# Patient Record
Sex: Male | Born: 1945 | Race: White | Hispanic: No | Marital: Married | Smoking: Former smoker
Health system: Southern US, Community
[De-identification: ages and names within clinical notes are randomized; demographics above are authoritative.]

## PROBLEM LIST (undated history)

## (undated) DIAGNOSIS — R51 Headache: Secondary | ICD-10-CM

## (undated) DIAGNOSIS — R519 Headache, unspecified: Secondary | ICD-10-CM

## (undated) DIAGNOSIS — K5730 Diverticulosis of large intestine without perforation or abscess without bleeding: Secondary | ICD-10-CM

## (undated) DIAGNOSIS — E785 Hyperlipidemia, unspecified: Secondary | ICD-10-CM

## (undated) DIAGNOSIS — M25552 Pain in left hip: Secondary | ICD-10-CM

## (undated) DIAGNOSIS — I2510 Atherosclerotic heart disease of native coronary artery without angina pectoris: Secondary | ICD-10-CM

## (undated) DIAGNOSIS — I219 Acute myocardial infarction, unspecified: Secondary | ICD-10-CM

## (undated) DIAGNOSIS — K219 Gastro-esophageal reflux disease without esophagitis: Secondary | ICD-10-CM

## (undated) DIAGNOSIS — M797 Fibromyalgia: Secondary | ICD-10-CM

## (undated) DIAGNOSIS — R42 Dizziness and giddiness: Secondary | ICD-10-CM

## (undated) DIAGNOSIS — M7918 Myalgia, other site: Secondary | ICD-10-CM

## (undated) DIAGNOSIS — I10 Essential (primary) hypertension: Secondary | ICD-10-CM

## (undated) DIAGNOSIS — K635 Polyp of colon: Secondary | ICD-10-CM

## (undated) DIAGNOSIS — C8590 Non-Hodgkin lymphoma, unspecified, unspecified site: Secondary | ICD-10-CM

## (undated) DIAGNOSIS — C9000 Multiple myeloma not having achieved remission: Secondary | ICD-10-CM

## (undated) DIAGNOSIS — J309 Allergic rhinitis, unspecified: Secondary | ICD-10-CM

## (undated) DIAGNOSIS — G8929 Other chronic pain: Secondary | ICD-10-CM

## (undated) HISTORY — DX: Headache, unspecified: R51.9

## (undated) HISTORY — PX: CARDIAC CATHETERIZATION: SHX172

## (undated) HISTORY — DX: Hyperlipidemia, unspecified: E78.5

## (undated) HISTORY — DX: Myalgia, other site: M79.18

## (undated) HISTORY — DX: Atherosclerotic heart disease of native coronary artery without angina pectoris: I25.10

## (undated) HISTORY — DX: Non-Hodgkin lymphoma, unspecified, unspecified site: C85.90

## (undated) HISTORY — PX: CATARACT EXTRACTION W/ INTRAOCULAR LENS IMPLANT: SHX1309

## (undated) HISTORY — DX: Dizziness and giddiness: R42

## (undated) HISTORY — PX: POPLITEAL SYNOVIAL CYST EXCISION: SUR555

## (undated) HISTORY — DX: Polyp of colon: K63.5

## (undated) HISTORY — DX: Headache: R51

## (undated) HISTORY — DX: Allergic rhinitis, unspecified: J30.9

---
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
## 1994-02-09 HISTORY — PX: LAPAROSCOPIC CHOLECYSTECTOMY: SUR755

## 1998-09-04 ENCOUNTER — Encounter: Payer: Self-pay | Admitting: Internal Medicine

## 1998-09-04 ENCOUNTER — Ambulatory Visit (HOSPITAL_COMMUNITY): Admission: RE | Admit: 1998-09-04 | Discharge: 1998-09-04 | Payer: Self-pay | Admitting: Internal Medicine

## 2001-01-20 ENCOUNTER — Emergency Department (HOSPITAL_COMMUNITY): Admission: EM | Admit: 2001-01-20 | Discharge: 2001-01-20 | Payer: Self-pay | Admitting: Emergency Medicine

## 2001-01-20 ENCOUNTER — Encounter: Payer: Self-pay | Admitting: Emergency Medicine

## 2001-05-25 ENCOUNTER — Encounter: Admission: RE | Admit: 2001-05-25 | Discharge: 2001-05-25 | Payer: Self-pay | Admitting: Internal Medicine

## 2001-05-25 ENCOUNTER — Encounter: Payer: Self-pay | Admitting: Internal Medicine

## 2002-06-26 ENCOUNTER — Encounter: Payer: Self-pay | Admitting: Internal Medicine

## 2002-06-26 ENCOUNTER — Encounter: Admission: RE | Admit: 2002-06-26 | Discharge: 2002-06-26 | Payer: Self-pay | Admitting: Internal Medicine

## 2003-01-24 ENCOUNTER — Ambulatory Visit (HOSPITAL_COMMUNITY): Admission: RE | Admit: 2003-01-24 | Discharge: 2003-01-24 | Payer: Self-pay | Admitting: Gastroenterology

## 2003-01-24 ENCOUNTER — Encounter (INDEPENDENT_AMBULATORY_CARE_PROVIDER_SITE_OTHER): Payer: Self-pay | Admitting: Specialist

## 2003-03-10 ENCOUNTER — Emergency Department (HOSPITAL_COMMUNITY): Admission: EM | Admit: 2003-03-10 | Discharge: 2003-03-10 | Payer: Self-pay | Admitting: Emergency Medicine

## 2005-06-09 DIAGNOSIS — I219 Acute myocardial infarction, unspecified: Secondary | ICD-10-CM

## 2005-06-09 HISTORY — PX: CORONARY ARTERY BYPASS GRAFT: SHX141

## 2005-06-09 HISTORY — DX: Acute myocardial infarction, unspecified: I21.9

## 2005-06-28 ENCOUNTER — Inpatient Hospital Stay (HOSPITAL_COMMUNITY): Admission: EM | Admit: 2005-06-28 | Discharge: 2005-07-04 | Payer: Self-pay | Admitting: Emergency Medicine

## 2005-06-29 ENCOUNTER — Encounter (INDEPENDENT_AMBULATORY_CARE_PROVIDER_SITE_OTHER): Payer: Self-pay | Admitting: Specialist

## 2005-07-30 ENCOUNTER — Encounter (HOSPITAL_COMMUNITY): Admission: RE | Admit: 2005-07-30 | Discharge: 2005-10-28 | Payer: Self-pay | Admitting: Cardiology

## 2005-08-03 ENCOUNTER — Ambulatory Visit: Payer: Self-pay | Admitting: Oncology

## 2005-08-06 LAB — CBC WITH DIFFERENTIAL/PLATELET
BASO%: 1.1 % (ref 0.0–2.0)
Basophils Absolute: 0.1 10*3/uL (ref 0.0–0.1)
EOS%: 6.3 % (ref 0.0–7.0)
Eosinophils Absolute: 0.6 10*3/uL — ABNORMAL HIGH (ref 0.0–0.5)
HCT: 37.1 % — ABNORMAL LOW (ref 38.7–49.9)
HGB: 12.4 g/dL — ABNORMAL LOW (ref 13.0–17.1)
LYMPH%: 28.7 % (ref 14.0–48.0)
MCH: 29.9 pg (ref 28.0–33.4)
MCHC: 33.5 g/dL (ref 32.0–35.9)
MCV: 89.2 fL (ref 81.6–98.0)
MONO#: 0.9 10*3/uL (ref 0.1–0.9)
MONO%: 8.6 % (ref 0.0–13.0)
NEUT#: 5.5 10*3/uL (ref 1.5–6.5)
NEUT%: 55.3 % (ref 40.0–75.0)
Platelets: 303 10*3/uL (ref 145–400)
RBC: 4.16 10*6/uL — ABNORMAL LOW (ref 4.20–5.71)
RDW: 13.6 % (ref 11.2–14.6)
WBC: 9.9 10*3/uL (ref 4.0–10.0)
lymph#: 2.9 10*3/uL (ref 0.9–3.3)

## 2005-08-10 LAB — SPEP & IFE WITH QIG
Albumin ELP: 57.7 % (ref 55.8–66.1)
Alpha-1-Globulin: 4.8 % (ref 2.9–4.9)
Alpha-2-Globulin: 10.1 % (ref 7.1–11.8)
Beta 2: 4.9 % (ref 3.2–6.5)
Beta Globulin: 6.7 % (ref 4.7–7.2)
Gamma Globulin: 15.8 % (ref 11.1–18.8)
IgA: 203 mg/dL (ref 68–378)
IgG (Immunoglobin G), Serum: 1150 mg/dL (ref 694–1618)
IgM, Serum: 93 mg/dL (ref 60–263)
Total Protein, Serum Electrophoresis: 6.7 g/dL (ref 6.0–8.3)

## 2005-08-10 LAB — COMPREHENSIVE METABOLIC PANEL
ALT: 17 U/L (ref 0–40)
AST: 19 U/L (ref 0–37)
Albumin: 4.1 g/dL (ref 3.5–5.2)
Alkaline Phosphatase: 66 U/L (ref 39–117)
BUN: 18 mg/dL (ref 6–23)
CO2: 27 mEq/L (ref 19–32)
Calcium: 8.7 mg/dL (ref 8.4–10.5)
Chloride: 105 mEq/L (ref 96–112)
Creatinine, Ser: 1.11 mg/dL (ref 0.40–1.50)
Glucose, Bld: 92 mg/dL (ref 70–99)
Potassium: 4.4 mEq/L (ref 3.5–5.3)
Sodium: 139 mEq/L (ref 135–145)
Total Bilirubin: 0.4 mg/dL (ref 0.3–1.2)
Total Protein: 6.7 g/dL (ref 6.0–8.3)

## 2005-08-10 LAB — LACTATE DEHYDROGENASE: LDH: 138 U/L (ref 94–250)

## 2005-08-10 LAB — BETA 2 MICROGLOBULIN, SERUM: Beta-2 Microglobulin: 1.86 mg/L — ABNORMAL HIGH (ref 1.01–1.73)

## 2005-08-14 ENCOUNTER — Encounter: Admission: RE | Admit: 2005-08-14 | Discharge: 2005-08-14 | Payer: Self-pay | Admitting: Oncology

## 2005-10-29 ENCOUNTER — Encounter (HOSPITAL_COMMUNITY): Admission: RE | Admit: 2005-10-29 | Discharge: 2006-01-27 | Payer: Self-pay | Admitting: Cardiology

## 2005-11-17 ENCOUNTER — Ambulatory Visit: Payer: Self-pay | Admitting: Oncology

## 2005-11-19 LAB — CBC WITH DIFFERENTIAL/PLATELET
BASO%: 1.1 % (ref 0.0–2.0)
Basophils Absolute: 0.1 10*3/uL (ref 0.0–0.1)
EOS%: 3.9 % (ref 0.0–7.0)
Eosinophils Absolute: 0.3 10*3/uL (ref 0.0–0.5)
HCT: 41.5 % (ref 38.7–49.9)
HGB: 14.2 g/dL (ref 13.0–17.1)
LYMPH%: 28 % (ref 14.0–48.0)
MCH: 28.9 pg (ref 28.0–33.4)
MCHC: 34.2 g/dL (ref 32.0–35.9)
MCV: 84.5 fL (ref 81.6–98.0)
MONO#: 0.8 10*3/uL (ref 0.1–0.9)
MONO%: 9.6 % (ref 0.0–13.0)
NEUT#: 5 10*3/uL (ref 1.5–6.5)
NEUT%: 57.4 % (ref 40.0–75.0)
Platelets: 267 10*3/uL (ref 145–400)
RBC: 4.91 10*6/uL (ref 4.20–5.71)
RDW: 13.8 % (ref 11.2–14.6)
WBC: 8.7 10*3/uL (ref 4.0–10.0)
lymph#: 2.4 10*3/uL (ref 0.9–3.3)

## 2005-11-20 LAB — LACTATE DEHYDROGENASE: LDH: 152 U/L (ref 94–250)

## 2005-11-20 LAB — BETA 2 MICROGLOBULIN, SERUM: Beta-2 Microglobulin: 1.86 mg/L — ABNORMAL HIGH (ref 1.01–1.73)

## 2006-01-09 ENCOUNTER — Emergency Department (HOSPITAL_COMMUNITY): Admission: EM | Admit: 2006-01-09 | Discharge: 2006-01-09 | Payer: Self-pay | Admitting: Emergency Medicine

## 2006-03-16 ENCOUNTER — Ambulatory Visit: Payer: Self-pay | Admitting: Oncology

## 2006-03-18 LAB — BETA 2 MICROGLOBULIN, SERUM: Beta-2 Microglobulin: 2.12 mg/L — ABNORMAL HIGH (ref 1.01–1.73)

## 2006-03-18 LAB — CBC WITH DIFFERENTIAL/PLATELET
BASO%: 0.8 % (ref 0.0–2.0)
Basophils Absolute: 0.1 10*3/uL (ref 0.0–0.1)
EOS%: 2.9 % (ref 0.0–7.0)
Eosinophils Absolute: 0.3 10*3/uL (ref 0.0–0.5)
HCT: 40.9 % (ref 38.7–49.9)
HGB: 14.2 g/dL (ref 13.0–17.1)
LYMPH%: 23.2 % (ref 14.0–48.0)
MCH: 30 pg (ref 28.0–33.4)
MCHC: 34.8 g/dL (ref 32.0–35.9)
MCV: 86 fL (ref 81.6–98.0)
MONO#: 0.8 10*3/uL (ref 0.1–0.9)
MONO%: 8.6 % (ref 0.0–13.0)
NEUT#: 6.3 10*3/uL (ref 1.5–6.5)
NEUT%: 64.5 % (ref 40.0–75.0)
Platelets: 301 10*3/uL (ref 145–400)
RBC: 4.75 10*6/uL (ref 4.20–5.71)
RDW: 12.9 % (ref 11.2–14.6)
WBC: 9.7 10*3/uL (ref 4.0–10.0)
lymph#: 2.3 10*3/uL (ref 0.9–3.3)

## 2006-03-18 LAB — LACTATE DEHYDROGENASE: LDH: 148 U/L (ref 94–250)

## 2006-07-02 ENCOUNTER — Encounter: Admission: RE | Admit: 2006-07-02 | Discharge: 2006-07-02 | Payer: Self-pay | Admitting: Internal Medicine

## 2006-09-14 ENCOUNTER — Ambulatory Visit: Payer: Self-pay | Admitting: Oncology

## 2006-09-16 LAB — CBC WITH DIFFERENTIAL/PLATELET
BASO%: 1.4 % (ref 0.0–2.0)
Basophils Absolute: 0.1 10*3/uL (ref 0.0–0.1)
EOS%: 3.3 % (ref 0.0–7.0)
Eosinophils Absolute: 0.2 10*3/uL (ref 0.0–0.5)
HCT: 39.8 % (ref 38.7–49.9)
HGB: 14 g/dL (ref 13.0–17.1)
LYMPH%: 24.3 % (ref 14.0–48.0)
MCH: 30.8 pg (ref 28.0–33.4)
MCHC: 35.2 g/dL (ref 32.0–35.9)
MCV: 87.5 fL (ref 81.6–98.0)
MONO#: 0.7 10*3/uL (ref 0.1–0.9)
MONO%: 8.8 % (ref 0.0–13.0)
NEUT#: 4.6 10*3/uL (ref 1.5–6.5)
NEUT%: 62.2 % (ref 40.0–75.0)
Platelets: 262 10*3/uL (ref 145–400)
RBC: 4.55 10*6/uL (ref 4.20–5.71)
RDW: 13.1 % (ref 11.2–14.6)
WBC: 7.4 10*3/uL (ref 4.0–10.0)
lymph#: 1.8 10*3/uL (ref 0.9–3.3)

## 2006-09-17 LAB — BETA 2 MICROGLOBULIN, SERUM: Beta-2 Microglobulin: 1.9 mg/L — ABNORMAL HIGH (ref 1.01–1.73)

## 2006-09-17 LAB — COMPREHENSIVE METABOLIC PANEL
ALT: 20 U/L (ref 0–53)
AST: 21 U/L (ref 0–37)
Albumin: 3.6 g/dL (ref 3.5–5.2)
Alkaline Phosphatase: 68 U/L (ref 39–117)
BUN: 23 mg/dL (ref 6–23)
CO2: 25 mEq/L (ref 19–32)
Calcium: 9.1 mg/dL (ref 8.4–10.5)
Chloride: 107 mEq/L (ref 96–112)
Creatinine, Ser: 1.07 mg/dL (ref 0.40–1.50)
Glucose, Bld: 98 mg/dL (ref 70–99)
Potassium: 4.9 mEq/L (ref 3.5–5.3)
Sodium: 141 mEq/L (ref 135–145)
Total Bilirubin: 0.5 mg/dL (ref 0.3–1.2)
Total Protein: 6.6 g/dL (ref 6.0–8.3)

## 2006-09-17 LAB — LACTATE DEHYDROGENASE: LDH: 147 U/L (ref 94–250)

## 2007-03-15 ENCOUNTER — Ambulatory Visit: Payer: Self-pay | Admitting: Oncology

## 2007-03-17 LAB — CBC WITH DIFFERENTIAL/PLATELET
BASO%: 0.5 % (ref 0.0–2.0)
Basophils Absolute: 0 10*3/uL (ref 0.0–0.1)
EOS%: 2.8 % (ref 0.0–7.0)
Eosinophils Absolute: 0.2 10*3/uL (ref 0.0–0.5)
HCT: 44 % (ref 38.7–49.9)
HGB: 14.5 g/dL (ref 13.0–17.1)
LYMPH%: 25.8 % (ref 14.0–48.0)
MCH: 29 pg (ref 28.0–33.4)
MCHC: 33 g/dL (ref 32.0–35.9)
MCV: 87.8 fL (ref 81.6–98.0)
MONO#: 0.7 10*3/uL (ref 0.1–0.9)
MONO%: 8.7 % (ref 0.0–13.0)
NEUT#: 5.1 10*3/uL (ref 1.5–6.5)
NEUT%: 62.2 % (ref 40.0–75.0)
Platelets: 294 10*3/uL (ref 145–400)
RBC: 5.01 10*6/uL (ref 4.20–5.71)
RDW: 13.3 % (ref 11.2–14.6)
WBC: 8.2 10*3/uL (ref 4.0–10.0)
lymph#: 2.1 10*3/uL (ref 0.9–3.3)

## 2007-03-18 LAB — COMPREHENSIVE METABOLIC PANEL
ALT: 22 U/L (ref 0–53)
AST: 26 U/L (ref 0–37)
Albumin: 4.2 g/dL (ref 3.5–5.2)
Alkaline Phosphatase: 57 U/L (ref 39–117)
BUN: 20 mg/dL (ref 6–23)
CO2: 26 mEq/L (ref 19–32)
Calcium: 9 mg/dL (ref 8.4–10.5)
Chloride: 106 mEq/L (ref 96–112)
Creatinine, Ser: 1.03 mg/dL (ref 0.40–1.50)
Glucose, Bld: 80 mg/dL (ref 70–99)
Potassium: 4.3 mEq/L (ref 3.5–5.3)
Sodium: 140 mEq/L (ref 135–145)
Total Bilirubin: 0.6 mg/dL (ref 0.3–1.2)
Total Protein: 6.8 g/dL (ref 6.0–8.3)

## 2007-03-18 LAB — LACTATE DEHYDROGENASE: LDH: 154 U/L (ref 94–250)

## 2007-03-18 LAB — BETA 2 MICROGLOBULIN, SERUM: Beta-2 Microglobulin: 1.75 mg/L — ABNORMAL HIGH (ref 1.01–1.73)

## 2007-07-28 ENCOUNTER — Encounter: Admission: RE | Admit: 2007-07-28 | Discharge: 2007-07-28 | Payer: Self-pay | Admitting: Cardiology

## 2007-11-10 ENCOUNTER — Ambulatory Visit: Payer: Self-pay | Admitting: Oncology

## 2007-11-14 LAB — CBC WITH DIFFERENTIAL/PLATELET
BASO%: 0.3 % (ref 0.0–2.0)
Basophils Absolute: 0 10*3/uL (ref 0.0–0.1)
EOS%: 3 % (ref 0.0–7.0)
Eosinophils Absolute: 0.2 10*3/uL (ref 0.0–0.5)
HCT: 40.4 % (ref 38.7–49.9)
HGB: 13.9 g/dL (ref 13.0–17.1)
LYMPH%: 22.8 % (ref 14.0–48.0)
MCH: 30.5 pg (ref 28.0–33.4)
MCHC: 34.5 g/dL (ref 32.0–35.9)
MCV: 88.4 fL (ref 81.6–98.0)
MONO#: 0.8 10*3/uL (ref 0.1–0.9)
MONO%: 10.4 % (ref 0.0–13.0)
NEUT#: 4.8 10*3/uL (ref 1.5–6.5)
NEUT%: 63.5 % (ref 40.0–75.0)
Platelets: 238 10*3/uL (ref 145–400)
RBC: 4.57 10*6/uL (ref 4.20–5.71)
RDW: 13.4 % (ref 11.2–14.6)
WBC: 7.6 10*3/uL (ref 4.0–10.0)
lymph#: 1.7 10*3/uL (ref 0.9–3.3)

## 2007-11-14 LAB — LACTATE DEHYDROGENASE: LDH: 145 U/L (ref 94–250)

## 2007-11-14 LAB — IGG, IGA, IGM
IgA: 171 mg/dL (ref 68–378)
IgG (Immunoglobin G), Serum: 989 mg/dL (ref 694–1618)
IgM, Serum: 71 mg/dL (ref 60–263)

## 2008-02-10 DIAGNOSIS — K5730 Diverticulosis of large intestine without perforation or abscess without bleeding: Secondary | ICD-10-CM

## 2008-02-10 HISTORY — PX: OTHER SURGICAL HISTORY: SHX169

## 2008-02-10 HISTORY — DX: Diverticulosis of large intestine without perforation or abscess without bleeding: K57.30

## 2008-03-20 ENCOUNTER — Inpatient Hospital Stay (HOSPITAL_BASED_OUTPATIENT_CLINIC_OR_DEPARTMENT_OTHER): Admission: RE | Admit: 2008-03-20 | Discharge: 2008-03-20 | Payer: Self-pay | Admitting: Cardiology

## 2008-08-21 ENCOUNTER — Ambulatory Visit: Payer: Self-pay | Admitting: Oncology

## 2008-08-23 LAB — CBC WITH DIFFERENTIAL/PLATELET
BASO%: 1.1 % (ref 0.0–2.0)
Basophils Absolute: 0.1 10*3/uL (ref 0.0–0.1)
EOS%: 2.8 % (ref 0.0–7.0)
Eosinophils Absolute: 0.2 10*3/uL (ref 0.0–0.5)
HCT: 38.4 % (ref 38.4–49.9)
HGB: 13.1 g/dL (ref 13.0–17.1)
LYMPH%: 21.2 % (ref 14.0–49.0)
MCH: 31 pg (ref 27.2–33.4)
MCHC: 34 g/dL (ref 32.0–36.0)
MCV: 91 fL (ref 79.3–98.0)
MONO#: 0.6 10*3/uL (ref 0.1–0.9)
MONO%: 10.3 % (ref 0.0–14.0)
NEUT#: 3.9 10*3/uL (ref 1.5–6.5)
NEUT%: 64.6 % (ref 39.0–75.0)
Platelets: 212 10*3/uL (ref 140–400)
RBC: 4.22 10*6/uL (ref 4.20–5.82)
RDW: 13.1 % (ref 11.0–14.6)
WBC: 6.1 10*3/uL (ref 4.0–10.3)
lymph#: 1.3 10*3/uL (ref 0.9–3.3)

## 2009-05-21 ENCOUNTER — Ambulatory Visit: Payer: Self-pay | Admitting: Oncology

## 2009-05-23 LAB — CBC WITH DIFFERENTIAL/PLATELET
BASO%: 0.5 % (ref 0.0–2.0)
Basophils Absolute: 0 10*3/uL (ref 0.0–0.1)
EOS%: 2.7 % (ref 0.0–7.0)
Eosinophils Absolute: 0.2 10*3/uL (ref 0.0–0.5)
HCT: 41.5 % (ref 38.4–49.9)
HGB: 14.2 g/dL (ref 13.0–17.1)
LYMPH%: 21.3 % (ref 14.0–49.0)
MCH: 31.5 pg (ref 27.2–33.4)
MCHC: 34.2 g/dL (ref 32.0–36.0)
MCV: 92 fL (ref 79.3–98.0)
MONO#: 0.8 10*3/uL (ref 0.1–0.9)
MONO%: 10.9 % (ref 0.0–14.0)
NEUT#: 4.5 10*3/uL (ref 1.5–6.5)
NEUT%: 64.6 % (ref 39.0–75.0)
Platelets: 247 10*3/uL (ref 140–400)
RBC: 4.51 10*6/uL (ref 4.20–5.82)
RDW: 12.7 % (ref 11.0–14.6)
WBC: 7 10*3/uL (ref 4.0–10.3)
lymph#: 1.5 10*3/uL (ref 0.9–3.3)

## 2009-12-09 ENCOUNTER — Ambulatory Visit: Payer: Self-pay | Admitting: Cardiology

## 2010-05-22 ENCOUNTER — Other Ambulatory Visit: Payer: Self-pay | Admitting: Oncology

## 2010-05-22 ENCOUNTER — Encounter (HOSPITAL_BASED_OUTPATIENT_CLINIC_OR_DEPARTMENT_OTHER): Payer: Medicare Other | Admitting: Oncology

## 2010-05-22 DIAGNOSIS — I2510 Atherosclerotic heart disease of native coronary artery without angina pectoris: Secondary | ICD-10-CM

## 2010-05-22 DIAGNOSIS — D649 Anemia, unspecified: Secondary | ICD-10-CM

## 2010-05-22 DIAGNOSIS — Z23 Encounter for immunization: Secondary | ICD-10-CM

## 2010-05-22 DIAGNOSIS — C8299 Follicular lymphoma, unspecified, extranodal and solid organ sites: Secondary | ICD-10-CM

## 2010-05-22 LAB — CBC WITH DIFFERENTIAL/PLATELET
BASO%: 0.5 % (ref 0.0–2.0)
Basophils Absolute: 0 10*3/uL (ref 0.0–0.1)
EOS%: 2.1 % (ref 0.0–7.0)
Eosinophils Absolute: 0.2 10*3/uL (ref 0.0–0.5)
HCT: 42 % (ref 38.4–49.9)
HGB: 14.3 g/dL (ref 13.0–17.1)
LYMPH%: 18.7 % (ref 14.0–49.0)
MCH: 30.5 pg (ref 27.2–33.4)
MCHC: 34 g/dL (ref 32.0–36.0)
MCV: 89.7 fL (ref 79.3–98.0)
MONO#: 0.8 10*3/uL (ref 0.1–0.9)
MONO%: 9.6 % (ref 0.0–14.0)
NEUT#: 5.7 10*3/uL (ref 1.5–6.5)
NEUT%: 69.1 % (ref 39.0–75.0)
Platelets: 236 10*3/uL (ref 140–400)
RBC: 4.68 10*6/uL (ref 4.20–5.82)
RDW: 13.1 % (ref 11.0–14.6)
WBC: 8.2 10*3/uL (ref 4.0–10.3)
lymph#: 1.5 10*3/uL (ref 0.9–3.3)

## 2010-06-04 ENCOUNTER — Other Ambulatory Visit: Payer: Self-pay | Admitting: Nurse Practitioner

## 2010-06-04 DIAGNOSIS — E785 Hyperlipidemia, unspecified: Secondary | ICD-10-CM

## 2010-06-04 NOTE — Telephone Encounter (Signed)
Fax received from pharmacy. Refill completed. Jodette Diontre Harps RN  

## 2010-06-24 NOTE — H&P (Signed)
Joseph Soto, STANKE              ACCOUNT NO.:  [id number]   MEDICAL RECORD NO.:  [id number]            PATIENT TYPE:   LOCATION:                                 FACILITY:   PHYSICIAN:  Colleen Can. Deborah Chalk, M.D.    DATE OF BIRTH:   DATE OF ADMISSION:  03/20/2008  DATE OF DISCHARGE:                              HISTORY & PHYSICAL   CHIEF COMPLAINT:  None.   HISTORY OF PRESENT ILLNESS:  Mr. Casanova is a 65 year old white male who  is referred for diagnostic cardiac catheterization.  He was seen in the  office for his followup visit towards the middle part of January, 2010.  At that time, he was really doing well without complaints.  We proceeded  on with a stress Cardiolite study which was performed on March 12, 2008.  He exercised on the standard Bruce protocol for a total of 12  minutes.  He had excellent exercise tolerance with an adequate blood  pressure response.  Clinically, he had no complaints.  His EKG, however,  showed diffuse ST-segment depression.  He had very frequent PACs, PVCs,  as well as runs of bigeminy.  He was also noted to have inferior  ischemia on his Cardiolite images.  He is now referred for cardiac  catheterization.  He has had no complaints of chest pain.   PAST MEDICAL HISTORY:  1. Subendocardial myocardial infarction.  He had previous PCI to the      LAD and subsequently had emergent coronary artery bypass grafting      x5 in May 2007 per Dr. Charlett Lango.  At that time, he had      left internal mammary to the LAD, saphenous vein graft to the first      diagonal and obtuse marginal, sequential saphenous vein graft to      the posterior descending and posterolateral branches.  2. Hyperlipidemia.  3. Tobacco abuse, resolved in 2007.  4. History of non-Hodgkin's lymphoma, followed by Dr. Truett Perna.  5. Remote history of anemia.  6. Hyperlipidemia.  7. History of mild LV dysfunction.  His last 2-D echocardiogram was in      February 2008 which  showed ejection fraction of 45% to 50%.  8. Remote history of tonsillectomy.  9. Remote cholecystectomy.   ALLERGIES:  None.   CURRENT MEDICINES:  1. Astelin daily.  2. Zocor 40 mg a day.  3. Aspirin 325 a day.  4. Flonase daily.  5. Zyrtec daily.  6. Allegra daily.  7. Metoprolol 25 daily.  8. Multivitamin daily.  9. Niaspan 500 mg at bedtime.   FAMILY HISTORY:  His father died at 11 with heart failure and had had  previous heart attack.  Mother died of natural causes in her early 75s.   SOCIAL HISTORY:  He is married.  He has 2 children.  He was employed in  recruiting.  He used to smoke 1-2 packs a day but stopped approximately  3 years ago.  He has social alcohol use.   REVIEW OF SYSTEMS:  He denies chest pain, shortness of breath or  lightheadedness.  He is bothered by seasonal allergies.  He has had no  recent fever, flu or cough, and all other review of systems is negative.   PHYSICAL EXAMINATION:  GENERAL:  He is pleasant.  He is somewhat  anxious.  He is in no acute distress.  VITAL SIGNS:  His weight is 195 pounds, blood pressure 130/80, heart  rate 80 and regular, respirations 18.  He is afebrile.  SKIN:  Warm and dry.  Color is unremarkable.  HEENT/NECK:  Normocephalic, atraumatic.  Pupils are equal and reactive.  Neck is supple.  No masses.  No JVD.  Conjunctivae are clear.  LUNGS:  Clear.  Not dyspneic.  CARDIAC:  Shows a regular rhythm.  No murmur, rub or gallop.  ABDOMEN:  Soft, positive bowel sounds, nontender.  EXTREMITIES:  Without edema.  Range of motion is normal.  MUSCULOSKELETAL:  Strength is normal, and gait is intact.  NEUROLOGIC:  Shows no gross focal deficits.   Pertinent labs are pending.   OVERALL IMPRESSION:  1. Abnormal Cardiolite study.  2. Known history of ischemic heart disease with remote subendocardial      myocardial infarction and subsequent emergent coronary artery      bypass grafting in May 2007.  3. Hyperlipidemia.  4.  History of non-Hodgkin's lymphoma.   PLAN:  Will proceed on with repeat cardiac catheterization.  The  procedure has been reviewed in full detail, and he is willing to proceed  on Tuesday, March 20, 2008.      Sharlee Blew, N.P.      Colleen Can. Deborah Chalk, M.D.  Electronically Signed    LC/MEDQ  D:  03/19/2008  T:  03/19/2008  Job:  [id number]

## 2010-06-24 NOTE — Cardiovascular Report (Signed)
Joseph Soto, Joseph Soto              ACCOUNT NO.:  [id number]   MEDICAL RECORD NO.:  [id number]          PATIENT TYPE:  OIB   LOCATION:  1967                         FACILITY:  [facility]   PHYSICIAN:  Colleen Can. Deborah Chalk, M.D.DATE OF BIRTH:  January 26, 1946   DATE OF PROCEDURE:  03/20/2008  DATE OF DISCHARGE:  03/20/2008                            CARDIAC CATHETERIZATION   INDICATION FOR PROCEDURE:  Abnormal stress Cardiolite study, post-bypass  surgery.   PROCEDURES:  Left heart catheterization with selective coronary  angiography, left ventricular angiography, saphenous vein graft  angiography x2, and angiography of the left internal mammary artery.   TYPE AND SITE OF ENTRY:  Percutaneous right femoral artery.   CATHETERS:  4-French pigtail left ventricular angiographic catheter, 5-  curved 4-French left coronary catheter, 3-DRC right coronary catheter, a  right coronary artery bypass graft catheter and a left coronary artery  bypass graft are both 4-French.   CONTRAST MATERIAL:  Omnipaque.   MEDICATIONS GIVEN PRIOR TO PROCEDURE:  Valium 10 mg.   MEDICATIONS GIVEN DURING PROCEDURE:  Versed 2 mg IV.   COMMENT:  The patient tolerated the procedure well.   HEMODYNAMIC DATA:  The aortic pressure was 101/76 and LV was 105/17.  There is no aortic valve gradient.  There was on pullback.   ANGIOGRAPHIC DATA:  Left ventricular angiogram was performed in the RAO  position.  Overall cardiac size and silhouette were normal.  Global  ejection fraction was 55-60%.   CORONARY ARTERIES:  1. Left main coronary artery has mild irregularities, but no      significant focal stenosis.   1. The left anterior descending has a 70% stenosis proximally.  There      arises a moderate diagonal vessel that has a 90% stenosis.  Further      down the left anterior descending, there is a 95% focal stenosis.      There is a diagonal vessel that fills by bidirectional flow as well      as bidirectional flow into  the distal left anterior descending with      internal mammary graft.   1. Left circumflex.  Left circumflex bifurcates almost at its ostium.      The obtuse marginal was a previously bypass graft and it has 30-40%      narrowing.  It has good distal runoff.  The circumflex branch      proceeds in the AV groove has an ostial 70% stenosis.   1. The right coronary artery is a large dominant vessel.  There is a      30-40% scattered narrowing before the crux and 50% narrowing just      before the crux.  There is a 70% ostial narrowing in the second      posterior descending and a 95% stenosis in the midportion of the      small vessels.  There is 30-40% stenosis in the first posterior      descending.  There is 80-90% stenosis in ostial portion of the      small RV branch.   1. Saphenous vein graft to posterior  descending and posterolateral      branch is occluded proximally.   1. Saphenous vein graft to the intermediate diagonal vessel was      occluded proximally.   1. The left internal mammary artery to the LAD is patent with      excellent flow.   OVERALL IMPRESSION:  1. Normal left ventricular function.  2. Three-vessel coronary atherosclerosis as described above.  3. Patent left internal mammary artery to the left anterior descending      with occluded saphenous vein graft to the intermediate diagonal      sequentially and an occluded saphenous vein graft to posterior      descending and posterolateral branch sequentially.   DISCUSSION:  In general, Mr. Vertz has multiple small vessels.  Most  of the branches that are in the 2 mm range at most with proximal vessels  being not much more than 2.5 mm in diameter.  The diagonal vessel is of  concern because there is stenosis in the left anterior descending both  before and after the diagonal, and there is a severe stenosis in the  diagonal vessel.  However, I do not think that vessel would be suitable  for stent placement.  He  does have stenosis in the ostial branch of the  circumflex that runs in the AV groove, but this would also be a very  difficult vessel to perform suitable angioplasty.  The findings in the  right coronary artery involve diffuse distal disease and would almost  certainly be best managed medically.   At this point in time, I think the best course of action will be medical  management.      Colleen Can. Deborah Chalk, M.D.  Electronically Signed     SNT/MEDQ  D:  03/20/2008  T:  03/21/2008  Job:  [id number]

## 2010-06-27 NOTE — Discharge Summary (Signed)
Joseph Soto              ACCOUNT NO.:  [id number]   MEDICAL RECORD NO.:  [id number]          PATIENT TYPE:  INP   LOCATION:  2013                         FACILITY:  [facility]   PHYSICIAN:  Salvatore Decent. Dorris Fetch, M.D.DATE OF BIRTH:  [birth year]   DATE OF ADMISSION:  06/28/2005  DATE OF DISCHARGE:                                 DISCHARGE SUMMARY   PRIMARY ADMITTING DIAGNOSIS:  Chest pain.   ADDITIONAL DISCHARGE DIAGNOSES:  1.  Severe three-vessel coronary artery disease.  2.  Acute subendocardial myocardial infarction.  3.  Hyperlipidemia.  4.  History of tobacco abuse.   PROCEDURES PERFORMED:  1.  Cardiac catheterization.  2.  Placement of intra-aortic balloon pump.  3.  PTCA of occluded proximal LAD.  4.  Emergency coronary artery bypass grafting x5 (left internal mammary      artery to the LAD, sequential saphenous vein graft to the first obtuse      marginal, sequential saphenous vein graft to the posterior descending      and posterolateral).  5.  Endoscopic vein harvest right leg.   HISTORY:  The patient is a 65 year old white male who developed chest pain  on the date of this admission.  The discomfort started early in the morning  at rest and occurred intermittently throughout the morning.  Ultimately he  presented to the emergency department and was found to have elevation of his  cardiac enzymes as well as EKG changes consistent with an acute myocardial  infarction.  Because of these findings, he was admitted for complete cardiac  workup.   HOSPITAL COURSE:  The patient was admitted and was seen by Dr. Deborah Chalk.  He  ultimately ruled in for a non-Q-wave myocardial infarction.  He was started  on IV nitroglycerin, heparin and Integrilin and was scheduled for cardiac  catheterization the next day, however, he continued to have ongoing chest  pain which was unrelieved by IV morphine and IV nitroglycerin.  He was taken  to the cath lab emergently and was found to  have severe three-vessel  coronary artery disease including total occlusion of the LAD which was  supplied via collaterals.  An intra-aortic balloon pump was placed with  complete resolution of his chest pain.  He was returned to the CCU for  further observation however that evening he developed recurrent chest pain  and was returned to the cardiac cath lab where he underwent a PTCA of the  LAD to reestablish flow to the vessel.  Despite re-establishment of flow and  the presence of the intra-aortic balloon pump he continued to have chest  pain.  A cardiothoracic surgery consultation was obtained and the patient  was seen by Dr. Charlett Lango.  Because of his ongoing chest pain in  spite of all measures, he felt that the patient should be taken emergently  to the operating room for coronary artery bypass grafting.  He underwent  emergency CABG x5 as described in detail above.  He tolerated the procedure  well and was transferred to the SICU in stable condition.  He was able to be  extubated shortly  after surgery.  He was hemodynamically stable and doing  well on postop day #1.  Over the course of his first postoperative day, his  intra-aortic balloon pump was weaned and discontinued; also, his IV drips  were weaned and discontinued.  He required a transfusion of packed red blood  cells for acute blood loss anemia.  He remained in the unit until postop day  #2 and at that point he was stable and ready for transfer to the floor.  At  that time all hemodynamic monitoring devices and chest tubes had been  removed.  Since that time he has made slow but steady progress.  He has been  ambulating in the hallways with cardiac rehab phase I and is making good  progress.  He has been quite volume overloaded and is undergoing diuresis  presently.  He is back down to within 3 pounds of his preoperative weight  and is progressing well.  His blood pressure is starting to trend upward and  he has been  started on a beta blocker and an ACE inhibitor.  He developed a  low-grade fever and preoperative urine culture was positive for  enterococcus.  A repeat urine culture is pending at this time but he has  been started empirically on antibiotics.  He otherwise has remained afebrile  and vital signs are stable.  His surgical incision sites are all healing  well.  He is tolerating a regular diet and is having normal bowel and  bladder function.  His most recent labs show hemoglobin of 8.6, hematocrit  25.5, white count 14.3, platelets 173, sodium 134, potassium 3.8 which has  been replaced, BUN 18, creatinine 0.9.  He will have repeat labs on the  morning of Jul 04, 2005.  It is anticipated if he has remained stable over  that period of time and no acute changes have occurred he will hopefully be  ready for discharge home on Jul 04, 2005.   DISCHARGE MEDICATIONS AS FOLLOWS:  1.  Aspirin 325 mg daily.  2.  Lopressor 25 mg b.i.d.  3.  Altace 2.5 mg daily.  4.  Zocor 20 mg q.h.s.  5.  Nu-Iron 150 mg daily.  6.  Lasix 40 mg daily x3 days.  7.  K-Dur 20 mEq daily x3 days.  8.  Avelox 400 mg daily x1 more dose.  9.  Allegra b.i.d.  10. Tylox one to two q.4-6 h p.r.n. for pain.   DISCHARGE INSTRUCTIONS:  He is asked to refrain from driving, heavy lifting  or strenuous activity.  He may continue ambulating daily and using his  incentive spirometer.  He may shower daily and clean his incisions with soap  and water.  He will continue a low-fat, low-sodium diet.   DISCHARGE FOLLOWUP:  He will need to make an appointment to see Dr. Deborah Chalk  in 2 weeks and will have a chest x-ray at that visit.  He will see Dr.  Dorris Fetch on June 21 at 11 a.m.  He is asked to bring his chest x-ray to  this appointment for Dr. Dorris Fetch to review.  In the interim if he  experiences any problems or has questions he is asked to contact our office.      Coral Ceo, P.A.     ______________________________ Salvatore Decent Dorris Fetch, M.D.    GC/MEDQ  D:  07/03/2005  T:  07/03/2005  Job:  [id number]   cc:   Antony Madura, M.D.  Fax: [phone]  Colleen Can. Deborah Chalk, M.D.  Fax: [phone]

## 2010-06-27 NOTE — Op Note (Signed)
NAMEJHONNIE, Joseph Soto              ACCOUNT NO.:  [id number]   MEDICAL RECORD NO.:  [id number]          PATIENT TYPE:  INP   LOCATION:  2316                         FACILITY:  [facility]   PHYSICIAN:  Quita Skye. Krista Blue, M.D.  DATE OF BIRTH:  Oct 18, 1945   DATE OF PROCEDURE:  06/29/2005  DATE OF DISCHARGE:                                 OPERATIVE REPORT   PROCEDURE PERFORMED:  Transesophageal echocardiogram.   Mr. [name] is a 65 year old white male who presents to the operating  room for emergent coronary artery bypass grafting.  The patient had a known  left main disease, was on a balloon pump in the intensive care unit and  remained symptomatic with chest pain.  Dr. Dorris Fetch brought the patient  to the operating room for bypass surgery.  The patient underwent a routine  cardiac induction and following intubation, the transesophageal echo probe  was covered with a latex-free sheath and inserted into the patient's  esophagus for cardiac imaging.  The images obtained with the sonar site were  very poor quality so the transesophageal probe was removed and replaced  without the sheath and the images were much better.  Overall images of the  heart showed no evidence of pericardial effusion or masses.  The right  atrium was normal in size without interatrial septal defect.  The tricuspid  valve had trace regurgitation and the right ventricle had good contractility  without evidence of thrombus or masses.  The left atrium was normal in size  without evidence of thrombus in the atrium or appendage.  Pulmonary vein  flows were forward without evidence of reversal of flow.  The mitral valve  had trace regurgitation but overall structure of the mitral valve was  normal. The left ventricle had some slightly decreased contractility in the  lateral wall but overall had normal left ventricular function at this time.  The aortic valve opened widely.  There was no evidence of aortic stenosis.  The  patient underwent coronary artery bypass grafting and separated  successfully from the bypass machine.  Follow-up evaluation demonstrated no  change in the valvular function and good contractility, status post surgery.  The transesophageal probe was then carefully removed and the patient was  taken to the SICU in good condition.           ______________________________  Quita Skye Krista Blue, M.D.     JDS/MEDQ  D:  06/29/2005  T:  06/30/2005  Job:  [id number]

## 2010-08-01 ENCOUNTER — Telehealth: Payer: Self-pay | Admitting: Cardiovascular Disease

## 2010-08-01 NOTE — Telephone Encounter (Signed)
lmtcb

## 2010-08-01 NOTE — Telephone Encounter (Signed)
Patient is requesting fasting labs when he comes to see Dr. Clifton James. Labs ordered.

## 2010-08-01 NOTE — Telephone Encounter (Signed)
Pt would like lab work down prior to visit

## 2010-08-09 ENCOUNTER — Other Ambulatory Visit: Payer: Self-pay | Admitting: Cardiology

## 2010-08-11 NOTE — Telephone Encounter (Signed)
Med refill

## 2010-08-16 ENCOUNTER — Other Ambulatory Visit: Payer: Self-pay | Admitting: Cardiology

## 2010-08-18 ENCOUNTER — Telehealth: Payer: Self-pay | Admitting: Cardiology

## 2010-08-18 NOTE — Telephone Encounter (Signed)
Patient needs script for ranexia.  Would like before noon if possible.  He is to follow up with mcalhany, but has not been seen yet.  Please call in script.

## 2010-08-18 NOTE — Telephone Encounter (Signed)
Med refill

## 2010-08-20 ENCOUNTER — Encounter: Payer: Self-pay | Admitting: Cardiovascular Disease

## 2010-08-21 ENCOUNTER — Ambulatory Visit: Payer: Medicare Other | Admitting: Cardiovascular Disease

## 2010-08-21 ENCOUNTER — Telehealth: Payer: Self-pay | Admitting: Cardiology

## 2010-08-21 ENCOUNTER — Other Ambulatory Visit (INDEPENDENT_AMBULATORY_CARE_PROVIDER_SITE_OTHER): Payer: Medicare Other | Admitting: *Deleted

## 2010-08-21 ENCOUNTER — Ambulatory Visit (INDEPENDENT_AMBULATORY_CARE_PROVIDER_SITE_OTHER): Payer: Medicare Other | Admitting: Cardiovascular Disease

## 2010-08-21 ENCOUNTER — Encounter: Payer: Self-pay | Admitting: Cardiovascular Disease

## 2010-08-21 VITALS — BP 118/68 | HR 70 | Resp 16 | Ht 71.0 in | Wt 183.0 lb

## 2010-08-21 DIAGNOSIS — E785 Hyperlipidemia, unspecified: Secondary | ICD-10-CM

## 2010-08-21 DIAGNOSIS — I255 Ischemic cardiomyopathy: Secondary | ICD-10-CM

## 2010-08-21 DIAGNOSIS — I2589 Other forms of chronic ischemic heart disease: Secondary | ICD-10-CM

## 2010-08-21 DIAGNOSIS — I2510 Atherosclerotic heart disease of native coronary artery without angina pectoris: Secondary | ICD-10-CM

## 2010-08-21 LAB — HEPATIC FUNCTION PANEL
ALT: 24 U/L (ref 0–53)
AST: 30 U/L (ref 0–37)
Albumin: 4.2 g/dL (ref 3.5–5.2)
Alkaline Phosphatase: 44 U/L (ref 39–117)
Bilirubin, Direct: 0.1 mg/dL (ref 0.0–0.3)
Total Bilirubin: 0.8 mg/dL (ref 0.3–1.2)
Total Protein: 6.6 g/dL (ref 6.0–8.3)

## 2010-08-21 LAB — LIPID PANEL
Cholesterol: 251 mg/dL — ABNORMAL HIGH (ref 0–200)
HDL: 52.5 mg/dL (ref >39.00)
Total CHOL/HDL Ratio: 5
Triglycerides: 64 mg/dL (ref 0.0–149.0)
VLDL: 12.8 mg/dL (ref 0.0–40.0)

## 2010-08-21 LAB — LDL CHOLESTEROL, DIRECT: Direct LDL: 198.4 mg/dL

## 2010-08-21 NOTE — Assessment & Plan Note (Signed)
No recent assessment of LVEF. Will arrange Echo.

## 2010-08-21 NOTE — Assessment & Plan Note (Signed)
Will continue statin and check fasting lipids and LFTS today.

## 2010-08-21 NOTE — Progress Notes (Signed)
History of Present Illness:65 yo WM with history of CAD, NHL, HLD here today for cardiac follow up. He has been followed in the past by Dr. Deborah Chalk. Last cath February 2010 with occluded SVG to Diagonal and SVG to PDA were occluded. LIMA to LAD patent. There was diffuse disease in distal vessels felt best to be managed medically. HIs CABG was in May 2007 by Dr. Dorris Fetch.  Last stress test was years ago. He has had no chest pain. His breathing has been ok. His lipids are followed in our office. Overall doing well. Plays golf 3-4 times per week.   Past Medical History  Diagnosis Date  . Ischemic heart disease   . HLD (hyperlipidemia)   . Non Hodgkin's lymphoma   . Dizziness     1 episode     Past Surgical History  Procedure Date  . Coronary artery bypass graft 5/07    x5. LV dysfunction.   . Cholecystectomy   . Tonsillectomy   . Baker cyst removal     Current Outpatient Prescriptions  Medication Sig Dispense Refill  . aspirin 81 MG tablet Take 81 mg by mouth daily.        . clopidogrel (PLAVIX) 75 MG tablet Take 75 mg by mouth daily.        Marland Kitchen co-enzyme Q-10 30 MG capsule Take 30 mg by mouth 3 (three) times daily.        . fish oil-omega-3 fatty acids 1000 MG capsule Take 2 g by mouth daily.        . L-ARGININE PO Take by mouth daily.        . metoprolol tartrate (LOPRESSOR) 25 MG tablet Take 25 mg by mouth daily.        . Multiple Vitamin (MULTIVITAMIN) capsule Take 1 capsule by mouth daily.        Marland Kitchen RANEXA 500 MG 12 hr tablet EVERY DAY  90 tablet  0  . simvastatin (ZOCOR) 20 MG tablet Take 20 mg by mouth at bedtime.        . Triprolidine-Pseudoephedrine (ANTIHISTAMINE PO) Take by mouth daily.          Allergies  Allergen Reactions  . Niaspan (Niacin (Antihyperlipidemic))     History   Social History  . Marital Status: Married    Spouse Name: N/A    Number of Children: N/A  . Years of Education: N/A   Occupational History  . Conservation officer, [employer]    Social History  Main Topics  . Smoking status: Former Games developer  . Smokeless tobacco: Not on file   Comment: quit in 2007   . Alcohol Use: Not on file  . Drug Use: Not on file  . Sexually Active: Not on file   Other Topics Concern  . Not on file   Social History Narrative   Married, 2 children.Runs an executive recruiting company.     Family History  Problem Relation Age of Onset  . Heart disease Father   . Heart attack Father     Review of Systems:  As stated in the HPI and otherwise negative.   BP 118/68  Pulse 70  Resp 16  Ht 5\' 11"  (1.803 m)  Wt 183 lb (83.008 kg)  BMI 25.52 kg/m2  Physical Examination: General: Well developed, well nourished, NAD HEENT: OP clear, mucus membranes moist SKIN: warm, dry. No rashes. Neuro: No focal deficits Musculoskeletal: Muscle strength 5/5 all ext Psychiatric: Mood and affect normal Neck: No JVD, no carotid bruits,  no thyromegaly, no lymphadenopathy. Lungs:Clear bilaterally, no wheezes, rhonci, crackles Cardiovascular: Regular rate and rhythm. No murmurs, gallops or rubs. Abdomen:Soft. Bowel sounds present. Non-tender.  Extremities: No lower extremity edema. Pulses are 2 + in the bilateral DP/PT.  EKG:NSR, rate 69 bpm.

## 2010-08-21 NOTE — Patient Instructions (Signed)
Your physician wants you to follow-up in: 6 months with Dr. Clifton James.  You will receive a reminder letter in the mail two months in advance. If you don't receive a letter, please call our office to schedule the follow-up appointment. . Your physician has requested that you have an echocardiogram. Echocardiography is a painless test that uses sound waves to create images of your heart. It provides your doctor with information about the size and shape of your heart and how well your heart's chambers and valves are working. This procedure takes approximately one hour. There are no restrictions for this procedure.   You had lab work done today.

## 2010-08-21 NOTE — Assessment & Plan Note (Signed)
Stable. Continue current meds. He is not interested in having SL NTG.

## 2010-08-26 ENCOUNTER — Telehealth: Payer: Self-pay | Admitting: Cardiovascular Disease

## 2010-08-26 NOTE — Telephone Encounter (Signed)
lmtcb

## 2010-08-26 NOTE — Telephone Encounter (Signed)
Pt wants to know the particle size of blood drawn that was done.

## 2010-08-26 NOTE — Telephone Encounter (Signed)
Advised patient that we only did fasting blood work & would need to order a lipo science in order to look at particle size. He would like to consider this in the future.

## 2010-09-01 ENCOUNTER — Ambulatory Visit (HOSPITAL_COMMUNITY): Payer: Medicare Other | Attending: Cardiovascular Disease | Admitting: Radiology

## 2010-09-01 DIAGNOSIS — I255 Ischemic cardiomyopathy: Secondary | ICD-10-CM

## 2010-09-01 DIAGNOSIS — I2589 Other forms of chronic ischemic heart disease: Secondary | ICD-10-CM

## 2010-09-01 DIAGNOSIS — E785 Hyperlipidemia, unspecified: Secondary | ICD-10-CM | POA: Insufficient documentation

## 2010-09-01 DIAGNOSIS — I2510 Atherosclerotic heart disease of native coronary artery without angina pectoris: Secondary | ICD-10-CM

## 2010-09-01 DIAGNOSIS — I379 Nonrheumatic pulmonary valve disorder, unspecified: Secondary | ICD-10-CM | POA: Insufficient documentation

## 2010-09-01 DIAGNOSIS — I079 Rheumatic tricuspid valve disease, unspecified: Secondary | ICD-10-CM | POA: Insufficient documentation

## 2010-09-05 ENCOUNTER — Telehealth: Payer: Self-pay | Admitting: Cardiovascular Disease

## 2010-09-05 NOTE — Telephone Encounter (Signed)
Pt calling re discussing crestor

## 2010-09-05 NOTE — Telephone Encounter (Signed)
Will leave samples at the front desk.  

## 2010-09-08 NOTE — Telephone Encounter (Signed)
No note required for this encounter.

## 2010-09-25 ENCOUNTER — Telehealth: Payer: Self-pay | Admitting: Cardiovascular Disease

## 2010-09-25 MED ORDER — ROSUVASTATIN CALCIUM 40 MG PO TABS
ORAL_TABLET | ORAL | Status: DC
Start: 2010-09-25 — End: 2011-03-25

## 2010-09-25 NOTE — Telephone Encounter (Signed)
Prescription sent for Crestor 20 mg daily. Patient wanted the 40 mg tablets.

## 2010-09-25 NOTE — Telephone Encounter (Signed)
Pt needs to get some meds called in and he doesn't know what he needs

## 2010-10-14 ENCOUNTER — Other Ambulatory Visit: Payer: Medicare Other | Admitting: *Deleted

## 2010-11-15 ENCOUNTER — Other Ambulatory Visit: Payer: Self-pay | Admitting: Cardiology

## 2010-11-17 ENCOUNTER — Telehealth: Payer: Self-pay | Admitting: Cardiovascular Disease

## 2010-11-17 NOTE — Telephone Encounter (Signed)
Pt calling re renexa er 500 mg, uses [facility], pt out needs asap, requesting samples if not called in today

## 2010-11-17 NOTE — Telephone Encounter (Signed)
Called pharmacy med has already been sent in today

## 2010-12-01 ENCOUNTER — Telehealth: Payer: Self-pay | Admitting: Cardiovascular Disease

## 2010-12-01 DIAGNOSIS — I2510 Atherosclerotic heart disease of native coronary artery without angina pectoris: Secondary | ICD-10-CM

## 2010-12-01 MED ORDER — ASPIRIN 325 MG PO TBEC: 325.0000 mg | DELAYED_RELEASE_TABLET | Freq: Every day | ORAL | Status: DC

## 2010-12-01 NOTE — Telephone Encounter (Signed)
The rash could be related to his Plavix. He can stop his Plavix. Would take ASA 325 mg po Qdaily. Thanks, chris

## 2010-12-01 NOTE — Telephone Encounter (Signed)
Pt calling c/o bad bad rash for a couple of months now. Pt said people at [facility] think it might be the Plavix causing it. Pt said he accidentally missed a day a plavix and noticed he didn't have any new outbreaks. Pt wants to know if there are any alternatives to plavix. Please call back to discuss further.

## 2010-12-01 NOTE — Telephone Encounter (Signed)
Spoke with pt. He reports pimple like rash that he has had since last winter which is present over most of body--legs, arms, back and chest. Has seen dermatologist for this and was treated with antibiotic and cortisone cream with little improvement.  Pt takes Plavix 4 times per week and recently missed a day and noticed he did not have any new eruptions.  He is questioning if this rash could be related to Plavix.  Will review with Dr. Clifton James.

## 2010-12-01 NOTE — Telephone Encounter (Signed)
Spoke with pt and gave him recommendations from Dr. Clifton James.

## 2010-12-01 NOTE — Telephone Encounter (Signed)
Addended by: Dossie Arbour on: 12/01/2010 05:49 PM   Modules accepted: Orders

## 2011-03-13 ENCOUNTER — Encounter: Payer: Self-pay | Admitting: Cardiovascular Disease

## 2011-03-13 ENCOUNTER — Ambulatory Visit (INDEPENDENT_AMBULATORY_CARE_PROVIDER_SITE_OTHER): Payer: Medicare Other | Admitting: Cardiovascular Disease

## 2011-03-13 VITALS — BP 122/68 | HR 62 | Ht 71.0 in | Wt 189.8 lb

## 2011-03-13 DIAGNOSIS — I255 Ischemic cardiomyopathy: Secondary | ICD-10-CM

## 2011-03-13 DIAGNOSIS — I2589 Other forms of chronic ischemic heart disease: Secondary | ICD-10-CM

## 2011-03-13 DIAGNOSIS — I2510 Atherosclerotic heart disease of native coronary artery without angina pectoris: Secondary | ICD-10-CM

## 2011-03-13 MED ORDER — ASPIRIN 81 MG PO TBEC: 81.0000 mg | DELAYED_RELEASE_TABLET | Freq: Every day | ORAL | Status: AC

## 2011-03-13 NOTE — Assessment & Plan Note (Signed)
Continue statin and recheck lipids next week.

## 2011-03-13 NOTE — Progress Notes (Signed)
History of Present Illness: 66 yo WM with history of CAD, NHL, HLD here today for cardiac follow up. He has been followed in the past by Dr. Deborah Chalk. Last cath February 2010 with occluded SVG to Diagonal and SVG to PDA were occluded. LIMA to LAD patent. There was diffuse disease in distal vessels felt best to be managed medically. HIs CABG was in May 2007 by Dr. Dorris Fetch. Last stress test was years ago.   He tells me that he is doing well. He has had no chest pain or SOB. Denies any palpitations, near syncope or syncope.  Plays golf 3-4 times per week.  Last lipid profile July 2012: Total chol: 251    HDL: 52   LDL 198 (this was on Zocor. He has since been switched to Crestor).  Primary care is Jarome Matin.   Past Medical History  Diagnosis Date  . Ischemic heart disease   . HLD (hyperlipidemia)   . Non Hodgkin's lymphoma   . Dizziness     1 episode     Past Surgical History  Procedure Date  . Coronary artery bypass graft 5/07    x5. LV dysfunction.   . Cholecystectomy   . Tonsillectomy   . Baker cyst removal     Current Outpatient Prescriptions  Medication Sig Dispense Refill  . aspirin 325 MG EC tablet Take 1 tablet (325 mg total) by mouth daily.  30 tablet  0  . co-enzyme Q-10 30 MG capsule Take 30 mg by mouth 3 (three) times daily.        . fish oil-omega-3 fatty acids 1000 MG capsule Take 2 g by mouth daily.        . L-ARGININE PO Take by mouth daily.        . metoprolol tartrate (LOPRESSOR) 25 MG tablet Take 25 mg by mouth 2 (two) times daily.       . Multiple Vitamin (MULTIVITAMIN) capsule Take 1 capsule by mouth daily.        Marland Kitchen RANEXA 500 MG 12 hr tablet TAKE 1 TABLET BY MOUTH EVERY DAY  90 tablet  3  . rosuvastatin (CRESTOR) 40 MG tablet Take 1/2 tablet by mouth daily at bedtime.  30 tablet  12  . Triprolidine-Pseudoephedrine (ANTIHISTAMINE PO) Take by mouth daily.          Allergies  Allergen Reactions  . Niaspan (Niacin (Antihyperlipidemic))      History   Social History  . Marital Status: Married    Spouse Name: N/A    Number of Children: N/A  . Years of Education: N/A   Occupational History  . Conservation officer, nature    Social History Main Topics  . Smoking status: Former Games developer  . Smokeless tobacco: Not on file   Comment: quit in 2007   . Alcohol Use: Not on file  . Drug Use: Not on file  . Sexually Active: Not on file   Other Topics Concern  . Not on file   Social History Narrative   Married, 2 children.Runs an executive recruiting company.     Family History  Problem Relation Age of Onset  . Heart disease Father   . Heart attack Father     Review of Systems:  As stated in the HPI and otherwise negative.   BP 122/68  Pulse 62  Ht 5\' 11"  (1.803 m)  Wt 189 lb 12.8 oz (86.093 kg)  BMI 26.47 kg/m2  Physical Examination: General: Well developed, well nourished, NAD HEENT:  OP clear, mucus membranes moist SKIN: warm, dry. No rashes. Neuro: No focal deficits Musculoskeletal: Muscle strength 5/5 all ext Psychiatric: Mood and affect normal Neck: No JVD, no carotid bruits, no thyromegaly, no lymphadenopathy. Lungs:Clear bilaterally, no wheezes, rhonci, crackles Cardiovascular: Regular rate and rhythm. No murmurs, gallops or rubs. Abdomen:Soft. Bowel sounds present. Non-tender.  Extremities: No lower extremity edema. Pulses are 2 + in the bilateral DP/PT.  Echo 09/01/10:  Left ventricle: The cavity size was normal. Wall thickness was normal. The estimated ejection fraction was 50%. Wall motion was normal; there were no regional wall motion abnormalities. Doppler parameters are consistent with abnormal left ventricular relaxation (grade 1 diastolic dysfunction).

## 2011-03-13 NOTE — Assessment & Plan Note (Signed)
Most recent LVEF 50% by echo July 2012.

## 2011-03-13 NOTE — Assessment & Plan Note (Signed)
Stable. Continue ASA but will decrease to 81 mg po Qdaily. Will continue metoprolol at current dose. Continue statin. Check fasting lipids and LFTs next week.

## 2011-03-13 NOTE — Patient Instructions (Addendum)
Your physician wants you to follow-up in: 6 months. You will receive a reminder letter in the mail two months in advance. If you don't receive a letter, please call our office to schedule the follow-up appointment.    Your physician recommends that you return for fasting lab work next week--Lipid and Liver profile.   Your physician has recommended you make the following change in your medication: Decrease aspirin to 81 mg by mouth daily   

## 2011-03-16 ENCOUNTER — Other Ambulatory Visit: Payer: Medicare Other | Admitting: *Deleted

## 2011-03-17 ENCOUNTER — Other Ambulatory Visit: Payer: Medicare Other | Admitting: *Deleted

## 2011-03-20 ENCOUNTER — Other Ambulatory Visit (INDEPENDENT_AMBULATORY_CARE_PROVIDER_SITE_OTHER): Payer: Medicare Other | Admitting: *Deleted

## 2011-03-20 DIAGNOSIS — I2510 Atherosclerotic heart disease of native coronary artery without angina pectoris: Secondary | ICD-10-CM

## 2011-03-20 LAB — HEPATIC FUNCTION PANEL
ALT: 24 U/L (ref 0–53)
AST: 27 U/L (ref 0–37)
Albumin: 3.7 g/dL (ref 3.5–5.2)
Alkaline Phosphatase: 44 U/L (ref 39–117)
Bilirubin, Direct: 0 mg/dL (ref 0.0–0.3)
Total Bilirubin: 0.8 mg/dL (ref 0.3–1.2)
Total Protein: 6.2 g/dL (ref 6.0–8.3)

## 2011-03-20 LAB — LIPID PANEL
Cholesterol: 193 mg/dL (ref 0–200)
HDL: 55.4 mg/dL (ref >39.00)
LDL Cholesterol: 129 mg/dL — ABNORMAL HIGH (ref 0–99)
Total CHOL/HDL Ratio: 3
Triglycerides: 42 mg/dL (ref 0.0–149.0)
VLDL: 8.4 mg/dL (ref 0.0–40.0)

## 2011-03-25 ENCOUNTER — Telehealth: Payer: Self-pay | Admitting: Cardiovascular Disease

## 2011-03-25 DIAGNOSIS — E785 Hyperlipidemia, unspecified: Secondary | ICD-10-CM

## 2011-03-25 MED ORDER — ROSUVASTATIN CALCIUM 40 MG PO TABS: 40.0000 mg | ORAL_TABLET | Freq: Every day | ORAL | Status: DC

## 2011-03-25 NOTE — Telephone Encounter (Signed)
Spoke with pt and reviewed lipid and liver results and recommendation from Dr. Clifton James to increase Crestor to 40 mg by mouth daily.  Pt will make change and come in for fasting lab work the week of June 01, 2011.

## 2011-03-25 NOTE — Telephone Encounter (Signed)
FU Call: Pt returning call from our office regarding pt lab results. Please return pt call to discuss further.  

## 2011-04-27 ENCOUNTER — Telehealth: Payer: Self-pay | Admitting: Cardiovascular Disease

## 2011-04-27 MED ORDER — METOPROLOL TARTRATE 25 MG PO TABS: 25.0000 mg | ORAL_TABLET | Freq: Two times a day (BID) | ORAL | Status: DC

## 2011-04-27 NOTE — Telephone Encounter (Signed)
Metoprolol tartrate refill to fax [facility] [phone] ID #[id number], pt out needs faxed asap

## 2011-04-27 NOTE — Telephone Encounter (Signed)
Refilled medication

## 2011-04-30 ENCOUNTER — Other Ambulatory Visit: Payer: Self-pay | Admitting: *Deleted

## 2011-04-30 DIAGNOSIS — E785 Hyperlipidemia, unspecified: Secondary | ICD-10-CM

## 2011-04-30 MED ORDER — ROSUVASTATIN CALCIUM 40 MG PO TABS: 40.0000 mg | ORAL_TABLET | Freq: Every day | ORAL | Status: DC

## 2011-04-30 NOTE — Telephone Encounter (Signed)
Needs refill of Crestor 40 sent to [facility] on [address].

## 2011-05-12 ENCOUNTER — Telehealth: Payer: Self-pay | Admitting: Oncology

## 2011-05-12 NOTE — Telephone Encounter (Signed)
lm with 4/26 appt info    aom

## 2011-06-02 ENCOUNTER — Other Ambulatory Visit (INDEPENDENT_AMBULATORY_CARE_PROVIDER_SITE_OTHER): Payer: Medicare Other

## 2011-06-02 DIAGNOSIS — E785 Hyperlipidemia, unspecified: Secondary | ICD-10-CM

## 2011-06-02 LAB — HEPATIC FUNCTION PANEL
ALT: 22 U/L (ref 0–53)
AST: 28 U/L (ref 0–37)
Albumin: 4.1 g/dL (ref 3.5–5.2)
Alkaline Phosphatase: 44 U/L (ref 39–117)
Bilirubin, Direct: 0.1 mg/dL (ref 0.0–0.3)
Total Bilirubin: 0.6 mg/dL (ref 0.3–1.2)
Total Protein: 6.8 g/dL (ref 6.0–8.3)

## 2011-06-02 LAB — LIPID PANEL
Cholesterol: 189 mg/dL (ref 0–200)
HDL: 56.6 mg/dL (ref >39.00)
LDL Cholesterol: 124 mg/dL — ABNORMAL HIGH (ref 0–99)
Total CHOL/HDL Ratio: 3
Triglycerides: 42 mg/dL (ref 0.0–149.0)
VLDL: 8.4 mg/dL (ref 0.0–40.0)

## 2011-06-05 ENCOUNTER — Ambulatory Visit: Payer: Medicare Other | Admitting: Oncology

## 2011-06-05 ENCOUNTER — Telehealth: Payer: Self-pay | Admitting: Oncology

## 2011-06-05 ENCOUNTER — Other Ambulatory Visit: Payer: Medicare Other

## 2011-06-05 NOTE — Telephone Encounter (Signed)
pt called lmovm that he will not be able to make appt for 4-26 and to rtn call to r/s . return call lmovm to rtn call to r/s

## 2011-06-05 NOTE — Telephone Encounter (Signed)
pt called and r/s missed appt for 04/26 to 05/17

## 2011-06-17 ENCOUNTER — Telehealth: Payer: Self-pay | Admitting: Cardiovascular Disease

## 2011-06-17 NOTE — Telephone Encounter (Signed)
error 

## 2011-06-19 ENCOUNTER — Other Ambulatory Visit: Payer: Self-pay | Admitting: *Deleted

## 2011-06-19 DIAGNOSIS — C8299 Follicular lymphoma, unspecified, extranodal and solid organ sites: Secondary | ICD-10-CM | POA: Insufficient documentation

## 2011-06-22 ENCOUNTER — Ambulatory Visit: Payer: Medicare Other | Admitting: Pharmacist

## 2011-06-24 ENCOUNTER — Ambulatory Visit (INDEPENDENT_AMBULATORY_CARE_PROVIDER_SITE_OTHER): Payer: Medicare Other | Admitting: Pharmacist

## 2011-06-24 VITALS — Wt 191.0 lb

## 2011-06-24 DIAGNOSIS — E785 Hyperlipidemia, unspecified: Secondary | ICD-10-CM

## 2011-06-24 MED ORDER — EZETIMIBE 10 MG PO TABS: 10.0000 mg | ORAL_TABLET | Freq: Every day | ORAL | Status: DC

## 2011-06-24 NOTE — Assessment & Plan Note (Addendum)
TC 189 (goal<200), TG 42 (goal<150), HDL 56.6 (goal>40) and LDL 124 (goal<70). LFTs are WNL.  Patient appears to be tolerating medication well.  All labs are at goal with the exception of LDL which has increased since starting new diet.  Current labs reflect 40mg  dose of Crestor.  There was very little change in LDL when pt increased from 20mg  to 40mg .  Given this and his intolerance, will try to augment therapy rather than titrate Crestor.  Instructed patient to try taking Zetia 10mg  every day with Crestor 20mg  to reduce LDL.  Encouraged to maintain his diet and the same level of exercise.  Will follow-up in 1 month.

## 2011-06-24 NOTE — Patient Instructions (Signed)
Start Zetia 10mg  daily  Continue Crestor 20mg  once daily  Continue your diet and exercise routine  We will recheck your labs in 1 month

## 2011-06-24 NOTE — Progress Notes (Signed)
HPI: Patient presents for initial appointment with the lipid clinic.  Currently taking Crestor 20mg  daily, fish oil 2g daily and coenzyme Q10.  Patient was on Crestor 40mg , but decreased to 20mg  at the end of April because of muscle pains and tiredness. Current labs reflect Crestor 40mg . Has no complaints with medications at this time.  Patient has a history of smoking but denies smoking and alcohol at this time.    Diet: Patient sees a doctor in [place] that is following him on a high fat and low carbohydrate diet (Dr. Shanda Howells) Patient reports that he has been very hungry lately and eating a lot more.  Reports feeling well on diet until Crestor was increased to 40mg .   For desert he will eat a no-bake cheesecake that is approved for the diet he is on.  Patient tries to avoid fruits and carrots due to diet.  Breakfast: Has bran with cheese and eggs.  Has previously been eating a lot of bacon, but has backed down on this.  Lunch: Microwaves cheese to make crisps and will use this as a bread substitute with tuna or salami.  Dinner: Has started eating a lot more beef, but still eating things like chicken and fish.  Eats a lot of the green, leafy vegetables and broccoli and asparagus.    Exercise: Reports exercising for 1hr/day for 5 days during the week.  He will power walk for about 40-45 minutes and then do resistance training for 12-18min (push-ups, sit-ups etc.).  On days when he is not exercising, he will mow the grass and golf.    Current Outpatient Prescriptions  Medication Sig Dispense Refill  . aspirin 81 MG EC tablet Take 1 tablet (81 mg total) by mouth daily.  30 tablet  0  . cetirizine (ZYRTEC) 10 MG tablet Take 10 mg by mouth daily. Patient alternates Zyrtec and Claritin every 90 days.      . cholecalciferol (VITAMIN D) 400 UNITS TABS Take 400 Units by mouth daily.      Marland Kitchen co-enzyme Q-10 30 MG capsule Take 100 mg by mouth daily.       . fish oil-omega-3 fatty acids 1000 MG capsule Take 2 g  by mouth daily.        . L-ARGININE PO Take 4,000 mg by mouth daily.       . metoprolol tartrate (LOPRESSOR) 25 MG tablet Take 1 tablet (25 mg total) by mouth 2 (two) times daily.  180 tablet  3  . Multiple Vitamin (MULTIVITAMIN) capsule Take 1 capsule by mouth daily.        Marland Kitchen RANEXA 500 MG 12 hr tablet TAKE 1 TABLET BY MOUTH EVERY DAY  90 tablet  3  . rosuvastatin (CRESTOR) 40 MG tablet Take 20 mg by mouth daily.      Marland Kitchen DISCONTD: rosuvastatin (CRESTOR) 40 MG tablet Take 1 tablet (40 mg total) by mouth daily.  30 tablet  11  . ezetimibe (ZETIA) 10 MG tablet Take 1 tablet (10 mg total) by mouth daily.  28 tablet  0    Allergies  Allergen Reactions  . Niaspan (Niacin Er (Antihyperlipidemic))     Stomach intolerance

## 2011-06-25 ENCOUNTER — Telehealth: Payer: Self-pay | Admitting: Oncology

## 2011-06-25 NOTE — Telephone Encounter (Signed)
pt called and lm to r/s 5/17,called back and r/s to 5/24  aom

## 2011-06-26 ENCOUNTER — Other Ambulatory Visit: Payer: Medicare Other | Admitting: Lab

## 2011-06-26 ENCOUNTER — Ambulatory Visit: Payer: Medicare Other | Admitting: Oncology

## 2011-07-03 ENCOUNTER — Telehealth: Payer: Self-pay | Admitting: Oncology

## 2011-07-03 ENCOUNTER — Other Ambulatory Visit (HOSPITAL_BASED_OUTPATIENT_CLINIC_OR_DEPARTMENT_OTHER): Payer: Medicare Other | Admitting: Lab

## 2011-07-03 ENCOUNTER — Ambulatory Visit (HOSPITAL_BASED_OUTPATIENT_CLINIC_OR_DEPARTMENT_OTHER): Payer: Medicare Other | Admitting: Oncology

## 2011-07-03 VITALS — BP 99/64 | HR 66 | Temp 96.9°F | Ht 71.0 in | Wt 188.6 lb

## 2011-07-03 DIAGNOSIS — C8299 Follicular lymphoma, unspecified, extranodal and solid organ sites: Secondary | ICD-10-CM

## 2011-07-03 LAB — CBC WITH DIFFERENTIAL/PLATELET
BASO%: 1.2 % (ref 0.0–2.0)
Basophils Absolute: 0.1 10*3/uL (ref 0.0–0.1)
EOS%: 2.5 % (ref 0.0–7.0)
Eosinophils Absolute: 0.3 10*3/uL (ref 0.0–0.5)
HCT: 41.2 % (ref 38.4–49.9)
HGB: 13.6 g/dL (ref 13.0–17.1)
LYMPH%: 21.8 % (ref 14.0–49.0)
MCH: 29.9 pg (ref 27.2–33.4)
MCHC: 32.9 g/dL (ref 32.0–36.0)
MCV: 90.8 fL (ref 79.3–98.0)
MONO#: 1.1 10*3/uL — ABNORMAL HIGH (ref 0.1–0.9)
MONO%: 11.2 % (ref 0.0–14.0)
NEUT#: 6.4 10*3/uL (ref 1.5–6.5)
NEUT%: 63.3 % (ref 39.0–75.0)
Platelets: 247 10*3/uL (ref 140–400)
RBC: 4.54 10*6/uL (ref 4.20–5.82)
RDW: 13.6 % (ref 11.0–14.6)
WBC: 10.1 10*3/uL (ref 4.0–10.3)
lymph#: 2.2 10*3/uL (ref 0.9–3.3)
nRBC: 0 % (ref 0–0)

## 2011-07-03 NOTE — Telephone Encounter (Signed)
appts made and printed for pt aom °

## 2011-07-03 NOTE — Progress Notes (Signed)
   [facility]    OFFICE PROGRESS NOTE   INTERVAL HISTORY:   He returns as scheduled. He reports a "sinus "infection for the past several weeks. He has been treated with antibiotics by his primary physician and a "allergies ". The symptoms have improved. No fever, night sweats, or anorexia. No palpable lymph nodes.  Objective:  Vital signs in last 24 hours:  Blood pressure 99/64, pulse 66, temperature 96.9 F (36.1 C), temperature source Oral, height 5\' 11"  (1.803 m), weight 188 lb 9.6 oz (85.548 kg).    HEENT: Oropharynx without mass. Lymphatics: 1.5 cm mobile left high anterior cervical/jugular node. No other palpable neck nodes. Prominent bilateral axillary fat pads,? 1/2 cm left axillary node and less than 1 cm left inguinal nodes. No femoral nodes. Resp: Lungs clear bilaterally Cardio: Regular rate and rhythm GI: No hepatosplenomegaly Vascular: No leg edema   Lab Results:  Lab Results  Component Value Date   WBC 10.1 07/03/2011   HGB 13.6 07/03/2011   HCT 41.2 07/03/2011   MCV 90.8 07/03/2011   PLT 247 07/03/2011   ANC 6.4    Medications: I have reviewed the patient's current medications.  Assessment/Plan: 1. Non-Hodgkin lymphoma - follicular B-cell lymphoma.  He remains asymptomatic.  He was diagnosed with non-Hodgkin lymphoma in May 2007. 2. Coronary artery disease. 3. Mild anemia in July 2007 - resolved. 4. Recent "sinus "infection 5. Left anterior cervical/jugular node-? Related to the recent upper respiratory infection or non-Hodgkin,.   Disposition:  The left neck node may be related to a recent infection versus lymphoma. There is otherwise no evidence for progression of the lymphoma. He will return for an office visit and repeat examination of the left neck in 4 months. He will contact us in the interim if this lymph node enlarges.   Thornton Papas, MD  07/03/2011  10:21 AM

## 2011-07-16 ENCOUNTER — Telehealth: Payer: Self-pay | Admitting: Pharmacist

## 2011-07-16 NOTE — Telephone Encounter (Signed)
Pt called.  Reports that he has multiple sinus infections since starting Zetia.  There is a small risk of this occuring (~2%).  Pt is going to stop Zetia for a few days and see if his symptoms resolve.  He does have some allergies but states these have gotten better lately.  Will call to let us know if he is better in a few days.

## 2011-07-23 ENCOUNTER — Other Ambulatory Visit: Payer: Medicare Other

## 2011-07-27 ENCOUNTER — Ambulatory Visit: Payer: Medicare Other | Admitting: Pharmacist

## 2011-08-04 ENCOUNTER — Encounter: Payer: Self-pay | Admitting: Cardiovascular Disease

## 2011-08-04 NOTE — Telephone Encounter (Signed)
New Problem:    Patient called in because he came down with a bad sinus infection that he believed was due to his ezetimibe (ZETIA) 10 MG tablet.  Has since found out that it wasn't the cause and would like new samples to go back on the ezetimibe (ZETIA) 10 MG tablet.  Please call back.

## 2011-08-04 NOTE — Telephone Encounter (Signed)
Pt left message today stating he thinks his sinus infections were due to another reason and would like to restart Zetia.  Called pt's home number and spoke with wife.  Pt is out of town for the rest of the week.  LM on his cell phone.

## 2011-08-04 NOTE — Telephone Encounter (Signed)
This encounter was created in error - please disregard.

## 2011-08-14 NOTE — Telephone Encounter (Signed)
Spoke with pt. They now think his infection is related to a fungus.  He wants to start back on the Zetia.  Will recheck labs in 4 weeks.

## 2011-09-14 ENCOUNTER — Ambulatory Visit (INDEPENDENT_AMBULATORY_CARE_PROVIDER_SITE_OTHER): Payer: Medicare Other | Admitting: *Deleted

## 2011-09-14 DIAGNOSIS — I2589 Other forms of chronic ischemic heart disease: Secondary | ICD-10-CM

## 2011-09-14 DIAGNOSIS — E785 Hyperlipidemia, unspecified: Secondary | ICD-10-CM

## 2011-09-14 DIAGNOSIS — I2510 Atherosclerotic heart disease of native coronary artery without angina pectoris: Secondary | ICD-10-CM

## 2011-09-14 DIAGNOSIS — I255 Ischemic cardiomyopathy: Secondary | ICD-10-CM

## 2011-09-14 LAB — LIPID PANEL
Cholesterol: 117 mg/dL (ref 0–200)
HDL: 52.7 mg/dL (ref >39.00)
LDL Cholesterol: 53 mg/dL (ref 0–99)
Total CHOL/HDL Ratio: 2
Triglycerides: 58 mg/dL (ref 0.0–149.0)
VLDL: 11.6 mg/dL (ref 0.0–40.0)

## 2011-09-14 LAB — HEPATIC FUNCTION PANEL
ALT: 25 U/L (ref 0–53)
AST: 27 U/L (ref 0–37)
Albumin: 3.6 g/dL (ref 3.5–5.2)
Alkaline Phosphatase: 49 U/L (ref 39–117)
Bilirubin, Direct: 0.1 mg/dL (ref 0.0–0.3)
Total Bilirubin: 0.7 mg/dL (ref 0.3–1.2)
Total Protein: 7.3 g/dL (ref 6.0–8.3)

## 2011-09-15 ENCOUNTER — Ambulatory Visit (INDEPENDENT_AMBULATORY_CARE_PROVIDER_SITE_OTHER): Payer: Medicare Other | Admitting: Pharmacist

## 2011-09-15 VITALS — Wt 187.0 lb

## 2011-09-15 DIAGNOSIS — E785 Hyperlipidemia, unspecified: Secondary | ICD-10-CM

## 2011-09-15 NOTE — Patient Instructions (Addendum)
When you pain resolves, try to increase Crestor to 20mg  every day except 40mg  on Monday, Wednesday and Friday.  If you have increased muscle pains, just decrease Crestor dose.   Continue your diet and exercise routine.   Follow up in October.

## 2011-09-29 NOTE — Assessment & Plan Note (Signed)
Pt's labs greatly improved with Zetia but unfortunately pt unable to tolerate.  TC- 117 (goal<200), TG- 58 (goal<150), HDL- 52 (goal>40), LDL- 53 (goal<70).  LFTs are WNL.  Discussed other options with pt.  He was able to tolerate Crestor 20 but not Crestor 40mg  daily.  Will try to titrate Crestor 20mg  to 20mg  daily except 40mg  3 days of the week.  If pt has problems with muscle aches, will have him titrate back down to 20mg  daily.  Will follow up in October.

## 2011-09-29 NOTE — Progress Notes (Signed)
HPI: Patient presents for follow up appointment with the lipid clinic.  We started Zetia at last visit.   A few weeks after starting it, pt called in reporting issues with sinusitis he thought may be related to Zetia.  There is a small chance Zetia can cause sinusitis so we stopped it at that time.  Pt called back and stated they thought his infection was related to a fungus so he restarted the Zetia.  About a week before this visit, he started having similar symptoms- pain all over and SOB so he stopped the Zetia again on Saturday.  Note- labs for this visit are a result of pt taking Zetia.   Diet: Pt continues to follow his high fat diet as prescribed by Dr. Danny Lawless.  He is considering decreasing his polyunsaturated fats more.   Exercise: Reports exercising for 1hr/day for 5 days during the week.  He will power walk for about 40-45 minutes and then do resistance training for 12-11min (push-ups, sit-ups etc.).  On days when he is not exercising, he will mow the grass and golf.    Current Outpatient Prescriptions  Medication Sig Dispense Refill  . aspirin 81 MG EC tablet Take 1 tablet (81 mg total) by mouth daily.  30 tablet  0  . cetirizine (ZYRTEC) 10 MG tablet Take 10 mg by mouth daily. Patient alternates Zyrtec and Claritin every 90 days.      . cholecalciferol (VITAMIN D) 400 UNITS TABS Take 400 Units by mouth daily.      Marland Kitchen co-enzyme Q-10 30 MG capsule Take 100 mg by mouth daily.       . fish oil-omega-3 fatty acids 1000 MG capsule Take 2 g by mouth daily.        . L-ARGININE PO Take 4,000 mg by mouth daily.       . metoprolol tartrate (LOPRESSOR) 25 MG tablet Take 1 tablet (25 mg total) by mouth 2 (two) times daily.  180 tablet  3  . Multiple Vitamin (MULTIVITAMIN) capsule Take 1 capsule by mouth daily.        Marland Kitchen RANEXA 500 MG 12 hr tablet TAKE 1 TABLET BY MOUTH EVERY DAY  90 tablet  3  . rosuvastatin (CRESTOR) 40 MG tablet Take 20 mg by mouth daily.        Allergies  Allergen Reactions  .  Niaspan (Niacin Er)     Stomach intolerance

## 2011-11-02 ENCOUNTER — Telehealth: Payer: Self-pay | Admitting: Oncology

## 2011-11-02 ENCOUNTER — Ambulatory Visit (HOSPITAL_BASED_OUTPATIENT_CLINIC_OR_DEPARTMENT_OTHER): Payer: Medicare Other | Admitting: Oncology

## 2011-11-02 ENCOUNTER — Ambulatory Visit (HOSPITAL_BASED_OUTPATIENT_CLINIC_OR_DEPARTMENT_OTHER): Payer: Medicare Other | Admitting: Lab

## 2011-11-02 VITALS — BP 117/73 | HR 73 | Temp 97.0°F | Resp 18 | Ht 71.0 in | Wt 190.2 lb

## 2011-11-02 DIAGNOSIS — C8299 Follicular lymphoma, unspecified, extranodal and solid organ sites: Secondary | ICD-10-CM

## 2011-11-02 DIAGNOSIS — R599 Enlarged lymph nodes, unspecified: Secondary | ICD-10-CM

## 2011-11-02 DIAGNOSIS — R0609 Other forms of dyspnea: Secondary | ICD-10-CM

## 2011-11-02 DIAGNOSIS — R0989 Other specified symptoms and signs involving the circulatory and respiratory systems: Secondary | ICD-10-CM

## 2011-11-02 LAB — CBC WITH DIFFERENTIAL/PLATELET
BASO%: 1.5 % (ref 0.0–2.0)
Basophils Absolute: 0.1 10*3/uL (ref 0.0–0.1)
EOS%: 2.8 % (ref 0.0–7.0)
Eosinophils Absolute: 0.2 10*3/uL (ref 0.0–0.5)
HCT: 40 % (ref 38.4–49.9)
HGB: 13.4 g/dL (ref 13.0–17.1)
LYMPH%: 24.3 % (ref 14.0–49.0)
MCH: 30.6 pg (ref 27.2–33.4)
MCHC: 33.5 g/dL (ref 32.0–36.0)
MCV: 91.2 fL (ref 79.3–98.0)
MONO#: 0.9 10*3/uL (ref 0.1–0.9)
MONO%: 12.3 % (ref 0.0–14.0)
NEUT#: 4.1 10*3/uL (ref 1.5–6.5)
NEUT%: 59.1 % (ref 39.0–75.0)
Platelets: 271 10*3/uL (ref 140–400)
RBC: 4.39 10*6/uL (ref 4.20–5.82)
RDW: 13.6 % (ref 11.0–14.6)
WBC: 7 10*3/uL (ref 4.0–10.3)
lymph#: 1.7 10*3/uL (ref 0.9–3.3)

## 2011-11-02 LAB — BASIC METABOLIC PANEL (CC13)
BUN: 25 mg/dL (ref 7.0–26.0)
CO2: 23 mEq/L (ref 22–29)
Calcium: 9.6 mg/dL (ref 8.4–10.4)
Chloride: 106 mEq/L (ref 98–107)
Creatinine: 1.1 mg/dL (ref 0.7–1.3)
Glucose: 98 mg/dl (ref 70–99)
Potassium: 4.6 mEq/L (ref 3.5–5.1)
Sodium: 139 mEq/L (ref 136–145)

## 2011-11-02 LAB — LACTATE DEHYDROGENASE (CC13): LDH: 170 U/L (ref 125–220)

## 2011-11-02 NOTE — Progress Notes (Signed)
   [facility]    OFFICE PROGRESS NOTE   INTERVAL HISTORY:   He returns as scheduled. No fever, night sweats, or anorexia. He has not noted any change in the left neck lymph node. No other palpable lymph nodes. He reports dyspnea when climbing stairs recently. No dyspnea when exercising in other ways. He complains of a "raspy "voice for the past several days. Objective:  Vital signs in last 24 hours:  Blood pressure 117/73, pulse 73, temperature 97 F (36.1 C), temperature source Oral, resp. rate 18, height 5\' 11"  (1.803 m), weight 190 lb 3.2 oz (86.274 kg).    HEENT: Oropharynx without visible mass, neck without mass Lymphatics: 2-3 cm mobile lymph node at the high left anterior cervical region. No other palpable cervical, supraclavicular, axillary, or inguinal nodes Resp: Lungs clear bilaterally Cardio: Regular rate and rhythm GI: No hepatosplenomegaly Vascular: No leg edema   Medications: I have reviewed the patient's current medications.  Assessment/Plan: 1. Non-Hodgkin lymphoma - follicular B-cell lymphoma. He remains asymptomatic. He was diagnosed with non-Hodgkin lymphoma in May 2007. 2. Coronary artery disease. 3. Mild anemia in July 2007 - resolved 4. Left anterior cervical/jugular node-enlarged compared to when he was here on may 24th 2013 5. Exertional dyspnea-? Etiology. He is scheduled to see cardiology later this week.  Disposition:  The left neck lymph node is larger. It is possible the lymphoma is responsible for the dyspnea and voice change. We decided to proceed with staging CT scans of the neck, chest, abdomen, and pelvis within the next few weeks. He is scheduled to take a trip out of [place] on a later this week. We also obtained an LDH, chemistry panel, and CBC today.  Mr. Villamar will return for an office visit on 11/18/2011.   Thornton Papas, MD  11/02/2011  9:24 AM

## 2011-11-02 NOTE — Telephone Encounter (Signed)
gv pt appt schedule for October including ct.

## 2011-11-13 ENCOUNTER — Ambulatory Visit (HOSPITAL_COMMUNITY)
Admission: RE | Admit: 2011-11-13 | Discharge: 2011-11-13 | Disposition: A | Discharge status: Home / Self Care | Payer: Medicare Other | Source: Ambulatory Visit | Attending: Oncology | Admitting: Oncology

## 2011-11-13 DIAGNOSIS — K5730 Diverticulosis of large intestine without perforation or abscess without bleeding: Secondary | ICD-10-CM | POA: Insufficient documentation

## 2011-11-13 DIAGNOSIS — J984 Other disorders of lung: Secondary | ICD-10-CM | POA: Insufficient documentation

## 2011-11-13 DIAGNOSIS — I2510 Atherosclerotic heart disease of native coronary artery without angina pectoris: Secondary | ICD-10-CM | POA: Insufficient documentation

## 2011-11-13 DIAGNOSIS — R911 Solitary pulmonary nodule: Secondary | ICD-10-CM | POA: Insufficient documentation

## 2011-11-13 DIAGNOSIS — C8299 Follicular lymphoma, unspecified, extranodal and solid organ sites: Secondary | ICD-10-CM

## 2011-11-13 MED ORDER — IOHEXOL 300 MG/ML  SOLN
125.0000 mL | Freq: Once | INTRAMUSCULAR | Status: AC | PRN
  Administered 2011-11-13: 125 mL via INTRAVENOUS

## 2011-11-18 ENCOUNTER — Ambulatory Visit (HOSPITAL_BASED_OUTPATIENT_CLINIC_OR_DEPARTMENT_OTHER): Payer: Medicare Other | Admitting: Oncology

## 2011-11-18 ENCOUNTER — Telehealth: Payer: Self-pay | Admitting: Oncology

## 2011-11-18 VITALS — BP 103/72 | HR 72 | Temp 97.3°F | Resp 20 | Ht 71.0 in | Wt 190.3 lb

## 2011-11-18 DIAGNOSIS — R490 Dysphonia: Secondary | ICD-10-CM

## 2011-11-18 DIAGNOSIS — R599 Enlarged lymph nodes, unspecified: Secondary | ICD-10-CM

## 2011-11-18 DIAGNOSIS — C8299 Follicular lymphoma, unspecified, extranodal and solid organ sites: Secondary | ICD-10-CM

## 2011-11-18 DIAGNOSIS — R911 Solitary pulmonary nodule: Secondary | ICD-10-CM

## 2011-11-18 NOTE — Telephone Encounter (Signed)
lvm for pt regarding April 2014 ....mailed april 2014 schedule to pt.

## 2011-11-18 NOTE — Progress Notes (Signed)
   [facility]    OFFICE PROGRESS NOTE   INTERVAL HISTORY:   He returns as scheduled. He continues to have hoarseness. No other complaint. No fever, night sweats, or anorexia. He is not bothered by the left neck fullness.  Objective:  Vital signs in last 24 hours:  Blood pressure 103/72, pulse 72, temperature 97.3 F (36.3 C), temperature source Oral, resp. rate 20, height 5\' 11"  (1.803 m), weight 190 lb 4.8 oz (86.32 kg).    HEENT: Oropharynx without visible mass, approximate 2-3 cm mobile mass inferior to the left parotid and overlying the carotid Resp: Clear bilaterally, distant breath sounds Cardio: Regular rate and rhythm   Lab Results:  Lab Results  Component Value Date   WBC 7.0 11/02/2011   HGB 13.4 11/02/2011   HCT 40.0 11/02/2011   MCV 91.2 11/02/2011   PLT 271 11/02/2011   LDH 170  X-rays: CT scans of the neck, chest, abdomen, and pelvis on 11/13/2011-no lymphadenopathy in the neck, emphysema, 0.8 x 0.5 cm nodule in the right lobe, mediastinal and right hilar nonpathologic lymph nodes, no adenopathy in the abdomen or pelvis, the liver and spleen appear unremarkable  Medications: I have reviewed the patient's current medications.  Assessment/Plan: 1. Non-Hodgkin lymphoma - follicular B-cell lymphoma. He remains asymptomatic. He was diagnosed with non-Hodgkin lymphoma in May 2007. 2. Coronary artery disease. 3. Mild anemia in July 2007 - resolved 4. Left anterior cervical/jugular node-enlarged compared to when he was here on may 24th 2013-I reviewed the 11/13/2011 CT with a radiologist. There is asymmetric fullness at the inferior tip of the left parotid,? Parotid gland enlargement versus a lymph node 5. Hoarseness-he will contact us if this does not resolve over the next few weeks and we will make an ENT referral 6. Right lung nodule on the CT 11/13/2011-he will be scheduled for a followup CT at a six-month interval   Disposition:  The staging CT  of valuation does not reveal evidence for progressive lymphoma. The left neck mass may represent an enlarged parotid gland versus a lymph node. He appears asymptomatic at present. He will contact us if this lesion and large is or he develops new symptoms.  Mr.Mausolf will contact us if the hoarseness does not resolve over the next few weeks and we will make an ENT referral. He will return for an office visit in 6 months.   Thornton Papas, MD  11/18/2011  9:45 AM

## 2011-11-24 ENCOUNTER — Telehealth: Payer: Self-pay | Admitting: Pharmacist

## 2011-11-24 NOTE — Telephone Encounter (Signed)
Pt called to report issues with taking metamucil.  He states he is getting more SOB on exertion- especially when he walks up the stairs- and has correlated this with taking the metamucil.  He has been taking 2 capsules three times a day and has decreased this to 2 capsules twice daily and not seen any change.  He is also concerned it is causing some head congestion as well.  He has an appt to discuss lipid lowering therapy next week.  Pt is willing to continue metamucil until that time to see if we have seen any change in cholesterol.  Explained to pt that SOB and congestion likely not caused by metamucil.  Will f/u after labs available.

## 2011-11-30 ENCOUNTER — Other Ambulatory Visit (INDEPENDENT_AMBULATORY_CARE_PROVIDER_SITE_OTHER): Payer: Medicare Other

## 2011-11-30 DIAGNOSIS — E785 Hyperlipidemia, unspecified: Secondary | ICD-10-CM

## 2011-11-30 LAB — LIPID PANEL
Cholesterol: 171 mg/dL (ref 0–200)
HDL: 52.8 mg/dL (ref >39.00)
LDL Cholesterol: 109 mg/dL — ABNORMAL HIGH (ref 0–99)
Total CHOL/HDL Ratio: 3
Triglycerides: 46 mg/dL (ref 0.0–149.0)
VLDL: 9.2 mg/dL (ref 0.0–40.0)

## 2011-11-30 LAB — HEPATIC FUNCTION PANEL
ALT: 22 U/L (ref 0–53)
AST: 27 U/L (ref 0–37)
Albumin: 3.6 g/dL (ref 3.5–5.2)
Alkaline Phosphatase: 53 U/L (ref 39–117)
Bilirubin, Direct: 0.1 mg/dL (ref 0.0–0.3)
Total Bilirubin: 0.5 mg/dL (ref 0.3–1.2)
Total Protein: 6.5 g/dL (ref 6.0–8.3)

## 2011-12-01 ENCOUNTER — Encounter: Payer: Self-pay | Admitting: Pharmacist

## 2011-12-01 ENCOUNTER — Ambulatory Visit (INDEPENDENT_AMBULATORY_CARE_PROVIDER_SITE_OTHER): Payer: Medicare Other | Admitting: Pharmacist

## 2011-12-01 ENCOUNTER — Ambulatory Visit: Payer: Medicare Other | Admitting: Pharmacist

## 2011-12-01 VITALS — Wt 190.0 lb

## 2011-12-01 DIAGNOSIS — E785 Hyperlipidemia, unspecified: Secondary | ICD-10-CM

## 2011-12-01 NOTE — Patient Instructions (Addendum)
Continue Crestor 20 mg daily and dietary fiber daily as tolerated.   Continue exercise regimen.    Recheck in 3 months.  We will contact you to schedule this appointment.

## 2011-12-01 NOTE — Assessment & Plan Note (Addendum)
LDL has increased since last visit to 109 (goal <70).  Other values at goal: TC 171 (goal<200), HDL 52 (goal>40), TG 46 (<150).  Fiber is not likely to be causing SOB and blurred vision due to lack of absorption from GI tract.  Explained this to patient. Other options were discussed, including adding a bile acid sequestrant to get LDL to goal.  At this time, the patient prefers to continue on current regimen and try to restart dietary fiber, maybe a different formulation in case of intolerance to a filler in the Metamucil capsules.  Recheck in 3 months. If continue to be ok, patient can follow up with Dr. Clifton James.   Pt seen with Weston Brass, PharmD, CPP

## 2011-12-01 NOTE — Progress Notes (Signed)
Patient presents to clinic for follow up appointment with lipid clinic. He has been taking Crestor 20 mg daily, fish oil 2 g daily, and Metamucil capsules.  He complains of SOB and blurred vision while taking the Metamucil. He switched to his wife's dietary fiber called PGX and is complaining of the same symptoms.  He reports drinking lots of water.    Diet: He continues his high fat diet prescribed by Dr. Danny Lawless.   Exercise: He reports 1 hr of exercise 6 days/week consisting of 35-40 min of walking and 12-20 min of strength training.  On the day he is not exercising, he mows the grass and plays golf.  Current Outpatient Prescriptions on File Prior to Visit  Medication Sig Dispense Refill  . aspirin 81 MG EC tablet Take 1 tablet (81 mg total) by mouth daily.  30 tablet  0  . cetirizine (ZYRTEC) 10 MG tablet Take 10 mg by mouth daily. Patient alternates Zyrtec and Claritin every 90 days.      . cholecalciferol (VITAMIN D) 400 UNITS TABS Take 400 Units by mouth daily.      Marland Kitchen co-enzyme Q-10 30 MG capsule Take 100 mg by mouth daily.       Marland Kitchen desonide (DESOWEN) 0.05 % cream Apply topically as needed.      . fish oil-omega-3 fatty acids 1000 MG capsule Take 2 g by mouth daily.        . L-ARGININE PO Take 4,000 mg by mouth daily.       . meclizine (ANTIVERT) 12.5 MG tablet Take 12.5 mg by mouth as needed.      . metoprolol tartrate (LOPRESSOR) 25 MG tablet Take 1 tablet (25 mg total) by mouth 2 (two) times daily.  180 tablet  3  . Multiple Vitamin (MULTIVITAMIN) capsule Take 1 capsule by mouth daily.        . Psyllium (METAMUCIL PO) Take 2 tablets by mouth 3 (three) times daily.      Marland Kitchen RANEXA 500 MG 12 hr tablet TAKE 1 TABLET BY MOUTH EVERY DAY  90 tablet  3  . rosuvastatin (CRESTOR) 40 MG tablet Take 20 mg by mouth daily.         Allergies  Allergen Reactions  . Niaspan (Niacin Er)     Stomach intolerance    Lipid Panel     Component Value Date/Time   CHOL 171 11/30/2011 0839   TRIG 46.0  11/30/2011 0839   HDL 52.80 11/30/2011 0839   CHOLHDL 3 11/30/2011 0839   VLDL 9.2 11/30/2011 0839   LDLCALC 109* 11/30/2011 0839    Hepatic Function Panel     Component Value Date/Time   PROT 6.5 11/30/2011 0839   ALBUMIN 3.6 11/30/2011 0839   AST 27 11/30/2011 0839   ALT 22 11/30/2011 0839   ALKPHOS 53 11/30/2011 0839   BILITOT 0.5 11/30/2011 0839   BILIDIR 0.1 11/30/2011 1610

## 2011-12-16 ENCOUNTER — Encounter: Payer: Self-pay | Admitting: Cardiology

## 2011-12-24 ENCOUNTER — Other Ambulatory Visit: Payer: Self-pay | Admitting: Cardiovascular Disease

## 2011-12-24 NOTE — Telephone Encounter (Signed)
New Problem:    Patient called in needing a refill of RANEXA 500 MG 12 hr tablet. Patient is out of his medication.

## 2011-12-24 NOTE — Telephone Encounter (Signed)
Pt needs appointment then refill can be made Fax Received. Refill Completed. Joseph Soto (R.M.A)   

## 2012-02-10 HISTORY — PX: BALLOON SINUPLASTY: SHX5740

## 2012-03-29 ENCOUNTER — Encounter: Payer: Self-pay | Admitting: Cardiology

## 2012-04-26 ENCOUNTER — Telehealth: Payer: Self-pay | Admitting: Pharmacist

## 2012-04-26 ENCOUNTER — Other Ambulatory Visit: Payer: Self-pay | Admitting: Internal Medicine

## 2012-04-26 DIAGNOSIS — E7800 Pure hypercholesterolemia, unspecified: Secondary | ICD-10-CM

## 2012-04-26 NOTE — Telephone Encounter (Signed)
Pt called Lipid Clinic with questions regarding statin therapy.  Apparently he and his non-medical brother have been talking about how bad statins are for you and brother recommending patient have CPX test.  He may be talking about total creatinine kinase or crp c-reactive protein.   Pt is calling in regards of hearing statins are bad and red yeast rice is good and should he switch.  I have discussed risk vs benefit of statin tx and that RYR is low potency statin structure that is unregulated by FDA.  He dos complain of vague muscle fatigue that he can not say is statin related.   I have recommended that he do a 1 week trial of coming off Crestor and see if muscle fatigue subsides and restart crestor to see if it comes back.  He wants a TCK drawn prior to stopping statin.  He will come in on Monday for fasting lipids, lfts and TCK, then stop his statin x1 week and follow up with Kennon Rounds in Lipid Clinic the following week.

## 2012-05-02 ENCOUNTER — Other Ambulatory Visit (INDEPENDENT_AMBULATORY_CARE_PROVIDER_SITE_OTHER): Payer: Medicare Other

## 2012-05-02 DIAGNOSIS — E7800 Pure hypercholesterolemia, unspecified: Secondary | ICD-10-CM

## 2012-05-02 LAB — HEPATIC FUNCTION PANEL
ALT: 23 U/L (ref 0–53)
AST: 27 U/L (ref 0–37)
Albumin: 3.7 g/dL (ref 3.5–5.2)
Alkaline Phosphatase: 51 U/L (ref 39–117)
Bilirubin, Direct: 0.1 mg/dL (ref 0.0–0.3)
Total Bilirubin: 0.7 mg/dL (ref 0.3–1.2)
Total Protein: 6.5 g/dL (ref 6.0–8.3)

## 2012-05-02 LAB — LIPID PANEL
Cholesterol: 181 mg/dL (ref 0–200)
HDL: 45.7 mg/dL (ref >39.00)
LDL Cholesterol: 124 mg/dL — ABNORMAL HIGH (ref 0–99)
Total CHOL/HDL Ratio: 4
Triglycerides: 59 mg/dL (ref 0.0–149.0)
VLDL: 11.8 mg/dL (ref 0.0–40.0)

## 2012-05-09 ENCOUNTER — Ambulatory Visit (INDEPENDENT_AMBULATORY_CARE_PROVIDER_SITE_OTHER): Payer: Medicare Other | Admitting: Pharmacist

## 2012-05-09 DIAGNOSIS — E785 Hyperlipidemia, unspecified: Secondary | ICD-10-CM

## 2012-05-09 NOTE — Patient Instructions (Addendum)
If your CK is okay, will restart Crestor 20mg  once daily.  Call Dr. Eloise Harman about your lack of energy.  Possible causes: thyroid, low testosterone, low blood pressure or heart rate  Follow up with Dr. Clifton James

## 2012-05-11 ENCOUNTER — Other Ambulatory Visit: Payer: Self-pay | Admitting: *Deleted

## 2012-05-11 DIAGNOSIS — C8299 Follicular lymphoma, unspecified, extranodal and solid organ sites: Secondary | ICD-10-CM

## 2012-05-12 ENCOUNTER — Telehealth: Payer: Self-pay | Admitting: Oncology

## 2012-05-12 ENCOUNTER — Other Ambulatory Visit (HOSPITAL_BASED_OUTPATIENT_CLINIC_OR_DEPARTMENT_OTHER): Payer: Medicare Other | Admitting: Lab

## 2012-05-12 ENCOUNTER — Encounter (HOSPITAL_COMMUNITY): Payer: Self-pay

## 2012-05-12 ENCOUNTER — Ambulatory Visit: Payer: Medicare Other | Admitting: Oncology

## 2012-05-12 ENCOUNTER — Ambulatory Visit (HOSPITAL_COMMUNITY)
Admission: RE | Admit: 2012-05-12 | Discharge: 2012-05-12 | Disposition: A | Discharge status: Home / Self Care | Payer: Medicare Other | Source: Ambulatory Visit | Attending: Oncology | Admitting: Oncology

## 2012-05-12 DIAGNOSIS — C8299 Follicular lymphoma, unspecified, extranodal and solid organ sites: Secondary | ICD-10-CM

## 2012-05-12 DIAGNOSIS — J438 Other emphysema: Secondary | ICD-10-CM | POA: Insufficient documentation

## 2012-05-12 DIAGNOSIS — J984 Other disorders of lung: Secondary | ICD-10-CM | POA: Insufficient documentation

## 2012-05-12 DIAGNOSIS — R911 Solitary pulmonary nodule: Secondary | ICD-10-CM | POA: Insufficient documentation

## 2012-05-12 LAB — COMPREHENSIVE METABOLIC PANEL (CC13)
ALT: 20 U/L (ref 0–55)
AST: 29 U/L (ref 5–34)
Albumin: 3.4 g/dL — ABNORMAL LOW (ref 3.5–5.0)
Alkaline Phosphatase: 75 U/L (ref 40–150)
BUN: 28.4 mg/dL — ABNORMAL HIGH (ref 7.0–26.0)
CO2: 23 mEq/L (ref 22–29)
Calcium: 8.7 mg/dL (ref 8.4–10.4)
Chloride: 109 mEq/L — ABNORMAL HIGH (ref 98–107)
Creatinine: 1.2 mg/dL (ref 0.7–1.3)
Glucose: 98 mg/dl (ref 70–99)
Potassium: 4.7 mEq/L (ref 3.5–5.1)
Sodium: 140 mEq/L (ref 136–145)
Total Bilirubin: 0.47 mg/dL (ref 0.20–1.20)
Total Protein: 6.7 g/dL (ref 6.4–8.3)

## 2012-05-12 NOTE — Telephone Encounter (Signed)
Called pt and left message regarding MD visit , r/s from 4/3 to 5/1 per MD, pt will be called by nurse for result of CT today

## 2012-05-13 NOTE — Progress Notes (Signed)
HPI  Patient presents to clinic for follow up appointment with lipid clinic. He has been taking Crestor 20 mg daily and fish oil 2 g daily.  He recently complained of muscle pain and overall lack of energy that he thought may be coming from Crestor.  He stopped taking it on 3/22.  He states he has not noticed any improvement in this since stopping Crestor.  He has added citraline to his medication regimen and it seems to help with exercise tolerance.   Diet: He continues his high fat diet prescribed by Dr. Danny Lawless.   Exercise: He reports 1 hr of exercise 6 days/week consisting of 35-40 min of walking and 12-20 min of strength training.  On the day he is not exercising, he mows the grass and plays golf.  He has not exercised in about 3 weeks due to the lack of energy.   Current Outpatient Prescriptions on File Prior to Visit  Medication Sig Dispense Refill  . cetirizine (ZYRTEC) 10 MG tablet Take 10 mg by mouth daily. Patient alternates Zyrtec and Claritin every 90 days.      . cholecalciferol (VITAMIN D) 400 UNITS TABS Take 400 Units by mouth daily.      Marland Kitchen co-enzyme Q-10 30 MG capsule Take 100 mg by mouth daily.       Marland Kitchen desonide (DESOWEN) 0.05 % cream Apply topically as needed.      . fish oil-omega-3 fatty acids 1000 MG capsule Take 2 g by mouth daily.        . L-ARGININE PO Take 4,000 mg by mouth daily.       . meclizine (ANTIVERT) 12.5 MG tablet Take 12.5 mg by mouth as needed.      . metoprolol tartrate (LOPRESSOR) 25 MG tablet Take 1 tablet (25 mg total) by mouth 2 (two) times daily.  180 tablet  3  . Multiple Vitamin (MULTIVITAMIN) capsule Take 1 capsule by mouth daily.        Marland Kitchen RANEXA 500 MG 12 hr tablet TAKE 1 TABLET BY MOUTH EVERY DAY  90 tablet  1  . rosuvastatin (CRESTOR) 40 MG tablet Take 20 mg by mouth daily.        No current facility-administered medications on file prior to visit.    Allergies  Allergen Reactions  . Niaspan (Niacin Er)     Stomach intolerance

## 2012-05-13 NOTE — Assessment & Plan Note (Signed)
Pt's cholesterol stable on Crestor 20mg  daily.  TC- 181, TG-59, HDL-46, LDL- 124.  LFTs are WNL.  Pt's lack of energy likely not statin related since it did not improve while pt was off the Crestor for over a week.  Given his history of CAD, pt needs to remain on statin.  He is agreeable.  Discussed possible causes of this.  He has not seen Dr. Clifton James in over a year so will have him make an appt and discuss with PCP as well.  Will follow up if pt has any problems in the future.

## 2012-05-17 ENCOUNTER — Ambulatory Visit: Payer: Medicare Other | Admitting: Oncology

## 2012-05-23 ENCOUNTER — Telehealth: Payer: Self-pay | Admitting: *Deleted

## 2012-05-23 NOTE — Telephone Encounter (Signed)
Message copied by Wandalee Ferdinand on Mon May 23, 2012  8:47 AM ------      Message from: Thornton Papas B      Created: Wed May 18, 2012  8:47 PM       Please call patient, stable nodule(likely scarring), f/u as scheduled ------

## 2012-05-23 NOTE — Telephone Encounter (Signed)
Notified patient that pulmonary nodule is unchanged from 11/2011 nodule. Likely scarring per Dr. Truett Perna.

## 2012-05-29 ENCOUNTER — Other Ambulatory Visit: Payer: Self-pay | Admitting: Cardiovascular Disease

## 2012-06-01 ENCOUNTER — Other Ambulatory Visit: Payer: Self-pay | Admitting: *Deleted

## 2012-06-01 MED ORDER — ROSUVASTATIN CALCIUM 40 MG PO TABS: ORAL_TABLET | ORAL | Status: DC

## 2012-06-03 ENCOUNTER — Other Ambulatory Visit: Payer: Self-pay | Admitting: Cardiovascular Disease

## 2012-06-07 ENCOUNTER — Other Ambulatory Visit: Payer: Self-pay

## 2012-06-07 MED ORDER — METOPROLOL TARTRATE 25 MG PO TABS: 25.0000 mg | ORAL_TABLET | Freq: Two times a day (BID) | ORAL | Status: DC

## 2012-06-09 ENCOUNTER — Telehealth: Payer: Self-pay | Admitting: *Deleted

## 2012-06-09 ENCOUNTER — Ambulatory Visit: Payer: Medicare Other | Admitting: Oncology

## 2012-06-09 ENCOUNTER — Ambulatory Visit (INDEPENDENT_AMBULATORY_CARE_PROVIDER_SITE_OTHER): Payer: Medicare Other | Admitting: Cardiovascular Disease

## 2012-06-09 ENCOUNTER — Encounter: Payer: Self-pay | Admitting: Cardiovascular Disease

## 2012-06-09 VITALS — BP 112/74 | HR 71 | Ht 71.0 in | Wt 190.1 lb

## 2012-06-09 DIAGNOSIS — I25810 Atherosclerosis of coronary artery bypass graft(s) without angina pectoris: Secondary | ICD-10-CM

## 2012-06-09 MED ORDER — METOPROLOL TARTRATE 25 MG PO TABS: 12.5000 mg | ORAL_TABLET | Freq: Two times a day (BID) | ORAL | Status: DC

## 2012-06-09 MED ORDER — NITROGLYCERIN 0.4 MG SL SUBL: 0.4000 mg | SUBLINGUAL_TABLET | SUBLINGUAL | Status: DC | PRN

## 2012-06-09 NOTE — Patient Instructions (Addendum)
Your physician wants you to follow-up in:  6 months. You will receive a reminder letter in the mail two months in advance. If you don't receive a letter, please call our office to schedule the follow-up appointment.  Your physician has recommended you make the following change in your medication:  Decrease metoprolol to 12. 5 mg by mouth twice daily

## 2012-06-09 NOTE — Progress Notes (Signed)
History of Present Illness: 67 yo WM with history of CAD, Non-hodgkins Lymphoma, HLD here today for cardiac follow up. He has been followed in the past by Dr. Deborah Chalk. Last cath February 2010 with occluded SVG to Diagonal and occluded SVG to PDA with  LIMA to LAD patent. There was diffuse disease in distal vessels felt best to be managed medically. HIs CABG was in May 2007 by Dr. Dorris Fetch. Last stress test 2010 with inferior ischemia.  He is followed in our lipid clinic.   He tells me that he is doing well. He has had no chest pain or SOB. Denies any palpitations, near syncope or syncope. Plays golf 3-4 times per week. He does note fatigue at times but not during exertion.   Primary Care Physician: Jarome Matin  Last Lipid Profile:Lipid Panel     Component Value Date/Time   CHOL 181 05/02/2012 0752   TRIG 59.0 05/02/2012 0752   HDL 45.70 05/02/2012 0752   CHOLHDL 4 05/02/2012 0752   VLDL 11.8 05/02/2012 0752   LDLCALC 124* 05/02/2012 0752     Past Medical History  Diagnosis Date  . HLD (hyperlipidemia)   . Non Hodgkin's lymphoma   . Dizziness     1 episode   . CAD (coronary artery disease)     Last cath 2010 per Dr. Deborah Chalk with diffuse multivessel disease, small caliber vessels not felt to be suitable for PCI    Past Surgical History  Procedure Laterality Date  . Coronary artery bypass graft  5/07    x5. LV dysfunction.   . Cholecystectomy    . Tonsillectomy    . Baker cyst removal      Current Outpatient Prescriptions  Medication Sig Dispense Refill  . cetirizine (ZYRTEC) 10 MG tablet Take 10 mg by mouth daily. Patient alternates Zyrtec and Claritin every 90 days.      . cholecalciferol (VITAMIN D) 400 UNITS TABS Take 400 Units by mouth daily.      Marland Kitchen co-enzyme Q-10 30 MG capsule Take 100 mg by mouth daily.       Marland Kitchen desonide (DESOWEN) 0.05 % cream Apply topically as needed.      . fish oil-omega-3 fatty acids 1000 MG capsule Take 2 g by mouth daily.        . L-ARGININE  PO Take 4,000 mg by mouth daily.       . meclizine (ANTIVERT) 12.5 MG tablet Take 12.5 mg by mouth as needed.      . metoprolol tartrate (LOPRESSOR) 25 MG tablet Take 1 tablet (25 mg total) by mouth 2 (two) times daily.  180 tablet  3  . Multiple Vitamin (MULTIVITAMIN) capsule Take 1 capsule by mouth daily.        Marland Kitchen RANEXA 500 MG 12 hr tablet TAKE 1 TABLET BY MOUTH EVERY DAY  90 tablet  1  . rosuvastatin (CRESTOR) 40 MG tablet TAKE 1 TABLET (40 MG TOTAL) BY MOUTH DAILY.  30 tablet  6   No current facility-administered medications for this visit.    Allergies  Allergen Reactions  . Niaspan (Niacin Er)     Stomach intolerance    History   Social History  . Marital Status: Married    Spouse Name: N/A    Number of Children: N/A  . Years of Education: N/A   Occupational History  . Conservation officer, [employer]    Social History Main Topics  . Smoking status: Former Games developer  . Smokeless tobacco: Not on file  Comment: quit in 2007   . Alcohol Use: Not on file  . Drug Use: Not on file  . Sexually Active: Not on file   Other Topics Concern  . Not on file   Social History Narrative   Married, 2 children.   Runs an executive recruiting company.     Family History  Problem Relation Age of Onset  . Heart disease Father   . Heart attack Father     Review of Systems:  As stated in the HPI and otherwise negative.   BP 112/74  Pulse 71  Ht 5\' 11"  (1.803 m)  Wt 190 lb 1.9 oz (86.238 kg)  BMI 26.53 kg/m2  SpO2 94%  Physical Examination: General: Well developed, well nourished, NAD HEENT: OP clear, mucus membranes moist SKIN: warm, dry. No rashes. Neuro: No focal deficits Musculoskeletal: Muscle strength 5/5 all ext Psychiatric: Mood and affect normal Neck: No JVD, no carotid bruits, no thyromegaly, no lymphadenopathy. Lungs:Clear bilaterally, no wheezes, rhonci, crackles Cardiovascular: Regular rate and rhythm. No murmurs, gallops or rubs. Abdomen:Soft. Bowel sounds present.  Non-tender.  Extremities: No lower extremity edema. Pulses are 2 + in the bilateral DP/PT.  EKG: NSR, rate 71 bpm. Possible septal infarct.   Cardiac cath 03/20/08: 1. Left main coronary artery has mild irregularities, but no  significant focal stenosis.  1. The left anterior descending has a 70% stenosis proximally. There  arises a moderate diagonal vessel that has a 90% stenosis. Further  down the left anterior descending, there is a 95% focal stenosis.  There is a diagonal vessel that fills by bidirectional flow as well  as bidirectional flow into the distal left anterior descending with  internal mammary graft.  1. Left circumflex. Left circumflex bifurcates almost at its ostium.  The obtuse marginal was a previously bypass graft and it has 30-40%  narrowing. It has good distal runoff. The circumflex branch  proceeds in the AV groove has an ostial 70% stenosis.  1. The right coronary artery is a large dominant vessel. There is a  30-40% scattered narrowing before the crux and 50% narrowing just  before the crux. There is a 70% ostial narrowing in the second  posterior descending and a 95% stenosis in the midportion of the  small vessels. There is 30-40% stenosis in the first posterior  descending. There is 80-90% stenosis in ostial portion of the  small RV branch.  1. Saphenous vein graft to posterior descending and posterolateral  branch is occluded proximally.  1. Saphenous vein graft to the intermediate diagonal vessel was  occluded proximally.  1. The left internal mammary artery to the LAD is patent with  excellent flow.  OVERALL IMPRESSION:  1. Normal left ventricular function.  2. Three-vessel coronary atherosclerosis as described above.  3. Patent left internal mammary artery to the left anterior descending  with occluded saphenous vein graft to the intermediate diagonal  sequentially and an occluded saphenous vein graft to posterior  descending and posterolateral  branch sequentially.  DISCUSSION: In general, Joseph Soto has multiple small vessels. Most  of the branches that are in the 2 mm range at most with proximal vessels  being not much more than 2.5 mm in diameter. The diagonal vessel is of  concern because there is stenosis in the left anterior descending both  before and after the diagonal, and there is a severe stenosis in the  diagonal vessel. However, I do not think that vessel would be suitable  for stent placement.  He does have stenosis in the ostial branch of the  circumflex that runs in the AV groove, but this would also be a very  difficult vessel to perform suitable angioplasty. The findings in the  right coronary artery involve diffuse distal disease and would almost  certainly be best managed medically.  Echo 09/01/10: Left ventricle: The cavity size was normal. Wall thickness was normal. The estimated ejection fraction was 50%. Wall motion was normal; there were no regional wall motion abnormalities. Doppler parameters are consistent with abnormal left ventricular relaxation (grade 1 diastolic dysfunction).  Assessment and Plan:   1. CAD: Stable. Continue ASA 81 mg po Qdaily. Will continue metoprolol at current dose. Continue statin. Continue Ranexa. I have spent 30 minutes reviewing his cath films showing him the severity of his disease. 4 grafts occluded with patent IMA to LAD. He has diffuse distal RCA disease, ostial Circumflex in small caliber vessel. Continue medical therapy for now. SL NTG to use prn.   2. Hyperlipidemia:  Continue statin. He is followed in the lipid clinic.

## 2012-06-09 NOTE — Telephone Encounter (Signed)
Left VM he was already aware CT scan results are negative. Asking if he really needs to come in today? Requests return call. Message retrieved at 1140 today.

## 2012-06-10 ENCOUNTER — Telehealth: Payer: Self-pay | Admitting: Oncology

## 2012-06-10 NOTE — Telephone Encounter (Signed)
Pt lmonvm 5/1 to cx his appt due to he had already been called w/his results. Unsure if pt needs additional f/u.

## 2012-07-09 ENCOUNTER — Other Ambulatory Visit: Payer: Self-pay | Admitting: Physician Assistant

## 2012-07-09 ENCOUNTER — Telehealth: Payer: Self-pay | Admitting: Physician Assistant

## 2012-07-09 ENCOUNTER — Other Ambulatory Visit: Payer: Self-pay | Admitting: Cardiovascular Disease

## 2012-07-09 MED ORDER — RANOLAZINE ER 500 MG PO TB12: 500.0000 mg | ORAL_TABLET | Freq: Every day | ORAL | Status: DC

## 2012-07-09 NOTE — Telephone Encounter (Signed)
Pt called answering service on Saturday, did not realize he ran out of Ranexa. I informed patient we do not typically supply refills on weekend but I understand things can come up. Chart reviewed. Recently saw McAlhany who recommended to continue Ranexa. QTc OK. Med refilled as he was previously taken, electronically prescribed to [facility] at [facility]. Pt verbalized understanding and gratitude. Dayna Dunn PA-C

## 2012-07-09 NOTE — Telephone Encounter (Signed)
Pt called answering service on Saturday. Did not realize he ran out of Ranexa, is requesting refill. I informed him we do not typically refill med on weekend but I can help. Chart reviewed - Saw McAlhany recently who recommended to continue Ranexa. Qtc was OK that that visit. [facility] by [facility]. He verbalized understanding and gratitude.   Of note, this is a duplicate of prior phone note. My previous note disappeared so disregard if it shows up a second time. Jannice Beitzel PA-C

## 2012-09-14 ENCOUNTER — Other Ambulatory Visit: Payer: Self-pay

## 2012-11-08 ENCOUNTER — Telehealth: Payer: Self-pay | Admitting: Cardiovascular Disease

## 2012-11-08 ENCOUNTER — Encounter: Payer: Self-pay | Admitting: Cardiovascular Disease

## 2012-11-08 NOTE — Telephone Encounter (Signed)
New Problem  Pt states will have sinus surgery next week... Needs cardiac clearance for anesthesia. Request a call back confirming the fax was sent.   Dr. Charolotte Eke office Fax# [phone]

## 2012-11-08 NOTE — Telephone Encounter (Signed)
Joseph Soto, Letter written. Can we make sure the patient is feeling well and let him know we have faxed the letter? Thanks, chris

## 2012-11-08 NOTE — Telephone Encounter (Signed)
Left message for pt to call back  °

## 2012-11-09 NOTE — Telephone Encounter (Signed)
Spoke with pt. Other than sinus issues he is feeling well. I told him I would fax clearance letter this morning.

## 2012-11-17 ENCOUNTER — Ambulatory Visit: Payer: Self-pay | Admitting: Otolaryngology

## 2012-11-28 ENCOUNTER — Telehealth: Payer: Self-pay | Admitting: Cardiovascular Disease

## 2012-11-28 NOTE — Telephone Encounter (Signed)
Patient needs to know what he can take for decongestive? He did get a call back telling him to take Coraseeden and that is not a decongestive.

## 2012-11-28 NOTE — Telephone Encounter (Signed)
MD suggest to ask Trinda Pascal PH-D for recommendations. Per sally, Pt is to take Mucinex-D and to watch for increased  BP. Pt  Is the call the office if any issues. Pt aware and verbalized understanding.

## 2012-11-28 NOTE — Telephone Encounter (Signed)
Pt is calling again to get a decongestant for nasal congestion . Pt had sinus surgery and pt states that even if he needs to take an extra Metoprolol to for BP he needs a decongestant ASAP. I wasggested Coricidin  HBP, Pt states that is not a decongestIn  AND  WON'T HELP. Pt states the surgeon recommended a Mucinex D. Pt is aware that this message will be send to MD for reviewing.

## 2012-11-28 NOTE — Telephone Encounter (Signed)
New problem:  Pt states he wants to know if he can use a decongestant... Pt states he recently had surgery on his sinuses. Please advise

## 2012-11-28 NOTE — Telephone Encounter (Signed)
Pt. Called because he had a sinus surgery but his sinus congestion has not clear up yet and he wants to know if he can take a decongest like musinex. Pt is aware These  medication probable will race his heart rate,and he can take Chrolosiden HBP. Pt verbalized understanding.

## 2012-11-28 NOTE — Telephone Encounter (Signed)
I would prefer that he have his sinus congestion treated by his surgeon post sinus surgery. Would you mind checking with Trinda Pascal to see what suggestions we have? Thanks, chris

## 2012-11-30 ENCOUNTER — Ambulatory Visit: Payer: Self-pay | Admitting: Otolaryngology

## 2012-12-15 ENCOUNTER — Other Ambulatory Visit: Payer: Self-pay

## 2013-01-12 ENCOUNTER — Other Ambulatory Visit: Payer: Self-pay | Admitting: Physician Assistant

## 2013-01-16 ENCOUNTER — Telehealth: Payer: Self-pay | Admitting: *Deleted

## 2013-01-16 NOTE — Telephone Encounter (Signed)
Received call from pt who reports he is having a terrible time with his sinuses. Recently had sinus surgery. He reports his allergist is not making his allergy serum at full strength. This is because if he has a reaction to shot he would need epi-pen and the effectiveness of epi-pen is decreased due to metoprolol.  The lower strength serum decreases the risk of reaction to injection. Pt reports his allergist has asked if he can be switched from beta blocker to another category of medication. Pt reports another alternative would be for him to hold metoprolol the day of injections.  Pt would like to know what Dr. Clifton James recommends.

## 2013-01-16 NOTE — Telephone Encounter (Signed)
Left message to call back  

## 2013-01-16 NOTE — Telephone Encounter (Signed)
He can try holding his Lopressor but if he has more angina, would need to resume. Joseph Soto

## 2013-01-17 NOTE — Telephone Encounter (Signed)
Left message to call back  

## 2013-01-19 NOTE — Telephone Encounter (Signed)
Spoke with pt and gave him information from Dr. Clifton James. He will hold lopressor day of allergy shots and day before if required by allergist. He will let us know if he has angina.

## 2013-01-19 NOTE — Telephone Encounter (Signed)
Follow up    Returning nurses call from a few days ago

## 2013-01-23 ENCOUNTER — Emergency Department (HOSPITAL_COMMUNITY): Payer: Medicare Other

## 2013-01-23 ENCOUNTER — Encounter (HOSPITAL_COMMUNITY): Payer: Self-pay | Admitting: Emergency Medicine

## 2013-01-23 ENCOUNTER — Observation Stay (HOSPITAL_COMMUNITY)
Admission: EM | Admit: 2013-01-23 | Discharge: 2013-01-24 | Disposition: A | Discharge status: Home / Self Care | Payer: Medicare Other | Attending: Internal Medicine | Admitting: Internal Medicine

## 2013-01-23 DIAGNOSIS — J309 Allergic rhinitis, unspecified: Secondary | ICD-10-CM | POA: Insufficient documentation

## 2013-01-23 DIAGNOSIS — M545 Low back pain, unspecified: Secondary | ICD-10-CM

## 2013-01-23 DIAGNOSIS — R112 Nausea with vomiting, unspecified: Secondary | ICD-10-CM | POA: Insufficient documentation

## 2013-01-23 DIAGNOSIS — E785 Hyperlipidemia, unspecified: Secondary | ICD-10-CM | POA: Diagnosis present

## 2013-01-23 DIAGNOSIS — I255 Ischemic cardiomyopathy: Secondary | ICD-10-CM

## 2013-01-23 DIAGNOSIS — IMO0002 Reserved for concepts with insufficient information to code with codable children: Secondary | ICD-10-CM | POA: Diagnosis present

## 2013-01-23 DIAGNOSIS — I2510 Atherosclerotic heart disease of native coronary artery without angina pectoris: Secondary | ICD-10-CM | POA: Diagnosis present

## 2013-01-23 DIAGNOSIS — H8110 Benign paroxysmal vertigo, unspecified ear: Principal | ICD-10-CM | POA: Insufficient documentation

## 2013-01-23 DIAGNOSIS — R42 Dizziness and giddiness: Secondary | ICD-10-CM

## 2013-01-23 DIAGNOSIS — C8589 Other specified types of non-Hodgkin lymphoma, extranodal and solid organ sites: Secondary | ICD-10-CM | POA: Insufficient documentation

## 2013-01-23 LAB — CBC WITH DIFFERENTIAL/PLATELET
Basophils Absolute: 0 10*3/uL (ref 0.0–0.1)
Basophils Relative: 0 % (ref 0–1)
Eosinophils Absolute: 0 10*3/uL (ref 0.0–0.7)
Eosinophils Relative: 0 % (ref 0–5)
HCT: 39.6 % (ref 39.0–52.0)
Hemoglobin: 13.4 g/dL (ref 13.0–17.0)
Lymphocytes Relative: 14 % (ref 12–46)
Lymphs Abs: 1.2 10*3/uL (ref 0.7–4.0)
MCH: 30.7 pg (ref 26.0–34.0)
MCHC: 33.8 g/dL (ref 30.0–36.0)
MCV: 90.6 fL (ref 78.0–100.0)
Monocytes Absolute: 0.7 10*3/uL (ref 0.1–1.0)
Monocytes Relative: 7 % (ref 3–12)
Neutro Abs: 7.2 10*3/uL (ref 1.7–7.7)
Neutrophils Relative %: 79 % — ABNORMAL HIGH (ref 43–77)
Platelets: 266 10*3/uL (ref 150–400)
RBC: 4.37 MIL/uL (ref 4.22–5.81)
RDW: 14.4 % (ref 11.5–15.5)
WBC: 9.2 10*3/uL (ref 4.0–10.5)

## 2013-01-23 LAB — BASIC METABOLIC PANEL
BUN: 20 mg/dL (ref 6–23)
CO2: 23 mEq/L (ref 19–32)
Calcium: 8.9 mg/dL (ref 8.4–10.5)
Chloride: 105 mEq/L (ref 96–112)
Creatinine, Ser: 0.91 mg/dL (ref 0.50–1.35)
GFR calc Af Amer: >90 mL/min (ref >90)
GFR calc non Af Amer: 85 mL/min — ABNORMAL LOW (ref >90)
Glucose, Bld: 113 mg/dL — ABNORMAL HIGH (ref 70–99)
Potassium: 4.5 mEq/L (ref 3.5–5.1)
Sodium: 139 mEq/L (ref 135–145)

## 2013-01-23 LAB — POCT I-STAT TROPONIN I: Troponin i, poc: 0 ng/mL (ref 0.00–0.08)

## 2013-01-23 MED ORDER — IPRATROPIUM BROMIDE 0.06 % NA SOLN
2.0000 | Freq: Three times a day (TID) | NASAL | Status: DC
  Administered 2013-01-24: 2 via NASAL
  Filled 2013-01-23: qty 15

## 2013-01-23 MED ORDER — ONDANSETRON HCL 4 MG/2ML IJ SOLN
4.0000 mg | Freq: Once | INTRAMUSCULAR | Status: AC
  Administered 2013-01-23: 4 mg via INTRAVENOUS
  Filled 2013-01-23: qty 2

## 2013-01-23 MED ORDER — SODIUM CHLORIDE 0.9 % IV BOLUS (SEPSIS)
500.0000 mL | Freq: Once | INTRAVENOUS | Status: AC
  Administered 2013-01-23: 500 mL via INTRAVENOUS

## 2013-01-23 MED ORDER — METOCLOPRAMIDE HCL 5 MG/ML IJ SOLN
10.0000 mg | Freq: Once | INTRAMUSCULAR | Status: AC
  Administered 2013-01-23: 10 mg via INTRAVENOUS
  Filled 2013-01-23: qty 2

## 2013-01-23 MED ORDER — DIPHENHYDRAMINE HCL 50 MG/ML IJ SOLN
25.0000 mg | Freq: Once | INTRAMUSCULAR | Status: AC
  Administered 2013-01-23: 25 mg via INTRAVENOUS
  Filled 2013-01-23: qty 1

## 2013-01-23 MED ORDER — ONDANSETRON HCL 4 MG PO TABS: 4.0000 mg | ORAL_TABLET | Freq: Three times a day (TID) | ORAL | Status: DC | PRN

## 2013-01-23 MED ORDER — MECLIZINE HCL 12.5 MG PO TABS
12.5000 mg | ORAL_TABLET | Freq: Three times a day (TID) | ORAL | Status: DC | PRN
  Filled 2013-01-23: qty 1

## 2013-01-23 MED ORDER — ATORVASTATIN CALCIUM 80 MG PO TABS
80.0000 mg | ORAL_TABLET | Freq: Every day | ORAL | Status: DC
  Filled 2013-01-23: qty 1

## 2013-01-23 MED ORDER — METOPROLOL TARTRATE 12.5 MG HALF TABLET
12.5000 mg | ORAL_TABLET | Freq: Two times a day (BID) | ORAL | Status: DC
  Administered 2013-01-24 (×2): 12.5 mg via ORAL
  Filled 2013-01-23 (×3): qty 1

## 2013-01-23 MED ORDER — HYDROCODONE-ACETAMINOPHEN 5-325 MG PO TABS: 2.0000 | ORAL_TABLET | ORAL | Status: DC | PRN

## 2013-01-23 MED ORDER — NITROGLYCERIN 0.4 MG SL SUBL: 0.4000 mg | SUBLINGUAL_TABLET | SUBLINGUAL | Status: DC | PRN

## 2013-01-23 MED ORDER — MONTELUKAST SODIUM 10 MG PO TABS
10.0000 mg | ORAL_TABLET | Freq: Every day | ORAL | Status: DC
  Administered 2013-01-24: 10 mg via ORAL
  Filled 2013-01-23 (×2): qty 1

## 2013-01-23 MED ORDER — HYDROMORPHONE HCL PF 1 MG/ML IJ SOLN
1.0000 mg | Freq: Once | INTRAMUSCULAR | Status: AC
  Administered 2013-01-23: 1 mg via INTRAVENOUS
  Filled 2013-01-23: qty 1

## 2013-01-23 MED ORDER — RANOLAZINE ER 500 MG PO TB12
500.0000 mg | ORAL_TABLET | Freq: Every day | ORAL | Status: DC
  Administered 2013-01-24: 500 mg via ORAL
  Filled 2013-01-23: qty 1

## 2013-01-23 MED ORDER — MORPHINE SULFATE 2 MG/ML IJ SOLN
2.0000 mg | Freq: Once | INTRAMUSCULAR | Status: AC
  Administered 2013-01-23: 2 mg via INTRAVENOUS
  Filled 2013-01-23: qty 1

## 2013-01-23 MED ORDER — ENOXAPARIN SODIUM 40 MG/0.4ML ~~LOC~~ SOLN
40.0000 mg | SUBCUTANEOUS | Status: DC
Start: 2013-01-24 — End: 2013-01-24
  Administered 2013-01-24: 40 mg via SUBCUTANEOUS
  Filled 2013-01-23: qty 0.4

## 2013-01-23 MED ORDER — IOHEXOL 300 MG/ML  SOLN
25.0000 mL | INTRAMUSCULAR | Status: AC
  Administered 2013-01-23: 25 mL via ORAL

## 2013-01-23 MED ORDER — MECLIZINE HCL 25 MG PO TABS: 25.0000 mg | ORAL_TABLET | Freq: Three times a day (TID) | ORAL | Status: DC | PRN

## 2013-01-23 MED ORDER — ONDANSETRON 4 MG PO TBDP: ORAL_TABLET | ORAL | Status: DC

## 2013-01-23 MED ORDER — DIAZEPAM 5 MG/ML IJ SOLN
5.0000 mg | Freq: Once | INTRAMUSCULAR | Status: AC
  Administered 2013-01-23: 5 mg via INTRAVENOUS
  Filled 2013-01-23: qty 2

## 2013-01-23 MED ORDER — MORPHINE SULFATE 4 MG/ML IJ SOLN
4.0000 mg | Freq: Once | INTRAMUSCULAR | Status: AC
  Administered 2013-01-23: 4 mg via INTRAVENOUS
  Filled 2013-01-23: qty 1

## 2013-01-23 MED ORDER — MECLIZINE HCL 25 MG PO TABS
25.0000 mg | ORAL_TABLET | Freq: Once | ORAL | Status: AC
  Administered 2013-01-23: 25 mg via ORAL
  Filled 2013-01-23: qty 1

## 2013-01-23 NOTE — ED Notes (Signed)
Pt began vomiting when pt was ambulated to wheelchair to be discharged. Wife states she does not feel like he can go home.

## 2013-01-23 NOTE — ED Provider Notes (Signed)
CSN: [id number]     Arrival date & time 01/23/13  1255 History   First MD Initiated Contact with Patient 01/23/13 1309     Chief Complaint  Patient presents with  . Dizziness  . Nausea   HPI 67 year old man with PMH non-Hodgkin's lymphoma, CAD s/p CABG in 2007, HL who presents with dizziness and nausea since this morning.   Patient states that he was helping his wife move a Diplomatic Services operational officer this morning around 9:30a, bent down to do so and when he stood back up, he felt extremely dizzy, like room was spinning.  He then became extremely nauseous and has been vomiting since, no blood.  No fever, chest pain, shortness of breath, abdominal pain. Patient has history of vertigo and has had one similar episode/year for past 2-3 years but less severe than the present one.  He tried taking PO Zofran with no relief (but unclear if he threw up soon after).   He is no longer feeling dizzy, just extremely nauseous and "motion sick" if he moves his head.   Past Medical History  Diagnosis Date  . HLD (hyperlipidemia)   . Non Hodgkin's lymphoma   . Dizziness     1 episode   . CAD (coronary artery disease)     Last cath 2010 per Dr. Deborah Chalk with diffuse multivessel disease, small caliber vessels not felt to be suitable for PCI   Past Surgical History  Procedure Laterality Date  . Coronary artery bypass graft  5/07    x5. LV dysfunction.   . Cholecystectomy    . Tonsillectomy    . Baker cyst removal     Family History  Problem Relation Age of Onset  . Heart disease Father   . Heart attack Father    History  Substance Use Topics  . Smoking status: Former Games developer  . Smokeless tobacco: Not on file     Comment: quit in 2007   . Alcohol Use: No    Review of Systems  Constitutional: Negative for fever, chills and diaphoresis.  HENT: Negative for congestion.   Respiratory: Negative for cough and shortness of breath.   Cardiovascular: Negative for chest pain and leg swelling.  Gastrointestinal:  Positive for nausea and vomiting. Negative for abdominal pain, diarrhea and constipation.  Genitourinary: Negative for dysuria, frequency and hematuria.  Musculoskeletal: Negative for back pain and neck pain.  Neurological: Positive for dizziness. Negative for syncope, weakness and headaches.    Allergies  Niaspan  Home Medications   Current Outpatient Rx  Name  Route  Sig  Dispense  Refill  . cholecalciferol (VITAMIN D) 400 UNITS TABS   Oral   Take 400 Units by mouth daily.         Marland Kitchen co-enzyme Q-10 30 MG capsule   Oral   Take 100 mg by mouth daily.          . fish oil-omega-3 fatty acids 1000 MG capsule   Oral   Take 2 g by mouth daily.           Marland Kitchen ipratropium (ATROVENT) 0.06 % nasal spray   Nasal   Place 2 sprays into the nose 3 (three) times daily.         . L-CITRULLINE PO   Oral   Take 1 tablet by mouth daily.         . metoprolol tartrate (LOPRESSOR) 25 MG tablet   Oral   Take 0.5 tablets (12.5 mg total) by mouth 2 (  two) times daily.   90 tablet   3   . montelukast (SINGULAIR) 10 MG tablet   Oral   Take 10 mg by mouth at bedtime.         . Multiple Vitamin (MULTIVITAMIN) capsule   Oral   Take 1 capsule by mouth daily.           . ondansetron (ZOFRAN) 4 MG tablet   Oral   Take 4 mg by mouth every 8 (eight) hours as needed for nausea or vomiting.         . ranolazine (RANEXA) 500 MG 12 hr tablet   Oral   Take 500 mg by mouth daily.         . rosuvastatin (CRESTOR) 40 MG tablet   Oral   Take 40 mg by mouth daily.         . nitroGLYCERIN (NITROSTAT) 0.4 MG SL tablet   Sublingual   Place 1 tablet (0.4 mg total) under the tongue every 5 (five) minutes as needed for chest pain.   25 tablet   6    BP 133/63  Pulse 76  Temp(Src) 97.4 F (36.3 C) (Oral)  Resp 13  SpO2 98% Physical Exam  Constitutional: He is oriented to person, place, and time. He appears well-developed and well-nourished. He appears distressed.  Patient  actively vomiting during interview.   HENT:  Head: Normocephalic and atraumatic.  Eyes: Conjunctivae and EOM are normal. Pupils are equal, round, and reactive to light.  Neck: Normal range of motion. Neck supple.  Cardiovascular: Normal rate, regular rhythm and normal heart sounds.   Pulmonary/Chest: Effort normal and breath sounds normal.  Abdominal: Soft. Bowel sounds are normal. He exhibits no distension. There is no tenderness. There is no rebound and no guarding.  Musculoskeletal: Normal range of motion.  Neurological: He is alert and oriented to person, place, and time. No cranial nerve deficit.    ED Course  Procedures (including critical care time) Labs Review Labs Reviewed  BASIC METABOLIC PANEL - Abnormal; Notable for the following:    Glucose, Bld 113 (*)    GFR calc non Af Amer 85 (*)    All other components within normal limits  URINALYSIS, ROUTINE W REFLEX MICROSCOPIC  POCT I-STAT TROPONIN I   Imaging Review No results found.  EKG Interpretation   None       MDM  Dizziness, nausea/vomiting- most likely due to BPPV given history.  Gave Zofran 4 mg IV, Benadryl 25 mg IV, NS 500 cc bolus. EKG non-ischemic.  i-stat troponin negative. BMP within normal limits.  Need to walk patient once acute nausea/vomiting has resolved.   Flank pain- On re-evaluation, patient with acute right-sided flank pain, requesting pain meds.  No urinary symptoms, no history of kidney stones but agreeable to provide urine sample.  UA pending; morphine 2 mg IV, dialaudid 1 mg IV given for pain.  Non-contrast CT abdomen and pelvis pending.   Patient discussed with Dr. Denton Lank, turned over to Dr. Jodi Mourning at 3:30pm.    Rocco Serene, MD 01/23/13 [id number]

## 2013-01-23 NOTE — ED Notes (Signed)
Discharge and follow up instructions reviewed with pt. Pt verbalized understanding.  

## 2013-01-23 NOTE — ED Notes (Signed)
Patient to CT at this time

## 2013-01-23 NOTE — H&P (Signed)
@LOGO @ [facility] History and Physical  Patient: Joseph Soto  ZOX:[id number]  DOB: 1945-09-13  DOS: the patient was seen and examined on 01/23/2013 PCP: Jarome Matin  Chief Complaint: Vomiting and positional dizziness  HPI: DRAVIN LANCE is a 67 y.o. 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 The patient is coming from home. The patient is presenting with complaints of dizziness that has been ongoing since last 2 days. The symptoms are primarily when he moves his head from one position to the other position. He mentions his symptoms as not room spinning. He denies any palpitation, chest pain, shortness of breath, abdominal pain, diarrhea, constipation, burning urination with the symptoms. He does mention that he was feeling nauseous in the hospital and had an episode of vomiting. He also had some right-sided flank pain which is resolved at present. He mentions that he had undergone sinuplasty  few months ago, without any significant difference in his symptoms. few years ago he was having similar symptoms and at that time was diagnosed with excess endolymph and was asked to reduce his salt intake which has improved his symptoms   Review of Systems: as mentioned in the history of present illness.  A Comprehensive review of the other systems is negative.  Past Medical History  Diagnosis Date  . HLD (hyperlipidemia)   . Non Hodgkin's lymphoma   . Dizziness     1 episode   . CAD (coronary artery disease)     Last cath 2010 per Dr. Deborah Chalk with diffuse multivessel disease, small caliber vessels not felt to be suitable for PCI   Past Surgical History  Procedure Laterality Date  . Coronary artery bypass graft  5/07    x5. LV dysfunction.   . Cholecystectomy    . Tonsillectomy    . Baker cyst removal     Social History:  reports that he has quit smoking. He does not have any  smokeless tobacco history on file. He reports that he does not drink alcohol or use illicit drugs. Independent for most of his  ADL.  Allergies  Allergen Reactions  . Niaspan [Niacin Er]     Stomach intolerance    Family History  Problem Relation Age of Onset  . Heart disease Father   . Heart attack Father     Prior to Admission medications   Medication Sig Start Date End Date Taking? Authorizing Provider  cholecalciferol (VITAMIN D) 400 UNITS TABS Take 400 Units by mouth daily.   Yes Historical Provider, MD  co-enzyme Q-10 30 MG capsule Take 100 mg by mouth daily.    Yes Historical Provider, MD  fish oil-omega-3 fatty acids 1000 MG capsule Take 2 g by mouth daily.     Yes Historical Provider, MD  ipratropium (ATROVENT) 0.06 % nasal spray Place 2 sprays into the nose 3 (three) times daily.   Yes Historical Provider, MD  L-CITRULLINE PO Take 1 tablet by mouth daily.   Yes Historical Provider, MD  metoprolol tartrate (LOPRESSOR) 25 MG tablet Take 0.5 tablets (12.5 mg total) by mouth 2 (two) times daily. 06/09/12  Yes Kathleene Hazel, MD  montelukast (SINGULAIR) 10 MG tablet Take 10 mg by mouth at bedtime.   Yes Historical Provider, MD  Multiple Vitamin (MULTIVITAMIN) capsule Take 1 capsule by mouth daily.     Yes Historical Provider, MD  ondansetron (ZOFRAN) 4 MG tablet Take 4 mg by mouth every 8 (eight)  hours as needed for nausea or vomiting.   Yes Historical Provider, MD  ranolazine (RANEXA) 500 MG 12 hr tablet Take 500 mg by mouth daily.   Yes Historical Provider, MD  rosuvastatin (CRESTOR) 40 MG tablet Take 40 mg by mouth daily.   Yes Historical Provider, MD  HYDROcodone-acetaminophen (NORCO) 5-325 MG per tablet Take 2 tablets by mouth every 4 (four) hours as needed. 01/23/13   Enid Skeens, MD  meclizine (ANTIVERT) 25 MG tablet Take 1 tablet (25 mg total) by mouth 3 (three) times daily as needed for dizziness. 01/23/13   Enid Skeens, MD  nitroGLYCERIN (NITROSTAT) 0.4 MG  SL tablet Place 1 tablet (0.4 mg total) under the tongue every 5 (five) minutes as needed for chest pain. 06/09/12   Kathleene Hazel, MD  ondansetron (ZOFRAN ODT) 4 MG disintegrating tablet 4mg  ODT q4 hours prn nausea/vomit 01/23/13   Enid Skeens, MD    Physical Exam: Filed Vitals:   01/23/13 2100 01/23/13 2130 01/23/13 2230 01/23/13 2245  BP: 103/61 107/63 122/66 110/65  Pulse: 88 89 89 89  Temp:      TempSrc:      Resp:      SpO2: 93% 95% 93% 94%    General: Alert, Awake and Oriented to Time, Place and Person. Appear in mild distress Eyes: PERRL ENT: Oral Mucosa clear moist Neck: no JVD Cardiovascular: S1 and S2 Present, no Murmur, Peripheral Pulses Present Respiratory: Bilateral Air entry equal and Decreased, noClear to Auscultation,  no Crackles,no wheezes Abdomen: Bowel Sound Present, Soft and Non tender Skin: no Rash Extremities: no Pedal edema, no calf tenderness Neurologic: Grossly Unremarkable.  Labs on Admission:  CBC:  Recent Labs Lab 01/23/13 1944  WBC 9.2  NEUTROABS 7.2  HGB 13.4  HCT 39.6  MCV 90.6  PLT 266    CMP     Component Value Date/Time   NA 139 01/23/2013 1325   NA 140 05/12/2012 1003   K 4.5 01/23/2013 1325   K 4.7 05/12/2012 1003   CL 105 01/23/2013 1325   CL 109* 05/12/2012 1003   CO2 23 01/23/2013 1325   CO2 23 05/12/2012 1003   GLUCOSE 113* 01/23/2013 1325   GLUCOSE 98 05/12/2012 1003   BUN 20 01/23/2013 1325   BUN 28.4* 05/12/2012 1003   CREATININE 0.91 01/23/2013 1325   CREATININE 1.2 05/12/2012 1003   CALCIUM 8.9 01/23/2013 1325   CALCIUM 8.7 05/12/2012 1003   PROT 6.7 05/12/2012 1003   PROT 6.5 05/02/2012 0752   ALBUMIN 3.4* 05/12/2012 1003   ALBUMIN 3.7 05/02/2012 0752   AST 29 05/12/2012 1003   AST 27 05/02/2012 0752   ALT 20 05/12/2012 1003   ALT 23 05/02/2012 0752   ALKPHOS 75 05/12/2012 1003   ALKPHOS 51 05/02/2012 0752   BILITOT 0.47 05/12/2012 1003   BILITOT 0.7 05/02/2012 0752   GFRNONAA 85* 01/23/2013 1325   GFRAA >90 01/23/2013  1325    No results found for this basename: LIPASE, AMYLASE,  in the last 168 hours No results found for this basename: AMMONIA,  in the last 168 hours  No results found for this basename: CKTOTAL, CKMB, CKMBINDEX, TROPONINI,  in the last 168 hours BNP (last 3 results) No results found for this basename: PROBNP,  in the last 8760 hours  Radiological Exams on Admission: Ct Abdomen Pelvis Wo Contrast  01/23/2013   CLINICAL DATA:  Nausea, vomiting and dizziness with is not responding to medication. History of non-Hodgkin's  lymphoma and hyperlipidemia.  EXAM: CT ABDOMEN AND PELVIS WITHOUT CONTRAST  TECHNIQUE: Multidetector CT imaging of the abdomen and pelvis was performed following the standard protocol without intravenous contrast.  COMPARISON:  11/13/2011 and 08/14/2005 CT abdomen and pelvis. 05/12/2012 chest CT.  FINDINGS: Basilar parenchymal changes most suggestive of atelectasis combined with scarring.  Coronary artery calcifications.  Heart size within normal limits.  Atherosclerotic type changes with ectasia of the abdominal aorta measuring up to 2.4 cm. Common iliac artery fusiform dilation measuring up to 1.8 cm on the left and on the right. Moderate narrowing of the distal common iliac arteries greater on the left. Findings have progressed slightly since prior exam.  No evidence of adenopathy or splenomegaly. Borderline right external iliac lymph node with short axis dimension of 9 mm unchanged.  Taking into account limitation by non contrast imaging, no worrisome hepatic, splenic, pancreatic, adrenal or renal lesion. Mild haziness surrounding kidneys unchanged.  Post cholecystectomy. Prominence of the common bile duct may be related to post cholecystectomy state and only minimally more prominent than on the prior exam. No distal calcified common bile duct stone is noted.  Stomach is under distended without gross abnormality noted. Scattered colonic diverticula. No extra luminal bowel  inflammatory process, free fluid or free air. Portions of the colon are under distended and evaluation limited.  The appendix remains prominent in size containing low-density material but without extra luminal inflammation. 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  Non contrast filled views of the urinary bladder unremarkable. Prostate gland does not appear enlarged.  No bony destructive lesion.  IMPRESSION:.: IMPRESSION:. No evidence of adenopathy or splenomegaly. Borderline right external iliac lymph node with short axis dimension of 9 mm unchanged.  Post cholecystectomy. Prominence of the common bile duct may be related to post cholecystectomy state and only minimally more prominent than on the prior exam. No calcified calcified common bile duct stone is noted.  Stomach is under distended without gross abnormality noted. Scattered colonic diverticula. No extra luminal bowel inflammatory process, free fluid or free air. Portions of the colon are under distended and evaluation limited.  The appendix remains prominent in size containing low-density material but without extra luminal inflammation. 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  Atherosclerotic type changes coronary arteries, aorta are and common iliac arteries as detailed above.   Electronically Signed   By: Bridgett Larsson M.D.   On: 01/23/2013 17:54   Ct Head Wo Contrast  01/23/2013   CLINICAL DATA:  Dizziness and nausea. History of non-Hodgkin's lymphoma.  EXAM: CT HEAD WITHOUT CONTRAST  TECHNIQUE: Contiguous axial images were obtained from the base of the skull through the vertex without intravenous contrast.  COMPARISON:  CT of head Jul 02, 2006.  FINDINGS: The ventricles and sulci are normal for age. No intraparenchymal hemorrhage, mass effect nor midline shift. Patchy supratentorial white matter hypodensities are less than expected for patient's age and though  non-specific suggest sequelae of chronic small vessel ischemic disease. No acute large vascular territory infarcts. Patchy hypodensity within the pons favors beam hardening artifact from the adjacent calvarium.  No abnormal extra-axial fluid collections. Basal cisterns are patent. Moderate calcific atherosclerosis of the carotid siphons.  No skull fracture. Visualized paranasal sinuses and mastoid aircells are well-aerated. The included ocular globes and orbital contents are non-suspicious. Status post left ocular lens implant. 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  IMPRESSION: No acute  intracranial process.  Involutional changes. Mild white matter changes suggest chronic small vessel ischemic disease.   Electronically Signed   By: Awilda Metro   On: 01/23/2013 21:56    EKG: Independently reviewed. normal sinus rhythm.  Assessment/Plan Principal Problem:   Positional vertigo Active Problems:   CAD (coronary artery disease)   Hyperlipidemia   1. Positional vertigo The patient is presenting with complaints of positional vertigo which appears periheral. at present his symptoms are significantly improved. CT scan of the head does not show any acute abnormality. he continues to have some dizziness and lightheadedness . He will be admitted for observation. I will consult physical therapy and occupational therapy for vestibular rehabilitation  I would also continue him on meclizine for dizziness  The other possible etiologies excess endolymph , at present we'll continue to monitor   2.Coronary artery disease Continue adnexa, Lopressor: Lipitor  Consults:  PTOT   DVT Prophylaxis: subcutaneous Heparin Nutrition:  cardiac diet   Code Status:  full   Family Communication: wife  was present at bedside, opportunity was given to ask question and all questions were answered satisfactorily at the time of interview. Disposition: Admitted to observation in med surge  unit.  Author: Lynden Oxford, MD [facility] Pager: [phone] 01/23/2013, 11:23 PM    If 7PM-7AM, please contact night-coverage [email or web address] Password [email or web address]

## 2013-01-23 NOTE — ED Notes (Signed)
Pt reports that in August of 2014 his "head felt like a balloon" and was seen by an ENT to have sinus surgery and that did not help. States he "had an MRI done 2 months ago and that was negative but I'm still concerned because the head fullness is persistent."

## 2013-01-23 NOTE — ED Notes (Signed)
Pt transported and returned from CT

## 2013-01-23 NOTE — ED Notes (Signed)
Dr. Kem Kays at bedside

## 2013-01-23 NOTE — ED Notes (Signed)
Pt c/o nausea/vomiting and dizziness that has not gotten better with odt zofran.

## 2013-01-23 NOTE — ED Notes (Signed)
Pt returned from CT. States he still has some nausea and some dizziness from moving.

## 2013-01-23 NOTE — ED Notes (Signed)
Patient took odt Zofran at home this am with improvement, patient nausea has return, no active vomiting at this time

## 2013-01-23 NOTE — ED Provider Notes (Signed)
Signed out to follow up CT results and dispo. CT reviewed, no acute findings to explain right flank pain. Examined patient, pain right paraspinal lower thoracic and lumbar, no midline tenderness, abd soft/NT/ ND, nl strength/ sensation/ reflexes LE, CNs intact, no nystagmus, visual fields intact.  With unremarkable CT it appears pain may be MSK.  Repeat pain meds and recheck. Vertigo sxs similar to previous but more severe, only with head movement.  Normal neuro, pt improved, mild nausea. Pt improved on recheck.  Normal neuro exam.  Close fup discussed, offered medicine consult, pt refused and will fup with pcp and PT.  CT no acute findings. Ct Abdomen Pelvis Wo Contrast  01/23/2013   CLINICAL DATA:  Nausea, vomiting and dizziness with is not responding to medication. History of non-Hodgkin's lymphoma and hyperlipidemia.  EXAM: CT ABDOMEN AND PELVIS WITHOUT CONTRAST  TECHNIQUE: Multidetector CT imaging of the abdomen and pelvis was performed following the standard protocol without intravenous contrast.  COMPARISON:  11/13/2011 and 08/14/2005 CT abdomen and pelvis. 05/12/2012 chest CT.  FINDINGS: Basilar parenchymal changes most suggestive of atelectasis combined with scarring.  Coronary artery calcifications.  Heart size within normal limits.  Atherosclerotic type changes with ectasia of the abdominal aorta measuring up to 2.4 cm. Common iliac artery fusiform dilation measuring up to 1.8 cm on the left and on the right. Moderate narrowing of the distal common iliac arteries greater on the left. Findings have progressed slightly since prior exam.  No evidence of adenopathy or splenomegaly. Borderline right external iliac lymph node with short axis dimension of 9 mm unchanged.  Taking into account limitation by non contrast imaging, no worrisome hepatic, splenic, pancreatic, adrenal or renal lesion. Mild haziness surrounding kidneys unchanged.  Post cholecystectomy. Prominence of the common bile duct may be  related to post cholecystectomy state and only minimally more prominent than on the prior exam. No distal calcified common bile duct stone is noted.  Stomach is under distended without gross abnormality noted. Scattered colonic diverticula. No extra luminal bowel inflammatory process, free fluid or free air. Portions of the colon are under distended and evaluation limited.  The appendix remains prominent in size containing low-density material but without extra luminal inflammation. 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  Non contrast filled views of the urinary bladder unremarkable. Prostate gland does not appear enlarged.  No bony destructive lesion.  IMPRESSION:.: IMPRESSION:. No evidence of adenopathy or splenomegaly. Borderline right external iliac lymph node with short axis dimension of 9 mm unchanged.  Post cholecystectomy. Prominence of the common bile duct may be related to post cholecystectomy state and only minimally more prominent than on the prior exam. No calcified calcified common bile duct stone is noted.  Stomach is under distended without gross abnormality noted. Scattered colonic diverticula. No extra luminal bowel inflammatory process, free fluid or free air. Portions of the colon are under distended and evaluation limited.  The appendix remains prominent in size containing low-density material but without extra luminal inflammation. 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  Atherosclerotic type changes coronary arteries, aorta are and common iliac arteries as detailed above.   Electronically Signed   By: Bridgett Larsson M.D.   On: 01/23/2013 17:54    Vertigo, Right flank pain  Enid Skeens, MD 01/23/13 1925

## 2013-01-23 NOTE — ED Provider Notes (Signed)
Pt w hx vertigo, presented with dizziness, similar to prior vertigo, upon bending over the look under piece furniture, and standing up abruptly.  No change balance or coordination. No change in speech or vision. No headaches. No hearing loss or tinnitus.   Pt given meds in ed, symptoms improved on recheck.  Pt subsequently noted acute onset left flank pain. abd soft nt. Prior records review incl prior ct - no hx aaa, no hx renal stones.   abd soft nt.  No chest pain or sob.   ua pending. Ct pending.   1705, ct and ua remain pending.  Signed out to Dr Jodi Mourning to check labs and ct when back, recheck pt, recheck abd, ambulate, and dispo appropriately.     Suzi Roots, MD 01/23/13 [id number]

## 2013-01-23 NOTE — ED Notes (Signed)
Patient states dizziness with associated nausea thi am, patient with history of vertigo

## 2013-01-24 DIAGNOSIS — M545 Low back pain, unspecified: Secondary | ICD-10-CM

## 2013-01-24 DIAGNOSIS — H8110 Benign paroxysmal vertigo, unspecified ear: Secondary | ICD-10-CM

## 2013-01-24 DIAGNOSIS — I2510 Atherosclerotic heart disease of native coronary artery without angina pectoris: Secondary | ICD-10-CM

## 2013-01-24 DIAGNOSIS — E785 Hyperlipidemia, unspecified: Secondary | ICD-10-CM

## 2013-01-24 DIAGNOSIS — R42 Dizziness and giddiness: Secondary | ICD-10-CM

## 2013-01-24 DIAGNOSIS — I2589 Other forms of chronic ischemic heart disease: Secondary | ICD-10-CM

## 2013-01-24 LAB — CBC WITH DIFFERENTIAL/PLATELET
Basophils Absolute: 0.1 10*3/uL (ref 0.0–0.1)
Basophils Relative: 1 % (ref 0–1)
Eosinophils Absolute: 0.3 10*3/uL (ref 0.0–0.7)
Eosinophils Relative: 3 % (ref 0–5)
HCT: 36.6 % — ABNORMAL LOW (ref 39.0–52.0)
Hemoglobin: 12 g/dL — ABNORMAL LOW (ref 13.0–17.0)
Lymphocytes Relative: 29 % (ref 12–46)
Lymphs Abs: 2.7 10*3/uL (ref 0.7–4.0)
MCH: 30.1 pg (ref 26.0–34.0)
MCHC: 32.8 g/dL (ref 30.0–36.0)
MCV: 91.7 fL (ref 78.0–100.0)
Monocytes Absolute: 1 10*3/uL (ref 0.1–1.0)
Monocytes Relative: 11 % (ref 3–12)
Neutro Abs: 5.1 10*3/uL (ref 1.7–7.7)
Neutrophils Relative %: 56 % (ref 43–77)
Platelets: 256 10*3/uL (ref 150–400)
RBC: 3.99 MIL/uL — ABNORMAL LOW (ref 4.22–5.81)
RDW: 14.7 % (ref 11.5–15.5)
WBC: 9 10*3/uL (ref 4.0–10.5)

## 2013-01-24 LAB — COMPREHENSIVE METABOLIC PANEL
ALT: 19 U/L (ref 0–53)
AST: 23 U/L (ref 0–37)
Albumin: 3.1 g/dL — ABNORMAL LOW (ref 3.5–5.2)
Alkaline Phosphatase: 44 U/L (ref 39–117)
BUN: 14 mg/dL (ref 6–23)
CO2: 21 mEq/L (ref 19–32)
Calcium: 8.2 mg/dL — ABNORMAL LOW (ref 8.4–10.5)
Chloride: 107 mEq/L (ref 96–112)
Creatinine, Ser: 0.96 mg/dL (ref 0.50–1.35)
GFR calc Af Amer: >90 mL/min (ref >90)
GFR calc non Af Amer: 83 mL/min — ABNORMAL LOW (ref >90)
Glucose, Bld: 81 mg/dL (ref 70–99)
Potassium: 3.8 mEq/L (ref 3.5–5.1)
Sodium: 139 mEq/L (ref 135–145)
Total Bilirubin: 0.6 mg/dL (ref 0.3–1.2)
Total Protein: 5.8 g/dL — ABNORMAL LOW (ref 6.0–8.3)

## 2013-01-24 LAB — PROTIME-INR
INR: 1.09 (ref 0.00–1.49)
Prothrombin Time: 13.9 seconds (ref 11.6–15.2)

## 2013-01-24 LAB — GLUCOSE, CAPILLARY
Glucose-Capillary: 89 mg/dL (ref 70–99)
Glucose-Capillary: 86 mg/dL (ref 70–99)
Glucose-Capillary: 97 mg/dL (ref 70–99)

## 2013-01-24 MED ORDER — TRIAMCINOLONE ACETONIDE 55 MCG/ACT NA AERO: 1.0000 | INHALATION_SPRAY | Freq: Every day | NASAL | Status: DC

## 2013-01-24 MED ORDER — TRIAMCINOLONE ACETONIDE 55 MCG/ACT NA AERO
1.0000 | INHALATION_SPRAY | Freq: Every day | NASAL | Status: DC
  Filled 2013-01-24: qty 16.5

## 2013-01-24 MED ORDER — FLUTICASONE PROPIONATE 50 MCG/ACT NA SUSP
1.0000 | Freq: Every day | NASAL | Status: DC
  Filled 2013-01-24: qty 16

## 2013-01-24 MED ORDER — DIAZEPAM 5 MG PO TABS: 5.0000 mg | ORAL_TABLET | Freq: Two times a day (BID) | ORAL | Status: DC | PRN

## 2013-01-24 MED ORDER — ACETAMINOPHEN 325 MG PO TABS
650.0000 mg | ORAL_TABLET | Freq: Four times a day (QID) | ORAL | Status: DC | PRN
  Administered 2013-01-24: 650 mg via ORAL
  Filled 2013-01-24: qty 2

## 2013-01-24 MED ORDER — TRIAMCINOLONE ACETONIDE 55 MCG/ACT NA AERO
1.0000 | INHALATION_SPRAY | Freq: Every day | NASAL | Status: DC
  Administered 2013-01-24: 1 via NASAL
  Filled 2013-01-24: qty 16.5

## 2013-01-24 MED ORDER — OXYMETAZOLINE HCL 0.05 % NA SOLN
1.0000 | Freq: Two times a day (BID) | NASAL | Status: DC
  Administered 2013-01-24: 1 via NASAL
  Filled 2013-01-24: qty 15

## 2013-01-24 MED ORDER — DIAZEPAM 5 MG PO TABS
5.0000 mg | ORAL_TABLET | Freq: Two times a day (BID) | ORAL | Status: DC | PRN
  Administered 2013-01-24: 5 mg via ORAL
  Filled 2013-01-24: qty 1

## 2013-01-24 NOTE — Discharge Summary (Signed)
Physician Discharge Summary  Johnnie Goynes Kory ZOX:[id number] DOB: 1945/09/07 DOA: 01/23/2013  PCP: Jarome Matin  Admit date: 01/23/2013 Discharge date: 01/24/2013  Time spent: 40 minutes  Recommendations for Outpatient Follow-up:  1. Followup with primary care physician within one week  Discharge Diagnoses:  Principal Problem:   Positional vertigo Active Problems:   CAD (coronary artery disease)   Hyperlipidemia   Discharge Condition: Stable  Diet recommendation: Heart healthy low-sodium diet  Filed Weights   01/24/13 0108  Weight: 87.7 kg (193 lb 5.5 oz)    History of present illness:  Joseph Soto is a 67 y.o. 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  The patient is coming from home.  The patient is presenting with complaints of dizziness that has been ongoing since last 2 days. The symptoms are primarily when he moves his head from one position to the other position. He mentions his symptoms as not room spinning. He denies any palpitation, chest pain, shortness of breath, abdominal pain, diarrhea, constipation, burning urination with the symptoms. He does mention that he was feeling nauseous in the hospital and had an episode of vomiting. He also had some right-sided flank pain which is resolved at present.  He mentions that he had undergone sinuplasty few months ago, without any significant difference in his symptoms.  few years ago he was having similar symptoms and at that time was diagnosed with excess endolymph and was asked to reduce his salt intake which has improved his symptoms   Hospital Course:   1. Vertigo: Patient describes what appears like to be positional vertigo, patient has long history of allergies with congestion. He describes that whenever he feels very congested he get his head heavy and feel dizzy sometimes. At the time of admission CT scan of head was  done in the emergency department and showed good aeration of the paranasal and maxillary sinuses. PT/OT consulted because patient described that he in the past had history of vertigo and appears to be like Mnire's disease. PT recommended no PT followup, patient has negative Dix-Hallpike maneuver. Patient treated with Antivert and Valium. He requested a prescription for Valium as he think it did help his vertigo more than the Antivert. A prescription of 30 pills of 5 mg of Valium prescribed to take as needed twice a day. Patient to followup with primary care physician  2. Allergic rhinitis: As mentioned above patient has long history of allergy, he seen any neurologist, he is on Singulair and Nasacort at home. In the hospital Afrin was added because of congestion but was not prescribed and not encouraged to take as outpatient. Patient to followup with his immunologist.  3. CAD: No acute symptoms on medications continued.  Procedures:  None  Consultations:  PT/OT  Discharge Exam: Filed Vitals:   01/24/13 1457  BP: 116/70  Pulse: 73  Temp: 98.3 F (36.8 C)  Resp: 18   General: Alert and awake, oriented x3, not in any acute distress. HEENT: anicteric sclera, pupils reactive to light and accommodation, EOMI CVS: S1-S2 clear, no murmur rubs or gallops Chest: clear to auscultation bilaterally, no wheezing, rales or rhonchi Abdomen: soft nontender, nondistended, normal bowel sounds, no organomegaly Extremities: no cyanosis, clubbing or edema noted bilaterally Neuro: Cranial nerves II-XII intact, no focal neurological deficits  Discharge Instructions  Discharge Orders   Future Orders Complete By Expires   Diet - low sodium heart healthy  As directed  Increase activity slowly  As directed        Medication List         cholecalciferol 400 UNITS Tabs tablet  Commonly known as:  VITAMIN D  Take 400 Units by mouth daily.     co-enzyme Q-10 30 MG capsule  Take 100 mg by mouth  daily.     diazepam 5 MG tablet  Commonly known as:  VALIUM  Take 1 tablet (5 mg total) by mouth every 12 (twelve) hours as needed for anxiety.     fish oil-omega-3 fatty acids 1000 MG capsule  Take 2 g by mouth daily.     HYDROcodone-acetaminophen 5-325 MG per tablet  Commonly known as:  NORCO  Take 2 tablets by mouth every 4 (four) hours as needed.     ipratropium 0.06 % nasal spray  Commonly known as:  ATROVENT  Place 2 sprays into the nose 3 (three) times daily.     L-CITRULLINE PO  Take 1 tablet by mouth daily.     meclizine 25 MG tablet  Commonly known as:  ANTIVERT  Take 1 tablet (25 mg total) by mouth 3 (three) times daily as needed for dizziness.     metoprolol tartrate 25 MG tablet  Commonly known as:  LOPRESSOR  Take 0.5 tablets (12.5 mg total) by mouth 2 (two) times daily.     montelukast 10 MG tablet  Commonly known as:  SINGULAIR  Take 10 mg by mouth at bedtime.     multivitamin capsule  Take 1 capsule by mouth daily.     nitroGLYCERIN 0.4 MG SL tablet  Commonly known as:  NITROSTAT  Place 1 tablet (0.4 mg total) under the tongue every 5 (five) minutes as needed for chest pain.     ondansetron 4 MG disintegrating tablet  Commonly known as:  ZOFRAN ODT  4mg  ODT q4 hours prn nausea/vomit     ondansetron 4 MG tablet  Commonly known as:  ZOFRAN  Take 4 mg by mouth every 8 (eight) hours as needed for nausea or vomiting.     ranolazine 500 MG 12 hr tablet  Commonly known as:  RANEXA  Take 500 mg by mouth daily.     rosuvastatin 40 MG tablet  Commonly known as:  CRESTOR  Take 40 mg by mouth daily.     triamcinolone 55 MCG/ACT Aero nasal inhaler  Commonly known as:  NASACORT  Place 1 spray into the nose daily.       Allergies  Allergen Reactions  . Niaspan [Niacin Er]     Stomach intolerance       Follow-up Information   Follow up with PATERSON, DANIEL. Call in 2 days.   Specialty:  Surgery   Contact information:   [place]       Follow up  with [facility]. Call in 2 days.   Contact information:   [address] [place] [address] [phone]       The results of significant diagnostics from this hospitalization (including imaging, microbiology, ancillary and laboratory) are listed below for reference.    Significant Diagnostic Studies: Ct Abdomen Pelvis Wo Contrast  01/23/2013   CLINICAL DATA:  Nausea, vomiting and dizziness with is not responding to medication. History of non-Hodgkin's lymphoma and hyperlipidemia.  EXAM: CT ABDOMEN AND PELVIS WITHOUT CONTRAST  TECHNIQUE: Multidetector CT imaging of the abdomen and pelvis was performed following the standard protocol without intravenous contrast.  COMPARISON:  11/13/2011 and  08/14/2005 CT abdomen and pelvis. 05/12/2012 chest CT.  FINDINGS: Basilar parenchymal changes most suggestive of atelectasis combined with scarring.  Coronary artery calcifications.  Heart size within normal limits.  Atherosclerotic type changes with ectasia of the abdominal aorta measuring up to 2.4 cm. Common iliac artery fusiform dilation measuring up to 1.8 cm on the left and on the right. Moderate narrowing of the distal common iliac arteries greater on the left. Findings have progressed slightly since prior exam.  No evidence of adenopathy or splenomegaly. Borderline right external iliac lymph node with short axis dimension of 9 mm unchanged.  Taking into account limitation by non contrast imaging, no worrisome hepatic, splenic, pancreatic, adrenal or renal lesion. Mild haziness surrounding kidneys unchanged.  Post cholecystectomy. Prominence of the common bile duct may be related to post cholecystectomy state and only minimally more prominent than on the prior exam. No distal calcified common bile duct stone is noted.  Stomach is under distended without gross abnormality noted. Scattered colonic diverticula. No extra luminal bowel inflammatory process, free fluid or  free air. Portions of the colon are under distended and evaluation limited.  The appendix remains prominent in size containing low-density material but without extra luminal inflammation. 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  Non contrast filled views of the urinary bladder unremarkable. Prostate gland does not appear enlarged.  No bony destructive lesion.  IMPRESSION:.: IMPRESSION:. No evidence of adenopathy or splenomegaly. Borderline right external iliac lymph node with short axis dimension of 9 mm unchanged.  Post cholecystectomy. Prominence of the common bile duct may be related to post cholecystectomy state and only minimally more prominent than on the prior exam. No calcified calcified common bile duct stone is noted.  Stomach is under distended without gross abnormality noted. Scattered colonic diverticula. No extra luminal bowel inflammatory process, free fluid or free air. Portions of the colon are under distended and evaluation limited.  The appendix remains prominent in size containing low-density material but without extra luminal inflammation. 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  Atherosclerotic type changes coronary arteries, aorta are and common iliac arteries as detailed above.   Electronically Signed   By: Bridgett Larsson M.D.   On: 01/23/2013 17:54   Ct Head Wo Contrast  01/23/2013   CLINICAL DATA:  Dizziness and nausea. History of non-Hodgkin's lymphoma.  EXAM: CT HEAD WITHOUT CONTRAST  TECHNIQUE: Contiguous axial images were obtained from the base of the skull through the vertex without intravenous contrast.  COMPARISON:  CT of head Jul 02, 2006.  FINDINGS: The ventricles and sulci are normal for age. No intraparenchymal hemorrhage, mass effect nor midline shift. Patchy supratentorial white matter hypodensities are less than expected for patient's age and though non-specific suggest sequelae of chronic  small vessel ischemic disease. No acute large vascular territory infarcts. Patchy hypodensity within the pons favors beam hardening artifact from the adjacent calvarium.  No abnormal extra-axial fluid collections. Basal cisterns are patent. Moderate calcific atherosclerosis of the carotid siphons.  No skull fracture. Visualized paranasal sinuses and mastoid aircells are well-aerated. The included ocular globes and orbital contents are non-suspicious. Status post left ocular lens implant. 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  IMPRESSION: No acute intracranial process.  Involutional changes. Mild white matter changes suggest chronic small vessel ischemic disease.   Electronically Signed   By: Awilda Metro   On: 01/23/2013 21:56    Microbiology: No  results found for this or any previous visit (from the past 240 hour(s)).   Labs: Basic Metabolic Panel:  Recent Labs Lab 01/23/13 1325 01/24/13 0620  NA 139 139  K 4.5 3.8  CL 105 107  CO2 23 21  GLUCOSE 113* 81  BUN 20 14  CREATININE 0.91 0.96  CALCIUM 8.9 8.2*   Liver Function Tests:  Recent Labs Lab 01/24/13 0620  AST 23  ALT 19  ALKPHOS 44  BILITOT 0.6  PROT 5.8*  ALBUMIN 3.1*   No results found for this basename: LIPASE, AMYLASE,  in the last 168 hours No results found for this basename: AMMONIA,  in the last 168 hours CBC:  Recent Labs Lab 01/23/13 1944 01/24/13 0620  WBC 9.2 9.0  NEUTROABS 7.2 5.1  HGB 13.4 12.0*  HCT 39.6 36.6*  MCV 90.6 91.7  PLT 266 256   Cardiac Enzymes: No results found for this basename: CKTOTAL, CKMB, CKMBINDEX, TROPONINI,  in the last 168 hours BNP: BNP (last 3 results) No results found for this basename: PROBNP,  in the last 8760 hours CBG:  Recent Labs Lab 01/24/13 0109 01/24/13 0755 01/24/13 1137  GLUCAP 89 86 97       Signed:  Chisa Kushner A  [facility] 01/24/2013, 4:44 PM

## 2013-01-24 NOTE — Evaluation (Signed)
Physical Therapy Evaluation Patient Details Name: Joseph Soto MRN: [id number] DOB: 1945/07/03 Today's Date: 01/24/2013 Time: 1129-1230 PT Time Calculation (min): 61 min  PT Assessment / Plan / Recommendation History of Present Illness  67 y.o. 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  He presents this episode with sudden onset dizziness, nausea, and vomiting.    Clinical Impression  Pt continues to have symptoms consistant with Meniere's Disease (increased endolynth- maybe triggered by increased sinus drainage due to wife is cleaning house due to mold outbreak).  Gaze stability is poor with transitional movements and movements of his head.  X1 exercises given with instructions on frequency.  Dix hallpike and other BPPV testing was negative.  Pt generally does not need assist with gait or stairs and would be safe to return home when medically stable.  PT is NOT recommending f/u at this time, but pt and wife given resources on how to get OP PT appointment if symptoms persist after modifications PT recommended and doing x 1 exercises x 2 weeks.  PT Assessment  Patient needs continued PT services    Follow Up Recommendations  No PT follow up;Supervision - Intermittent    Does the patient have the potential to tolerate intense rehabilitation     NA  Barriers to Discharge   None      Equipment Recommendations  None recommended by PT    Recommendations for Other Services   None  Frequency Min 3X/week    Precautions / Restrictions Precautions Precautions: Fall Precaution Comments: due to mildly unsteady on his feet.     Pertinent Vitals/Pain See vitals flow sheet. Orthostatic BP testing was (-).        Mobility  Bed Mobility Bed Mobility: Supine to Sit;Sitting - Scoot to Edge of Bed;Sit to Supine Supine to Sit: 6: Modified independent (Device/Increase time);HOB  flat Sitting - Scoot to Edge of Bed: 6: Modified independent (Device/Increase time) Sit to Supine: 6: Modified independent (Device/Increase time);HOB flat Details for Bed Mobility Assistance: pt needed some time between transitions due to increased symptoms with transitional movement.  Transfers Transfers: Sit to Stand;Stand to Sit Sit to Stand: 5: Supervision;From bed Stand to Sit: 5: Supervision;To bed Details for Transfer Assistance: supervision for safety due to feeling of being unbalanced.   Ambulation/Gait Ambulation/Gait Assistance: 4: Min guard;5: Supervision Ambulation Distance (Feet): 300 Feet Assistive device: None Ambulation/Gait Assistance Details: min guard assist when assessing higher level balance activites (horizontal head turns).  Gait Pattern: Step-through pattern (staggering) Gait velocity: decreased due to cautious of getting dizzy.   Stairs: Yes Stairs Assistance: 5: Supervision Stairs Assistance Details (indicate cue type and reason): supervision for safety Stair Management Technique: One rail Right;Alternating pattern;Forwards Number of Stairs: 9    Exercises Other Exercises Other Exercises: x1 exercises in sitting with vertical and horizontal head turns initiated.  HEP handout given and reviewed as well as resources to go to OP Neuro rehab if needed (if the symptoms do not resolve) for vestibular PT.     PT Diagnosis: Difficulty walking;Abnormality of gait;Other (comment) (vertigo)  PT Problem List: Decreased activity tolerance;Decreased balance;Decreased mobility;Pain PT Treatment Interventions: Gait training;Stair training;Therapeutic activities;Functional mobility training;Therapeutic exercise;Balance training;Neuromuscular re-education;Patient/family education     PT Goals(Current goals can be found in the care plan section) Acute Rehab PT Goals Patient Stated Goal: to go home, travel for work (drive) by the end of the week PT Goal Formulation:  With  patient/family Time For Goal Achievement: 02/07/13 Potential to Achieve Goals: Good  Visit Information  Last PT Received On: 01/24/13 Assistance Needed: +1 History of Present Illness: 67 y.o. 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  He presents this episode with sudden onset dizziness, nausea, and vomiting.         Prior Functioning  Home Living Family/patient expects to be discharged to:: Private residence Living Arrangements: Spouse/significant other (wife works and often travels for her job) Available Help at Discharge: Family;Available PRN/intermittently Type of Home: House Home Access: Stairs to enter Entrance Stairs-Number of Steps: 4 Entrance Stairs-Rails: None Home Layout: Two level;Bed/bath upstairs Alternate Level Stairs-Number of Steps: flight Alternate Level Stairs-Rails: Right Home Equipment: None Additional Comments: pt with history of vertigo attacks, has been treating a mold issue in his home, has history of sinus issues and surgery.   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  Communication Communication: No difficulties    Cognition  Cognition Arousal/Alertness: Awake/alert Behavior During Therapy: WFL for tasks assessed/performed Overall Cognitive Status: Within Functional Limits for tasks assessed    Extremity/Trunk Assessment Upper Extremity Assessment Upper Extremity Assessment: Overall WFL for tasks assessed Lower Extremity Assessment Lower Extremity Assessment: Overall WFL for tasks assessed Cervical / Trunk Assessment Cervical / Trunk Assessment: Normal   Balance Balance Balance Assessed: Yes Static Sitting Balance Static Sitting - Balance Support: No upper extremity supported;Feet supported Static Sitting - Level of Assistance: 7: Independent Static Standing  Balance Static Standing - Balance Support: No upper extremity supported Static Standing - Level of Assistance: 5: Stand by assistance Dynamic Standing Balance Dynamic Standing - Balance Support: No upper extremity supported Dynamic Standing - Level of Assistance: 4: Min assist Dynamic Standing - Comments: min guard assist with head turns during dynamic gait.  High Level Balance High Level Balance Activites: Turns;Head turns High Level Balance Comments: min guard assit  End of Session PT - End of Session Equipment Utilized During Treatment: Gait belt Activity Tolerance: Patient tolerated treatment well Patient left: in bed;with call bell/phone within reach;with family/visitor present Nurse Communication: Mobility status;Other (comment) (no PT f/u needed after d/c)  GP Functional Assessment Tool Used: assist level Functional Limitation: Mobility: Walking and moving around Mobility: Walking and Moving Around Current Status [phone]): At least 1 percent but less than 20 percent impaired, limited or restricted Mobility: Walking and Moving Around Goal Status [phone]): At least 1 percent but less than 20 percent impaired, limited or restricted   Pauletta Pickney B. Akane Tessier, PT, DPT [phone]   01/24/2013, 1:45 PM

## 2013-01-24 NOTE — Progress Notes (Signed)
[facility] PROGRESS NOTE  Joseph Soto WUJ:[id number] DOB: 1945/08/11 DOA: 01/23/2013 PCP: Jarome Matin  HPI/Subjective: Complaining about fullness in his face and around his ears. CT scan showed good aeration in the paranasal and maxillary sinuses  Assessment/Plan: Principal Problem:   Positional vertigo Active Problems:   CAD (coronary artery disease)   Hyperlipidemia   Vertigo -Likely positional vertigo, PT/OT to evaluate and treat. -Patient has CT scan showed no acute findings. -Discontinued MRI because this is likely peripheral.  Allergic rhinitis -Long history of allergy, he is seeing an immunologist. -On Singulair and Nasacort at home. -Added Afrin as needed, needs followup with his immunologist. -If symptoms not improving can probably placed on steroids taper.  CAD -No acute symptoms. Continue home medications.  Code Status: Full code Family Communication: Plan discussed with the patient. Disposition Plan: Remains inpatient   Consultants:  None  Procedures:   none  Antibiotics:  None   Objective: Filed Vitals:   01/24/13 1056  BP: 112/69  Pulse: 69  Temp: 98.4 F (36.9 C)  Resp: 18    Intake/Output Summary (Last 24 hours) at 01/24/13 1119 Last data filed at 01/24/13 0758  Gross per 24 hour  Intake      0 ml  Output   1750 ml  Net  -1750 ml   Filed Weights   01/24/13 0108  Weight: 87.7 kg (193 lb 5.5 oz)    Exam: General: Alert and awake, oriented x3, not in any acute distress. HEENT: anicteric sclera, pupils reactive to light and accommodation, EOMI CVS: S1-S2 clear, no murmur rubs or gallops Chest: clear to auscultation bilaterally, no wheezing, rales or rhonchi Abdomen: soft nontender, nondistended, normal bowel sounds, no organomegaly Extremities: no cyanosis, clubbing or edema noted bilaterally Neuro: Cranial nerves II-XII intact, no focal neurological deficits  Data Reviewed: Basic Metabolic Panel:  Recent  Labs Lab 01/23/13 1325 01/24/13 0620  NA 139 139  K 4.5 3.8  CL 105 107  CO2 23 21  GLUCOSE 113* 81  BUN 20 14  CREATININE 0.91 0.96  CALCIUM 8.9 8.2*   Liver Function Tests:  Recent Labs Lab 01/24/13 0620  AST 23  ALT 19  ALKPHOS 44  BILITOT 0.6  PROT 5.8*  ALBUMIN 3.1*   No results found for this basename: LIPASE, AMYLASE,  in the last 168 hours No results found for this basename: AMMONIA,  in the last 168 hours CBC:  Recent Labs Lab 01/23/13 1944 01/24/13 0620  WBC 9.2 9.0  NEUTROABS 7.2 5.1  HGB 13.4 12.0*  HCT 39.6 36.6*  MCV 90.6 91.7  PLT 266 256   Cardiac Enzymes: No results found for this basename: CKTOTAL, CKMB, CKMBINDEX, TROPONINI,  in the last 168 hours BNP (last 3 results) No results found for this basename: PROBNP,  in the last 8760 hours CBG:  Recent Labs Lab 01/24/13 0109 01/24/13 0755  GLUCAP 89 86    Micro No results found for this or any previous visit (from the past 240 hour(s)).   Studies: Ct Abdomen Pelvis Wo Contrast  01/23/2013   CLINICAL DATA:  Nausea, vomiting and dizziness with is not responding to medication. History of non-Hodgkin's lymphoma and hyperlipidemia.  EXAM: CT ABDOMEN AND PELVIS WITHOUT CONTRAST  TECHNIQUE: Multidetector CT imaging of the abdomen and pelvis was performed following the standard protocol without intravenous contrast.  COMPARISON:  11/13/2011 and 08/14/2005 CT abdomen and pelvis. 05/12/2012 chest CT.  FINDINGS: Basilar parenchymal changes most suggestive of atelectasis combined with scarring.  Coronary artery calcifications.  Heart size within normal limits.  Atherosclerotic type changes with ectasia of the abdominal aorta measuring up to 2.4 cm. Common iliac artery fusiform dilation measuring up to 1.8 cm on the left and on the right. Moderate narrowing of the distal common iliac arteries greater on the left. Findings have progressed slightly since prior exam.  No evidence of adenopathy or splenomegaly.  Borderline right external iliac lymph node with short axis dimension of 9 mm unchanged.  Taking into account limitation by non contrast imaging, no worrisome hepatic, splenic, pancreatic, adrenal or renal lesion. Mild haziness surrounding kidneys unchanged.  Post cholecystectomy. Prominence of the common bile duct may be related to post cholecystectomy state and only minimally more prominent than on the prior exam. No distal calcified common bile duct stone is noted.  Stomach is under distended without gross abnormality noted. Scattered colonic diverticula. No extra luminal bowel inflammatory process, free fluid or free air. Portions of the colon are under distended and evaluation limited.  The appendix remains prominent in size containing low-density material but without extra luminal inflammation. 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  Non contrast filled views of the urinary bladder unremarkable. Prostate gland does not appear enlarged.  No bony destructive lesion.  IMPRESSION:.: IMPRESSION:. No evidence of adenopathy or splenomegaly. Borderline right external iliac lymph node with short axis dimension of 9 mm unchanged.  Post cholecystectomy. Prominence of the common bile duct may be related to post cholecystectomy state and only minimally more prominent than on the prior exam. No calcified calcified common bile duct stone is noted.  Stomach is under distended without gross abnormality noted. Scattered colonic diverticula. No extra luminal bowel inflammatory process, free fluid or free air. Portions of the colon are under distended and evaluation limited.  The appendix remains prominent in size containing low-density material but without extra luminal inflammation. 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  Atherosclerotic type changes coronary arteries, aorta are and common iliac arteries as detailed above.   Electronically  Signed   By: Bridgett Larsson M.D.   On: 01/23/2013 17:54   Ct Head Wo Contrast  01/23/2013   CLINICAL DATA:  Dizziness and nausea. History of non-Hodgkin's lymphoma.  EXAM: CT HEAD WITHOUT CONTRAST  TECHNIQUE: Contiguous axial images were obtained from the base of the skull through the vertex without intravenous contrast.  COMPARISON:  CT of head Jul 02, 2006.  FINDINGS: The ventricles and sulci are normal for age. No intraparenchymal hemorrhage, mass effect nor midline shift. Patchy supratentorial white matter hypodensities are less than expected for patient's age and though non-specific suggest sequelae of chronic small vessel ischemic disease. No acute large vascular territory infarcts. Patchy hypodensity within the pons favors beam hardening artifact from the adjacent calvarium.  No abnormal extra-axial fluid collections. Basal cisterns are patent. Moderate calcific atherosclerosis of the carotid siphons.  No skull fracture. Visualized paranasal sinuses and mastoid aircells are well-aerated. The included ocular globes and orbital contents are non-suspicious. Status post left ocular lens implant. 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  IMPRESSION: No acute intracranial process.  Involutional changes. Mild white matter changes suggest chronic small vessel ischemic disease.   Electronically Signed   By: Awilda Metro   On: 01/23/2013 21:56    Scheduled Meds: . atorvastatin  80 mg Oral q1800  . enoxaparin (LOVENOX) injection  40 mg Subcutaneous Q24H  . ipratropium  2 spray Nasal TID  . metoprolol tartrate  12.5 mg Oral BID  . montelukast  10 mg Oral QHS  . oxymetazoline  1 spray Each Nare BID  . ranolazine  500 mg Oral Daily   Continuous Infusions:      Time spent: 35 minutes    [facility] A  [facility] Pager [phone] If 7PM-7AM, please contact night-coverage at [email or web address], password [facility] 01/24/2013, 11:19 AM  LOS: 1 day

## 2013-01-24 NOTE — ED Provider Notes (Signed)
I saw and evaluated the patient, reviewed the resident's note and I agree with the findings and plan.  EKG Interpretation   None       Pt c/o dizziness c/w prior episode vertigo w acutely bending over/lowering head and abruptly standing up. Transient unidir nystagmus noted. No change in speech or hearing. No numbness/weakness. No problems w balance or coordination.   Also c/o subsequent/later, acute onset left flank pain. Non radiating. Waxing/waning. Pain rx. ua and ct added.    Suzi Roots, MD 01/24/13 [phone]

## 2013-01-24 NOTE — Evaluation (Signed)
Occupational Therapy Evaluation Patient Details Name: Joseph Soto MRN: [id number] DOB: 11-30-1945 Today's Date: 01/24/2013 Time: 1351-1405 OT Time Calculation (min): 14 min  OT Assessment / Plan / Recommendation History of present illness 67 y.o. male admitted to [facility] on 01/23/13 with medical history of coronary artery disease, non-Hodgkin's lymphoma, history of vertigo secondary to excess endolymph.  He presents this episode with sudden onset dizziness, nausea, and vomiting.     Clinical Impression   Pt at min guard - sup level with ADLs and ADL mobility due to safety risks from vertigo/dizziness. All education completed and no further acute OT services needed. Pt to continue with acute PT services for functional mobility balance/safety. OT will sing off    OT Assessment  Patient does not need any further OT services    Follow Up Recommendations  No OT follow up    Barriers to Discharge  none    Equipment Recommendations  None recommended by OT    Recommendations for Other Services    Frequency       Precautions / Restrictions Precautions Precautions: Fall Precaution Comments: due to mildly unsteady on his feet.   Restrictions Weight Bearing Restrictions: No   Pertinent Vitals/Pain No c/o pain    ADL  Grooming: Performed;Wash/dry hands;Wash/dry face;Supervision/safety;Min guard Upper Body Bathing: Simulated;Supervision/safety Lower Body Bathing: Simulated;Supervision/safety;Min guard Upper Body Dressing: Performed;Supervision/safety Lower Body Dressing: Performed;Supervision/safety;Min guard Toilet Transfer: Research scientist (life sciences) Method: Sit to Barista: Regular height toilet Toileting - Clothing Manipulation and Hygiene: Performed;Supervision/safety Where Assessed - Engineer, mining and Hygiene: Standing Tub/Shower Transfer: Location manager: Walk in shower     OT Diagnosis:    OT Problem List:   OT Treatment Interventions:     OT Goals(Current goals can be found in the care plan section) Acute Rehab OT Goals Patient Stated Goal: to go home, travel for work (drive) by the end of the week  Visit Information  Last OT Received On: 01/24/13 Assistance Needed: +1 History of Present Illness: 67 y.o. male admitted to [facility] on 01/23/13 with medical history of coronary artery disease, non-Hodgkin's lymphoma, history of vertigo secondary to excess endolymph.  He presents this episode with sudden onset dizziness, nausea, and vomiting.         Prior Functioning     Home Living Family/patient expects to be discharged to:: Private residence Living Arrangements: Spouse/significant other Available Help at Discharge: Family;Available PRN/intermittently Type of Home: House Home Access: Stairs to enter Entrance Stairs-Number of Steps: 4 Entrance Stairs-Rails: None Home Layout: Two level;Bed/bath upstairs Alternate Level Stairs-Number of Steps: flight Alternate Level Stairs-Rails: Right Home Equipment: None Additional Comments: pt with history of vertigo attacks, has been treating a mold issue in his home, has history of sinus issues and surgery.   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  Communication Communication: No difficulties Dominant Hand: Left         Vision/Perception Vision - History Baseline Vision: Wears glasses only for reading Patient Visual Report: No change from baseline Perception Perception: Within Functional Limits   Cognition  Cognition Arousal/Alertness: Awake/alert Behavior During Therapy: WFL for tasks assessed/performed Overall Cognitive Status: Within Functional Limits for tasks assessed    Extremity/Trunk Assessment Upper Extremity Assessment Upper Extremity Assessment: Overall WFL for tasks assessed Lower Extremity Assessment Lower  Extremity Assessment: Defer to PT evaluation Cervical / Trunk Assessment Cervical / Trunk Assessment: Normal     Mobility Bed Mobility Bed  Mobility: Supine to Sit;Sitting - Scoot to Edge of Bed;Sit to Supine Supine to Sit: 6: Modified independent (Device/Increase time);HOB flat Sitting - Scoot to Edge of Bed: 6: Modified independent (Device/Increase time) Sit to Supine: 6: Modified independent (Device/Increase time);HOB flat Details for Bed Mobility Assistance: pt needed some time between transitions due to increased symptoms with transitional movement.  Transfers Transfers: Stand to Sit;Sit to Stand Sit to Stand: 5: Supervision;From bed;From toilet Stand to Sit: 5: Supervision;To bed;To toilet Details for Transfer Assistance: supervision for safety due to feeling of being unbalanced.          Balance Balance Balance Assessed: Yes Static Sitting Balance Static Sitting - Balance Support: No upper extremity supported;Feet supported Static Sitting - Level of Assistance: 7: Independent Static Standing Balance Static Standing - Balance Support: No upper extremity supported Static Standing - Level of Assistance: 5: Stand by assistance Dynamic Standing Balance Dynamic Standing - Balance Support: No upper extremity supported Dynamic Standing - Level of Assistance: Other (comment) (min guard A)    End of Session OT - End of Session Activity Tolerance: Patient tolerated treatment well Patient left: in bed;with call bell/phone within reach;with family/visitor present  GO Functional Limitation: Self care Self Care Current Status (Z6109): 0 percent impaired, limited or restricted Self Care Goal Status (U0454): 0 percent impaired, limited or restricted Self Care Discharge Status (U9811): 0 percent impaired, limited or restricted   Galen Manila 01/24/2013, 2:43 PM

## 2013-01-24 NOTE — Progress Notes (Signed)
UR completed 

## 2013-01-24 NOTE — ED Notes (Signed)
Spoke with Dr. Allena Katz, states he does not need NIHS done for this pt.

## 2013-01-29 ENCOUNTER — Other Ambulatory Visit: Payer: Self-pay | Admitting: Physician Assistant

## 2013-05-07 ENCOUNTER — Other Ambulatory Visit: Payer: Self-pay | Admitting: Cardiovascular Disease

## 2013-05-25 ENCOUNTER — Telehealth: Payer: Self-pay | Admitting: *Deleted

## 2013-05-25 NOTE — Telephone Encounter (Signed)
PA to [facility] for patients Joseph Soto

## 2013-05-31 NOTE — Progress Notes (Signed)
Patient ID: Joseph Soto, male   DOB: 1945-12-12, 68 y.o.   MRN: [id number] Patient was approved for ranexa from 02/09/13-02/08/14. Id # [id number] PATIENT AWARE

## 2013-06-01 ENCOUNTER — Other Ambulatory Visit: Payer: Self-pay | Admitting: Cardiovascular Disease

## 2013-07-25 ENCOUNTER — Other Ambulatory Visit: Payer: Self-pay | Admitting: Cardiovascular Disease

## 2013-08-04 ENCOUNTER — Other Ambulatory Visit: Payer: Self-pay | Admitting: Cardiovascular Disease

## 2013-10-03 ENCOUNTER — Telehealth: Payer: Self-pay

## 2013-10-03 NOTE — Telephone Encounter (Signed)
Agree. Thanks

## 2013-10-03 NOTE — Telephone Encounter (Signed)
Patient has a h/o CAD and LDL ~ 120 mg/dL.  He is on Crestor 20 mg qd (1/2 of 40 mg) currently.  He tells me he has significant muscle weakness and lack of energy and he feels it is coming from statin.  He failed simvastatin in the past due to fatigue and muscle weakness as well.  Patient would like to hold statin for a short period of time to see if he feels better.  I feel this is reasonable, and he understands that in 2-3 weeks if there is no improvement in his symptoms, that it is unlikely due to Crestor and that he will need to go back on it.  If there is improvement off statin may need to consider a different, better tolerated statin (i.e. Livalo or pravastatin), or lower dose of Crestor (i.e. crestor 5 mg qd).  Patient agrees to call me back in 2-3 weeks with status update so we can determine what to do with his lipid lowering therapy.  To Dr. Angelena Form as FYI only.

## 2013-10-30 ENCOUNTER — Telehealth: Payer: Self-pay | Admitting: Cardiovascular Disease

## 2013-10-30 ENCOUNTER — Encounter: Payer: Self-pay | Admitting: Physician Assistant

## 2013-10-30 ENCOUNTER — Ambulatory Visit (INDEPENDENT_AMBULATORY_CARE_PROVIDER_SITE_OTHER): Payer: Medicare HMO | Admitting: Physician Assistant

## 2013-10-30 VITALS — BP 120/82 | HR 84 | Ht 71.0 in | Wt 195.4 lb

## 2013-10-30 DIAGNOSIS — E785 Hyperlipidemia, unspecified: Secondary | ICD-10-CM

## 2013-10-30 DIAGNOSIS — R0600 Dyspnea, unspecified: Secondary | ICD-10-CM

## 2013-10-30 DIAGNOSIS — R0602 Shortness of breath: Secondary | ICD-10-CM

## 2013-10-30 DIAGNOSIS — R0609 Other forms of dyspnea: Secondary | ICD-10-CM | POA: Insufficient documentation

## 2013-10-30 DIAGNOSIS — I25810 Atherosclerosis of coronary artery bypass graft(s) without angina pectoris: Secondary | ICD-10-CM

## 2013-10-30 DIAGNOSIS — R0989 Other specified symptoms and signs involving the circulatory and respiratory systems: Secondary | ICD-10-CM

## 2013-10-30 NOTE — Telephone Encounter (Signed)
Pt called c/o SOB and lethargy since Friday afternoon. Pt also c/o "swollen head." Pt st that he is going to try to see his allergist today for head issues. Pt st he cannot walk up and down stairs without getting SOB. Pt having no other symptoms.  Spoke to Dr. Angelena Form who said to put Joseph Soto on PA schedule today. Patient informed of appointment and confirmed he will be here at scheduled time.

## 2013-10-30 NOTE — Patient Instructions (Signed)
Your physician recommends that you continue on your current medications as directed. Please refer to the Current Medication list given to you today.   Your physician has requested that you have an echocardiogram. Echocardiography is a painless test that uses sound waves to create images of your heart. It provides your doctor with information about the size and shape of your heart and how well your heart's chambers and valves are working. This procedure takes approximately one hour. There are no restrictions for this procedure.   Your physician recommends that you schedule a follow-up appointment in: [place]

## 2013-10-30 NOTE — Assessment & Plan Note (Addendum)
Patient complains a 2 week history of dyspnea on exertion associated with weakness and fatigue. It is different from his angina. His stress test would be abnormal based on his known CAD. I will order a 2-D echo to reassess LV function. There is no evidence of heart failure on exam today. EKG is normal. He will followup with Dr.McAlhany in 1 month. Obtain blood work from Dr. Sharlett Iles.

## 2013-10-30 NOTE — Assessment & Plan Note (Signed)
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 He had an abnormal stress test in 2010 followed by cath. I do not  feel like a stress test would help at this time.

## 2013-10-30 NOTE — Telephone Encounter (Signed)
New message          C/o sob / pt states it is not an emergency but would like to be seen today

## 2013-10-30 NOTE — Progress Notes (Signed)
HPI: This is a 68 year old patient of Dr. Angelena Form who has a history of CAD status post CABG in 2007. Last cath in 2010 showed occluded SVG to the diagonal, occluded SVG to the PDA and patent LIMA to the LAD. There was diffuse disease in the distal vessel felt best to be managed medically. Last stress test in 2010 prior to cath showed inferior ischemia. He also has non-Hodgkin's lymphoma, hyperlipidemia followed in our lipid clinic. 2-D echo in 2012 showed normal LV function EF 50% with grade 1 diastolic dysfunction.  Patient comes in today with 2 week history of progressive dyspnea on exertion, weakness, dizziness and over all feeling poorly. He also saw his primary care on Friday for a lot of head congestion, seasonal allergies, and possible gluten allergy. He had blood work done. He denies any chest pain, palpitations, or presyncope. He just feels like something is wrong in his chest. He says it's not his angina. He was walking 2 miles daily and has not been able to do it for 2 weeks. He denies any edema.  Allergies  Allergen Reactions  . Niaspan [Niacin Er]     Stomach intolerance     Current Outpatient Prescriptions  Medication Sig Dispense Refill  . cholecalciferol (VITAMIN D) 400 UNITS TABS Take 400 Units by mouth daily.      Marland Kitchen co-enzyme Q-10 30 MG capsule Take 100 mg by mouth daily.       . CRESTOR 40 MG tablet TAKE 1 TABLET (40 MG TOTAL) BY MOUTH DAILY.  30 tablet  3  . diazepam (VALIUM) 5 MG tablet Take 1 tablet (5 mg total) by mouth every 12 (twelve) hours as needed for anxiety.  30 tablet  0  . fish oil-omega-3 fatty acids 1000 MG capsule Take 2 g by mouth daily.        Marland Kitchen HYDROcodone-acetaminophen (NORCO) 5-325 MG per tablet Take 2 tablets by mouth every 4 (four) hours as needed.  10 tablet  0  . ipratropium (ATROVENT) 0.06 % nasal spray Place 2 sprays into the nose 3 (three) times daily.      . L-CITRULLINE PO Take 1 tablet by mouth daily.      . meclizine (ANTIVERT)  25 MG tablet Take 1 tablet (25 mg total) by mouth 3 (three) times daily as needed for dizziness.  12 tablet  0  . metoprolol tartrate (LOPRESSOR) 25 MG tablet Take 0.5 tablets (12.5 mg total) by mouth 2 (two) times daily.  90 tablet  3  . montelukast (SINGULAIR) 10 MG tablet Take 10 mg by mouth at bedtime.      . Multiple Vitamin (MULTIVITAMIN) capsule Take 1 capsule by mouth daily.        . nitroGLYCERIN (NITROSTAT) 0.4 MG SL tablet Place 1 tablet (0.4 mg total) under the tongue every 5 (five) minutes as needed for chest pain.  25 tablet  6  . ondansetron (ZOFRAN ODT) 4 MG disintegrating tablet 4mg  ODT q4 hours prn nausea/vomit  4 tablet  0  . ondansetron (ZOFRAN) 4 MG tablet Take 4 mg by mouth every 8 (eight) hours as needed for nausea or vomiting.      Marland Kitchen RANEXA 500 MG 12 hr tablet TAKE 1 TABLET BY MOUTH EVERY DAY  30 tablet  3  . triamcinolone (NASACORT) 55 MCG/ACT AERO nasal inhaler Place 1 spray into the nose daily.  1 Inhaler  12   No current facility-administered medications for this visit.  Past Medical History  Diagnosis Date  . HLD (hyperlipidemia)   . Non Hodgkin's lymphoma   . Dizziness     1 episode   . CAD (coronary artery disease)     Last cath 2010 per Dr. Doreatha Lew with diffuse multivessel disease, small caliber vessels not felt to be suitable for PCI    Past Surgical History  Procedure Laterality Date  . Coronary artery bypass graft  5/07    x5. LV dysfunction.   . Cholecystectomy    . Tonsillectomy    . Baker cyst removal      Family History  Problem Relation Age of Onset  . Heart disease Father   . Heart attack Father     History   Social History  . Marital Status: Married    Spouse Name: N/A    Number of Children: N/A  . Years of Education: N/A   Occupational History  . Camera operator    Social History Main Topics  . Smoking status: Former Research scientist (life sciences)  . Smokeless tobacco: Not on file     Comment: quit in 2007   . Alcohol Use: No  . Drug Use:  No  . Sexual Activity: Not on file   Other Topics Concern  . Not on file   Social History Narrative   Married, 2 children.   Runs an executive recruiting company.     ROS: See history of present illness otherwise negative  BP 120/82  Pulse 84  Ht 5\' 11"  (1.803 m)  Wt 195 lb 6.4 oz (88.633 kg)  BMI 27.26 kg/m2  PHYSICAL EXAM: Well-nournished, in no acute distress. Neck: No JVD, HJR, Bruit, or thyroid enlargement  Lungs: No tachypnea, clear without wheezing, rales, or rhonchi  Cardiovascular: RRR, PMI not displaced, heart sounds normal, no murmurs, gallops, bruit, thrill, or heave.  Abdomen: BS normal. Soft without organomegaly, masses, lesions or tenderness.  Extremities: without cyanosis, clubbing or edema. Good distal pulses bilateral  SKin: Warm, no lesions or rashes   Musculoskeletal: No deformities  Neuro: no focal signs   Wt Readings from Last 3 Encounters:  01/24/13 193 lb 5.5 oz (87.7 kg)  06/09/12 190 lb 1.9 oz (86.238 kg)  12/01/11 190 lb (86.183 kg)     EKG: Normal sinus rhythm, normal EKG  2-D echo 09/01/10 Study Conclusions Left ventricle: The cavity size was normal. Wall thickness was normal. The estimated ejection fraction was 50%. Wall motion was normal; there were no regional wall motion abnormalities. Doppler parameters are consistent with abnormal left ventricular relaxation (grade 1 diastolic dysfunction).

## 2013-10-31 ENCOUNTER — Ambulatory Visit (HOSPITAL_COMMUNITY): Payer: Medicare HMO | Attending: Physician Assistant | Admitting: Cardiology

## 2013-10-31 ENCOUNTER — Other Ambulatory Visit: Payer: Self-pay

## 2013-10-31 DIAGNOSIS — Z87891 Personal history of nicotine dependence: Secondary | ICD-10-CM | POA: Diagnosis not present

## 2013-10-31 DIAGNOSIS — E785 Hyperlipidemia, unspecified: Secondary | ICD-10-CM | POA: Insufficient documentation

## 2013-10-31 DIAGNOSIS — I059 Rheumatic mitral valve disease, unspecified: Secondary | ICD-10-CM | POA: Insufficient documentation

## 2013-10-31 DIAGNOSIS — R0602 Shortness of breath: Secondary | ICD-10-CM

## 2013-10-31 DIAGNOSIS — I079 Rheumatic tricuspid valve disease, unspecified: Secondary | ICD-10-CM | POA: Insufficient documentation

## 2013-10-31 DIAGNOSIS — I2510 Atherosclerotic heart disease of native coronary artery without angina pectoris: Secondary | ICD-10-CM | POA: Insufficient documentation

## 2013-10-31 MED ORDER — ROSUVASTATIN CALCIUM 40 MG PO TABS: ORAL_TABLET | ORAL | Status: DC

## 2013-10-31 NOTE — Progress Notes (Signed)
Echo performed. 

## 2013-11-06 ENCOUNTER — Telehealth: Payer: Self-pay | Admitting: *Deleted

## 2013-11-06 MED ORDER — METOPROLOL TARTRATE 25 MG PO TABS: 12.5000 mg | ORAL_TABLET | Freq: Two times a day (BID) | ORAL | Status: DC

## 2013-11-06 NOTE — Telephone Encounter (Signed)
Received request for refill of metoprolol tartrate. I confirmed with pt that he is taking metoprolol tartrate 12. 5 mg by mouth twice daily. Will send 30 day supply with 11 refills to [facility] on [address] [facility]

## 2013-11-10 ENCOUNTER — Institutional Professional Consult (permissible substitution): Payer: Self-pay | Admitting: Neurology

## 2013-11-20 ENCOUNTER — Encounter: Payer: Self-pay | Admitting: Neurology

## 2013-11-20 ENCOUNTER — Ambulatory Visit (INDEPENDENT_AMBULATORY_CARE_PROVIDER_SITE_OTHER): Payer: Medicare HMO | Admitting: Neurology

## 2013-11-20 VITALS — BP 121/71 | HR 64 | Resp 16 | Ht 71.75 in | Wt 185.0 lb

## 2013-11-20 DIAGNOSIS — R5382 Chronic fatigue, unspecified: Secondary | ICD-10-CM

## 2013-11-20 DIAGNOSIS — C8589 Other specified types of non-Hodgkin lymphoma, extranodal and solid organ sites: Secondary | ICD-10-CM

## 2013-11-20 DIAGNOSIS — J45909 Unspecified asthma, uncomplicated: Secondary | ICD-10-CM

## 2013-11-20 DIAGNOSIS — C8299 Follicular lymphoma, unspecified, extranodal and solid organ sites: Secondary | ICD-10-CM

## 2013-11-20 NOTE — Patient Instructions (Signed)

## 2013-11-20 NOTE — Progress Notes (Signed)
SLEEP MEDICINE CLINIC   Provider:  Larey Seat, M D  Referring Provider: [name], MD Primary Care Physician:  [name]  Chief Complaint  Patient presents with  . Chronic Insomnia    NP, Paper referrral, Rm 11    HPI:  Joseph Soto is a 68 y.o. caucasain, married, male, who is seen here as a referral from Dr. Philip Aspen for a sleep consultation.  Dr. Bevelyn Buckles referred his patient , who is semi- retired, The patient has a history of insomnia and but a sleep study would be preferred way of working the patient up for this problem. 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  He has allergic rhinitis, only developed over the last 6 years, causing severe sinusitis.  The triggers are grass, ragwheat and dust he has hyperlipidemia, hypertension and had chronic left gluteal pain. Last year he had sinusplasty surgery, successful. In the meantime he found mold under his house, and believed this to be a trigger.   The patient has chronic buttock pain which has at times influenced asleep. He usually goes to bed between 10 and 11 PM he will read for little while but usually has not trouble initiating sleep. In the last 1 or 2 months he has not woken up frequently at night but previously he had trouble with that. He has one nocturia on average. His wife, a light sleeper , reported that he snores on and off but not nightly/ not daily. He used ton get up at 5.30 , now semi retired he sleeps until 7 AM , he feels he could go to sleep again.  He uses no alarm. His wife has not noted any sleep walking, sleep talking, kicking or other behaviors at night. The patient will now sleep an average of 7-8 hours, he used not to be able to sit for long he was full-time employed.  The patient reports that he does not feel restored or refreshed in the morning however. He has regularly a dry mouth in the morning, no morning headaches. He has not felt " bright eyed and bushy tailed " for the last 2 years.    The patient is a for a former smoker, who quit in 2007.  Review of Systems: Out of a complete 14 system review, the patient complains of only the following symptoms, and all other reviewed systems are negative. Vertigo, sinuitis, palpitation, hoarsenes,  Epworth score 3, Fatigue severity score- not endorsed  ,Geriatric  depression score 3.   History   Social History  . Marital Status: Married    Spouse Name: [name]    Number of Children: 2  . Years of Education: college   Occupational History  . Camera operator    Social History Main Topics  . Smoking status: Former Research scientist (life sciences)  . Smokeless tobacco: Not on file     Comment: quit in 2007   . Alcohol Use: No  . Drug Use: No  . Sexual Activity: Not on file   Other Topics Concern  . Not on file   Social History Narrative   Married, 2 children.   Runs an executive recruiting company.     Family History  Problem Relation Age of Onset  . Heart disease Father   . Heart attack Father  Past Medical History  Diagnosis Date  . HLD (hyperlipidemia)   . Non Hodgkin's lymphoma   . Dizziness     1 episode   . CAD (coronary artery disease)     Last cath 2010 per Dr. Doreatha Lew with diffuse multivessel disease, small caliber vessels not felt to be suitable for PCI    Past Surgical History  Procedure Laterality Date  . Coronary artery bypass graft  5/07    x5. LV dysfunction.   . Cholecystectomy    . Tonsillectomy    . Baker cyst removal      Current Outpatient Prescriptions  Medication Sig Dispense Refill  . cholecalciferol (VITAMIN D) 400 UNITS TABS Take 400 Units by mouth daily.      Marland Kitchen co-enzyme Q-10 30 MG capsule Take 100 mg by mouth daily.       . fish oil-omega-3 fatty acids 1000 MG  capsule Take 2 g by mouth daily.        Marland Kitchen ipratropium (ATROVENT) 0.06 % nasal spray Place 2 sprays into the nose 3 (three) times daily.      . meclizine (ANTIVERT) 25 MG tablet Take 1 tablet (25 mg total) by mouth 3 (three) times daily as needed for dizziness.  12 tablet  0  . metoprolol tartrate (LOPRESSOR) 25 MG tablet Take 0.5 tablets (12.5 mg total) by mouth 2 (two) times daily.  30 tablet  11  . montelukast (SINGULAIR) 10 MG tablet Take 10 mg by mouth at bedtime.      . Multiple Vitamin (MULTIVITAMIN) capsule Take 1 capsule by mouth daily.        . nitroGLYCERIN (NITROSTAT) 0.4 MG SL tablet Place 1 tablet (0.4 mg total) under the tongue every 5 (five) minutes as needed for chest pain.  25 tablet  6  . ondansetron (ZOFRAN ODT) 4 MG disintegrating tablet 4mg  ODT q4 hours prn nausea/vomit  4 tablet  0  . ondansetron (ZOFRAN) 4 MG tablet Take 4 mg by mouth every 8 (eight) hours as needed for nausea or vomiting.      Marland Kitchen RANEXA 500 MG 12 hr tablet TAKE 1 TABLET BY MOUTH EVERY DAY  30 tablet  3  . rosuvastatin (CRESTOR) 40 MG tablet TAKE 1 TABLET (40 MG TOTAL) BY MOUTH DAILY.  90 tablet  0  . triamcinolone (NASACORT) 55 MCG/ACT AERO nasal inhaler Place 1 spray into the nose daily.  1 Inhaler  12   No current facility-administered medications for this visit.    Allergies as of 11/20/2013 - Review Complete 11/20/2013  Allergen Reaction Noted  . Niaspan [niacin er]  08/21/2010    Vitals: BP 121/71  Pulse 64  Resp 16  Ht 5' 11.75" (1.822 m)  Wt 185 lb (83.915 kg)  BMI 25.28 kg/m2 Last Weight:  Wt Readings from Last 1 Encounters:  11/20/13 185 lb (83.915 kg)       Last Height:   Ht Readings from Last 1 Encounters:  11/20/13 5' 11.75" (1.822 m)    Physical exam:  General: The patient is awake, alert and appears not in acute distress. The patient is well groomed. Head: Normocephalic, atraumatic. Neck is supple. Mallampati 1,  neck circumference: 15. 25  . Nasal airflow semi restricted-   TMJ is not evident. Retrognathia is not seen.  Cardiovascular:  Regular rate and rhythm, without  murmurs or carotid bruit, and without distended neck veins. Respiratory: Lungs with ronci.  Skin:  Without evidence of edema, or rash Trunk:  Patient  has normal posture.  Neurologic exam : The patient is awake and alert, oriented to place and time.   Memory subjective  described as intact.  There is a normal attention span & concentration ability.  Speech is fluent without dysarthria, dysphonia or aphasia. Mood and affect are appropriate.  Cranial nerves: Pupils are equal and briskly reactive to light. Funduscopic exam without  evidence of pallor or edema.  Extraocular movements  in vertical and horizontal planes intact and without nystagmus. Visual fields by finger perimetry are intact. Hearing to finger rub intact. Facial sensation intact to fine touch. Facial motor strength is symmetric and tongue and uvula move midline.  Motor exam: Normal tone, muscle bulk and symmetric, strength in all extremities.  Sensory:  Fine touch, pinprick and vibration were tested in all extremities.  Proprioception is tested in the upper extremities only. This was normal.  Coordination: Rapid alternating movements in the fingers/hands is normal.  Finger-to-nose maneuver  normal without evidence of ataxia, dysmetria or tremor.  Gait and station: Patient walks without assistive device and is able unassisted to climb up to the exam table.  Strength within normal limits. Stance is stable and normal. Tandem gait is unfragmented. Romberg testing is negative.  Deep tendon reflexes: in the upper and lower extremities are symmetric and intact.  Babinski maneuver response is downgoing.   Assessment:  After physical and neurologic examination, review of laboratory studies, imaging, neurophysiology testing and pre-existing records, assessment is:   1)  Unlikely to have OSA, but his higher degree of fatigue and  history of smoking, and sinusitis make me concerned about a pulmonary condition with hypoventilation, retention of CO2.  The patient was advised of the nature of the diagnosed sleep disorder , the treatment options and risks for general a health and wellness arising from not treating the condition. Visit duration was 45 minutes.   Plan:  Treatment plan and additional workup :  SPLIT at [facility] criteria , supplement. 4% Spo2 and AHI at 15.  Co2 , please.      Asencion Partridge Donovan Persley MD  11/20/2013   Cc; Dr. Dagmar Hait , allergist.

## 2013-12-06 ENCOUNTER — Other Ambulatory Visit: Payer: Self-pay | Admitting: Cardiovascular Disease

## 2013-12-13 ENCOUNTER — Ambulatory Visit: Payer: Medicare HMO | Admitting: Cardiovascular Disease

## 2013-12-31 ENCOUNTER — Ambulatory Visit (INDEPENDENT_AMBULATORY_CARE_PROVIDER_SITE_OTHER): Payer: Medicare HMO | Admitting: Neurology

## 2013-12-31 VITALS — BP 136/73

## 2013-12-31 DIAGNOSIS — G4719 Other hypersomnia: Secondary | ICD-10-CM

## 2013-12-31 DIAGNOSIS — C8299 Follicular lymphoma, unspecified, extranodal and solid organ sites: Secondary | ICD-10-CM

## 2013-12-31 DIAGNOSIS — R5382 Chronic fatigue, unspecified: Secondary | ICD-10-CM

## 2013-12-31 DIAGNOSIS — G4710 Hypersomnia, unspecified: Secondary | ICD-10-CM

## 2013-12-31 DIAGNOSIS — J45909 Unspecified asthma, uncomplicated: Secondary | ICD-10-CM

## 2014-01-01 NOTE — Sleep Study (Signed)
Please see the scanned sleep study interpretation located in the Procedure tab within the Chart Review section. 

## 2014-01-16 ENCOUNTER — Encounter: Payer: Self-pay | Admitting: Neurology

## 2014-01-17 NOTE — Telephone Encounter (Signed)
The patient was provided the results of his sleep study report.  He is aware that no significant sleep apnea was found but his test results did reveal hypoxemia with a prolonged hypoxic burden.  The patient has been advised that it is Dr. Edwena Felty recommendation that he be referred to a pulmonologist through his primary care physician, Dr. [name].  Dr. Philip Aspen has been faxed a copy of his sleep study results and the Joseph Soto will receive his test result via mail.  He is aware to contact our office with any questions or concerns.

## 2014-01-26 ENCOUNTER — Other Ambulatory Visit (HOSPITAL_COMMUNITY): Payer: Self-pay | Admitting: Respiratory Therapy

## 2014-01-26 DIAGNOSIS — J9601 Acute respiratory failure with hypoxia: Secondary | ICD-10-CM

## 2014-02-12 ENCOUNTER — Ambulatory Visit (HOSPITAL_COMMUNITY)
Admission: RE | Admit: 2014-02-12 | Discharge: 2014-02-12 | Disposition: A | Discharge status: Home / Self Care | Payer: Medicare HMO | Source: Ambulatory Visit | Attending: Internal Medicine | Admitting: Internal Medicine

## 2014-02-12 DIAGNOSIS — J9601 Acute respiratory failure with hypoxia: Secondary | ICD-10-CM | POA: Diagnosis not present

## 2014-02-12 LAB — PULMONARY FUNCTION TEST
DL/VA % pred: 61 %
DL/VA: 2.83 ml/min/mmHg/L
DLCO cor % pred: 66 %
DLCO cor: 21.64 ml/min/mmHg
DLCO unc % pred: 66 %
DLCO unc: 21.64 ml/min/mmHg
FEF 25-75 Post: 0.68 L/sec
FEF 25-75 Pre: 0.86 L/sec
FEF2575-%Change-Post: -20 %
FEF2575-%Pred-Post: 27 %
FEF2575-%Pred-Pre: 34 %
FEV1-%Change-Post: -4 %
FEV1-%Pred-Post: 83 %
FEV1-%Pred-Pre: 87 %
FEV1-Post: 2.73 L
FEV1-Pre: 2.85 L
FEV1FVC-%Change-Post: 4 %
FEV1FVC-%Pred-Pre: 63 %
FEV6-%Change-Post: -7 %
FEV6-%Pred-Post: 114 %
FEV6-%Pred-Pre: 124 %
FEV6-Post: 4.81 L
FEV6-Pre: 5.21 L
FEV6FVC-%Change-Post: 0 %
FEV6FVC-%Pred-Post: 91 %
FEV6FVC-%Pred-Pre: 90 %
FVC-%Change-Post: -8 %
FVC-%Pred-Post: 125 %
FVC-%Pred-Pre: 136 %
FVC-Post: 5.54 L
FVC-Pre: 6.05 L
Post FEV1/FVC ratio: 49 %
Post FEV6/FVC ratio: 87 %
Pre FEV1/FVC ratio: 47 %
Pre FEV6/FVC Ratio: 86 %
RV % pred: 171 %
RV: 4.21 L
TLC % pred: 146 %
TLC: 10.32 L

## 2014-02-12 MED ORDER — ALBUTEROL SULFATE (2.5 MG/3ML) 0.083% IN NEBU
2.5000 mg | INHALATION_SOLUTION | Freq: Once | RESPIRATORY_TRACT | Status: AC
  Administered 2014-02-12: 2.5 mg via RESPIRATORY_TRACT

## 2014-02-15 ENCOUNTER — Encounter: Payer: Self-pay | Admitting: Cardiovascular Disease

## 2014-02-15 ENCOUNTER — Ambulatory Visit (INDEPENDENT_AMBULATORY_CARE_PROVIDER_SITE_OTHER): Payer: Medicare HMO | Admitting: Cardiovascular Disease

## 2014-02-15 VITALS — BP 122/84 | HR 86 | Ht 71.5 in | Wt 196.2 lb

## 2014-02-15 DIAGNOSIS — E785 Hyperlipidemia, unspecified: Secondary | ICD-10-CM

## 2014-02-15 DIAGNOSIS — I25810 Atherosclerosis of coronary artery bypass graft(s) without angina pectoris: Secondary | ICD-10-CM

## 2014-02-15 NOTE — Patient Instructions (Signed)
Your physician wants you to follow-up in:  6 months. You will receive a reminder letter in the mail two months in advance. If you don't receive a letter, please call our office to schedule the follow-up appointment.   

## 2014-02-15 NOTE — Progress Notes (Signed)
History of Present Illness: 69 yo WM with history of CAD s/p CABG 2007, Non-hodgkins Lymphoma, HLD here today for cardiac follow up. He has been followed in the past by Dr. Doreatha Lew. Last cath February 2010 with occluded SVG to Diagonal and occluded SVG to PDA with LIMA to LAD patent. There was diffuse disease in distal vessels felt best to be managed medically. HIs CABG was in May 2007 by Dr. Roxan Hockey. Last stress test 2010 with inferior ischemia. He is followed in our lipid clinic. Last echo 10/31/13 with normal LV function.   He tells me that he is doing well. He has had no chest pain or SOB. Denies any palpitations, near syncope or syncope.He has ongoing issues with sinus pressure and pain and has seen a specialist in [place]. Now off of allergy shots.    Primary Care Physician: [name]  Last Lipid Profile: Followed in primary care   Past Medical History  Diagnosis Date  . HLD (hyperlipidemia)   . Non Hodgkin's lymphoma   . Dizziness     1 episode   . CAD (coronary artery disease)     Last cath 2010 per Dr. Doreatha Lew with diffuse multivessel disease, small caliber vessels not felt to be suitable for PCI    Past Surgical History  Procedure Laterality Date  . Coronary artery bypass graft  5/07    x5. LV dysfunction.   . Cholecystectomy    . Tonsillectomy    . Baker cyst removal      Current Outpatient Prescriptions  Medication Sig Dispense Refill  . cholecalciferol (VITAMIN D) 400 UNITS TABS Take 400 Units by mouth daily.    Marland Kitchen co-enzyme Q-10 30 MG capsule Take 100 mg by mouth daily.     . fish oil-omega-3 fatty acids 1000 MG capsule Take 2 g by mouth daily.      . meclizine (ANTIVERT) 25 MG tablet Take 1 tablet (25 mg total) by mouth 3 (three) times daily as needed for dizziness. 12 tablet 0  . metoprolol tartrate (LOPRESSOR) 25 MG tablet Take 0.5 tablets (12.5 mg total) by mouth 2 (two) times daily. 30 tablet 11  . Multiple Vitamin (MULTIVITAMIN) capsule  Take 1 capsule by mouth daily.      . nitroGLYCERIN (NITROSTAT) 0.4 MG SL tablet Place 1 tablet (0.4 mg total) under the tongue every 5 (five) minutes as needed for chest pain. 25 tablet 6  . nortriptyline (PAMELOR) 25 MG capsule Take 25 mg by mouth at bedtime. One table by mouth at bedtime    . ondansetron (ZOFRAN ODT) 4 MG disintegrating tablet 4mg  ODT q4 hours prn nausea/vomit 4 tablet 0  . Oxymetazoline HCl (AFRIN NASAL SPRAY NA) Place into the nose. Spray in each nostril twice daily    . RANEXA 500 MG 12 hr tablet TAKE 1 TABLET BY MOUTH EVERY DAY 30 tablet 1  . rosuvastatin (CRESTOR) 40 MG tablet TAKE 1 TABLET (40 MG TOTAL) BY MOUTH DAILY. 90 tablet 0  . triamcinolone (NASACORT) 55 MCG/ACT AERO nasal inhaler Place 1 spray into the nose daily. 1 Inhaler 12   No current facility-administered medications for this visit.    Allergies  Allergen Reactions  . Niaspan [Niacin Er]     Stomach intolerance    History   Social History  . Marital Status: Married    Spouse Name: [name]    Number of Children: 2  . Years of Education: college   Occupational History  . Camera operator  Social History Main Topics  . Smoking status: Former Research scientist (life sciences)  . Smokeless tobacco: Not on file     Comment: quit in 2007   . Alcohol Use: No  . Drug Use: No  . Sexual Activity: Not on file   Other Topics Concern  . Not on file   Social History Narrative   Married, 2 children.   Runs an executive recruiting company.     Family History  Problem Relation Age of Onset  . Heart disease Father   . Heart attack Father     Review of Systems:  As stated in the HPI and otherwise negative.   BP 122/84 mmHg  Pulse 86  Ht 5' 11.5" (1.816 m)  Wt 196 lb 3.2 oz (88.996 kg)  BMI 26.99 kg/m2  SpO2 96%  Physical Examination: General: Well developed, well nourished, NAD HEENT: OP clear, mucus membranes moist SKIN: warm, dry. No rashes. Neuro: No focal deficits Musculoskeletal: Muscle strength 5/5  all ext Psychiatric: Mood and affect normal Neck: No JVD, no carotid bruits, no thyromegaly, no lymphadenopathy. Lungs:Clear bilaterally, no wheezes, rhonci, crackles Cardiovascular: Regular rate and rhythm. No murmurs, gallops or rubs. Abdomen:Soft. Bowel sounds present. Non-tender.  Extremities: No lower extremity edema. Pulses are 2 + in the bilateral DP/PT.  Echo 10/31/13: Left ventricle: The cavity size was normal. Wall thickness was normal. LV mid ventricular false tendon. Systolic function was normal. The estimated ejection fraction was in the range of 55% to 60%. Mild inferior hypokinesis. Doppler parameters are consistent with abnormal left ventricular relaxation (grade 1 diastolic dysfunction). The E/e&' ratio is <8, suggesting normal LV filling pressure. - Mitral valve: Mildly thickened leaflets . There was trivial regurgitation. - Left atrium: The atrium was at the upper limits of normal in size. - Right atrium: The atrium was at the upper limits of normal in size.  Impressions:  - Compared to the prior echo in 2012, the EF is slightly higher at 55-60%, there is mild inferior hypokinesis. Diastolic dysfunction is present with normal LV filling pressure.  Assessment and Plan:   1. CAD: Stable. Continue ASA 81 mg po Qdaily. Will continue metoprolol at current dose. Continue statin. Continue Ranexa. Continue medical therapy for now. SL NTG to use prn.   2. Hyperlipidemia: Continue statin. He is followed in the lipid clinic.

## 2014-02-17 ENCOUNTER — Other Ambulatory Visit: Payer: Self-pay | Admitting: Cardiovascular Disease

## 2014-03-19 ENCOUNTER — Other Ambulatory Visit: Payer: Self-pay | Admitting: Cardiovascular Disease

## 2014-05-11 ENCOUNTER — Other Ambulatory Visit: Payer: Self-pay | Admitting: Internal Medicine

## 2014-05-11 DIAGNOSIS — J3489 Other specified disorders of nose and nasal sinuses: Secondary | ICD-10-CM

## 2014-05-15 ENCOUNTER — Ambulatory Visit
Admission: RE | Admit: 2014-05-15 | Discharge: 2014-05-15 | Disposition: A | Discharge status: Home / Self Care | Payer: Medicare HMO | Source: Ambulatory Visit | Attending: Internal Medicine | Admitting: Internal Medicine

## 2014-05-15 DIAGNOSIS — J3489 Other specified disorders of nose and nasal sinuses: Secondary | ICD-10-CM

## 2014-08-06 ENCOUNTER — Other Ambulatory Visit: Payer: Self-pay

## 2014-09-16 ENCOUNTER — Other Ambulatory Visit: Payer: Self-pay | Admitting: Cardiovascular Disease

## 2014-10-24 ENCOUNTER — Ambulatory Visit: Payer: Medicare HMO | Admitting: Cardiovascular Disease

## 2014-11-14 DIAGNOSIS — R0902 Hypoxemia: Secondary | ICD-10-CM | POA: Diagnosis not present

## 2014-12-05 ENCOUNTER — Other Ambulatory Visit: Payer: Self-pay | Admitting: Cardiovascular Disease

## 2014-12-15 DIAGNOSIS — R0902 Hypoxemia: Secondary | ICD-10-CM | POA: Diagnosis not present

## 2014-12-15 DIAGNOSIS — Z23 Encounter for immunization: Secondary | ICD-10-CM | POA: Diagnosis not present

## 2014-12-21 ENCOUNTER — Other Ambulatory Visit: Payer: Self-pay | Admitting: Cardiovascular Disease

## 2014-12-21 NOTE — Telephone Encounter (Signed)
Most recent lipid panel in epic is from 2014, and per last office visit he is followed in the lipid clinic. Please advise. Thanks, MI

## 2014-12-26 NOTE — Telephone Encounter (Signed)
OK to refill

## 2014-12-31 ENCOUNTER — Other Ambulatory Visit: Payer: Self-pay | Admitting: Cardiovascular Disease

## 2015-01-14 DIAGNOSIS — R0902 Hypoxemia: Secondary | ICD-10-CM | POA: Diagnosis not present

## 2015-02-13 NOTE — Progress Notes (Signed)
Chief Complaint  Patient presents with  . Follow-up     History of Present Illness: 70 yo WM with history of CAD s/p CABG 2007, Non-hodgkins Lymphoma, HLD here today for cardiac follow up. He has been followed in the past by Dr. Doreatha Lew. Last cath February 2010 with occluded SVG to Diagonal and occluded SVG to PDA with LIMA to LAD patent. There was diffuse disease in distal vessels felt best to be managed medically. HIs CABG was in May 2007 by Dr. Roxan Hockey. Last stress test 2010 with inferior ischemia. He is followed in our lipid clinic. Last echo 10/31/13 with normal LV function.   He tells me that he is doing well. He has had no chest pain or SOB. Denies any palpitations, near syncope or syncope. He has ongoing issues with sinus pressure and pain and has seen some improvement with recent use of Flexeril.     Primary Care Physician: [name]  Last Lipid Profile: Followed in primary care   Past Medical History  Diagnosis Date  . HLD (hyperlipidemia)   . Non Hodgkin's lymphoma ([place])   . Dizziness     1 episode   . CAD (coronary artery disease)     Last cath 2010 per Dr. Doreatha Lew with diffuse multivessel disease, small caliber vessels not felt to be suitable for PCI    Past Surgical History  Procedure Laterality Date  . Coronary artery bypass graft  5/07    x5. LV dysfunction.   . Cholecystectomy    . Tonsillectomy    . Baker cyst removal      Current Outpatient Prescriptions  Medication Sig Dispense Refill  . aspirin EC 81 MG tablet Take 81 mg by mouth daily.    . cholecalciferol (VITAMIN D) 400 UNITS TABS Take 400 Units by mouth daily.    Marland Kitchen co-enzyme Q-10 30 MG capsule Take 100 mg by mouth daily.     . cyclobenzaprine (FLEXERIL) 10 MG tablet Take 10 mg by mouth 3 (three) times daily as needed. (MUSCLE SPASMS)  2  . fish oil-omega-3 fatty acids 1000 MG capsule Take 2 g by mouth daily.      Marland Kitchen gabapentin (NEURONTIN) 300 MG capsule Take 600 mg by mouth 2 (two)  times daily.  12  . meclizine (ANTIVERT) 25 MG tablet Take 1 tablet (25 mg total) by mouth 3 (three) times daily as needed for dizziness. 12 tablet 0  . metoprolol tartrate (LOPRESSOR) 25 MG tablet TAKE 0.5 TABLETS (12.5 MG TOTAL) BY MOUTH 2 (TWO) TIMES DAILY. 30 tablet 3  . Multiple Vitamin (MULTIVITAMIN) capsule Take 1 capsule by mouth daily.      . nitroGLYCERIN (NITROSTAT) 0.4 MG SL tablet Place 1 tablet (0.4 mg total) under the tongue every 5 (five) minutes as needed for chest pain. 25 tablet 6  . ondansetron (ZOFRAN ODT) 4 MG disintegrating tablet 4mg  ODT q4 hours prn nausea/vomit 4 tablet 0  . rosuvastatin (CRESTOR) 40 MG tablet TAKE 1 TABLET BY MOUTH EVERY DAY 30 tablet 1  . triamcinolone (NASACORT) 55 MCG/ACT AERO nasal inhaler Place 1 spray into the nose daily. 1 Inhaler 12   No current facility-administered medications for this visit.    Allergies  Allergen Reactions  . Niaspan [Niacin Er]     Stomach intolerance    Social History   Social History  . Marital Status: Married    Spouse Name: [name]  . Number of Children: 2  . Years of Education: college  Occupational History  . Camera operator    Social History Main Topics  . Smoking status: Former Research scientist (life sciences)  . Smokeless tobacco: Never Used     Comment: quit in 2007   . Alcohol Use: No  . Drug Use: No  . Sexual Activity: Not on file   Other Topics Concern  . Not on file   Social History Narrative   Married, 2 children.   Runs an executive recruiting company.     Family History  Problem Relation Age of Onset  . Heart disease Father   . Heart attack Father     Review of Systems:  As stated in the HPI and otherwise negative.   BP 112/74 mmHg  Pulse 84  Ht 5\' 11"  (1.803 m)  Wt 197 lb 6.4 oz (89.54 kg)  BMI 27.54 kg/m2  Physical Examination: General: Well developed, well nourished, NAD HEENT: OP clear, mucus membranes moist SKIN: warm, dry. No rashes. Neuro: No focal deficits Musculoskeletal:  Muscle strength 5/5 all ext Psychiatric: Mood and affect normal Neck: No JVD, no carotid bruits, no thyromegaly, no lymphadenopathy. Lungs:Clear bilaterally, no wheezes, rhonci, crackles Cardiovascular: Regular rate and rhythm. No murmurs, gallops or rubs. Abdomen:Soft. Bowel sounds present. Non-tender.  Extremities: No lower extremity edema. Pulses are 2 + in the bilateral DP/PT.  Echo 10/31/13: Left ventricle: The cavity size was normal. Wall thickness was normal. LV mid ventricular false tendon. Systolic function was normal. The estimated ejection fraction was in the range of 55% to 60%. Mild inferior hypokinesis. Doppler parameters are consistent with abnormal left ventricular relaxation (grade 1 diastolic dysfunction). The E/e&' ratio is <8, suggesting normal LV filling pressure. - Mitral valve: Mildly thickened leaflets . There was trivial regurgitation. - Left atrium: The atrium was at the upper limits of normal in size. - Right atrium: The atrium was at the upper limits of normal in size.  Impressions:  - Compared to the prior echo in 2012, the EF is slightly higher at 55-60%, there is mild inferior hypokinesis. Diastolic dysfunction is present with normal LV filling pressure.  EKG:  EKG is ordered today. The ekg ordered today demonstrates NSR, rate 84 bpm.   Recent Labs: No results found for requested labs within last 365 days.   Lipid Panel Followed in primary care   Wt Readings from Last 3 Encounters:  02/14/15 197 lb 6.4 oz (89.54 kg)  02/15/14 196 lb 3.2 oz (88.996 kg)  11/20/13 185 lb (83.915 kg)     Other studies Reviewed: Additional studies/ records that were reviewed today include: . Review of the above records demonstrates:    Assessment and Plan:   1. CAD: Stable. Continue ASA 81 mg po Qdaily. Will continue metoprolol at current dose. Continue statin. Continue medical therapy for now. SL NTG to use prn.   2. Hyperlipidemia:  Continue statin. Lipids checked in primary care.   Current medicines are reviewed at length with the patient today.  The patient does not have concerns regarding medicines.  The following changes have been made:  no change  Labs/ tests ordered today include:  No orders of the defined types were placed in this encounter.    Disposition:   FU with me in 12  months  Signed, Lauree Chandler, MD 02/14/2015 10:14 AM    [facility] [address], [place], [place]  [address] Phone: [phone]; Fax: [phone]

## 2015-02-14 ENCOUNTER — Ambulatory Visit (INDEPENDENT_AMBULATORY_CARE_PROVIDER_SITE_OTHER): Payer: Medicare HMO | Admitting: Cardiovascular Disease

## 2015-02-14 ENCOUNTER — Encounter: Payer: Self-pay | Admitting: Cardiovascular Disease

## 2015-02-14 VITALS — BP 112/74 | HR 84 | Ht 71.0 in | Wt 197.4 lb

## 2015-02-14 DIAGNOSIS — E785 Hyperlipidemia, unspecified: Secondary | ICD-10-CM

## 2015-02-14 DIAGNOSIS — I25810 Atherosclerosis of coronary artery bypass graft(s) without angina pectoris: Secondary | ICD-10-CM | POA: Diagnosis not present

## 2015-02-14 DIAGNOSIS — R0902 Hypoxemia: Secondary | ICD-10-CM | POA: Diagnosis not present

## 2015-02-14 NOTE — Patient Instructions (Signed)

## 2015-03-11 DIAGNOSIS — T162XXA Foreign body in left ear, initial encounter: Secondary | ICD-10-CM | POA: Diagnosis not present

## 2015-03-17 DIAGNOSIS — R0902 Hypoxemia: Secondary | ICD-10-CM | POA: Diagnosis not present

## 2015-04-14 DIAGNOSIS — R0902 Hypoxemia: Secondary | ICD-10-CM | POA: Diagnosis not present

## 2015-05-15 DIAGNOSIS — R0902 Hypoxemia: Secondary | ICD-10-CM | POA: Diagnosis not present

## 2015-05-30 DIAGNOSIS — I10 Essential (primary) hypertension: Secondary | ICD-10-CM | POA: Diagnosis not present

## 2015-05-30 DIAGNOSIS — E7800 Pure hypercholesterolemia, unspecified: Secondary | ICD-10-CM | POA: Diagnosis not present

## 2015-06-14 DIAGNOSIS — R0902 Hypoxemia: Secondary | ICD-10-CM | POA: Diagnosis not present

## 2015-06-19 ENCOUNTER — Other Ambulatory Visit: Payer: Self-pay | Admitting: *Deleted

## 2015-06-19 MED ORDER — METOPROLOL TARTRATE 25 MG PO TABS
ORAL_TABLET | ORAL | Status: DC
Start: 2015-06-19 — End: 2016-04-03

## 2015-07-07 ENCOUNTER — Other Ambulatory Visit: Payer: Self-pay | Admitting: Cardiovascular Disease

## 2015-07-15 DIAGNOSIS — R0902 Hypoxemia: Secondary | ICD-10-CM | POA: Diagnosis not present

## 2015-08-01 DIAGNOSIS — R0981 Nasal congestion: Secondary | ICD-10-CM | POA: Diagnosis not present

## 2015-08-01 DIAGNOSIS — J988 Other specified respiratory disorders: Secondary | ICD-10-CM | POA: Diagnosis not present

## 2015-08-01 DIAGNOSIS — R05 Cough: Secondary | ICD-10-CM | POA: Diagnosis not present

## 2015-08-01 DIAGNOSIS — J069 Acute upper respiratory infection, unspecified: Secondary | ICD-10-CM | POA: Diagnosis not present

## 2015-08-14 DIAGNOSIS — R0902 Hypoxemia: Secondary | ICD-10-CM | POA: Diagnosis not present

## 2015-08-19 DIAGNOSIS — I10 Essential (primary) hypertension: Secondary | ICD-10-CM | POA: Diagnosis not present

## 2015-08-19 DIAGNOSIS — E784 Other hyperlipidemia: Secondary | ICD-10-CM | POA: Diagnosis not present

## 2015-08-19 DIAGNOSIS — Z125 Encounter for screening for malignant neoplasm of prostate: Secondary | ICD-10-CM | POA: Diagnosis not present

## 2015-08-29 DIAGNOSIS — Z0000 Encounter for general adult medical examination without abnormal findings: Secondary | ICD-10-CM | POA: Diagnosis not present

## 2015-08-29 DIAGNOSIS — Z1389 Encounter for screening for other disorder: Secondary | ICD-10-CM | POA: Diagnosis not present

## 2015-08-29 DIAGNOSIS — J3089 Other allergic rhinitis: Secondary | ICD-10-CM | POA: Diagnosis not present

## 2015-08-29 DIAGNOSIS — E784 Other hyperlipidemia: Secondary | ICD-10-CM | POA: Diagnosis not present

## 2015-08-29 DIAGNOSIS — Z6827 Body mass index (BMI) 27.0-27.9, adult: Secondary | ICD-10-CM | POA: Diagnosis not present

## 2015-08-29 DIAGNOSIS — R0902 Hypoxemia: Secondary | ICD-10-CM | POA: Diagnosis not present

## 2015-08-29 DIAGNOSIS — I10 Essential (primary) hypertension: Secondary | ICD-10-CM | POA: Diagnosis not present

## 2015-08-29 DIAGNOSIS — I2510 Atherosclerotic heart disease of native coronary artery without angina pectoris: Secondary | ICD-10-CM | POA: Diagnosis not present

## 2015-08-29 DIAGNOSIS — M545 Low back pain: Secondary | ICD-10-CM | POA: Diagnosis not present

## 2015-08-29 DIAGNOSIS — C8590 Non-Hodgkin lymphoma, unspecified, unspecified site: Secondary | ICD-10-CM | POA: Diagnosis not present

## 2015-09-14 DIAGNOSIS — R0902 Hypoxemia: Secondary | ICD-10-CM | POA: Diagnosis not present

## 2015-10-15 DIAGNOSIS — R0902 Hypoxemia: Secondary | ICD-10-CM | POA: Diagnosis not present

## 2015-10-21 ENCOUNTER — Ambulatory Visit: Payer: Self-pay | Admitting: General Surgery

## 2015-10-21 DIAGNOSIS — L729 Follicular cyst of the skin and subcutaneous tissue, unspecified: Secondary | ICD-10-CM | POA: Diagnosis not present

## 2015-11-06 ENCOUNTER — Encounter (HOSPITAL_BASED_OUTPATIENT_CLINIC_OR_DEPARTMENT_OTHER): Payer: Self-pay | Admitting: *Deleted

## 2015-11-07 ENCOUNTER — Ambulatory Visit
Admission: RE | Admit: 2015-11-07 | Discharge: 2015-11-07 | Disposition: A | Discharge status: Home / Self Care | Payer: Medicare HMO | Source: Ambulatory Visit | Attending: General Surgery | Admitting: General Surgery

## 2015-11-07 ENCOUNTER — Encounter (HOSPITAL_BASED_OUTPATIENT_CLINIC_OR_DEPARTMENT_OTHER)
Admission: RE | Admit: 2015-11-07 | Discharge: 2015-11-07 | Disposition: A | Discharge status: Home / Self Care | Payer: Medicare HMO | Source: Ambulatory Visit | Attending: General Surgery | Admitting: General Surgery

## 2015-11-07 ENCOUNTER — Other Ambulatory Visit: Payer: Self-pay | Admitting: General Surgery

## 2015-11-07 DIAGNOSIS — I2510 Atherosclerotic heart disease of native coronary artery without angina pectoris: Secondary | ICD-10-CM | POA: Diagnosis not present

## 2015-11-07 DIAGNOSIS — I252 Old myocardial infarction: Secondary | ICD-10-CM | POA: Diagnosis not present

## 2015-11-07 DIAGNOSIS — C44599 Other specified malignant neoplasm of skin of other part of trunk: Secondary | ICD-10-CM | POA: Diagnosis not present

## 2015-11-07 DIAGNOSIS — Z01818 Encounter for other preprocedural examination: Secondary | ICD-10-CM

## 2015-11-07 DIAGNOSIS — Z8572 Personal history of non-Hodgkin lymphomas: Secondary | ICD-10-CM | POA: Diagnosis not present

## 2015-11-07 DIAGNOSIS — Z7982 Long term (current) use of aspirin: Secondary | ICD-10-CM | POA: Diagnosis not present

## 2015-11-07 DIAGNOSIS — M797 Fibromyalgia: Secondary | ICD-10-CM | POA: Diagnosis not present

## 2015-11-07 DIAGNOSIS — Z8249 Family history of ischemic heart disease and other diseases of the circulatory system: Secondary | ICD-10-CM | POA: Diagnosis not present

## 2015-11-07 DIAGNOSIS — Z951 Presence of aortocoronary bypass graft: Secondary | ICD-10-CM | POA: Diagnosis not present

## 2015-11-07 DIAGNOSIS — E785 Hyperlipidemia, unspecified: Secondary | ICD-10-CM | POA: Diagnosis not present

## 2015-11-07 DIAGNOSIS — R222 Localized swelling, mass and lump, trunk: Secondary | ICD-10-CM | POA: Diagnosis present

## 2015-11-07 DIAGNOSIS — Z87891 Personal history of nicotine dependence: Secondary | ICD-10-CM | POA: Diagnosis not present

## 2015-11-07 LAB — BASIC METABOLIC PANEL
Anion gap: 8 (ref 5–15)
BUN: 13 mg/dL (ref 6–20)
CO2: 22 mmol/L (ref 22–32)
Calcium: 8.7 mg/dL — ABNORMAL LOW (ref 8.9–10.3)
Chloride: 107 mmol/L (ref 101–111)
Creatinine, Ser: 0.99 mg/dL (ref 0.61–1.24)
GFR calc Af Amer: >60 mL/min (ref >60)
GFR calc non Af Amer: >60 mL/min (ref >60)
Glucose, Bld: 90 mg/dL (ref 65–99)
Potassium: 4.4 mmol/L (ref 3.5–5.1)
Sodium: 137 mmol/L (ref 135–145)

## 2015-11-07 LAB — CBC WITH DIFFERENTIAL/PLATELET
Basophils Absolute: 0.1 10*3/uL (ref 0.0–0.1)
Basophils Relative: 1 %
Eosinophils Absolute: 0.3 10*3/uL (ref 0.0–0.7)
Eosinophils Relative: 4 %
HCT: 39.2 % (ref 39.0–52.0)
Hemoglobin: 12.8 g/dL — ABNORMAL LOW (ref 13.0–17.0)
Lymphocytes Relative: 20 %
Lymphs Abs: 1.6 10*3/uL (ref 0.7–4.0)
MCH: 29.2 pg (ref 26.0–34.0)
MCHC: 32.7 g/dL (ref 30.0–36.0)
MCV: 89.3 fL (ref 78.0–100.0)
Monocytes Absolute: 0.7 10*3/uL (ref 0.1–1.0)
Monocytes Relative: 8 %
Neutro Abs: 5.4 10*3/uL (ref 1.7–7.7)
Neutrophils Relative %: 67 %
Platelets: 242 10*3/uL (ref 150–400)
RBC: 4.39 MIL/uL (ref 4.22–5.81)
RDW: 14.4 % (ref 11.5–15.5)
WBC: 8.1 10*3/uL (ref 4.0–10.5)

## 2015-11-10 DIAGNOSIS — C493 Malignant neoplasm of connective and soft tissue of thorax: Secondary | ICD-10-CM

## 2015-11-10 HISTORY — DX: Malignant neoplasm of connective and soft tissue of thorax: C49.3

## 2015-11-11 ENCOUNTER — Encounter (HOSPITAL_BASED_OUTPATIENT_CLINIC_OR_DEPARTMENT_OTHER): Payer: Self-pay | Admitting: Anesthesiology

## 2015-11-11 ENCOUNTER — Ambulatory Visit (HOSPITAL_BASED_OUTPATIENT_CLINIC_OR_DEPARTMENT_OTHER): Payer: Medicare HMO | Admitting: Anesthesiology

## 2015-11-11 ENCOUNTER — Ambulatory Visit (HOSPITAL_BASED_OUTPATIENT_CLINIC_OR_DEPARTMENT_OTHER)
Admission: RE | Admit: 2015-11-11 | Discharge: 2015-11-11 | Disposition: A | Discharge status: Home / Self Care | Payer: Medicare HMO | Source: Ambulatory Visit | Attending: General Surgery | Admitting: General Surgery

## 2015-11-11 ENCOUNTER — Encounter (HOSPITAL_BASED_OUTPATIENT_CLINIC_OR_DEPARTMENT_OTHER)
Admission: RE | Disposition: A | Discharge status: Home / Self Care | Payer: Self-pay | Source: Ambulatory Visit | Attending: General Surgery

## 2015-11-11 DIAGNOSIS — C493 Malignant neoplasm of connective and soft tissue of thorax: Secondary | ICD-10-CM | POA: Diagnosis not present

## 2015-11-11 DIAGNOSIS — Z7982 Long term (current) use of aspirin: Secondary | ICD-10-CM | POA: Diagnosis not present

## 2015-11-11 DIAGNOSIS — Z01818 Encounter for other preprocedural examination: Secondary | ICD-10-CM

## 2015-11-11 DIAGNOSIS — I2510 Atherosclerotic heart disease of native coronary artery without angina pectoris: Secondary | ICD-10-CM | POA: Diagnosis not present

## 2015-11-11 DIAGNOSIS — I252 Old myocardial infarction: Secondary | ICD-10-CM | POA: Insufficient documentation

## 2015-11-11 DIAGNOSIS — R222 Localized swelling, mass and lump, trunk: Secondary | ICD-10-CM | POA: Diagnosis not present

## 2015-11-11 DIAGNOSIS — Z87891 Personal history of nicotine dependence: Secondary | ICD-10-CM | POA: Diagnosis not present

## 2015-11-11 DIAGNOSIS — M797 Fibromyalgia: Secondary | ICD-10-CM | POA: Insufficient documentation

## 2015-11-11 DIAGNOSIS — Z951 Presence of aortocoronary bypass graft: Secondary | ICD-10-CM | POA: Diagnosis not present

## 2015-11-11 DIAGNOSIS — Z8572 Personal history of non-Hodgkin lymphomas: Secondary | ICD-10-CM | POA: Insufficient documentation

## 2015-11-11 DIAGNOSIS — C44599 Other specified malignant neoplasm of skin of other part of trunk: Secondary | ICD-10-CM | POA: Diagnosis not present

## 2015-11-11 DIAGNOSIS — Z8249 Family history of ischemic heart disease and other diseases of the circulatory system: Secondary | ICD-10-CM | POA: Diagnosis not present

## 2015-11-11 DIAGNOSIS — E785 Hyperlipidemia, unspecified: Secondary | ICD-10-CM | POA: Diagnosis not present

## 2015-11-11 HISTORY — DX: Acute myocardial infarction, unspecified: I21.9

## 2015-11-11 HISTORY — DX: Fibromyalgia: M79.7

## 2015-11-11 HISTORY — PX: MASS EXCISION: SHX2000

## 2015-11-11 SURGERY — EXCISION MASS
Anesthesia: Monitor Anesthesia Care | Site: Chest

## 2015-11-11 MED ORDER — BUPIVACAINE-EPINEPHRINE (PF) 0.5% -1:200000 IJ SOLN
INTRAMUSCULAR | Status: AC
  Filled 2015-11-11: qty 30

## 2015-11-11 MED ORDER — HEPARIN (PORCINE) IN NACL 2-0.9 UNIT/ML-% IJ SOLN
INTRAMUSCULAR | Status: AC
  Filled 2015-11-11: qty 500

## 2015-11-11 MED ORDER — MIDAZOLAM HCL 2 MG/2ML IJ SOLN: 1.0000 mg | INTRAMUSCULAR | Status: DC | PRN

## 2015-11-11 MED ORDER — CHLORHEXIDINE GLUCONATE CLOTH 2 % EX PADS: 6.0000 | MEDICATED_PAD | Freq: Once | CUTANEOUS | Status: DC

## 2015-11-11 MED ORDER — FENTANYL CITRATE (PF) 100 MCG/2ML IJ SOLN
INTRAMUSCULAR | Status: DC | PRN
  Administered 2015-11-11 (×2): 50 ug via INTRAVENOUS

## 2015-11-11 MED ORDER — HEPARIN SOD (PORK) LOCK FLUSH 100 UNIT/ML IV SOLN
INTRAVENOUS | Status: AC
  Filled 2015-11-11: qty 5

## 2015-11-11 MED ORDER — PROPOFOL 10 MG/ML IV BOLUS
INTRAVENOUS | Status: AC
  Filled 2015-11-11: qty 20

## 2015-11-11 MED ORDER — PROPOFOL 10 MG/ML IV BOLUS
INTRAVENOUS | Status: DC | PRN
  Administered 2015-11-11 (×3): 20 mg via INTRAVENOUS
  Administered 2015-11-11 (×2): 30 mg via INTRAVENOUS
  Administered 2015-11-11: 20 mg via INTRAVENOUS

## 2015-11-11 MED ORDER — ONDANSETRON HCL 4 MG/2ML IJ SOLN
INTRAMUSCULAR | Status: AC
  Filled 2015-11-11: qty 2

## 2015-11-11 MED ORDER — LIDOCAINE-EPINEPHRINE (PF) 1 %-1:200000 IJ SOLN
INTRAMUSCULAR | Status: AC
  Filled 2015-11-11: qty 30

## 2015-11-11 MED ORDER — ONDANSETRON HCL 4 MG/2ML IJ SOLN
INTRAMUSCULAR | Status: DC | PRN
  Administered 2015-11-11: 4 mg via INTRAVENOUS

## 2015-11-11 MED ORDER — FENTANYL CITRATE (PF) 100 MCG/2ML IJ SOLN
INTRAMUSCULAR | Status: AC
  Filled 2015-11-11: qty 2

## 2015-11-11 MED ORDER — LACTATED RINGERS IV SOLN
INTRAVENOUS | Status: DC | PRN
  Administered 2015-11-11: 09:00:00 via INTRAVENOUS

## 2015-11-11 MED ORDER — LACTATED RINGERS IV SOLN: INTRAVENOUS | Status: DC

## 2015-11-11 MED ORDER — BUPIVACAINE-EPINEPHRINE (PF) 0.25% -1:200000 IJ SOLN
INTRAMUSCULAR | Status: AC
  Filled 2015-11-11: qty 30

## 2015-11-11 MED ORDER — CEFAZOLIN SODIUM-DEXTROSE 2-4 GM/100ML-% IV SOLN
2.0000 g | INTRAVENOUS | Status: AC
  Administered 2015-11-11: 2 g via INTRAVENOUS

## 2015-11-11 MED ORDER — FENTANYL CITRATE (PF) 100 MCG/2ML IJ SOLN
25.0000 ug | INTRAMUSCULAR | Status: DC | PRN
  Administered 2015-11-11: 50 ug via INTRAVENOUS

## 2015-11-11 MED ORDER — OXYCODONE HCL 5 MG PO TABS
ORAL_TABLET | ORAL | Status: AC
Start: 2015-11-11 — End: 2015-11-11
  Filled 2015-11-11: qty 1

## 2015-11-11 MED ORDER — CEFAZOLIN SODIUM 1 G IJ SOLR
INTRAMUSCULAR | Status: AC
  Filled 2015-11-11: qty 20

## 2015-11-11 MED ORDER — SODIUM BICARBONATE 4 % IV SOLN
INTRAVENOUS | Status: AC
  Filled 2015-11-11: qty 5

## 2015-11-11 MED ORDER — SODIUM CHLORIDE 0.9 % IJ SOLN
INTRAMUSCULAR | Status: AC
  Filled 2015-11-11: qty 10

## 2015-11-11 MED ORDER — SCOPOLAMINE 1 MG/3DAYS TD PT72: 1.0000 | MEDICATED_PATCH | Freq: Once | TRANSDERMAL | Status: DC | PRN

## 2015-11-11 MED ORDER — LIDOCAINE HCL (PF) 1 % IJ SOLN
INTRAMUSCULAR | Status: DC | PRN
  Administered 2015-11-11: 30 mL

## 2015-11-11 MED ORDER — LIDOCAINE HCL (PF) 1 % IJ SOLN
INTRAMUSCULAR | Status: AC
  Filled 2015-11-11: qty 30

## 2015-11-11 MED ORDER — TRAMADOL HCL 50 MG PO TABS: 50.0000 mg | ORAL_TABLET | Freq: Four times a day (QID) | ORAL | 0 refills | Status: DC | PRN

## 2015-11-11 MED ORDER — PROMETHAZINE HCL 25 MG/ML IJ SOLN: 6.2500 mg | INTRAMUSCULAR | Status: DC | PRN

## 2015-11-11 MED ORDER — FENTANYL CITRATE (PF) 100 MCG/2ML IJ SOLN: 50.0000 ug | INTRAMUSCULAR | Status: DC | PRN

## 2015-11-11 MED ORDER — OXYCODONE HCL 5 MG PO TABS
5.0000 mg | ORAL_TABLET | Freq: Once | ORAL | Status: AC
  Administered 2015-11-11: 5 mg via ORAL

## 2015-11-11 MED ORDER — LIDOCAINE 2% (20 MG/ML) 5 ML SYRINGE
INTRAMUSCULAR | Status: AC
  Filled 2015-11-11: qty 5

## 2015-11-11 MED ORDER — GLYCOPYRROLATE 0.2 MG/ML IJ SOLN: 0.2000 mg | Freq: Once | INTRAMUSCULAR | Status: DC | PRN

## 2015-11-11 MED ORDER — LIDOCAINE HCL (CARDIAC) 20 MG/ML IV SOLN
INTRAVENOUS | Status: DC | PRN
  Administered 2015-11-11: 10 mg via INTRAVENOUS

## 2015-11-11 MED ORDER — METHYLENE BLUE 0.5 % INJ SOLN
INTRAVENOUS | Status: AC
  Filled 2015-11-11: qty 10

## 2015-11-11 SURGICAL SUPPLY — 56 items
ADH SKN CLS APL DERMABOND .7 (GAUZE/BANDAGES/DRESSINGS) ×1
BLADE CLIPPER SURG (BLADE) IMPLANT
BLADE SURG 15 STRL LF DISP TIS (BLADE) ×1 IMPLANT
BLADE SURG 15 STRL SS (BLADE) ×2
BNDG ADH 5X4 AIR PERM ELC (GAUZE/BANDAGES/DRESSINGS)
BNDG COHESIVE 4X5 WHT NS (GAUZE/BANDAGES/DRESSINGS) IMPLANT
BNDG GAUZE ELAST 4 BULKY (GAUZE/BANDAGES/DRESSINGS) IMPLANT
CANISTER SUCT 1200ML W/VALVE (MISCELLANEOUS) IMPLANT
CHLORAPREP W/TINT 26ML (MISCELLANEOUS) ×2 IMPLANT
CLEANER CAUTERY TIP 5X5 PAD (MISCELLANEOUS) ×1 IMPLANT
COVER BACK TABLE 60X90IN (DRAPES) ×2 IMPLANT
COVER MAYO STAND STRL (DRAPES) ×2 IMPLANT
COVER SURGICAL LIGHT HANDLE (MISCELLANEOUS) ×1 IMPLANT
DECANTER SPIKE VIAL GLASS SM (MISCELLANEOUS) IMPLANT
DERMABOND ADVANCED (GAUZE/BANDAGES/DRESSINGS) ×1
DERMABOND ADVANCED .7 DNX12 (GAUZE/BANDAGES/DRESSINGS) IMPLANT
DRAPE LAPAROTOMY T 102X78X121 (DRAPES) ×1 IMPLANT
DRAPE UTILITY XL STRL (DRAPES) ×4 IMPLANT
DRSG TEGADERM 4X4.75 (GAUZE/BANDAGES/DRESSINGS) ×2 IMPLANT
ELECT REM PT RETURN 9FT ADLT (ELECTROSURGICAL) ×2
ELECTRODE REM PT RTRN 9FT ADLT (ELECTROSURGICAL) ×1 IMPLANT
GLOVE BIOGEL PI IND STRL 7.0 (GLOVE) IMPLANT
GLOVE BIOGEL PI IND STRL 8 (GLOVE) ×1 IMPLANT
GLOVE BIOGEL PI INDICATOR 7.0 (GLOVE) ×2
GLOVE BIOGEL PI INDICATOR 8 (GLOVE) ×1
GLOVE ECLIPSE 6.5 STRL STRAW (GLOVE) ×1 IMPLANT
GLOVE ECLIPSE 7.5 STRL STRAW (GLOVE) ×2 IMPLANT
GOWN STRL REUS W/ TWL LRG LVL3 (GOWN DISPOSABLE) ×1 IMPLANT
GOWN STRL REUS W/TWL LRG LVL3 (GOWN DISPOSABLE) ×2
NEEDLE HYPO 25X1 1.5 SAFETY (NEEDLE) ×2 IMPLANT
NS IRRIG 1000ML POUR BTL (IV SOLUTION) IMPLANT
PACK BASIN DAY SURGERY FS (CUSTOM PROCEDURE TRAY) ×2 IMPLANT
PAD CLEANER CAUTERY TIP 5X5 (MISCELLANEOUS) ×1
PENCIL BUTTON HOLSTER BLD 10FT (ELECTRODE) ×2 IMPLANT
SLEEVE SCD COMPRESS KNEE MED (MISCELLANEOUS) ×2 IMPLANT
SPONGE GAUZE 4X4 12PLY STER LF (GAUZE/BANDAGES/DRESSINGS) IMPLANT
SPONGE LAP 4X18 X RAY DECT (DISPOSABLE) ×1 IMPLANT
STRIP CLOSURE SKIN 1/4X4 (GAUZE/BANDAGES/DRESSINGS) ×2 IMPLANT
SUCTION FRAZIER HANDLE 10FR (MISCELLANEOUS)
SUCTION TUBE FRAZIER 10FR DISP (MISCELLANEOUS) IMPLANT
SUT ETHILON 3 0 PS 1 (SUTURE) IMPLANT
SUT ETHILON 4 0 PS 2 18 (SUTURE) IMPLANT
SUT MNCRL AB 3-0 PS2 18 (SUTURE) ×2 IMPLANT
SUT MON AB 4-0 PC3 18 (SUTURE) IMPLANT
SUT PROLENE 4 0 PS 2 18 (SUTURE) IMPLANT
SUT VIC AB 2-0 SH 27 (SUTURE)
SUT VIC AB 2-0 SH 27XBRD (SUTURE) IMPLANT
SUT VIC AB 4-0 SH 27 (SUTURE) ×6
SUT VIC AB 4-0 SH 27XANBCTRL (SUTURE) ×3 IMPLANT
SYR BULB 3OZ (MISCELLANEOUS) IMPLANT
SYR CONTROL 10ML LL (SYRINGE) ×2 IMPLANT
TOWEL OR 17X24 6PK STRL BLUE (TOWEL DISPOSABLE) ×2 IMPLANT
TOWEL OR NON WOVEN STRL DISP B (DISPOSABLE) IMPLANT
TUBE CONNECTING 20X1/4 (TUBING) IMPLANT
UNDERPAD 30X30 (UNDERPADS AND DIAPERS) IMPLANT
YANKAUER SUCT BULB TIP NO VENT (SUCTIONS) IMPLANT

## 2015-11-11 NOTE — H&P (Signed)
Joseph Soto is an 70 y.o. male.   Chief Complaint: Cyst on chest wall HPI: Has been there for a while but implicated in some systemic symptoms that has him concerned.  Locally symptomatic also.  Past Medical History:  Diagnosis Date  . CAD (coronary artery disease)    Last cath 2010 per Dr. Doreatha Lew with diffuse multivessel disease, small caliber vessels not felt to be suitable for PCI  . Dizziness    1 episode   . Fibromyalgia   . HLD (hyperlipidemia)   . Myocardial infarction   . Non Hodgkin's lymphoma [facility])     Past Surgical History:  Procedure Laterality Date  . Baker cyst removal    . CHOLECYSTECTOMY    . CORONARY ARTERY BYPASS GRAFT  5/07   x5. LV dysfunction.   . TONSILLECTOMY      Family History  Problem Relation Age of Onset  . Heart disease Father   . Heart attack Father    Social History:  reports that he has quit smoking. He has never used smokeless tobacco. He reports that he does not drink alcohol or use drugs.  Allergies:  Allergies  Allergen Reactions  . Niaspan [Niacin Er]     Stomach intolerance    Medications Prior to Admission  Medication Sig Dispense Refill  . aspirin EC 81 MG tablet Take 81 mg by mouth daily.    . cholecalciferol (VITAMIN D) 400 UNITS TABS Take 400 Units by mouth daily.    Marland Kitchen co-enzyme Q-10 30 MG capsule Take 100 mg by mouth daily.     . cyclobenzaprine (FLEXERIL) 10 MG tablet Take 10 mg by mouth 2 (two) times daily. (MUSCLE SPASMS)  2  . fish oil-omega-3 fatty acids 1000 MG capsule Take 2 g by mouth daily.      Marland Kitchen gabapentin (NEURONTIN) 300 MG capsule Take 600 mg by mouth 2 (two) times daily.  12  . metoprolol tartrate (LOPRESSOR) 25 MG tablet TAKE 0.5 TABLETS (12.5 MG TOTAL) BY MOUTH 2 (TWO) TIMES DAILY. 90 tablet 2  . Multiple Vitamin (MULTIVITAMIN) capsule Take 1 capsule by mouth daily.      . rosuvastatin (CRESTOR) 40 MG tablet TAKE 1 TABLET BY MOUTH EVERY DAY 90 tablet 2  . nitroGLYCERIN (NITROSTAT) 0.4 MG SL tablet  Place 1 tablet (0.4 mg total) under the tongue every 5 (five) minutes as needed for chest pain. 25 tablet 6  . ondansetron (ZOFRAN ODT) 4 MG disintegrating tablet 4mg  ODT q4 hours prn nausea/vomit 4 tablet 0  . triamcinolone (NASACORT) 55 MCG/ACT AERO nasal inhaler Place 1 spray into the nose daily. 1 Inhaler 12    No results found for this or any previous visit (from the past 48 hour(s)). No results found.  Review of Systems  Constitutional: Negative.   Skin:       Mass to the right of the mid-sternum  Neurological: Positive for headaches.  All other systems reviewed and are negative.   Blood pressure 126/69, pulse 77, temperature 97.5 F (36.4 C), temperature source Oral, resp. rate 18, height 5\' 11"  (1.803 m), weight 86.4 kg (190 lb 6 oz), SpO2 97 %. Physical Exam  Constitutional: He is oriented to person, place, and time. He appears well-developed and well-nourished.  HENT:  Head: Normocephalic and atraumatic.  Mouth/Throat: Oropharynx is clear and moist.  Eyes: Conjunctivae and EOM are normal. Pupils are equal, round, and reactive to light.  Neck: Normal range of motion. Neck supple.  Cardiovascular: Normal rate and  regular rhythm.   Respiratory: Effort normal and breath sounds normal.    GI: Soft. Bowel sounds are normal.  Musculoskeletal: Normal range of motion.  Neurological: He is alert and oriented to person, place, and time. He has normal reflexes.  Skin: Skin is dry.  Psychiatric: He has a normal mood and affect. His behavior is normal. Judgment and thought content normal.     Assessment/Plan Chest wall cyst.  Could be associated with previous sternotomy closure wire.  Preop antibiotics, then excision this AM.  Judeth Horn, MD 11/11/2015, 8:45 AM

## 2015-11-11 NOTE — Discharge Instructions (Addendum)
Outpatient Surgery Guidelines, Adult Outpatient procedures are those for which the person having the procedure is allowed to go home the same day as the procedure. Various procedures are done on an outpatient basis. You should follow some general guidelines if you will be having an outpatient procedure. LET [facility] CARE PROVIDER KNOW ABOUT:  Any allergies you have.  All medicines you are taking, including vitamins, herbs, eye drops, creams, and over-the-counter medicines.  Previous problems you or members of your family have had with the use of anesthetics.  Any blood disorders you have.  Previous surgeries you have had.  Medical conditions you have. RISKS AND COMPLICATIONS Your health care provider will discuss possible risks and complications with you before surgery. Common risks and complications include:   Problems due to the use of anesthetics.  Blood loss and replacement (does not apply to minor surgical procedures).  Temporary increase in pain due to surgery.  Uncorrected pain or problems that the surgery was meant to correct.  Infection.  New damage. BEFORE THE PROCEDURE  Ask your health care provider about changing or stopping your regular medicines. You may need to stop taking certain medicines in the days or weeks before the procedure.  Stop smoking at least 2 weeks before surgery. This lowers your risk for complications during and after surgery. Ask your health care provider for help with this if needed.  Eat your usual meals and a light supper the day before surgery. Continue fluid intake. Do not drink alcohol.  Do not eat or drink after midnight the night before your surgery. Take your usual medicine the morning of surgery with a sip of water unless instructed otherwise. Check with your health care provider if you are unsure. This is particularly important if you take diabetes medicine.  Arrange for someone to take you home and to stay with you for 24 hours  after the procedure. Medicine given for your procedure may affect your ability to drive or to care for yourself.  Call your health care provider's office if you develop an illness or problem that may prevent you from safely having your procedure. AFTER THE PROCEDURE After surgery, you will be taken to a recovery area, where your progress will be monitored. If there are no complications, you will be allowed to go home when you are awake, stable, and taking fluids well. You may have numbness around the surgical site. Healing will take some time. You will have tenderness at the surgical site and may have some swelling and bruising. You may also have some nausea. HOME CARE INSTRUCTIONS  Do not drive for 24 hours, or as directed by your health care provider. Do not drive while taking prescription pain medicines.  Do not drink alcohol for 24 hours.  Do not make important decisions or sign legal documents for 24 hours.  You may resume a normal diet and activities as directed.  Do not lift anything heavier than 10 pounds (4.5 kg) or play contact sports until your health care provider says it is okay.  Change your bandages (dressings) as directed.  Only take over-the-counter or prescription medicines as directed by your health care provider.  Follow up with your health care provider as directed.  Leave dressing intact until next appointment SEEK MEDICAL CARE IF:  You have increased bleeding (more than a small spot) from the surgical site.  You have redness, swelling, or increasing pain in the wound.  You see pus coming from the wound.  You have a  fever.  You notice a bad smell coming from the wound or dressing.  You feel lightheaded or faint.  You develop a rash.  You have trouble breathing.  You develop allergies. MAKE SURE YOU:  Understand these instructions.  Will watch your condition.  Will get help right away if you are not doing well or get worse.   This information is  not intended to replace advice given to you by your health care provider. Make sure you discuss any questions you have with your health care provider.   Document Released: 10/21/2000 Document Revised: 06/12/2014 Document Reviewed: 06/30/2012 Elsevier Interactive Patient Education 2016 [place] Anesthesia Home Care Instructions  Activity: Get plenty of rest for the remainder of the day. A responsible adult should stay with you for 24 hours following the procedure.  For the next 24 hours, DO NOT: -Drive a car -Paediatric nurse -Drink alcoholic beverages -Take any medication unless instructed by your physician -Make any legal decisions or sign important papers.  Meals: Start with liquid foods such as gelatin or soup. Progress to regular foods as tolerated. Avoid greasy, spicy, heavy foods. If nausea and/or vomiting occur, drink only clear liquids until the nausea and/or vomiting subsides. Call your physician if vomiting continues.  Special Instructions/Symptoms: Your throat may feel dry or sore from the anesthesia or the breathing tube placed in your throat during surgery. If this causes discomfort, gargle with warm salt water. The discomfort should disappear within 24 hours.  If you had a scopolamine patch placed behind your ear for the management of post- operative nausea and/or vomiting:  1. The medication in the patch is effective for 72 hours, after which it should be removed.  Wrap patch in a tissue and discard in the trash. Wash hands thoroughly with soap and water. 2. You may remove the patch earlier than 72 hours if you experience unpleasant side effects which may include dry mouth, dizziness or visual disturbances. 3. Avoid touching the patch. Wash your hands with soap and water after contact with the patch.

## 2015-11-11 NOTE — Op Note (Signed)
OPERATIVE REPORT  DATE OF OPERATION: 11/11/2015  PATIENT:  Joseph Soto  70 y.o. male  PRE-OPERATIVE DIAGNOSIS:  Symptomatic mid-chest mass  POST-OPERATIVE DIAGNOSIS:  Symptomatic mid-chest mass  INDICATIONS FOR OPERATION:  Symptomatic 3.0 cm mass  FINDINGS:  Reddened, mobile mass in the center of the chest, just to the right of the previous sternotomy incision.  PROCEDURE:  Procedure(s): EXCISION OF MID CHEST  MASS, 3.5 cm, no pus, length of scar 8.0 cm  SURGEON:  Surgeon(s): Judeth Horn, MD  ASSISTANT: None  ANESTHESIA:   local and MAC  COMPLICATIONS:  None  EBL: <20 ml  BLOOD ADMINISTERED: none  DRAINS: none   SPECIMEN:  Source of Specimen:  Excised chest wall mass  COUNTS CORRECT:  YES  PROCEDURE DETAILS: The patient was taken to the operating room and placed on the table in the supine position. After an adequate amount of IV sedation was given, he was prepped and draped in usual sterile manner exposing his chest.  A proper timeout was performed identifying the patient and procedure to be performed. We marked the area of the longitudinal excision in an oval manner. The total length was approximately 8 cm. The width was approximately 3 and half to 4 cm. We made incisions using a #15 blade and using electrocautery to detach the skin and the mass from the underlying subcutaneous tissue but not down through the chest wall fascia. Prior to doing so we injected the area with 1% Xylocaine without epinephrine using a total of approximately 30 mL.  Subcutaneous flaps were made circumferentially then we closed the area after electrocautery was used to obtain adequate hemostasis. Deep stitches of 4-0 Vicryl used to reapproximate the skin than the subcutaneous stitch of 3-0 Monocryl was used to reapproximate the skin. Dermabond Steri-Strips and Tegaderms are used to complete the dressings. All needle counts, sponge counts, and his right counts were correct.  PATIENT DISPOSITION:   PACU - hemodynamically stable.   [name] 10/2/201710:15 AM

## 2015-11-11 NOTE — Transfer of Care (Signed)
Immediate Anesthesia Transfer of Care Note  Patient: Joseph Soto  Procedure(s) Performed: Procedure(s): EXCISION OF MID CHEST  MASS (N/A)  Patient Location: PACU  Anesthesia Type:MAC  Level of Consciousness: awake and patient cooperative  Airway & Oxygen Therapy: Patient Spontanous Breathing and Patient connected to face mask oxygen  Post-op Assessment: Report given to RN and Post -op Vital signs reviewed and stable  Post vital signs: Reviewed and stable  Last Vitals:  Vitals:   11/11/15 0842  BP: 126/69  Pulse: 77  Resp: 18  Temp: 36.4 C    Last Pain:  Vitals:   11/11/15 0842  TempSrc: Oral         Complications: No apparent anesthesia complications

## 2015-11-11 NOTE — Anesthesia Preprocedure Evaluation (Signed)
Anesthesia Evaluation  Patient identified by MRN, date of birth, ID band Patient awake    Reviewed: Allergy & Precautions, NPO status , Patient's Chart, lab work & pertinent test results  Airway        Dental   Pulmonary former smoker,           Cardiovascular + CAD, + Past MI and + CABG    The estimated ejection fraction was in the range of 55% to 60%. Mild inferior hypokinesis. Doppler parameters are consistent with abnormal left ventricular relaxation (grade 1 diastolic dysfunction)   Neuro/Psych negative neurological ROS  negative psych ROS   GI/Hepatic negative GI ROS, Neg liver ROS,   Endo/Other  negative endocrine ROS  Renal/GU negative Renal ROS     Musculoskeletal negative musculoskeletal ROS (+) Fibromyalgia -  Abdominal   Peds  Hematology  (+) Blood dyscrasia (non-hodgkin's lymphoma), ,   Anesthesia Other Findings   Reproductive/Obstetrics                             Anesthesia Physical Anesthesia Plan  ASA: III  Anesthesia Plan: MAC   Post-op Pain Management:    Induction: Intravenous  Airway Management Planned: Simple Face Mask  Additional Equipment:   Intra-op Plan:   Post-operative Plan: Extubation in OR  Informed Consent: I have reviewed the patients History and Physical, chart, labs and discussed the procedure including the risks, benefits and alternatives for the proposed anesthesia with the patient or authorized representative who has indicated his/her understanding and acceptance.   Dental advisory given  Plan Discussed with: CRNA and Surgeon  Anesthesia Plan Comments:         Anesthesia Quick Evaluation

## 2015-11-11 NOTE — Anesthesia Postprocedure Evaluation (Signed)
Anesthesia Post Note  Patient: Joseph Soto  Procedure(s) Performed: Procedure(s) (LRB): EXCISION OF MID CHEST  MASS (N/A)  Patient location during evaluation: PACU Anesthesia Type: MAC Level of consciousness: awake and alert Pain management: pain level controlled Vital Signs Assessment: post-procedure vital signs reviewed and stable Respiratory status: spontaneous breathing, nonlabored ventilation, respiratory function stable and patient connected to nasal cannula oxygen Cardiovascular status: stable and blood pressure returned to baseline Anesthetic complications: no    Last Vitals:  Vitals:   11/11/15 1030 11/11/15 1045  BP: 134/72   Pulse: 76 69  Resp: 17   Temp:      Last Pain:  Vitals:   11/11/15 1054  TempSrc:   PainSc: [name]

## 2015-11-11 NOTE — Anesthesia Preprocedure Evaluation (Signed)
Anesthesia Evaluation    Airway Mallampati: II  TM Distance: >3 FB Neck ROM: Full    Dental no notable dental hx.    Pulmonary former smoker,  o2 dependent at night   Pulmonary exam normal        Cardiovascular + CAD, + Past MI and + CABG  Normal cardiovascular exam     Neuro/Psych    GI/Hepatic   Endo/Other    Renal/GU      Musculoskeletal  (+) Fibromyalgia -  Abdominal   Peds  Hematology   Anesthesia Other Findings   Reproductive/Obstetrics                            Anesthesia Physical Anesthesia Plan  ASA: III  Anesthesia Plan: MAC   Post-op Pain Management:    Induction: Intravenous  Airway Management Planned: Simple Face Mask  Additional Equipment:   Intra-op Plan:   Post-operative Plan:   Informed Consent: I have reviewed the patients History and Physical, chart, labs and discussed the procedure including the risks, benefits and alternatives for the proposed anesthesia with the patient or authorized representative who has indicated his/her understanding and acceptance.   Dental advisory given  Plan Discussed with:   Anesthesia Plan Comments:        Anesthesia Quick Evaluation

## 2015-11-13 ENCOUNTER — Encounter (HOSPITAL_BASED_OUTPATIENT_CLINIC_OR_DEPARTMENT_OTHER): Payer: Self-pay | Admitting: General Surgery

## 2015-11-14 DIAGNOSIS — R0902 Hypoxemia: Secondary | ICD-10-CM | POA: Diagnosis not present

## 2015-11-29 ENCOUNTER — Ambulatory Visit: Payer: Self-pay | Admitting: General Surgery

## 2015-11-29 DIAGNOSIS — R69 Illness, unspecified: Secondary | ICD-10-CM | POA: Diagnosis not present

## 2015-12-02 ENCOUNTER — Encounter: Payer: Self-pay | Admitting: Neurology

## 2015-12-02 ENCOUNTER — Other Ambulatory Visit: Payer: Self-pay | Admitting: *Deleted

## 2015-12-02 ENCOUNTER — Ambulatory Visit (INDEPENDENT_AMBULATORY_CARE_PROVIDER_SITE_OTHER): Payer: Medicare HMO | Admitting: Neurology

## 2015-12-02 VITALS — BP 120/72 | HR 74 | Ht 71.0 in | Wt 191.5 lb

## 2015-12-02 DIAGNOSIS — I25810 Atherosclerosis of coronary artery bypass graft(s) without angina pectoris: Secondary | ICD-10-CM | POA: Diagnosis not present

## 2015-12-02 DIAGNOSIS — E785 Hyperlipidemia, unspecified: Secondary | ICD-10-CM | POA: Diagnosis not present

## 2015-12-02 DIAGNOSIS — I255 Ischemic cardiomyopathy: Secondary | ICD-10-CM | POA: Diagnosis not present

## 2015-12-02 DIAGNOSIS — R519 Headache, unspecified: Secondary | ICD-10-CM | POA: Insufficient documentation

## 2015-12-02 DIAGNOSIS — M545 Low back pain, unspecified: Secondary | ICD-10-CM | POA: Insufficient documentation

## 2015-12-02 DIAGNOSIS — G44221 Chronic tension-type headache, intractable: Secondary | ICD-10-CM

## 2015-12-02 DIAGNOSIS — G8929 Other chronic pain: Secondary | ICD-10-CM

## 2015-12-02 DIAGNOSIS — M5442 Lumbago with sciatica, left side: Secondary | ICD-10-CM

## 2015-12-02 DIAGNOSIS — R51 Headache: Secondary | ICD-10-CM

## 2015-12-02 MED ORDER — DULOXETINE HCL 60 MG PO CPEP: 60.0000 mg | ORAL_CAPSULE | Freq: Every day | ORAL | 11 refills | Status: DC

## 2015-12-02 NOTE — Progress Notes (Signed)
PATIENT: Joseph Soto DOB: 05-10-1945  Chief Complaint  Patient presents with  . Dizziness    States Vertigo is only a problem for him about twice per year.  Marland Kitchen Headache    Head pains started ten years ago and he initially thought it was related to allergies because it would come and go with seasonal changes.  For the last 4-5 years, he has had constant pain.   . Pain in buttock    He has been having intermittent pain in his left buttock for the last 15-20 years.  Denies back pain or leg pain.     HISTORICAL  [name] Is a 70 years old left-handed male,seen in refer by his primary care doctor  [name] for evaluation of chronic low back pain, head pressure, initial evaluation was on December 02 2015 70  He had a history of coronary artery disease, status post bypass in 2007, hyperlipidemia, per patient, there was incidental finding of non-Hodgkin's lymphoma, but never required treatment. He was also recently treated for chest wall mass along previous bypass scar, had mass resection on November 11 2015, I personally reviewed pathology report, 3 cm mass, consistent with leiomyosarcoma  He reported a history of chronic left buttock area pain for more than 20 years, he described transient sharp pain, intermixed with deep achy pain, he denies radiating pain to distal left leg, no significant gait abnormality.  Over the years, he was treated with pelvic floor muscle arch, acupuncture, chiropractor, massage therapist, dry needling with limited help, he has been taking Flexeril 10 mg twice a day, gabapentin 300 mg 2 tablets twice a day, which has been helpful, but he complains of side effect of drowsiness, dry mouth.  Also had a history of allergic symptoms used to be around spring, gets better at the winter months, but over the past few years, he has year-round symptoms, he described squeezing sensation along his skull  He is physically active, playing golf all regularly, he was  previously evaluated by sleep specialist Dr. Brett Fairy, 70 sleep study in December 2015 reviewed, evidence of persistent hypoxemia, lowest desaturation 83%, no obstructive sleep apnea, there was evidence of periodic limb movement disorder,I reviewed the palmar functional test in December 2015, FVC 6.05,  136% FVC/ pre  I personally reviewed MRI brain in 2014, no acute intracranial abnormality, evidence of chronic bilateral maxillary sinusitis.  REVIEW OF SYSTEMS: Full 14 system review of systems performed and notable only for fatigue, ringing ears, trouble swallowing, loss of vision, shortness of breath, excessive thirst, joint pain, memory loss, numbness, depression anxiety  ALLERGIES: Allergies  Allergen Reactions  . Niaspan [Niacin Er]     Stomach intolerance    HOME MEDICATIONS: Current Outpatient Prescriptions  Medication Sig Dispense Refill  . cholecalciferol (VITAMIN D) 400 UNITS TABS Take 400 Units by mouth daily.    Marland Kitchen co-enzyme Q-10 30 MG capsule Take 100 mg by mouth daily.     . cyclobenzaprine (FLEXERIL) 10 MG tablet Take 10 mg by mouth 2 (two) times daily. (MUSCLE SPASMS)  2  . fish oil-omega-3 fatty acids 1000 MG capsule Take 2 g by mouth daily.      Marland Kitchen gabapentin (NEURONTIN) 300 MG capsule Take 600 mg by mouth 2 (two) times daily.  12  . metoprolol tartrate (LOPRESSOR) 25 MG tablet TAKE 0.5 TABLETS (12.5 MG TOTAL) BY MOUTH 2 (TWO) TIMES DAILY. 90 tablet 2  . Multiple Vitamin (MULTIVITAMIN) capsule Take 1 capsule by mouth daily.      Marland Kitchen  nitroGLYCERIN (NITROSTAT) 0.4 MG SL tablet Place 1 tablet (0.4 mg total) under the tongue every 5 (five) minutes as needed for chest pain. 25 tablet 6  . ondansetron (ZOFRAN ODT) 4 MG disintegrating tablet '4mg'$  ODT q4 hours prn nausea/vomit 4 tablet 0  . rosuvastatin (CRESTOR) 40 MG tablet TAKE 1 TABLET BY MOUTH EVERY DAY 90 tablet 2  . traMADol (ULTRAM) 50 MG tablet Take 1-2 tablets (50-100 mg total) by mouth every 6 (six) hours as needed for moderate  pain or severe pain. 40 tablet 0  . triamcinolone (NASACORT) 55 MCG/ACT AERO nasal inhaler Place 1 spray into the nose daily. 1 Inhaler 12   No current facility-administered medications for this visit.     PAST MEDICAL HISTORY: Past Medical History:  Diagnosis Date  . Buttock pain   . CAD (coronary artery disease)    Last cath 2010 per Dr. Doreatha Lew with diffuse multivessel disease, small caliber vessels not felt to be suitable for PCI  . Dizziness    1 episode   . Fibromyalgia   . Head pain   . HLD (hyperlipidemia)   . Myocardial infarction   . Non Hodgkin's lymphoma ([place])     PAST SURGICAL HISTORY: Past Surgical History:  Procedure Laterality Date  . Baker cyst removal    . CHOLECYSTECTOMY    . CORONARY ARTERY BYPASS GRAFT  5/07   x5. LV dysfunction.   Marland Kitchen MASS EXCISION N/A 11/11/2015   Procedure: EXCISION OF MID CHEST  MASS;  Surgeon: Judeth Horn, MD;  Location: [facility];  Service: General;  Laterality: N/A;  . TONSILLECTOMY      FAMILY HISTORY: Family History  Problem Relation Age of Onset  . Heart disease Father   . Heart attack Father   . Dementia Mother     SOCIAL HISTORY:  Social History   Social History  . Marital status: Married    Spouse name: [name]  . Number of children: 2  . Years of education: college   Occupational History  . Camera operator    Social History Main Topics  . Smoking status: Former Research scientist (life sciences)  . Smokeless tobacco: Never Used     Comment: quit in 2007   . Alcohol use No  . Drug use: No  . Sexual activity: Not on file   Other Topics Concern  . Not on file   Social History Narrative   Married, 2 children.   Runs an executive recruiting company.    Left-handed.   Drinks a bottle of green tea.     PHYSICAL EXAM   Vitals:   12/02/15 1554  BP: 120/72  Pulse: 74  Weight: 191 lb 8 oz (86.9 kg)  Height: '5\' 11"'$  (1.803 m)    Not recorded      Body mass index is 26.71 kg/m.  PHYSICAL  EXAMNIATION:  Gen: NAD, conversant, well nourised, obese, well groomed                     Cardiovascular: Regular rate rhythm, no peripheral edema, warm, nontender. Eyes: Conjunctivae clear without exudates or hemorrhage Neck: Supple, no carotid bruits. Pulmonary: Clear to auscultation bilaterally   NEUROLOGICAL EXAM:  MENTAL STATUS: Speech:    Speech is normal; fluent and spontaneous with normal comprehension.  Cognition:     Orientation to time, place and person     Normal recent and remote memory     Normal Attention span and concentration     Normal Language,  naming, repeating,spontaneous speech     Fund of knowledge   CRANIAL NERVES: CN II: Visual fields are full to confrontation. Fundoscopic exam is normal with sharp discs and no vascular changes. Pupils are round equal and briskly reactive to light. CN III, IV, VI: extraocular movement are normal. No ptosis. CN V: Facial sensation is intact to pinprick in all 3 divisions bilaterally. Corneal responses are intact.  CN VII: Face is symmetric with normal eye closure and smile. CN VIII: Hearing is normal to rubbing fingers CN IX, X: Palate elevates symmetrically. Phonation is normal. CN XI: Head turning and shoulder shrug are intact CN XII: Tongue is midline with normal movements and no atrophy.  MOTOR: There is no pronator drift of out-stretched arms. Muscle bulk and tone are normal. Muscle strength is normal.  REFLEXES: Reflexes are 2+ and symmetric at the biceps, triceps, knees, and ankles. Plantar responses are flexor.  SENSORY: Intact to light touch, pinprick, positional sensation and vibratory sensation are intact in fingers and toes.  COORDINATION: Rapid alternating movements and fine finger movements are intact. There is no dysmetria on finger-to-nose and heel-knee-shin.    GAIT/STANCE: Posture is normal. Gait is steady with normal steps, base, arm swing, and turning. Heel and toe walking are normal. Tandem gait  is normal.  Romberg is absent.   DIAGNOSTIC DATA (LABS, IMAGING, TESTING) - I reviewed patient records, labs, notes, testing and imaging myself where available.   ASSESSMENT AND PLAN  JERRAL MCCAULEY is a 70 y.o. male   Chronic low back pain, radiating pain to left hip, Need to rule out lumbosacral radiculopathy, MRI of lumbar, EMG nerve conduction study  Chronic tension headaches ESR C-reactive protein, TSH Start Cymbalta 60 mg every morning, tapering off Flexeril, decrease gabapentin to 300 mg 2 tablets every night to avoid the side effect    Marcial Pacas, M.D. Ph.D.  [facility] [address] [place], [place] [address] Ph: [phone] Fax: [phone]  CC: [name], MD

## 2015-12-03 ENCOUNTER — Other Ambulatory Visit (INDEPENDENT_AMBULATORY_CARE_PROVIDER_SITE_OTHER): Payer: Self-pay

## 2015-12-03 DIAGNOSIS — G8929 Other chronic pain: Secondary | ICD-10-CM | POA: Diagnosis not present

## 2015-12-03 DIAGNOSIS — I255 Ischemic cardiomyopathy: Secondary | ICD-10-CM | POA: Diagnosis not present

## 2015-12-03 DIAGNOSIS — E785 Hyperlipidemia, unspecified: Secondary | ICD-10-CM | POA: Diagnosis not present

## 2015-12-03 DIAGNOSIS — I25810 Atherosclerosis of coronary artery bypass graft(s) without angina pectoris: Secondary | ICD-10-CM | POA: Diagnosis not present

## 2015-12-03 DIAGNOSIS — Z0289 Encounter for other administrative examinations: Secondary | ICD-10-CM

## 2015-12-03 DIAGNOSIS — M5442 Lumbago with sciatica, left side: Secondary | ICD-10-CM | POA: Diagnosis not present

## 2015-12-03 DIAGNOSIS — G44221 Chronic tension-type headache, intractable: Secondary | ICD-10-CM | POA: Diagnosis not present

## 2015-12-04 ENCOUNTER — Encounter: Payer: Self-pay | Admitting: Neurology

## 2015-12-04 LAB — VITAMIN D 25 HYDROXY (VIT D DEFICIENCY, FRACTURES): Vit D, 25-Hydroxy: 40.5 ng/mL (ref 30.0–100.0)

## 2015-12-04 LAB — SEDIMENTATION RATE: Sed Rate: 3 mm/hr (ref 0–30)

## 2015-12-04 LAB — ANA W/REFLEX: Anti Nuclear Antibody(ANA): NEGATIVE

## 2015-12-04 LAB — C-REACTIVE PROTEIN: CRP: 2.4 mg/L (ref 0.0–4.9)

## 2015-12-04 LAB — RHEUMATOID FACTOR: Rhuematoid fact SerPl-aCnc: <10 IU/mL (ref 0.0–13.9)

## 2015-12-04 LAB — VITAMIN B12: Vitamin B-12: 635 pg/mL (ref 211–946)

## 2015-12-04 LAB — TSH: TSH: 3.15 u[IU]/mL (ref 0.450–4.500)

## 2015-12-09 ENCOUNTER — Telehealth: Payer: Self-pay | Admitting: Oncology

## 2015-12-09 NOTE — Telephone Encounter (Signed)
Appt scheduled w/[facility] for 11/9 at 2pm. Made the pt aware to arrive 15-30 minutes early. Demographics verified. Letter mailed to the pt.

## 2015-12-15 DIAGNOSIS — R0902 Hypoxemia: Secondary | ICD-10-CM | POA: Diagnosis not present

## 2015-12-17 ENCOUNTER — Encounter (HOSPITAL_BASED_OUTPATIENT_CLINIC_OR_DEPARTMENT_OTHER): Payer: Self-pay | Admitting: *Deleted

## 2015-12-18 ENCOUNTER — Encounter: Payer: Self-pay | Admitting: Neurology

## 2015-12-18 ENCOUNTER — Other Ambulatory Visit: Payer: Medicare HMO

## 2015-12-19 ENCOUNTER — Telehealth: Payer: Self-pay | Admitting: Oncology

## 2015-12-19 ENCOUNTER — Ambulatory Visit (HOSPITAL_BASED_OUTPATIENT_CLINIC_OR_DEPARTMENT_OTHER): Payer: Medicare HMO | Admitting: Oncology

## 2015-12-19 ENCOUNTER — Ambulatory Visit: Payer: Medicare HMO | Admitting: Oncology

## 2015-12-19 VITALS — BP 147/68 | HR 89 | Temp 97.7°F | Resp 17 | Ht 71.0 in | Wt 191.0 lb

## 2015-12-19 DIAGNOSIS — C493 Malignant neoplasm of connective and soft tissue of thorax: Secondary | ICD-10-CM

## 2015-12-19 DIAGNOSIS — Z8579 Personal history of other malignant neoplasms of lymphoid, hematopoietic and related tissues: Secondary | ICD-10-CM | POA: Diagnosis not present

## 2015-12-19 DIAGNOSIS — Z809 Family history of malignant neoplasm, unspecified: Secondary | ICD-10-CM

## 2015-12-19 DIAGNOSIS — Z87891 Personal history of nicotine dependence: Secondary | ICD-10-CM | POA: Diagnosis not present

## 2015-12-19 DIAGNOSIS — E785 Hyperlipidemia, unspecified: Secondary | ICD-10-CM | POA: Diagnosis not present

## 2015-12-19 NOTE — Telephone Encounter (Signed)
Gave patient avs report and appointments for May  °

## 2015-12-19 NOTE — Progress Notes (Signed)
Joseph Soto New Patient Consult   Referring MD: Resean Kapple Pendry 70 y.o.  10-06-1945    Reason for Referral: Chest wall leiomyosarcoma   HPI: Joseph Soto has a remote history of low-grade non-Hodgkin's lymphoma and was last seen at the [facility] in 2013. He reports noting a growth at the coronary artery bypass scar for several years. The CABG scar begin to retract laterally. He was referred to Dr. Hulen Skains. He was taken to the operating room on 11/11/2015 for excision of the soft tissue mass. The mass was measured at approximately 3. 5-4 centimeters. The pathology [id number] confirmed a leiomyosarcoma. Tumor was focally present at the lateral margin. The deep margin was negative. No lymphovascular or perineural invasion. The tumor measured 2 cm x 0.6 cm. The tumor involved the superficial subcutaneous tissue. Up to 5 mitoses were identified per 10 high-powered fields. No necrosis.  He is scheduled for a reexcision procedure next week.  He currently has an upper respiratory infection. He otherwise feels well. He is undergoing evaluation by neurology for chronic buttock pain that is worse with physical activity. He also has a chronic "squeezing "discomfort around the face and head.  Past medical history: 1. History of coronary artery disease, myocardial infarction in 2007 2. 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 Fibromyalgia 4. Hyperlipidemia  Past Surgical History:  Procedure Laterality Date  . Baker cyst removal    . CHOLECYSTECTOMY    . CORONARY ARTERY BYPASS GRAFT  5/07   x5. LV dysfunction.   Marland Kitchen MASS EXCISION N/A 11/11/2015   Procedure: EXCISION OF MID CHEST  MASS;  Surgeon: Judeth Horn, MD;  Location: [facility];  Service: General;  Laterality: N/A;  . TONSILLECTOMY      Medications: Reviewed  Allergies:  Allergies  Allergen Reactions  . Niaspan [Niacin Er]     Stomach intolerance     Family history: His paternal grandfather died of "cancer ". No other family history of cancer.  Social History:   He lives in [place]. He is retired from a business occupation. He quit smoking cigarettes in 2007. No alcohol use. No transfusion history.    ROS:   Positives include: "Squeezing" discomfort of the head for years, buttock pain for years that is worse with exercise, right cataract, current upper respiratory infection  A complete ROS was otherwise negative.  Physical Exam:  Blood pressure (!) 147/68, pulse 89, temperature 97.7 F (36.5 C), temperature source Oral, resp. rate 17, height 5\' 11"  (1.803 m), weight 191 lb (86.6 kg), SpO2 96 %.  HEENT: No sinus tenderness, neck without mass Lungs: Clear bilaterally, no respiratory distress Cardiac: Regular rate and rhythm Abdomen: No hepatosplenomegaly, no mass, nontender GU: Testes without mass  Vascular: No leg edema Lymph nodes: No cervical, supraclavicular, or inguinal nodes. Prominent bilateral axillary fat pads versus soft mobile less than 1 cm nodes Neurologic: Alert and oriented, the motor exam appears intact in the upper and lower extremities Skin: No rash, anterior chest coronary artery bypass scar with a new scar near the mid point. No palpable nodularity. Musculoskeletal: No spine tenderness   LAB:  CBC  Lab Results  Component Value Date   WBC 8.1 11/07/2015   HGB 12.8 (L) 11/07/2015   HCT 39.2 11/07/2015   MCV 89.3 11/07/2015   PLT 242 11/07/2015   NEUTROABS 5.4 11/07/2015       Imaging:  Chest x-ray 11/07/2015-no acute pulmonary disease  Assessment/Plan:  1. Leiomyosarcoma of the anterior chest wall, status post an excisional biopsy on 11/11/2015 confirming a leiomyosarcoma  Positive lateral surgical margin, 2 x 0.6 cm, up to 5 mitoses per 10 high-powered fields  2.   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   History of coronary artery  disease/myocardial infarction-status post coronary artery bypass surgery in 2007  4.   Hyperlipidemia   Disposition:   Joseph Soto has been diagnosed with leiomyosarcoma involving an anterior chest wall mass. The lesion has been excised with a positive surgical margin. He is scheduled for a reexcision procedure next week. He does not appear to have a high-grade sarcoma. I do not recommend adjuvant therapy if a negative surgical margin is obtained. We will recommend adjuvant radiation if there is a positive margin on the reexcision.  He will return for an office visit in 6 months. I will see him sooner as needed.  There is no clinical evidence for progression of the follicular lymphoma.  Approximately 50 minutes were spent with the patient today. The majority of the time was used for counseling and coordination of care.     Betsy Coder, MD  12/19/2015, 3:34 PM

## 2015-12-20 ENCOUNTER — Encounter (HOSPITAL_BASED_OUTPATIENT_CLINIC_OR_DEPARTMENT_OTHER)
Admission: RE | Admit: 2015-12-20 | Discharge: 2015-12-20 | Disposition: A | Discharge status: Home / Self Care | Payer: Medicare HMO | Source: Ambulatory Visit | Attending: General Surgery | Admitting: General Surgery

## 2015-12-20 DIAGNOSIS — I252 Old myocardial infarction: Secondary | ICD-10-CM | POA: Diagnosis not present

## 2015-12-20 DIAGNOSIS — Z8572 Personal history of non-Hodgkin lymphomas: Secondary | ICD-10-CM | POA: Diagnosis not present

## 2015-12-20 DIAGNOSIS — E785 Hyperlipidemia, unspecified: Secondary | ICD-10-CM | POA: Diagnosis not present

## 2015-12-20 DIAGNOSIS — Z87891 Personal history of nicotine dependence: Secondary | ICD-10-CM | POA: Diagnosis not present

## 2015-12-20 DIAGNOSIS — C44509 Unspecified malignant neoplasm of skin of other part of trunk: Secondary | ICD-10-CM | POA: Diagnosis present

## 2015-12-20 DIAGNOSIS — I10 Essential (primary) hypertension: Secondary | ICD-10-CM | POA: Diagnosis not present

## 2015-12-20 DIAGNOSIS — M797 Fibromyalgia: Secondary | ICD-10-CM | POA: Diagnosis not present

## 2015-12-20 DIAGNOSIS — I2510 Atherosclerotic heart disease of native coronary artery without angina pectoris: Secondary | ICD-10-CM | POA: Diagnosis not present

## 2015-12-20 DIAGNOSIS — D235 Other benign neoplasm of skin of trunk: Secondary | ICD-10-CM | POA: Diagnosis not present

## 2015-12-20 DIAGNOSIS — Z951 Presence of aortocoronary bypass graft: Secondary | ICD-10-CM | POA: Diagnosis not present

## 2015-12-20 DIAGNOSIS — Z7982 Long term (current) use of aspirin: Secondary | ICD-10-CM | POA: Diagnosis not present

## 2015-12-20 LAB — BASIC METABOLIC PANEL
Anion gap: 9 (ref 5–15)
BUN: 16 mg/dL (ref 6–20)
CO2: 25 mmol/L (ref 22–32)
Calcium: 9 mg/dL (ref 8.9–10.3)
Chloride: 105 mmol/L (ref 101–111)
Creatinine, Ser: 1.15 mg/dL (ref 0.61–1.24)
GFR calc Af Amer: >60 mL/min (ref >60)
GFR calc non Af Amer: >60 mL/min (ref >60)
Glucose, Bld: 95 mg/dL (ref 65–99)
Potassium: 4.4 mmol/L (ref 3.5–5.1)
Sodium: 139 mmol/L (ref 135–145)

## 2015-12-20 LAB — CBC WITH DIFFERENTIAL/PLATELET
Basophils Absolute: 0.1 10*3/uL (ref 0.0–0.1)
Basophils Relative: 1 %
Eosinophils Absolute: 0.5 10*3/uL (ref 0.0–0.7)
Eosinophils Relative: 4 %
HCT: 39.2 % (ref 39.0–52.0)
Hemoglobin: 13.4 g/dL (ref 13.0–17.0)
Lymphocytes Relative: 17 %
Lymphs Abs: 2.1 10*3/uL (ref 0.7–4.0)
MCH: 30 pg (ref 26.0–34.0)
MCHC: 34.2 g/dL (ref 30.0–36.0)
MCV: 87.9 fL (ref 78.0–100.0)
Monocytes Absolute: 0.9 10*3/uL (ref 0.1–1.0)
Monocytes Relative: 8 %
Neutro Abs: 8.4 10*3/uL — ABNORMAL HIGH (ref 1.7–7.7)
Neutrophils Relative %: 70 %
Platelets: 292 10*3/uL (ref 150–400)
RBC: 4.46 MIL/uL (ref 4.22–5.81)
RDW: 14 % (ref 11.5–15.5)
WBC: 11.9 10*3/uL — ABNORMAL HIGH (ref 4.0–10.5)

## 2015-12-23 ENCOUNTER — Ambulatory Visit (HOSPITAL_BASED_OUTPATIENT_CLINIC_OR_DEPARTMENT_OTHER): Payer: Medicare HMO | Admitting: Anesthesiology

## 2015-12-23 ENCOUNTER — Encounter (HOSPITAL_BASED_OUTPATIENT_CLINIC_OR_DEPARTMENT_OTHER)
Admission: RE | Disposition: A | Discharge status: Home / Self Care | Payer: Self-pay | Source: Ambulatory Visit | Attending: General Surgery

## 2015-12-23 ENCOUNTER — Encounter (HOSPITAL_BASED_OUTPATIENT_CLINIC_OR_DEPARTMENT_OTHER): Payer: Self-pay | Admitting: Anesthesiology

## 2015-12-23 ENCOUNTER — Ambulatory Visit (HOSPITAL_BASED_OUTPATIENT_CLINIC_OR_DEPARTMENT_OTHER)
Admission: RE | Admit: 2015-12-23 | Discharge: 2015-12-23 | Disposition: A | Discharge status: Home / Self Care | Payer: Medicare HMO | Source: Ambulatory Visit | Attending: General Surgery | Admitting: General Surgery

## 2015-12-23 DIAGNOSIS — E785 Hyperlipidemia, unspecified: Secondary | ICD-10-CM | POA: Insufficient documentation

## 2015-12-23 DIAGNOSIS — Z8572 Personal history of non-Hodgkin lymphomas: Secondary | ICD-10-CM | POA: Insufficient documentation

## 2015-12-23 DIAGNOSIS — M797 Fibromyalgia: Secondary | ICD-10-CM | POA: Insufficient documentation

## 2015-12-23 DIAGNOSIS — Z7982 Long term (current) use of aspirin: Secondary | ICD-10-CM | POA: Diagnosis not present

## 2015-12-23 DIAGNOSIS — Z87891 Personal history of nicotine dependence: Secondary | ICD-10-CM | POA: Insufficient documentation

## 2015-12-23 DIAGNOSIS — I10 Essential (primary) hypertension: Secondary | ICD-10-CM | POA: Insufficient documentation

## 2015-12-23 DIAGNOSIS — I2510 Atherosclerotic heart disease of native coronary artery without angina pectoris: Secondary | ICD-10-CM | POA: Insufficient documentation

## 2015-12-23 DIAGNOSIS — D235 Other benign neoplasm of skin of trunk: Secondary | ICD-10-CM | POA: Diagnosis not present

## 2015-12-23 DIAGNOSIS — I255 Ischemic cardiomyopathy: Secondary | ICD-10-CM | POA: Diagnosis not present

## 2015-12-23 DIAGNOSIS — C493 Malignant neoplasm of connective and soft tissue of thorax: Secondary | ICD-10-CM | POA: Diagnosis not present

## 2015-12-23 DIAGNOSIS — I252 Old myocardial infarction: Secondary | ICD-10-CM | POA: Insufficient documentation

## 2015-12-23 DIAGNOSIS — Z951 Presence of aortocoronary bypass graft: Secondary | ICD-10-CM | POA: Diagnosis not present

## 2015-12-23 DIAGNOSIS — Z85831 Personal history of malignant neoplasm of soft tissue: Secondary | ICD-10-CM | POA: Diagnosis not present

## 2015-12-23 HISTORY — PX: MASS EXCISION: SHX2000

## 2015-12-23 HISTORY — DX: Essential (primary) hypertension: I10

## 2015-12-23 LAB — POCT I-STAT, CHEM 8
BUN: 23 mg/dL — ABNORMAL HIGH (ref 6–20)
Calcium, Ion: 1.12 mmol/L — ABNORMAL LOW (ref 1.15–1.40)
Chloride: 105 mmol/L (ref 101–111)
Creatinine, Ser: 0.9 mg/dL (ref 0.61–1.24)
Glucose, Bld: 101 mg/dL — ABNORMAL HIGH (ref 65–99)
HCT: 40 % (ref 39.0–52.0)
Hemoglobin: 13.6 g/dL (ref 13.0–17.0)
Potassium: 4.1 mmol/L (ref 3.5–5.1)
Sodium: 139 mmol/L (ref 135–145)
TCO2: 22 mmol/L (ref 0–100)

## 2015-12-23 SURGERY — EXCISION MASS
Anesthesia: General | Site: Chest

## 2015-12-23 MED ORDER — ACETAMINOPHEN 500 MG PO TABS
1000.0000 mg | ORAL_TABLET | ORAL | Status: AC
  Administered 2015-12-23: 1000 mg via ORAL

## 2015-12-23 MED ORDER — FENTANYL CITRATE (PF) 100 MCG/2ML IJ SOLN
INTRAMUSCULAR | Status: AC
  Filled 2015-12-23: qty 2

## 2015-12-23 MED ORDER — PHENYLEPHRINE HCL 10 MG/ML IJ SOLN
INTRAMUSCULAR | Status: DC | PRN
  Administered 2015-12-23 (×2): 80 ug via INTRAVENOUS

## 2015-12-23 MED ORDER — MIDAZOLAM HCL 2 MG/2ML IJ SOLN
INTRAMUSCULAR | Status: AC
  Filled 2015-12-23: qty 2

## 2015-12-23 MED ORDER — SCOPOLAMINE 1 MG/3DAYS TD PT72: 1.0000 | MEDICATED_PATCH | Freq: Once | TRANSDERMAL | Status: DC | PRN

## 2015-12-23 MED ORDER — LIDOCAINE-EPINEPHRINE 1 %-1:100000 IJ SOLN
INTRAMUSCULAR | Status: AC
  Filled 2015-12-23: qty 1

## 2015-12-23 MED ORDER — LACTATED RINGERS IV SOLN
INTRAVENOUS | Status: DC
  Administered 2015-12-23 (×2): via INTRAVENOUS

## 2015-12-23 MED ORDER — CEFAZOLIN SODIUM-DEXTROSE 2-4 GM/100ML-% IV SOLN
2.0000 g | INTRAVENOUS | Status: AC
  Administered 2015-12-23: 2 g via INTRAVENOUS

## 2015-12-23 MED ORDER — SODIUM CHLORIDE 0.9 % IJ SOLN
INTRAMUSCULAR | Status: AC
  Filled 2015-12-23: qty 10

## 2015-12-23 MED ORDER — DEXAMETHASONE SODIUM PHOSPHATE 10 MG/ML IJ SOLN
INTRAMUSCULAR | Status: AC
  Filled 2015-12-23: qty 1

## 2015-12-23 MED ORDER — SODIUM BICARBONATE 4 % IV SOLN
INTRAVENOUS | Status: AC
  Filled 2015-12-23: qty 5

## 2015-12-23 MED ORDER — LIDOCAINE HCL (CARDIAC) 20 MG/ML IV SOLN
INTRAVENOUS | Status: DC | PRN
  Administered 2015-12-23: 60 mg via INTRAVENOUS

## 2015-12-23 MED ORDER — LIDOCAINE HCL (PF) 1 % IJ SOLN
INTRAMUSCULAR | Status: AC
  Filled 2015-12-23: qty 30

## 2015-12-23 MED ORDER — DEXAMETHASONE SODIUM PHOSPHATE 4 MG/ML IJ SOLN
INTRAMUSCULAR | Status: DC | PRN
  Administered 2015-12-23: 10 mg via INTRAVENOUS

## 2015-12-23 MED ORDER — CHLORHEXIDINE GLUCONATE CLOTH 2 % EX PADS: 6.0000 | MEDICATED_PAD | Freq: Once | CUTANEOUS | Status: DC

## 2015-12-23 MED ORDER — BUPIVACAINE HCL (PF) 0.25 % IJ SOLN
INTRAMUSCULAR | Status: AC
  Filled 2015-12-23: qty 180

## 2015-12-23 MED ORDER — ONDANSETRON HCL 4 MG/2ML IJ SOLN
INTRAMUSCULAR | Status: AC
  Filled 2015-12-23: qty 2

## 2015-12-23 MED ORDER — TRAMADOL HCL 50 MG PO TABS: 50.0000 mg | ORAL_TABLET | Freq: Four times a day (QID) | ORAL | 0 refills | Status: DC | PRN

## 2015-12-23 MED ORDER — ACETAMINOPHEN 500 MG PO TABS
ORAL_TABLET | ORAL | Status: AC
  Filled 2015-12-23: qty 2

## 2015-12-23 MED ORDER — METHYLENE BLUE 0.5 % INJ SOLN
INTRAVENOUS | Status: AC
  Filled 2015-12-23: qty 10

## 2015-12-23 MED ORDER — PROPOFOL 10 MG/ML IV BOLUS
INTRAVENOUS | Status: DC | PRN
  Administered 2015-12-23: 150 mg via INTRAVENOUS

## 2015-12-23 MED ORDER — CEFAZOLIN SODIUM-DEXTROSE 2-4 GM/100ML-% IV SOLN
INTRAVENOUS | Status: AC
  Filled 2015-12-23: qty 100

## 2015-12-23 MED ORDER — BUPIVACAINE HCL (PF) 0.25 % IJ SOLN
INTRAMUSCULAR | Status: DC | PRN
  Administered 2015-12-23: 20 mL

## 2015-12-23 MED ORDER — PROPOFOL 500 MG/50ML IV EMUL
INTRAVENOUS | Status: AC
  Filled 2015-12-23: qty 50

## 2015-12-23 MED ORDER — FENTANYL CITRATE (PF) 100 MCG/2ML IJ SOLN: 25.0000 ug | INTRAMUSCULAR | Status: DC | PRN

## 2015-12-23 MED ORDER — MIDAZOLAM HCL 2 MG/2ML IJ SOLN
1.0000 mg | INTRAMUSCULAR | Status: DC | PRN
  Administered 2015-12-23: 2 mg via INTRAVENOUS

## 2015-12-23 MED ORDER — FENTANYL CITRATE (PF) 100 MCG/2ML IJ SOLN
50.0000 ug | INTRAMUSCULAR | Status: DC | PRN
  Administered 2015-12-23 (×2): 50 ug via INTRAVENOUS

## 2015-12-23 MED ORDER — ONDANSETRON HCL 4 MG/2ML IJ SOLN
INTRAMUSCULAR | Status: DC | PRN
  Administered 2015-12-23: 4 mg via INTRAVENOUS

## 2015-12-23 MED ORDER — GABAPENTIN 300 MG PO CAPS: 300.0000 mg | ORAL_CAPSULE | ORAL | Status: DC

## 2015-12-23 MED ORDER — HEPARIN SOD (PORK) LOCK FLUSH 100 UNIT/ML IV SOLN
INTRAVENOUS | Status: AC
  Filled 2015-12-23: qty 5

## 2015-12-23 MED ORDER — EPHEDRINE SULFATE 50 MG/ML IJ SOLN
INTRAMUSCULAR | Status: DC | PRN
  Administered 2015-12-23 (×2): 10 mg via INTRAVENOUS

## 2015-12-23 MED ORDER — LIDOCAINE 2% (20 MG/ML) 5 ML SYRINGE
INTRAMUSCULAR | Status: AC
  Filled 2015-12-23: qty 5

## 2015-12-23 SURGICAL SUPPLY — 34 items
ADH SKN CLS APL DERMABOND .7 (GAUZE/BANDAGES/DRESSINGS) ×1
BLADE SURG 15 STRL LF DISP TIS (BLADE) ×1 IMPLANT
BLADE SURG 15 STRL SS (BLADE) ×2
CHLORAPREP W/TINT 26ML (MISCELLANEOUS) ×2 IMPLANT
CLEANER CAUTERY TIP 5X5 PAD (MISCELLANEOUS) ×1 IMPLANT
COVER BACK TABLE 60X90IN (DRAPES) ×2 IMPLANT
COVER MAYO STAND STRL (DRAPES) ×2 IMPLANT
DERMABOND ADVANCED (GAUZE/BANDAGES/DRESSINGS) ×1
DERMABOND ADVANCED .7 DNX12 (GAUZE/BANDAGES/DRESSINGS) IMPLANT
DRAPE LAPAROTOMY T 102X78X121 (DRAPES) ×1 IMPLANT
DRAPE UTILITY XL STRL (DRAPES) ×3 IMPLANT
DRSG TEGADERM 4X4.75 (GAUZE/BANDAGES/DRESSINGS) ×2 IMPLANT
ELECT REM PT RETURN 9FT ADLT (ELECTROSURGICAL) ×2
ELECTRODE REM PT RTRN 9FT ADLT (ELECTROSURGICAL) ×1 IMPLANT
GLOVE BIO SURGEON STRL SZ 6.5 (GLOVE) ×1 IMPLANT
GLOVE BIOGEL PI IND STRL 6.5 (GLOVE) ×2 IMPLANT
GLOVE BIOGEL PI IND STRL 8 (GLOVE) ×1 IMPLANT
GLOVE BIOGEL PI INDICATOR 6.5 (GLOVE) ×2
GLOVE BIOGEL PI INDICATOR 8 (GLOVE) ×1
GLOVE ECLIPSE 7.5 STRL STRAW (GLOVE) ×2 IMPLANT
GOWN STRL REUS W/ TWL LRG LVL3 (GOWN DISPOSABLE) ×2 IMPLANT
GOWN STRL REUS W/TWL LRG LVL3 (GOWN DISPOSABLE) ×4
NEEDLE HYPO 25X1 1.5 SAFETY (NEEDLE) ×2 IMPLANT
PACK BASIN DAY SURGERY FS (CUSTOM PROCEDURE TRAY) ×2 IMPLANT
PAD CLEANER CAUTERY TIP 5X5 (MISCELLANEOUS) ×1
PENCIL BUTTON HOLSTER BLD 10FT (ELECTRODE) ×2 IMPLANT
SLEEVE SCD COMPRESS KNEE MED (MISCELLANEOUS) ×2 IMPLANT
STRIP CLOSURE SKIN 1/4X4 (GAUZE/BANDAGES/DRESSINGS) ×2 IMPLANT
SUT MNCRL AB 3-0 PS2 18 (SUTURE) ×1 IMPLANT
SUT SILK 2 0 SH (SUTURE) ×1 IMPLANT
SUT VIC AB 4-0 SH 27 (SUTURE) ×4
SUT VIC AB 4-0 SH 27XANBCTRL (SUTURE) ×2 IMPLANT
SYR CONTROL 10ML LL (SYRINGE) ×2 IMPLANT
TOWEL OR 17X24 6PK STRL BLUE (TOWEL DISPOSABLE) ×2 IMPLANT

## 2015-12-23 NOTE — Transfer of Care (Signed)
Immediate Anesthesia Transfer of Care Note  Patient: Joseph Soto  Procedure(s) Performed: Procedure(s): REEXCISION OF CHEST WALL TUMOR SITE (N/A)  Patient Location: PACU  Anesthesia Type:General  Level of Consciousness: sedated  Airway & Oxygen Therapy: Patient Spontanous Breathing and Patient connected to face mask oxygen  Post-op Assessment: Report given to RN and Post -op Vital signs reviewed and stable  Post vital signs: Reviewed and stable  Last Vitals:  Vitals:   12/23/15 0837 12/23/15 0839  BP: 122/67   Pulse: 99 62  Resp: (!) 33 15  Temp:      Last Pain:  Vitals:   12/23/15 0651  TempSrc: Oral         Complications: No apparent anesthesia complications

## 2015-12-23 NOTE — Anesthesia Postprocedure Evaluation (Signed)
Anesthesia Post Note  Patient: Joseph Soto  Procedure(s) Performed: Procedure(s) (LRB): REEXCISION OF CHEST WALL TUMOR SITE (N/A)  Patient location during evaluation: PACU Anesthesia Type: General Level of consciousness: awake Pain management: pain level controlled Vital Signs Assessment: post-procedure vital signs reviewed and stable Respiratory status: spontaneous breathing Cardiovascular status: stable Anesthetic complications: no    Last Vitals:  Vitals:   12/23/15 0909 12/23/15 0915  BP:  121/64  Pulse: 65 66  Resp: 14 16  Temp:      Last Pain:  Vitals:   12/23/15 0909  TempSrc:   PainSc: 0-No pain                 EDWARDS,Sindi Beckworth

## 2015-12-23 NOTE — Op Note (Signed)
OPERATIVE REPORT  DATE OF OPERATION: 12/23/2015  PATIENT:  Joseph Soto  70 y.o. male  PRE-OPERATIVE DIAGNOSIS:  Leiomyosarcoma with positive margins  POST-OPERATIVE DIAGNOSIS:  Leiomyosarcoma with positive margins  INDICATION(S) FOR OPERATION:  Positive margins from excised tumor  FINDINGS:  No evidence of infection.  No obvioustumor  PROCEDURE:  Procedure(s): REEXCISION OF CHEST WALL TUMOR SITE  SURGEON:  Surgeon(s): Judeth Horn, MD  ASSISTANT: None  ANESTHESIA:   general and LMA  COMPLICATIONS:  None  EBL: <10 ml  BLOOD ADMINISTERED: none  DRAINS: none   SPECIMEN:  Source of Specimen:  11 x 2 cm  skin  COUNTS CORRECT:  YES  PROCEDURE DETAILS: The patient was taken to the operating room and placed on the table in supine position. After an adequate general laryngeal airway anesthetic was administered, he was prepped and draped in usual sterile manner exposing his chest.  A proper timeout was performed identifying the patient and the procedure to be performed. The upper and lower portion of the previous incision was marked and measured approximately 8 and half centimeters long. Approximately a centimeter above and below the previous incision the area was marked and an oval marking made around the previous incision. This is our incision line which was done with a #15 blade down to subcutaneous tissue. Electrocautery was used to excise the tissue from the underlying subcutaneous tissue.  The lateral margins of the excised skin flap were marked with the marking pen and the superior part of the specimen was marked with a suture.  Electrocautery was used to obtain hemostasis and develop flaps laterally.  We then reapproximated the wound using interrupted 4-0 Vicryl subcutaneous tissues and a subcuticular stitch of 3-0 Monocryl. Once the skin was closed Dermabond Steri-Strips and Tegaderms views complete all dressings. All counts were correct including needles, sponges, and  instruments.  PATIENT DISPOSITION:  PACU - hemodynamically stable.   Carriann Hesse 11/13/20178:43 AM

## 2015-12-23 NOTE — Anesthesia Preprocedure Evaluation (Addendum)
Anesthesia Evaluation  Patient identified by MRN, date of birth, ID band Patient awake    Reviewed: Allergy & Precautions, NPO status   Airway Mallampati: II       Dental   Pulmonary former smoker,    breath sounds clear to auscultation       Cardiovascular hypertension, + CAD and + Past MI   Rhythm:Regular Rate:Normal     Neuro/Psych  Headaches,    GI/Hepatic negative GI ROS, Neg liver ROS,   Endo/Other    Renal/GU negative Renal ROS     Musculoskeletal  (+) Fibromyalgia -  Abdominal   Peds  Hematology   Anesthesia Other Findings   Reproductive/Obstetrics                             Anesthesia Physical Anesthesia Plan  ASA: III  Anesthesia Plan: General   Post-op Pain Management:    Induction: Intravenous  Airway Management Planned: LMA  Additional Equipment:   Intra-op Plan:   Post-operative Plan: Extubation in OR  Informed Consent: I have reviewed the patients History and Physical, chart, labs and discussed the procedure including the risks, benefits and alternatives for the proposed anesthesia with the patient or authorized representative who has indicated his/her understanding and acceptance.   Dental advisory given  Plan Discussed with: CRNA and Anesthesiologist  Anesthesia Plan Comments:         Anesthesia Quick Evaluation

## 2015-12-23 NOTE — Discharge Instructions (Addendum)
Excision of Skin Lesions, Care After Refer to this sheet in the next few weeks. These instructions provide you with information about caring for yourself after your procedure. Your health care provider may also give you more specific instructions. Your treatment has been planned according to current medical practices, but problems sometimes occur. Call your health care provider if you have any problems or questions after your procedure. WHAT TO EXPECT AFTER THE PROCEDURE After your procedure, it is common to have pain or discomfort at the excision site. HOME CARE INSTRUCTIONS  Take over-the-counter and prescription medicines only as told by your health care provider.  Follow instructions from your health care provider about:  How to take care of your excision site. You should keep the site clean, and protected for at least 48 hours.  When and how you should change your bandage (dressing)--keep dressing intact and may shower on top of plastic  When you should remove your dressing--at clinic visit.  Check the excision area every day for signs of infection. Watch for:  Redness, swelling, or pain.  Fluid, blood, or pus.  For bleeding, apply gentle but firm pressure to the area using a folded towel for 20 minutes.  Avoid high-impact exercise and activities until the stitches (sutures) are removed or the area heals.  Follow instructions from your health care provider about how to minimize scarring. Avoid sun exposure until the area has healed. Scarring should lessen over time.  Keep all follow-up visits as told by your health care provider. This is important. SEEK MEDICAL CARE IF:  You have a fever.  You have redness, swelling, or pain at the excision site.  You have fluid, blood, or pus coming from the excision site.  You have ongoing bleeding at the excision site.  You have pain that does not improve in 2-3 days after your procedure.  You notice skin irregularities or changes in  sensation.   This information is not intended to replace advice given to you by your health care provider. Make sure you discuss any questions you have with your health care provider.   Document Released: 06/12/2014 Document Reviewed: 06/12/2014 Elsevier Interactive Patient Education 2016 [place] [facility] Home Care Instructions  Activity: Get plenty of rest for the remainder of the day. A responsible adult should stay with you for 24 hours following the procedure.  For the next 24 hours, DO NOT: -Drive a car -Paediatric nurse -Drink alcoholic beverages -Take any medication unless instructed by your physician -Make any legal decisions or sign important papers.  Meals: Start with liquid foods such as gelatin or soup. Progress to regular foods as tolerated. Avoid greasy, spicy, heavy foods. If nausea and/or vomiting occur, drink only clear liquids until the nausea and/or vomiting subsides. Call your physician if vomiting continues.  Special Instructions/Symptoms: Your throat may feel dry or sore from the anesthesia or the breathing tube placed in your throat during surgery. If this causes discomfort, gargle with warm salt water. The discomfort should disappear within 24 hours.  If you had a scopolamine patch placed behind your ear for the management of post- operative nausea and/or vomiting:  1. The medication in the patch is effective for 72 hours, after which it should be removed.  Wrap patch in a tissue and discard in the trash. Wash hands thoroughly with soap and water. 2. You may remove the patch earlier than 72 hours if you experience unpleasant side effects which may include dry mouth, dizziness  or visual disturbances. 3. Avoid touching the patch. Wash your hands with soap and water after contact with the patch.   Call your surgeon if you experience:   1.  Fever over 101.0. 2.  Inability to urinate. 3.  Nausea and/or vomiting. 4.  Extreme swelling or  bruising at the surgical site. 5.  Continued bleeding from the incision. 6.  Increased pain, redness or drainage from the incision. 7.  Problems related to your pain medication. 8.  Any problems and/or concerns

## 2015-12-23 NOTE — H&P (Signed)
Joseph Soto is an 70 y.o. male.   Chief Complaint: Leiomyosarcoma of chest HPI: Recently excised mass, pathology demonstrated leiomyosarcoma with positive margins.  Back for re-excision  Past Medical History:  Diagnosis Date  . Buttock pain   . CAD (coronary artery disease)    Last cath 2010 per Dr. Doreatha Lew with diffuse multivessel disease, small caliber vessels not felt to be suitable for PCI  . Dizziness    1 episode   . Fibromyalgia   . Head pain   . HLD (hyperlipidemia)   . Hypertension   . Myocardial infarction   . Non Hodgkin's lymphoma [facility])     Past Surgical History:  Procedure Laterality Date  . Baker cyst removal    . CHOLECYSTECTOMY    . CORONARY ARTERY BYPASS GRAFT  5/07   x5. LV dysfunction.   Marland Kitchen MASS EXCISION N/A 11/11/2015   Procedure: EXCISION OF MID CHEST  MASS;  Surgeon: Judeth Horn, MD;  Location: [place];  Service: General;  Laterality: N/A;  . TONSILLECTOMY      Family History  Problem Relation Age of Onset  . Heart disease Father   . Heart attack Father   . Dementia Mother    Social History:  reports that he has quit smoking. He has never used smokeless tobacco. He reports that he does not drink alcohol or use drugs.  Allergies:  Allergies  Allergen Reactions  . Niacin And Related Nausea And Vomiting  . Niaspan [Niacin Er]     Stomach intolerance    Medications Prior to Admission  Medication Sig Dispense Refill  . aspirin EC 81 MG tablet Take 81 mg by mouth daily.    . cholecalciferol (VITAMIN D) 400 UNITS TABS Take 400 Units by mouth daily.    Marland Kitchen co-enzyme Q-10 30 MG capsule Take 100 mg by mouth daily.     . cyclobenzaprine (FLEXERIL) 10 MG tablet Take 10 mg by mouth 3 (three) times daily as needed. (MUSCLE SPASMS)  2  . DULoxetine (CYMBALTA) 60 MG capsule Take 1 capsule (60 mg total) by mouth daily. 30 capsule 11  . fish oil-omega-3 fatty acids 1000 MG capsule Take 2 g by mouth daily.      Marland Kitchen gabapentin (NEURONTIN) 300  MG capsule Take 300 mg by mouth daily.   12  . metoprolol tartrate (LOPRESSOR) 25 MG tablet TAKE 0.5 TABLETS (12.5 MG TOTAL) BY MOUTH 2 (TWO) TIMES DAILY. (Patient taking differently: Take 12.5 mg by mouth daily. TAKE 0.5 TABLETS (12.5 MG TOTAL) BY MOUTH 2 (TWO) TIMES DAILY.) 90 tablet 2  . Multiple Vitamin (MULTIVITAMIN) capsule Take 1 capsule by mouth daily.      . rosuvastatin (CRESTOR) 40 MG tablet TAKE 1 TABLET BY MOUTH EVERY DAY 90 tablet 2  . traMADol (ULTRAM) 50 MG tablet Take 1-2 tablets (50-100 mg total) by mouth every 6 (six) hours as needed for moderate pain or severe pain. 40 tablet 0  . triamcinolone (NASACORT) 55 MCG/ACT AERO nasal inhaler Place 1 spray into the nose daily. 1 Inhaler 12  . nitroGLYCERIN (NITROSTAT) 0.4 MG SL tablet Place 1 tablet (0.4 mg total) under the tongue every 5 (five) minutes as needed for chest pain. (Patient not taking: Reported on 12/23/2015) 25 tablet 6  . ondansetron (ZOFRAN ODT) 4 MG disintegrating tablet 4mg  ODT q4 hours prn nausea/vomit 4 tablet 0    Results for orders placed or performed during the hospital encounter of 12/23/15 (from the past 48 hour(s))  I-STAT, chem 8     Status: Abnormal   Collection Time: 12/23/15  7:22 AM  Result Value Ref Range   Sodium 139 135 - 145 mmol/L   Potassium 4.1 3.5 - 5.1 mmol/L   Chloride 105 101 - 111 mmol/L   BUN 23 (H) 6 - 20 mg/dL   Creatinine, Ser 0.90 0.61 - 1.24 mg/dL   Glucose, Bld 101 (H) 65 - 99 mg/dL   Calcium, Ion 1.12 (L) 1.15 - 1.40 mmol/L   TCO2 22 0 - 100 mmol/L   Hemoglobin 13.6 13.0 - 17.0 g/dL   HCT 40.0 39.0 - 52.0 %   No results found.  Review of Systems  Constitutional: Negative for chills and fever.  Respiratory: Positive for cough (Recent URI, improved without fever).   All other systems reviewed and are negative.   Blood pressure 131/72, pulse 80, temperature 97.7 F (36.5 C), temperature source Oral, resp. rate 20, height 5\' 11"  (1.803 m), weight 84.4 kg (186 lb), SpO2 97  %. Physical Exam  Vitals reviewed. Constitutional: He is oriented to person, place, and time. He appears well-developed and well-nourished.  HENT:  Head: Normocephalic and atraumatic.  Eyes: Conjunctivae and EOM are normal. Pupils are equal, round, and reactive to light.  Cardiovascular: Normal rate, regular rhythm and normal heart sounds.   Respiratory: Effort normal and breath sounds normal.    GI: Soft. Bowel sounds are normal.  Musculoskeletal: Normal range of motion.  Neurological: He is alert and oriented to person, place, and time.  Skin: Skin is warm and dry.  Psychiatric: He has a normal mood and affect. His behavior is normal. Judgment and thought content normal.     Assessment/Plan Positive margin of recently excised leiomyosarcoma, for re-excision  OR with preoperative antibiotics  Judeth Horn, MD 12/23/2015, 7:36 AM

## 2015-12-23 NOTE — Anesthesia Procedure Notes (Signed)
Procedure Name: LMA Insertion Date/Time: 12/23/2015 7:47 AM Performed by: Maryella Shivers Pre-anesthesia Checklist: Patient identified, Emergency Drugs available, Suction available and Patient being monitored Patient Re-evaluated:Patient Re-evaluated prior to inductionOxygen Delivery Method: Circle system utilized Preoxygenation: Pre-oxygenation with 100% oxygen Intubation Type: IV induction Ventilation: Mask ventilation without difficulty LMA: LMA inserted LMA Size: 5.0 Number of attempts: 1 Airway Equipment and Method: Bite block Placement Confirmation: positive ETCO2 Tube secured with: Tape Dental Injury: Teeth and Oropharynx as per pre-operative assessment

## 2015-12-24 ENCOUNTER — Encounter: Payer: Self-pay | Admitting: Neurology

## 2015-12-24 ENCOUNTER — Encounter (HOSPITAL_BASED_OUTPATIENT_CLINIC_OR_DEPARTMENT_OTHER): Payer: Self-pay | Admitting: General Surgery

## 2015-12-27 ENCOUNTER — Inpatient Hospital Stay: Admission: RE | Admit: 2015-12-27 | Payer: Medicare HMO | Source: Ambulatory Visit

## 2015-12-27 ENCOUNTER — Encounter: Payer: Self-pay | Admitting: Neurology

## 2015-12-29 ENCOUNTER — Ambulatory Visit
Admission: RE | Admit: 2015-12-29 | Discharge: 2015-12-29 | Disposition: A | Discharge status: Home / Self Care | Payer: Medicare HMO | Source: Ambulatory Visit | Attending: Neurology | Admitting: Neurology

## 2015-12-29 DIAGNOSIS — M5126 Other intervertebral disc displacement, lumbar region: Secondary | ICD-10-CM | POA: Diagnosis not present

## 2015-12-29 DIAGNOSIS — M5442 Lumbago with sciatica, left side: Principal | ICD-10-CM

## 2015-12-29 DIAGNOSIS — E785 Hyperlipidemia, unspecified: Secondary | ICD-10-CM

## 2015-12-29 DIAGNOSIS — I255 Ischemic cardiomyopathy: Secondary | ICD-10-CM

## 2015-12-29 DIAGNOSIS — G8929 Other chronic pain: Secondary | ICD-10-CM

## 2015-12-29 DIAGNOSIS — G44221 Chronic tension-type headache, intractable: Secondary | ICD-10-CM

## 2015-12-29 DIAGNOSIS — I25810 Atherosclerosis of coronary artery bypass graft(s) without angina pectoris: Secondary | ICD-10-CM

## 2016-01-14 DIAGNOSIS — R0902 Hypoxemia: Secondary | ICD-10-CM | POA: Diagnosis not present

## 2016-01-24 ENCOUNTER — Encounter (INDEPENDENT_AMBULATORY_CARE_PROVIDER_SITE_OTHER): Payer: Self-pay

## 2016-01-24 ENCOUNTER — Ambulatory Visit (INDEPENDENT_AMBULATORY_CARE_PROVIDER_SITE_OTHER): Payer: Medicare HMO | Admitting: Neurology

## 2016-01-24 DIAGNOSIS — Z0289 Encounter for other administrative examinations: Secondary | ICD-10-CM

## 2016-01-24 DIAGNOSIS — M5442 Lumbago with sciatica, left side: Secondary | ICD-10-CM

## 2016-01-24 DIAGNOSIS — I25810 Atherosclerosis of coronary artery bypass graft(s) without angina pectoris: Secondary | ICD-10-CM

## 2016-01-24 DIAGNOSIS — I255 Ischemic cardiomyopathy: Secondary | ICD-10-CM | POA: Diagnosis not present

## 2016-01-24 DIAGNOSIS — G44221 Chronic tension-type headache, intractable: Secondary | ICD-10-CM | POA: Diagnosis not present

## 2016-01-24 DIAGNOSIS — G8929 Other chronic pain: Secondary | ICD-10-CM | POA: Diagnosis not present

## 2016-01-24 DIAGNOSIS — E785 Hyperlipidemia, unspecified: Secondary | ICD-10-CM

## 2016-01-24 MED ORDER — TIZANIDINE HCL 2 MG PO TABS: 2.0000 mg | ORAL_TABLET | Freq: Three times a day (TID) | ORAL | 6 refills | Status: DC

## 2016-01-24 NOTE — Procedures (Signed)
        Full Name: Rafiq Oiler Gender: Male MRN #: [id number] Date of Birth: 01-Jul-1945    Visit Date: 01/24/2016 09:03 Age: 70 Years 38 Months Old Examining Physician: Marcial Pacas, MD  Referring Physician: Krista Blue History:  70 year old male complains of chronic left buttock area pain,  Summary of the test: Nerve conduction study: Left sural, superficial peroneal sensory responses were normal. Left peroneal to EDB and tibial motor responses were normal. Bilateral tibial H reflexes were present and symmetric.  Electromyography: Selective needle examination of left lower extremity muscles and left lumbosacral paraspinal muscles was normal.    Conclusion: This is a normal study, there is no electrodiagnostic evidence of left lower extremity neuropathy or left lumbosacral radiculopathy.    ------------------------------- Marcial Pacas, M.D.  [facility] [place], [place] [address] Tel: [phone] Fax: [phone]        [facility]    Nerve / Sites Rec. Site Peak Lat Ref. Amp.1-2 Ref. Distance    ms ms V V cm  L Sural - Ankle (Calf)     Calf Ankle 3.91 ?4.40 5.8 ?6.0 14  L Superficial peroneal - Ankle     Lat leg Ankle 4.17 ?4.40 7.6 ?6.0 14     [facility]    Nerve / Sites Muscle Latency Ref. Amplitude Ref. Rel Amp Segments Distance Lat Diff Velocity Ref. Area    ms ms mV mV %  cm ms m/s m/s mVms  L Peroneal - EDB     Ankle EDB 4.8 ?6.5 6.4 ?2.0 100 Ankle - EDB 9    22.9     Fib head EDB 11.9  6.0  93.1 Fib head - Ankle 32 7.0 46 ?44 21.8     Pop fossa EDB 14.0  5.6  94 Pop fossa - Fib head 10 2.1 48 ?44 21.4         Pop fossa - Ankle  9.1     L Tibial - AH     Ankle AH 4.3 ?5.8 17.6 ?4.0 100 Ankle - AH 9    37.7     Pop fossa AH 13.0  11.6  66.1 Pop fossa - Ankle 37 8.7 43 ?41 29.3     F  Wave    Nerve F Lat Ref.   ms ms  L Peroneal - EDB 55.6 ?56.0  L Tibial - AH 58.1 ?56.0  R Tibial - AH 58.4 ?56.0     H Reflex    Nerve H Lat Lat Hmax   ms  ms   Left Right Ref. Left Right Ref.  Tibial - [place] 42.8 42.4 ?35.0 38.8 36.3 ?35.0     EMG full       EMG Summary Table    Spontaneous MUAP Recruitment  Muscle IA Fib PSW Fasc Other Amp Dur. Poly Pattern  L. Tibialis anterior Normal None None None _______ Normal Normal Normal Normal  L. Tibialis posterior Normal None None None _______ Normal Normal Normal Normal  L. Vastus lateralis Normal None None None _______ Normal Normal Normal Normal  L. Biceps femoris (short head) Normal None None None _______ Normal Normal Normal Normal  L. Lumbar paraspinals Normal None None None _______ Normal Normal Normal Normal  L. Gluteus medius Normal None None None _______ Normal Normal Normal Normal

## 2016-01-24 NOTE — Progress Notes (Signed)
PATIENT: Joseph Soto DOB: Aug 09, 1945  No chief complaint on file.    HISTORICAL  Joseph Soto Is a 70 years old left-handed male,seen in refer by his primary care doctor  [name] for evaluation of chronic low back pain, head pressure, initial evaluation was on December 02 2015  He had a history of coronary artery disease, status post bypass in 2007, hyperlipidemia, per patient, there was incidental finding of non-Hodgkin's lymphoma, but never required treatment. He was also recently treated for chest wall mass along previous bypass scar, had mass resection on November 11 2015, I personally reviewed pathology report, 3 cm mass, consistent with leiomyosarcoma  He reported a history of chronic left buttock area pain for more than 20 years, he described transient sharp pain, intermixed with deep achy pain, he denies radiating pain to distal left leg, no significant gait abnormality.  Over the years, he was treated with pelvic floor muscle arch, acupuncture, chiropractor, massage therapist, dry needling with limited help, he has been taking Flexeril 10 mg twice a day, gabapentin 300 mg 2 tablets twice a day, which has been helpful, but he complains of side effect of drowsiness, dry mouth.  Also had a history of allergic symptoms used to be around spring, gets better at the winter months, but over the past few years, he has year-round symptoms, he described squeezing sensation along his skull  He is physically active, playing golf all regularly, he was previously evaluated by sleep specialist Dr. 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  UPDATE Jan 24 2016: I have reviewed MRI lumbar in Nov 2017: multilevel degenerative disc disease most severe at L3-4, L4-5, mild bilateral foraminal stenosis no evidence of root compression, Today's electrodiagnostic study was normal,there was no evidence of left lumbar sacral radiculopathy, or large fiber peripheral neuropathy.  He also complains of chronic bilateral skull area attention, pressure pain, has tried massage therapy with limited benefit,he has tried different medications in the past with limited benefit also could not tolerate the side effect, such as nortriptyline, Flexeril,  Laboratory evaluation in November 2017 showed normal CMP,CBC,negative ANA, rheumatoid factor, C-reactive protein, ESR,TSH,itamin D,vitamin B12  REVIEW OF SYSTEMS: Full 14 system review of systems performed and notable only for as above.  ALLERGIES: Allergies  Allergen Reactions  . Niacin And Related Nausea And Vomiting  . Niaspan [Niacin Er]     Stomach intolerance    HOME MEDICATIONS: Current Outpatient Prescriptions  Medication Sig Dispense Refill  . aspirin EC 81 MG tablet Take 81 mg by mouth daily.    . cholecalciferol (VITAMIN D) 400 UNITS TABS Take 400 Units by mouth daily.    Marland Kitchen co-enzyme Q-10 30 MG capsule Take 100 mg by mouth daily.     . cyclobenzaprine (FLEXERIL) 10 MG tablet Take 10 mg by mouth 3 (three) times daily as needed. (MUSCLE SPASMS)  2  . DULoxetine (CYMBALTA) 60 MG capsule Take 1 capsule (60 mg total) by mouth daily. 30 capsule 11  . fish oil-omega-3 fatty acids 1000 MG capsule Take 2 g by mouth daily.      Marland Kitchen gabapentin (NEURONTIN) 300 MG capsule Take 300 mg by mouth daily.  12  . metoprolol tartrate (LOPRESSOR) 25 MG tablet TAKE 0.5 TABLETS (12.5 MG TOTAL) BY MOUTH 2 (TWO) TIMES DAILY. (Patient taking differently: Take 12.5 mg by mouth daily. TAKE 0.5 TABLETS (12.5 MG TOTAL) BY MOUTH 2 (TWO) TIMES DAILY.) 90 tablet 2  . Multiple Vitamin (MULTIVITAMIN) capsule Take  1 capsule by mouth daily.      . nitroGLYCERIN (NITROSTAT) 0.4 MG SL tablet Place 1 tablet (0.4 mg total) under the tongue every 5 (five) minutes as needed for chest pain. (Patient not taking: Reported on 12/23/2015) 25 tablet 6  . ondansetron (ZOFRAN ODT) 4 MG disintegrating tablet '4mg'$  ODT q4 hours prn nausea/vomit 4 tablet 0  . rosuvastatin (CRESTOR) 40 MG tablet TAKE 1 TABLET BY MOUTH EVERY DAY 90 tablet 2  . traMADol (ULTRAM) 50 MG tablet Take 1-2 tablets (50-100 mg total) by mouth every 6 (six) hours as needed for moderate pain or severe pain. 40 tablet 0  . traMADol (ULTRAM) 50 MG tablet Take 1-2 tablets (50-100 mg total) by mouth every 6 (six) hours as needed for moderate pain or severe pain. 30 tablet 0  . triamcinolone (NASACORT) 55 MCG/ACT AERO nasal inhaler Place 1 spray into the nose daily. 1 Inhaler 12   No current facility-administered medications for this visit.     PAST MEDICAL HISTORY: Past Medical History:  Diagnosis Date  . Buttock pain   . CAD (coronary artery disease)    Last cath 2010 per Dr. Doreatha Lew with diffuse multivessel disease, small caliber vessels not felt to be suitable for PCI  . Dizziness    1 episode   . Fibromyalgia   . Head pain   . HLD (hyperlipidemia)   . Hypertension   . Myocardial infarction   . Non Hodgkin's lymphoma ([place])     PAST SURGICAL HISTORY: Past Surgical History:  Procedure Laterality Date  . Baker cyst removal    . CHOLECYSTECTOMY    . CORONARY ARTERY BYPASS GRAFT  5/07   x5. LV dysfunction.   Marland Kitchen MASS EXCISION N/A 11/11/2015   Procedure: EXCISION OF MID CHEST  MASS;  Surgeon: Judeth Horn, MD;  Location: [place];  Service: General;  Laterality: N/A;  . MASS EXCISION N/A 12/23/2015   Procedure: REEXCISION OF CHEST WALL TUMOR SITE;  Surgeon: Judeth Horn, MD;  Location: [place];  Service: General;  Laterality: N/A;  . TONSILLECTOMY      FAMILY HISTORY: Family History  Problem Relation Age of  Onset  . Heart disease Father   . Heart attack Father   . Dementia Mother     SOCIAL HISTORY:  Social History   Social History  . Marital status: Married    Spouse name: [name]  . Number of children: 2  . Years of education: college   Occupational History  . Camera operator    Social History Main Topics  . Smoking status: Former Research scientist (life sciences)  . Smokeless tobacco: Never Used     Comment: quit in 2007   . Alcohol use No  . Drug use: No  . Sexual activity: Not on file   Other Topics Concern  . Not on file   Social History Narrative   Married, 2 children.   Runs an executive recruiting company.    Left-handed.   Drinks a bottle of green tea.     PHYSICAL EXAM   There were no vitals filed for this visit.  Not recorded      There is no height  or weight on file to calculate BMI.  PHYSICAL EXAMNIATION:  Gen: NAD, conversant, well nourised, obese, well groomed                     Cardiovascular: Regular rate rhythm, no peripheral edema, warm, nontender. Eyes: Conjunctivae clear without exudates or hemorrhage Neck: Supple, no carotid bruits. Pulmonary: Clear to auscultation bilaterally   NEUROLOGICAL EXAM:  MENTAL STATUS: Speech:    Speech is normal; fluent and spontaneous with normal comprehension.  Cognition:     Orientation to time, place and person     Normal recent and remote memory     Normal Attention span and concentration     Normal Language, naming, repeating,spontaneous speech     Fund of knowledge   CRANIAL NERVES: CN II: Visual fields are full to confrontation. Fundoscopic exam is normal with sharp discs and no vascular changes. Pupils are round equal and briskly reactive to light. CN III, IV, VI: extraocular movement are normal. No ptosis. CN V: Facial sensation is intact to pinprick in all 3 divisions bilaterally. Corneal responses are intact.  CN VII: Face is symmetric with normal eye closure and smile. CN VIII: Hearing is normal to rubbing  fingers CN IX, X: Palate elevates symmetrically. Phonation is normal. CN XI: Head turning and shoulder shrug are intact CN XII: Tongue is midline with normal movements and no atrophy.  MOTOR: There is no pronator drift of out-stretched arms. Muscle bulk and tone are normal. Muscle strength is normal.  REFLEXES: Reflexes are 2+ and symmetric at the biceps, triceps, knees, and ankles. Plantar responses are flexor.  SENSORY: Intact to light touch, pinprick, positional sensation and vibratory sensation are intact in fingers and toes.  COORDINATION: Rapid alternating movements and fine finger movements are intact. There is no dysmetria on finger-to-nose and heel-knee-shin.    GAIT/STANCE: Posture is normal. Gait is steady with normal steps, base, arm swing, and turning. Heel and toe walking are normal. Tandem gait is normal.  Romberg is absent.   DIAGNOSTIC DATA (LABS, IMAGING, TESTING) - I reviewed patient records, labs, notes, testing and imaging myself where available.   ASSESSMENT AND PLAN  Joseph Soto is a 70 y.o. male   Chronic low back pain, radiating pain to left hip, No evidence of left lumbosacral radiculopathy Refer him to pain management  Chronic tension headaches No treatable etiology found, continue Cymbalta 60 mg every morning, tapering off Flexeril, decrease gabapentin to 300 mg 2 tablets every night to avoid the side effect Add on tizanidine '2mg'$  tid   Marcial Pacas, M.D. Ph.D.  [facility] [address], [place], [place] [address] Ph: [phone] Fax: [phone]  CC: [name], MD

## 2016-01-25 ENCOUNTER — Encounter: Payer: Self-pay | Admitting: Neurology

## 2016-01-27 ENCOUNTER — Other Ambulatory Visit: Payer: Self-pay | Admitting: *Deleted

## 2016-01-27 MED ORDER — DULOXETINE HCL 60 MG PO CPEP: 60.0000 mg | ORAL_CAPSULE | Freq: Every day | ORAL | 3 refills | Status: DC

## 2016-01-28 ENCOUNTER — Encounter: Payer: Self-pay | Admitting: Neurology

## 2016-02-14 DIAGNOSIS — R0902 Hypoxemia: Secondary | ICD-10-CM | POA: Diagnosis not present

## 2016-02-20 DIAGNOSIS — G894 Chronic pain syndrome: Secondary | ICD-10-CM | POA: Diagnosis not present

## 2016-02-20 DIAGNOSIS — M791 Myalgia: Secondary | ICD-10-CM | POA: Diagnosis not present

## 2016-02-20 DIAGNOSIS — M545 Low back pain: Secondary | ICD-10-CM | POA: Diagnosis not present

## 2016-02-20 DIAGNOSIS — Z79899 Other long term (current) drug therapy: Secondary | ICD-10-CM | POA: Diagnosis not present

## 2016-02-20 DIAGNOSIS — Z79891 Long term (current) use of opiate analgesic: Secondary | ICD-10-CM | POA: Diagnosis not present

## 2016-03-16 DIAGNOSIS — R0902 Hypoxemia: Secondary | ICD-10-CM | POA: Diagnosis not present

## 2016-03-19 DIAGNOSIS — Z79899 Other long term (current) drug therapy: Secondary | ICD-10-CM | POA: Diagnosis not present

## 2016-03-19 DIAGNOSIS — Z79891 Long term (current) use of opiate analgesic: Secondary | ICD-10-CM | POA: Diagnosis not present

## 2016-03-19 DIAGNOSIS — M47816 Spondylosis without myelopathy or radiculopathy, lumbar region: Secondary | ICD-10-CM | POA: Diagnosis not present

## 2016-03-19 DIAGNOSIS — M533 Sacrococcygeal disorders, not elsewhere classified: Secondary | ICD-10-CM | POA: Diagnosis not present

## 2016-03-19 DIAGNOSIS — G894 Chronic pain syndrome: Secondary | ICD-10-CM | POA: Diagnosis not present

## 2016-03-19 DIAGNOSIS — M545 Low back pain: Secondary | ICD-10-CM | POA: Diagnosis not present

## 2016-03-19 DIAGNOSIS — M791 Myalgia: Secondary | ICD-10-CM | POA: Diagnosis not present

## 2016-04-03 ENCOUNTER — Ambulatory Visit (INDEPENDENT_AMBULATORY_CARE_PROVIDER_SITE_OTHER): Payer: Medicare HMO | Admitting: Cardiovascular Disease

## 2016-04-03 ENCOUNTER — Encounter: Payer: Self-pay | Admitting: Cardiovascular Disease

## 2016-04-03 VITALS — BP 110/70 | HR 74 | Ht 71.0 in | Wt 196.4 lb

## 2016-04-03 DIAGNOSIS — E7800 Pure hypercholesterolemia, unspecified: Secondary | ICD-10-CM | POA: Diagnosis not present

## 2016-04-03 DIAGNOSIS — I25810 Atherosclerosis of coronary artery bypass graft(s) without angina pectoris: Secondary | ICD-10-CM | POA: Diagnosis not present

## 2016-04-03 MED ORDER — METOPROLOL SUCCINATE ER 25 MG PO TB24: 25.0000 mg | ORAL_TABLET | Freq: Every day | ORAL | 3 refills | Status: DC

## 2016-04-03 NOTE — Patient Instructions (Signed)
Medication Instructions:  Your physician has recommended you make the following change in your medication:  Stop metoprolol tartrate. Start Toprol (metoprolol succinate) 25 mg by mouth daily.    Labwork: none  Testing/Procedures: none  Follow-Up: Your physician recommends that you schedule a follow-up appointment in: 12 months. Please call our office in about 9 months to schedule this appointment.     Any Other Special Instructions Will Be Listed Below (If Applicable).     If you need a refill on your cardiac medications before your next appointment, please call your pharmacy.

## 2016-04-03 NOTE — Progress Notes (Signed)
Chief Complaint  Patient presents with  . Follow-up     History of Present Illness: 71 yo WM with history of CAD s/p CABG 2007, Non-hodgkins Lymphoma, HLD here today for cardiac follow up. He has been followed in the past by Dr. Doreatha Lew. He had a 4V CABG in 2007. Last cath February 2010 with occluded SVG to Diagonal and occluded SVG to PDA/PLA withpatent LIMA graft to the  LAD. There was diffuse disease in distal vessels felt best to be managed medically. Last echo 10/31/13 with normal LV function. 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   He tells me that he is doing well. He has had no chest pain or SOB. Denies any palpitations, near syncope or syncope.   Primary Care Physician: Donnajean Lopes, MD   Past Medical History:  Diagnosis Date  . Buttock pain   . CAD (coronary artery disease)    Last cath 2010 per Dr. Doreatha Lew with diffuse multivessel disease, small caliber vessels not felt to be suitable for PCI  . Dizziness    1 episode   . Fibromyalgia   . Head pain   . HLD (hyperlipidemia)   . Hypertension   . Myocardial infarction   . Non Hodgkin's lymphoma [facility])     Past Surgical History:  Procedure Laterality Date  . Baker cyst removal    . CHOLECYSTECTOMY    . CORONARY ARTERY BYPASS GRAFT  5/07   x5. LV dysfunction.   Marland Kitchen MASS EXCISION N/A 11/11/2015   Procedure: EXCISION OF MID CHEST  MASS;  Surgeon: Judeth Horn, MD;  Location: [place];  Service: General;  Laterality: N/A;  . MASS EXCISION N/A 12/23/2015   Procedure: REEXCISION OF CHEST WALL TUMOR SITE;  Surgeon: Judeth Horn, MD;  Location: [place];  Service: General;  Laterality: N/A;  . TONSILLECTOMY      Current Outpatient Prescriptions  Medication Sig Dispense Refill  . aspirin EC 81 MG tablet Take 81 mg by mouth daily.    . cholecalciferol (VITAMIN D) 400 UNITS TABS Take 400 Units by mouth daily.    Marland Kitchen co-enzyme Q-10 30 MG capsule Take 100  mg by mouth daily.     . DULoxetine (CYMBALTA) 60 MG capsule Take 1 capsule (60 mg total) by mouth daily. 90 capsule 3  . fish oil-omega-3 fatty acids 1000 MG capsule Take 2 g by mouth daily.      Marland Kitchen gabapentin (NEURONTIN) 300 MG capsule Take 300 mg by mouth daily.   12  . Multiple Vitamin (MULTIVITAMIN) capsule Take 1 capsule by mouth daily.      . nitroGLYCERIN (NITROSTAT) 0.4 MG SL tablet Place 1 tablet (0.4 mg total) under the tongue every 5 (five) minutes as needed for chest pain. 25 tablet 6  . ondansetron (ZOFRAN ODT) 4 MG disintegrating tablet 4mg  ODT q4 hours prn nausea/vomit 4 tablet 0  . rosuvastatin (CRESTOR) 40 MG tablet TAKE 1 TABLET BY MOUTH EVERY DAY 90 tablet 2  . tiZANidine (ZANAFLEX) 2 MG tablet Take 1 tablet (2 mg total) by mouth 3 (three) times daily. 90 tablet 6  . traMADol (ULTRAM) 50 MG tablet Take 100 mg by mouth 3 (three) times daily.    Marland Kitchen triamcinolone (NASACORT) 55 MCG/ACT AERO nasal inhaler Place 1 spray into the nose daily. 1 Inhaler 12  . metoprolol succinate (TOPROL XL) 25 MG 24 hr tablet Take 1 tablet (25 mg total) by mouth daily.  90 tablet 3   No current facility-administered medications for this visit.     Allergies  Allergen Reactions  . Niacin And Related Nausea And Vomiting  . Niaspan [Niacin Er]     Stomach intolerance    Social History   Social History  . Marital status: Married    Spouse name: [name]  . Number of children: 2  . Years of education: college   Occupational History  . Camera operator    Social History Main Topics  . Smoking status: Former Research scientist (life sciences)  . Smokeless tobacco: Never Used     Comment: quit in 2007   . Alcohol use No  . Drug use: No  . Sexual activity: Not on file   Other Topics Concern  . Not on file   Social History Narrative   Married, 2 children.   Runs an executive recruiting company.    Left-handed.   Drinks a bottle of green tea.    Family History  Problem Relation Age of Onset  . Heart disease  Father   . Heart attack Father   . Dementia Mother     Review of Systems:  As stated in the HPI and otherwise negative.   BP 110/70   Pulse 74   Ht 5\' 11"  (1.803 m)   Wt 196 lb 6.4 oz (89.1 kg)   BMI 27.39 kg/m   Physical Examination: General: Well developed, well nourished, NAD  HEENT: OP clear, mucus membranes moist  SKIN: warm, dry. No rashes. Neuro: No focal deficits  Musculoskeletal: Muscle strength 5/5 all ext  Psychiatric: Mood and affect normal  Neck: No JVD, no carotid bruits, no thyromegaly, no lymphadenopathy.  Lungs:Clear bilaterally, no wheezes, rhonci, crackles Cardiovascular: Regular rate and rhythm. No murmurs, gallops or rubs. Abdomen:Soft. Bowel sounds present. Non-tender.  Extremities: No lower extremity edema. Pulses are 2 + in the bilateral DP/PT.  Cardiac cath February 2010: CORONARY ARTERIES:  1. Left main coronary artery has mild irregularities, but no      significant focal stenosis.  The left anterior descending has a 70% stenosis proximally.  There      arises a moderate diagonal vessel that has a 90% stenosis.  Further      down the left anterior descending, there is a 95% focal stenosis.      There is a diagonal vessel that fills by bidirectional flow as well      as bidirectional flow into the distal left anterior descending with      internal mammary graft.  Left circumflex.  Left circumflex bifurcates almost at its ostium.      The obtuse marginal was a previously bypass graft and it has 30-40%      narrowing.  It has good distal runoff.  The circumflex branch      proceeds in the AV groove has an ostial 70% stenosis.  The right coronary artery is a large dominant vessel.  There is a      30-40% scattered narrowing before the crux and 50% narrowing just      before the crux.  There is a 70% ostial narrowing in the second      posterior descending and a 95% stenosis in the midportion of the      small vessels.  There is 30-40% stenosis in  the first posterior      descending.  There is 80-90% stenosis in ostial portion of the      small RV branch.  Saphenous vein  graft to posterior descending and posterolateral      branch is occluded proximally.  Saphenous vein graft to the intermediate diagonal vessel was      occluded proximally.  The left internal mammary artery to the LAD is patent with      excellent flow.  Echo 10/31/13: Left ventricle: The cavity size was normal. Wall thickness was normal. LV mid ventricular false tendon. Systolic function was normal. The estimated ejection fraction was in the range of 55% to 60%. Mild inferior hypokinesis. Doppler parameters are consistent with abnormal left ventricular relaxation (grade 1 diastolic dysfunction). The E/e&' ratio is <8, suggesting normal LV filling pressure. - Mitral valve: Mildly thickened leaflets . There was trivial regurgitation. - Left atrium: The atrium was at the upper limits of normal in size. - Right atrium: The atrium was at the upper limits of normal in size.  Impressions:  - Compared to the prior echo in 2012, the EF is slightly higher at 55-60%, there is mild inferior hypokinesis. Diastolic dysfunction is present with normal LV filling pressure.  EKG:  EKG is ordered today. The ekg ordered today demonstrates  NSR, rate 74 bpm  Recent Labs: 12/03/2015: TSH 3.150 12/20/2015: Platelets 292 12/23/2015: BUN 23; Creatinine, Ser 0.90; Hemoglobin 13.6; Potassium 4.1; Sodium 139   Lipid Panel Followed in primary care   Wt Readings from Last 3 Encounters:  04/03/16 196 lb 6.4 oz (89.1 kg)  12/23/15 186 lb (84.4 kg)  12/19/15 191 lb (86.6 kg)     Other studies Reviewed: Additional studies/ records that were reviewed today include: . Review of the above records demonstrates:    Assessment and Plan:   1. CAD without angina: No chest pain suggestive of angina.He is known to have diffuse CAD with one patent bypass  graft by cardiac cath in 2010. Will continue ASA, statin, beta blocker.   2. Hyperlipidemia: Continue statin. Lipids followed in primary care.   Current medicines are reviewed at length with the patient today.  The patient does not have concerns regarding medicines.  The following changes have been made:  no change  Labs/ tests ordered today include:   Orders Placed This Encounter  Procedures  . EKG 12-Lead    Disposition:   FU with me in 12  months  Signed, Lauree Chandler, MD 04/03/2016 1:33 PM    [facility] [place], [place], [place]  [address] Phone: [phone]; Fax: [phone]

## 2016-04-13 DIAGNOSIS — R0902 Hypoxemia: Secondary | ICD-10-CM | POA: Diagnosis not present

## 2016-04-28 DIAGNOSIS — M533 Sacrococcygeal disorders, not elsewhere classified: Secondary | ICD-10-CM | POA: Diagnosis not present

## 2016-04-28 DIAGNOSIS — M545 Low back pain: Secondary | ICD-10-CM | POA: Diagnosis not present

## 2016-04-28 DIAGNOSIS — M47816 Spondylosis without myelopathy or radiculopathy, lumbar region: Secondary | ICD-10-CM | POA: Diagnosis not present

## 2016-04-28 DIAGNOSIS — Z79891 Long term (current) use of opiate analgesic: Secondary | ICD-10-CM | POA: Diagnosis not present

## 2016-04-28 DIAGNOSIS — M791 Myalgia: Secondary | ICD-10-CM | POA: Diagnosis not present

## 2016-04-28 DIAGNOSIS — G894 Chronic pain syndrome: Secondary | ICD-10-CM | POA: Diagnosis not present

## 2016-04-28 DIAGNOSIS — Z79899 Other long term (current) drug therapy: Secondary | ICD-10-CM | POA: Diagnosis not present

## 2016-04-29 ENCOUNTER — Encounter: Payer: Self-pay | Admitting: Nurse Practitioner

## 2016-04-29 ENCOUNTER — Telehealth: Payer: Self-pay | Admitting: Cardiovascular Disease

## 2016-04-29 ENCOUNTER — Ambulatory Visit (INDEPENDENT_AMBULATORY_CARE_PROVIDER_SITE_OTHER): Payer: Medicare HMO | Admitting: Nurse Practitioner

## 2016-04-29 VITALS — BP 132/66 | HR 78 | Ht 71.0 in | Wt 195.4 lb

## 2016-04-29 DIAGNOSIS — R0602 Shortness of breath: Secondary | ICD-10-CM | POA: Diagnosis not present

## 2016-04-29 NOTE — Telephone Encounter (Signed)
New Message  Pt c/o Shortness Of Breath: STAT if SOB developed within the last 24 hours or pt is noticeably SOB on the phone  1. Are you currently SOB (can you hear that pt is SOB on the phone)? Yes  2. How long have you been experiencing SOB? Started about a month ago thought it was a cold or flu but has no symptoms  3. Are you SOB when sitting or when up moving around? Up moving around  4. Are you currently experiencing any other symptoms? Some evening he often gets warm.

## 2016-04-29 NOTE — Progress Notes (Signed)
CARDIOLOGY OFFICE NOTE  Date:  04/29/2016    [name] Date of Birth: Jun 24, 1945 Medical Record #[id number]  PCP:  Donnajean Lopes, MD  Cardiologist:  [facility]    Chief Complaint  Patient presents with  . Shortness of Breath    Work in visit - seen for Dr. Angelena Form    History of Present Illness: Joseph Soto is a 71 y.o. male who presents today for a work in visit. Seen for Dr. Angelena Form.   He has a history of CAD s/p CABG 2007, Non-hodgkins lymphoma, & HLD.  He has been followed in the past by Dr. Doreatha Lew. He had a 4V CABG in 2007. Last cath February 2010 with occluded SVG to Diagonal and occluded SVG to PDA/PLA withpatent LIMA graft to the  LAD. There was diffuse disease in distal vessels felt best to be managed medically. Last echo 10/31/13 with normal LV function. 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   Just seen back here a month ago by Dr. Angelena Form and was felt to be doing ok.   Phone call today - "Received call transferred directly from operator and spoke with pt. He reports shortness of breath for last month.  Did not discuss at office visit in February with Dr. Angelena Form.  Thought originally he had a cold. He states shortness of breath is worsening.  Also feels like he gets warm at night sometimes. No fever. Shortness of breath is with exertion. Noticed after walking for 10 minutes on treadmill today.  Shortness of breath improves with rest.  No chest pain.  I scheduled him to see Truitt Merle, NP today at 2:30".   Comes in today. Here with his wife. Multiple issues. Short of breath. "Bad short of breath". Did not tell Dr. Angelena Form at his visit last month - "thought it would go away". Seems to be getting worse. Upset that he cannot get back to walking 3 miles a day - can only do 2 miles. Fatigues easily. Has had some chronic buttock pain (for over 30 years) as well as some pain in his head - got sent to pain management - now on  gabapentin, Cymbalta, muscle relaxer's and Ultram. Has been on these medicines about a month.  He feels "warm" at night. He uses an "oxygenator" at home - this is to help him feel "refreshed". He does not feel refreshed - he is tired. No chest pain. Has gained weight over the past 6 months.   Past Medical History:  Diagnosis Date  . Buttock pain   . CAD (coronary artery disease)    Last cath 2010 per Dr. Doreatha Lew with diffuse multivessel disease, small caliber vessels not felt to be suitable for PCI  . Dizziness    1 episode   . Fibromyalgia   . Head pain   . HLD (hyperlipidemia)   . Hypertension   . Myocardial infarction   . Non Hodgkin's lymphoma [facility])     Past Surgical History:  Procedure Laterality Date  . Baker cyst removal    . CHOLECYSTECTOMY    . CORONARY ARTERY BYPASS GRAFT  5/07   x5. LV dysfunction.   Marland Kitchen MASS EXCISION N/A 11/11/2015   Procedure: EXCISION OF MID CHEST  MASS;  Surgeon: Judeth Horn, MD;  Location: [place];  Service: General;  Laterality: N/A;  . MASS EXCISION N/A 12/23/2015   Procedure: REEXCISION OF CHEST WALL TUMOR SITE;  Surgeon: Judeth Horn,  MD;  Location: [place];  Service: General;  Laterality: N/A;  . TONSILLECTOMY       Medications: Current Outpatient Prescriptions  Medication Sig Dispense Refill  . aspirin EC 81 MG tablet Take 81 mg by mouth daily.    . cholecalciferol (VITAMIN D) 400 UNITS TABS Take 400 Units by mouth daily.    Marland Kitchen co-enzyme Q-10 30 MG capsule Take 100 mg by mouth daily.     . DULoxetine (CYMBALTA) 60 MG capsule Take 1 capsule (60 mg total) by mouth daily. 90 capsule 3  . fish oil-omega-3 fatty acids 1000 MG capsule Take 2 g by mouth daily.      Marland Kitchen gabapentin (NEURONTIN) 300 MG capsule Take 300 mg by mouth 3 (three) times daily.   12  . metoprolol succinate (TOPROL XL) 25 MG 24 hr tablet Take 1 tablet (25 mg total) by mouth daily. 90 tablet 3  . Multiple Vitamin (MULTIVITAMIN) capsule Take 1  capsule by mouth daily.      . nitroGLYCERIN (NITROSTAT) 0.4 MG SL tablet Place 1 tablet (0.4 mg total) under the tongue every 5 (five) minutes as needed for chest pain. 25 tablet 6  . ondansetron (ZOFRAN ODT) 4 MG disintegrating tablet 4mg  ODT q4 hours prn nausea/vomit 4 tablet 0  . rosuvastatin (CRESTOR) 40 MG tablet TAKE 1 TABLET BY MOUTH EVERY DAY 90 tablet 2  . tiZANidine (ZANAFLEX) 2 MG tablet Take 1 tablet (2 mg total) by mouth 3 (three) times daily. 90 tablet 6  . traMADol (ULTRAM) 50 MG tablet Take 50 mg by mouth 3 (three) times daily.     Marland Kitchen triamcinolone (NASACORT) 55 MCG/ACT AERO nasal inhaler Place 1 spray into the nose daily. 1 Inhaler 12   No current facility-administered medications for this visit.     Allergies: Allergies  Allergen Reactions  . Niacin And Related Nausea And Vomiting  . Niaspan [Niacin Er]     Stomach intolerance    Social History: The patient  reports that he has quit smoking. He has never used smokeless tobacco. He reports that he does not drink alcohol or use drugs.   Family History: The patient's family history includes Dementia in his mother; Heart attack in his father; Heart disease in his father.   Review of Systems: Please see the history of present illness.   Otherwise, the review of systems is positive for none.   All other systems are reviewed and negative.   Physical Exam: VS:  BP 132/66   Pulse 78   Ht 5\' 11"  (1.803 m)   Wt 195 lb 6.4 oz (88.6 kg)   SpO2 96% Comment: at rest/walking 90  BMI 27.25 kg/m  .  BMI Body mass index is 27.25 kg/m.  Wt Readings from Last 3 Encounters:  04/29/16 195 lb 6.4 oz (88.6 kg)  04/03/16 196 lb 6.4 oz (89.1 kg)  12/23/15 186 lb (84.4 kg)    General: Pleasant. Elderly male who is alert and in no acute distress.   HEENT: Normal.  Neck: Supple, no JVD, carotid bruits, or masses noted.  Cardiac: Regular rate and rhythm. No murmurs, rubs, or gallops. No edema.  Respiratory:  Lungs are clear to  auscultation bilaterally with normal work of breathing.  GI: Soft and nontender.  MS: No deformity or atrophy. Gait and ROM intact.  Skin: Warm and dry. Color is normal.  Neuro:  Strength and sensation are intact and no gross focal deficits noted.  Psych: Alert, appropriate and  with normal affect.   LABORATORY DATA:  EKG:  EKG is not ordered today. EKG from last month was ok.   Lab Results  Component Value Date   WBC 11.9 (H) 12/20/2015   HGB 13.6 12/23/2015   HCT 40.0 12/23/2015   PLT 292 12/20/2015   GLUCOSE 101 (H) 12/23/2015   CHOL 181 05/02/2012   TRIG 59.0 05/02/2012   HDL 45.70 05/02/2012   LDLDIRECT 198.4 08/21/2010   LDLCALC 124 (H) 05/02/2012   ALT 19 01/24/2013   AST 23 01/24/2013   NA 139 12/23/2015   K 4.1 12/23/2015   CL 105 12/23/2015   CREATININE 0.90 12/23/2015   BUN 23 (H) 12/23/2015   CO2 25 12/20/2015   TSH 3.150 12/03/2015   INR 1.09 01/24/2013    BNP (last 3 results) No results for input(s): BNP in the last 8760 hours.  ProBNP (last 3 results) No results for input(s): PROBNP in the last 8760 hours.   Other Studies Reviewed Today:  Cardiac cath February 2010: CORONARY ARTERIES: 1. Left main coronary artery has mild irregularities, but no significant focal stenosis.  The left anterior descending has a 70% stenosis proximally. There arises a moderate diagonal vessel that has a 90% stenosis. Further down the left anterior descending, there is a 95% focal stenosis. There is a diagonal vessel that fills by bidirectional flow as well as bidirectional flow into the distal left anterior descending with internal mammary graft.  Left circumflex. Left circumflex bifurcates almost at its ostium. The obtuse marginal was a previously bypass graft and it has 30-40% narrowing. It has good distal runoff. The circumflex branch proceeds in the AV groove has an ostial 70% stenosis.  The right coronary  artery is a large dominant vessel. There is a 30-40% scattered narrowing before the crux and 50% narrowing just before the crux. There is a 70% ostial narrowing in the second posterior descending and a 95% stenosis in the midportion of the small vessels. There is 30-40% stenosis in the first posterior descending. There is 80-90% stenosis in ostial portion of the small RV branch.  Saphenous vein graft to posterior descending and posterolateral branch is occluded proximally.  Saphenous vein graft to the intermediate diagonal vessel was occluded proximally.  The left internal mammary artery to the LAD is patent with excellent flow.  Echo 10/31/13: Left ventricle: The cavity size was normal. Wall thickness was normal. LV mid ventricular false tendon. Systolic function was normal. The estimated ejection fraction was in the range of 55% to 60%. Mild inferior hypokinesis. Doppler parameters are consistent with abnormal left ventricular relaxation (grade 1 diastolic dysfunction). The E/e&' ratio is <8, suggesting normal LV filling pressure. - Mitral valve: Mildly thickened leaflets . There was trivial regurgitation. - Left atrium: The atrium was at the upper limits of normal in size. - Right atrium: The atrium was at the upper limits of normal in size.  Impressions:  - Compared to the prior echo in 2012, the EF is slightly higher at 55-60%, there is mild inferior hypokinesis. Diastolic dysfunction is present with normal LV filling pressure.   Assessment/Plan:  1. Shortness of breath - with exertion - and fatigue - most likely multifactorial. He is on multiple sedating agents - I think this is driving some of his symptoms. Will get his echo update. Check labs to include BNP. He is going to try and cut back chronic pain medicines. Further disposition to follow - may need to consider stress testing or even PFTs -  was remote smoker.   2. CAD without angina: No chest pain suggestive of angina.  He is known to have diffuse CAD with one patent bypass graft by cardiac cath in 2010. Will continue ASA, statin, beta blocker.   3. Hyperlipidemia: Continue statin. Lipids followed in primary care.   4. Chronic pain syndrome - followed in pain management.    Current medicines are reviewed with the patient today.  The patient does not have concerns regarding medicines other than what has been noted above.  The following changes have been made:  See above.  Labs/ tests ordered today include:   No orders of the defined types were placed in this encounter.    Disposition:   Further disposition pending.    Patient is agreeable to this plan and will call if any problems develop in the interim.   SignedTruitt Merle, NP  04/29/2016 2:47 PM  [place] [facility] [address] [place],   [address] Phone: [phone] Fax: [phone]

## 2016-04-29 NOTE — Patient Instructions (Signed)
We will be checking the following labs today - BMET, CBC, BNP   Medication Instructions:    Continue with your current medicines.     Testing/Procedures To Be Arranged:  Echocardiogram  Follow-Up:   Will see how your studies turn out and then decide about follow up    Other Special Instructions:   N/A    If you need a refill on your cardiac medications before your next appointment, please call your pharmacy.   Call the [place] office at [phone] if you have any questions, problems or concerns.

## 2016-04-29 NOTE — Telephone Encounter (Signed)
Received call transferred directly from operator and spoke with pt. He reports shortness of breath for last month.  Did not discuss at office visit in February with Dr. Angelena Form.  Thought originally he had a cold. He states shortness of breath is worsening.  Also feels like he gets warm at night sometimes. No fever. Shortness of breath is with exertion. Noticed after walking for 10 minutes on treadmill today.  Shortness of breath improves with rest.  No chest pain.  I scheduled him to see Truitt Merle, NP today at 2:30.

## 2016-04-30 LAB — CBC
Hematocrit: 39.4 % (ref 37.5–51.0)
Hemoglobin: 13.2 g/dL (ref 13.0–17.7)
MCH: 29.7 pg (ref 26.6–33.0)
MCHC: 33.5 g/dL (ref 31.5–35.7)
MCV: 89 fL (ref 79–97)
Platelets: 272 10*3/uL (ref 150–379)
RBC: 4.44 x10E6/uL (ref 4.14–5.80)
RDW: 14.1 % (ref 12.3–15.4)
WBC: 11.6 10*3/uL — ABNORMAL HIGH (ref 3.4–10.8)

## 2016-04-30 LAB — BASIC METABOLIC PANEL
BUN/Creatinine Ratio: 24 (ref 10–24)
BUN: 28 mg/dL — ABNORMAL HIGH (ref 8–27)
CO2: 22 mmol/L (ref 18–29)
Calcium: 9.4 mg/dL (ref 8.6–10.2)
Chloride: 101 mmol/L (ref 96–106)
Creatinine, Ser: 1.17 mg/dL (ref 0.76–1.27)
GFR calc Af Amer: 72 mL/min/{1.73_m2} (ref >59)
GFR calc non Af Amer: 62 mL/min/{1.73_m2} (ref >59)
Glucose: 118 mg/dL — ABNORMAL HIGH (ref 65–99)
Potassium: 4.8 mmol/L (ref 3.5–5.2)
Sodium: 139 mmol/L (ref 134–144)

## 2016-04-30 LAB — PRO B NATRIURETIC PEPTIDE: NT-Pro BNP: 217 pg/mL (ref 0–376)

## 2016-05-13 ENCOUNTER — Other Ambulatory Visit (HOSPITAL_COMMUNITY): Payer: Medicare HMO

## 2016-05-14 DIAGNOSIS — R0902 Hypoxemia: Secondary | ICD-10-CM | POA: Diagnosis not present

## 2016-05-18 ENCOUNTER — Other Ambulatory Visit: Payer: Self-pay

## 2016-05-18 ENCOUNTER — Ambulatory Visit (HOSPITAL_COMMUNITY): Payer: Medicare HMO | Attending: Cardiovascular Disease

## 2016-05-18 ENCOUNTER — Other Ambulatory Visit: Payer: Self-pay | Admitting: *Deleted

## 2016-05-18 DIAGNOSIS — I519 Heart disease, unspecified: Secondary | ICD-10-CM

## 2016-05-18 DIAGNOSIS — R0602 Shortness of breath: Secondary | ICD-10-CM | POA: Diagnosis not present

## 2016-05-18 DIAGNOSIS — E785 Hyperlipidemia, unspecified: Secondary | ICD-10-CM | POA: Insufficient documentation

## 2016-05-18 DIAGNOSIS — I2510 Atherosclerotic heart disease of native coronary artery without angina pectoris: Secondary | ICD-10-CM | POA: Insufficient documentation

## 2016-05-18 DIAGNOSIS — I252 Old myocardial infarction: Secondary | ICD-10-CM | POA: Diagnosis not present

## 2016-05-18 DIAGNOSIS — Z951 Presence of aortocoronary bypass graft: Secondary | ICD-10-CM | POA: Diagnosis not present

## 2016-05-18 DIAGNOSIS — I10 Essential (primary) hypertension: Secondary | ICD-10-CM | POA: Insufficient documentation

## 2016-05-18 LAB — ECHOCARDIOGRAM COMPLETE
E decel time: 183 msec
FS: 14 % — AB (ref 28–44)
IVS/LV PW RATIO, ED: 1.15
LA ID, A-P, ES: 31 mm
LA diam end sys: 31 mm
LA diam index: 1.46 cm/m2
LA vol A4C: 36.1 ml
LA vol index: 21.9 mL/m2
LA vol: 46.5 mL
LV PW d: 11 mm — AB (ref 0.6–1.1)
LVOT SV: 58 mL
LVOT VTI: 16.8 cm
LVOT area: 3.46 cm2
LVOT diameter: 21 mm
LVOT peak vel: 68.7 cm/s
Lateral S' vel: 9.79 cm/s
MV Dec: 183
MV pk A vel: 70.8 m/s
MV pk E vel: 43.7 m/s
Peak grad: 202 mmHg
RV sys press: 19 mmHg
Reg peak vel: 202 cm/s
TAPSE: 11.2 mm
TR max vel: 202 cm/s

## 2016-05-21 ENCOUNTER — Telehealth (HOSPITAL_COMMUNITY): Payer: Self-pay | Admitting: *Deleted

## 2016-05-21 NOTE — Telephone Encounter (Signed)
Patient given detailed instructions per Myocardial Perfusion Study Information Sheet for the test on 05/25/16 Patient notified to arrive 15 minutes early and that it is imperative to arrive on time for appointment to keep from having the test rescheduled.  If you need to cancel or reschedule your appointment, please call the office within 24 hours of your appointment. Failure to do so may result in a cancellation of your appointment, and a $50 no show fee. Patient verbalized understanding. Winfield Caba Jacqueline    

## 2016-05-22 ENCOUNTER — Telehealth: Payer: Self-pay | Admitting: Cardiovascular Disease

## 2016-05-22 NOTE — Telephone Encounter (Signed)
I spoke with pt. He has a bad cold. Thought it was improving but today feels worse. Coughing and nasal drainage. Is scheduled for exercise myoview on Monday and would like to move this to later in the week.  Myoview rescheduled to April 19,2018 at 7:30

## 2016-05-22 NOTE — Telephone Encounter (Signed)
New message    Pt states he is returning a phone call from [facility]

## 2016-05-25 ENCOUNTER — Encounter (HOSPITAL_COMMUNITY): Payer: Medicare HMO

## 2016-05-25 DIAGNOSIS — M47817 Spondylosis without myelopathy or radiculopathy, lumbosacral region: Secondary | ICD-10-CM | POA: Diagnosis not present

## 2016-05-25 DIAGNOSIS — M25552 Pain in left hip: Secondary | ICD-10-CM | POA: Diagnosis not present

## 2016-05-25 DIAGNOSIS — M791 Myalgia: Secondary | ICD-10-CM | POA: Diagnosis not present

## 2016-05-25 DIAGNOSIS — M7072 Other bursitis of hip, left hip: Secondary | ICD-10-CM | POA: Diagnosis not present

## 2016-05-27 ENCOUNTER — Telehealth (HOSPITAL_COMMUNITY): Payer: Self-pay | Admitting: Cardiovascular Disease

## 2016-05-27 ENCOUNTER — Telehealth: Payer: Self-pay | Admitting: Cardiovascular Disease

## 2016-05-27 NOTE — Telephone Encounter (Signed)
We can reschedule his stress test until he is over the cold/sinus issues. Joseph Soto

## 2016-05-27 NOTE — Telephone Encounter (Signed)
Spoke with pt he states that someone from  this office already called him and answer his questions.

## 2016-05-27 NOTE — Telephone Encounter (Signed)
New message    Pt is calling concerning test for tomorrow.

## 2016-05-27 NOTE — Telephone Encounter (Signed)
-----   Message from Jeanie Sewer sent at 05/27/2016  1:42 PM EDT ----- Regarding: RE: cancel myoview for tomorrow 4/19 and reschedule this per Teresa Pelton,  Rollene Fare is the scheduler for [place] I have [place] her in this message so she can take care of the patient.   Davy Pique   ----- Message ----- From: Nuala Alpha, LPN Sent: 8/75/6433  12:31 PM To: [facility] Subject: cancel myoview for tomorrow 4/19 and resched#  Notified the pt that per Dr Angelena Form, we can reschedule his stress test until he is over the cold/sinus issues.   Pt is scheduled to have his exercise myoview done tomorrow 4/19.   Informed the pt that someone from [facility] scheduling will be calling him in the near future, to reschedule his exercise myoview appt.   Can you cancel myoview for tomorrow 4/19 and reschedule this, d/t pt being sick/sinus infection?  Send Enis Slipper RN the rescheduled date for his exercise myoview.   Thanks so much,   [facility]

## 2016-05-27 NOTE — Telephone Encounter (Signed)
Notified the pt that per Dr Angelena Form, we can reschedule his stress test until he is over the cold/sinus issues.  Informed the pt that someone from [facility] scheduling will be calling him in the near future, to reschedule his exercise myoview appt.  Pt verbalized understanding and agrees with this plan.

## 2016-05-27 NOTE — Telephone Encounter (Signed)
Pt calling in to inform Dr Angelena Form and RN that he is still experiencing his cold like symptoms, productive cough, nasal drainage, since last week.  Pt states he was to have a exercise myoview done last week and rescheduled this to tomorrow 4/19, for he had a cold then.  Pt states he is getting slightly better, but still has his cold symptoms, and wanted to know if it would be safe for him to cancel his myoview scheduled for tomorrow, or is this contraindicated from a cardiac perspective.  Pt states that he wants to make a safe decision for his heart, but still doesn't feel good from his sinus infection/cold.  Pt states he is afebrile. Pt would like for Dr Angelena Form to advise if he should just come in for scheduled myoview tomorrow, or should he reschedule.  Pt states he doesn't know the urgency of test needed, and if this is safe to delay again.  Reviewed Cecille Rubin Gerhardt's assessment and plan on this pt, and why she ordered the stress test.  Echo was ordered and completed, and noted he did have reduced EF from prior echo.  Informed the pt that Dr Angelena Form is out of the office today, as well as Fraser Din.  Did inform the pt that Dr Angelena Form is working in the hospital, so I will route this message to him for further review and recommendation, and a nurse in triage will follow-up with him shortly thereafter.  Informed the pt that if Dr Angelena Form is unable to review this message, then a triage Nurse will follow-up later with our DOD Dr Rayann Heman , for further recommendations.   Pt verbalized understanding and agrees with this plan.

## 2016-05-28 ENCOUNTER — Encounter (HOSPITAL_COMMUNITY): Payer: Medicare HMO

## 2016-05-29 DIAGNOSIS — M25552 Pain in left hip: Secondary | ICD-10-CM | POA: Diagnosis not present

## 2016-05-29 DIAGNOSIS — M791 Myalgia: Secondary | ICD-10-CM | POA: Diagnosis not present

## 2016-05-29 DIAGNOSIS — M7072 Other bursitis of hip, left hip: Secondary | ICD-10-CM | POA: Diagnosis not present

## 2016-06-01 ENCOUNTER — Other Ambulatory Visit: Payer: Self-pay | Admitting: *Deleted

## 2016-06-01 MED ORDER — TIZANIDINE HCL 2 MG PO TABS: 2.0000 mg | ORAL_TABLET | Freq: Three times a day (TID) | ORAL | 1 refills | Status: DC

## 2016-06-02 ENCOUNTER — Telehealth (HOSPITAL_COMMUNITY): Payer: Self-pay | Admitting: *Deleted

## 2016-06-02 NOTE — Telephone Encounter (Signed)
Patient given detailed instructions per Myocardial Perfusion Study Information Sheet for the test on 06/04/16 at 0730. Patient notified to arrive 15 minutes early and that it is imperative to arrive on time for appointment to keep from having the test rescheduled.  If you need to cancel or reschedule your appointment, please call the office within 24 hours of your appointment. Failure to do so may result in a cancellation of your appointment, and a $50 no show fee. Patient verbalized understanding.Daila Elbert, Ranae Palms

## 2016-06-02 NOTE — Telephone Encounter (Signed)
  05/27/2016 03:24 PM Phone (Outgoing) [place], [name] (Self) [phone] (H)   Left Message - Called pt and lmsg for him to CB to get rescheduled for him myoview due to him feeling under the weather.     By Verdene Rio           Close PreviousNext

## 2016-06-04 ENCOUNTER — Ambulatory Visit (HOSPITAL_COMMUNITY): Payer: Medicare HMO | Attending: Cardiovascular Disease

## 2016-06-04 DIAGNOSIS — I519 Heart disease, unspecified: Secondary | ICD-10-CM

## 2016-06-04 DIAGNOSIS — Z951 Presence of aortocoronary bypass graft: Secondary | ICD-10-CM | POA: Insufficient documentation

## 2016-06-04 DIAGNOSIS — R0602 Shortness of breath: Secondary | ICD-10-CM | POA: Insufficient documentation

## 2016-06-04 DIAGNOSIS — R9439 Abnormal result of other cardiovascular function study: Secondary | ICD-10-CM | POA: Diagnosis not present

## 2016-06-04 DIAGNOSIS — I2510 Atherosclerotic heart disease of native coronary artery without angina pectoris: Secondary | ICD-10-CM | POA: Insufficient documentation

## 2016-06-04 LAB — MYOCARDIAL PERFUSION IMAGING
Estimated workload: 11.3 METS
Exercise duration (min): 9 min
Exercise duration (sec): 46 s
LV dias vol: 91 mL (ref 62–150)
LV sys vol: 48 mL
MPHR: 148 {beats}/min
Peak HR: 137 {beats}/min
Percent HR: 93 %
RATE: 0.22
RPE: 18
Rest HR: 65 {beats}/min
SDS: 9
SRS: 9
SSS: 17
TID: 0.73

## 2016-06-04 MED ORDER — TECHNETIUM TC 99M TETROFOSMIN IV KIT
10.5000 | PACK | Freq: Once | INTRAVENOUS | Status: AC | PRN
  Administered 2016-06-04: 10.5 via INTRAVENOUS
  Filled 2016-06-04: qty 11

## 2016-06-04 MED ORDER — TECHNETIUM TC 99M TETROFOSMIN IV KIT
33.0000 | PACK | Freq: Once | INTRAVENOUS | Status: AC | PRN
  Administered 2016-06-04: 33 via INTRAVENOUS
  Filled 2016-06-04: qty 33

## 2016-06-08 ENCOUNTER — Telehealth: Payer: Self-pay | Admitting: *Deleted

## 2016-06-08 NOTE — Progress Notes (Signed)
CARDIOLOGY OFFICE NOTE  Date:  06/09/2016    Joseph Soto Date of Birth: 30-Oct-1945 Medical Record #[id number]  PCP:  Joseph Lopes, MD  Cardiologist:  Joseph Soto    Chief Complaint  Patient presents with  . Follow-up    Work in visit to discuss test results - seen for Dr. Angelena Soto    History of Present Illness: Joseph Soto is a 71 y.o. male who presents today for a follow up visit to discuss his test results.  Seen for Dr. Angelena Soto.   He has a history of CAD s/p CABG 2007, Non-hodgkins lymphoma, & HLD.  He has been followed in the past by Dr. Doreatha Soto. He had a 4V CABG in 2007. Last cath February 2010 with occluded SVG to Diagonal and occluded SVG to PDA/PLAwithpatent LIMA graft to the LAD. There was diffuse disease in distal vessels felt best to be managed medically. Last echo 10/31/13 with normal LV function. 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   Seen here back in February by Dr. Angelena Soto and was felt to be doing ok.   Then called about a month later with shortness of breath. I ended up seeing him. He was on a multitude of sedating drugs. Echo updated - some fall in the EF - Myoview then arranged - now read as intermediate. BNP normal on his labs. To refer for cardiac cath.    Comes in today. Here with his wife. He is weaning some of his sedatives that he is on for chronic pain. No appetite - weight is down. Still short of breath. While he says this is some better - still "not right" and he feels "like something is wrong". No actual chest pain. Anxious to proceed on with further evaluation.   Past Medical History:  Diagnosis Date  . Buttock pain   . CAD (coronary artery disease)    Last cath 2010 per Dr. Doreatha Soto with diffuse multivessel disease, small caliber vessels not felt to be suitable for PCI  . Dizziness    1 episode   . Fibromyalgia   . Head pain   . HLD (hyperlipidemia)   . Hypertension   .  Myocardial infarction ([place])   . Non Hodgkin's lymphoma [facility])     Past Surgical History:  Procedure Laterality Date  . Baker cyst removal    . CHOLECYSTECTOMY    . CORONARY ARTERY BYPASS GRAFT  5/07   x5. LV dysfunction.   Marland Kitchen MASS EXCISION N/A 11/11/2015   Procedure: EXCISION OF MID CHEST  MASS;  Surgeon: Judeth Horn, MD;  Location: [place];  Service: General;  Laterality: N/A;  . MASS EXCISION N/A 12/23/2015   Procedure: REEXCISION OF CHEST WALL TUMOR SITE;  Surgeon: Judeth Horn, MD;  Location: [place];  Service: General;  Laterality: N/A;  . TONSILLECTOMY       Medications: Current Outpatient Prescriptions  Medication Sig Dispense Refill  . aspirin EC 81 MG tablet Take 81 mg by mouth daily.    . cholecalciferol (VITAMIN D) 400 UNITS TABS Take 400 Units by mouth daily.    Marland Kitchen co-enzyme Q-10 30 MG capsule Take 100 mg by mouth daily.     . DULoxetine (CYMBALTA) 60 MG capsule Take 1 capsule (60 mg total) by mouth daily. (Patient taking differently: Take 60 mg by mouth daily. Weaning off down to 5 pill weekly) 90 capsule 3  . fish oil-omega-3 fatty  acids 1000 MG capsule Take 2 g by mouth daily.      Marland Kitchen gabapentin (NEURONTIN) 300 MG capsule Take 300 mg by mouth 3 (three) times daily.   12  . metoprolol succinate (TOPROL XL) 25 MG 24 hr tablet Take 1 tablet (25 mg total) by mouth daily. 90 tablet 3  . Multiple Vitamin (MULTIVITAMIN) capsule Take 1 capsule by mouth daily.      . nitroGLYCERIN (NITROSTAT) 0.4 MG SL tablet Place 1 tablet (0.4 mg total) under the tongue every 5 (five) minutes as needed for chest pain. 25 tablet 6  . ondansetron (ZOFRAN ODT) 4 MG disintegrating tablet 4mg  ODT q4 hours prn nausea/vomit 4 tablet 0  . rosuvastatin (CRESTOR) 40 MG tablet TAKE 1 TABLET BY MOUTH EVERY DAY 90 tablet 2  . tiZANidine (ZANAFLEX) 2 MG tablet Take 1 tablet (2 mg total) by mouth 3 (three) times daily. 270 tablet 1  . traMADol (ULTRAM) 50 MG tablet Take 50 mg  by mouth 2 (two) times daily.     Marland Kitchen triamcinolone (NASACORT) 55 MCG/ACT AERO nasal inhaler Place 1 spray into the nose daily. 1 Inhaler 12   No current facility-administered medications for this visit.     Allergies: Allergies  Allergen Reactions  . Niacin And Related Nausea And Vomiting  . Niaspan [Niacin Er]     Stomach intolerance    Social History: The patient  reports that he has quit smoking. He has never used smokeless tobacco. He reports that he does not drink alcohol or use drugs.   Family History: The patient's family history includes Dementia in his mother; Heart attack in his father; Heart disease in his father.   Review of Systems: Please see the history of present illness.   Otherwise, the review of systems is positive for none.   All other systems are reviewed and negative.   Physical Exam: VS:  BP 122/80 (BP Location: Left Arm, Patient Position: Sitting, Cuff Size: Normal)   Pulse 87   Ht 5' 11.5" (1.816 m)   Wt 189 lb (85.7 kg)   SpO2 97% Comment: at rest  BMI 25.99 kg/m  .  BMI Body mass index is 25.99 kg/m.  Wt Readings from Last 3 Encounters:  06/09/16 189 lb (85.7 kg)  04/29/16 195 lb 6.4 oz (88.6 kg)  04/03/16 196 lb 6.4 oz (89.1 kg)    General: Pleasant. Well developed, well nourished and in no acute distress. He has lost weight.   HEENT: Normal.  Neck: Supple, no JVD, carotid bruits, or masses noted.  Cardiac: Regular rate and rhythm. No murmurs, rubs, or gallops. No edema.  Respiratory:  Lungs are clear to auscultation bilaterally with normal work of breathing.  GI: Soft and nontender.  MS: No deformity or atrophy. Gait and ROM intact.  Skin: Warm and dry. Color is normal.  Neuro:  Strength and sensation are intact and no gross focal deficits noted.  Psych: Alert, appropriate and with normal affect.   LABORATORY DATA:  EKG:  EKG is not ordered today.  Lab Results  Component Value Date   WBC 11.6 (H) 04/29/2016   HGB 13.6 12/23/2015    HCT 39.4 04/29/2016   PLT 272 04/29/2016   GLUCOSE 118 (H) 04/29/2016   CHOL 181 05/02/2012   TRIG 59.0 05/02/2012   HDL 45.70 05/02/2012   LDLDIRECT 198.4 08/21/2010   LDLCALC 124 (H) 05/02/2012   ALT 19 01/24/2013   AST 23 01/24/2013   NA 139 04/29/2016  K 4.8 04/29/2016   CL 101 04/29/2016   CREATININE 1.17 04/29/2016   BUN 28 (H) 04/29/2016   CO2 22 04/29/2016   TSH 3.150 12/03/2015   INR 1.09 01/24/2013    BNP (last 3 results) No results for input(s): BNP in the last 8760 hours.  ProBNP (last 3 results)  Recent Labs  04/29/16 1504  PROBNP 217     Other Studies Reviewed Today:  Myoview Study Highlights 05/2016   The left ventricular ejection fraction is mildly decreased (45-54%).  Nuclear stress EF: 47%.  Blood pressure demonstrated a hypertensive response to exercise.  Wandering baseline artifact made EKG interpretation difficult but overall there was 39mm of J point depression with upsloping ST segment depression at peak exercise and immediate recovery,  There were frequent PACs and PVCs as well as PACs with aberration.  There is a small defect of moderate severity present in the basal inferoseptal and mid inferoseptal location. The defect is non-reversible and worse on resting images and consistent with infarct vs. diaphragmatic attenuation artifact.  There is a medium defect of severe severity present in the basal inferior, mid inferior and apical inferior location. The defect is partially reversible and represents variations in diaphragmatic attenuation artifact vs. Infarct with peri infarct ischemia. .  This is an intermediate risk study.      Echo Study Conclusions 05/2017  - Left ventricle: The cavity size was normal. There was mild focal   basal hypertrophy of the septum. Systolic function was mildly to   moderately reduced. The estimated ejection fraction was in the   range of 40% to 45%. Hypokinesis of the basal-mid anteroseptal   and  anterior myocardium; consistent with ischemia in the   distribution of the left anterior descending coronary artery,   with a patent distal bypass. Doppler parameters are consistent   with abnormal left ventricular relaxation (grade 1 diastolic   dysfunction). - Ventricular septum: Septal motion showed paradox. - Right ventricle: Systolic function was mildly reduced.   Cardiac cath February 2010:  CORONARY ARTERIES: 1. Left main coronary artery has mild irregularities, but no significant focal stenosis.  The left anterior descending has a 70% stenosis proximally. There arises a moderate diagonal vessel that has a 90% stenosis. Further down the left anterior descending, there is a 95% focal stenosis. There is a diagonal vessel that fills by bidirectional flow as well as bidirectional flow into the distal left anterior descending with internal mammary graft. Left circumflex. Left circumflex bifurcates almost at its ostium. The obtuse marginal was a previously bypass graft and it has 30-40% narrowing. It has good distal runoff. The circumflex branch proceeds in the AV groove has an ostial 70% stenosis. The right coronary artery is a large dominant vessel. There is a 30-40% scattered narrowing before the crux and 50% narrowing just before the crux. There is a 70% ostial narrowing in the second posterior descending and a 95% stenosis in the midportion of the small vessels. There is 30-40% stenosis in the first posterior descending. There is 80-90% stenosis in ostial portion of the small RV branch.  Saphenous vein graft to posterior descending and posterolateral branch is occluded proximally.  Saphenous vein graft to the intermediate diagonal vessel was occluded proximally.  The left internal mammary artery to the LAD is patent with excellent flow.  Echo 10/31/13: Left ventricle:  The cavity size was normal. Wall thickness was normal. LV mid ventricular false tendon. Systolic function was normal. The estimated ejection fraction was in the  range of 55% to 60%. Mild inferior hypokinesis. Doppler parameters are consistent with abnormal left ventricular relaxation (grade 1 diastolic dysfunction). The E/e&' ratio is <8, suggesting normal LV filling pressure. - Mitral valve: Mildly thickened leaflets . There was trivial regurgitation. - Left atrium: The atrium was at the upper limits of normal in size. - Right atrium: The atrium was at the upper limits of normal in size.  Impressions:  - Compared to the prior echo in 2012, the EF is slightly higher at 55-60%, there is mild inferior hypokinesis. Diastolic dysfunction is present with normal LV filling pressure.   Assessment/Plan:  1. Shortness of breath - with exertion - and fatigue - most likely multifactorial. But echo has shown worsening of his EF. Myoview now read as intermediate risk. Discussed with Dr. Angelena Soto previously - will refer on for repeat cardiac catheterization - discussed at length. The patient understands that risks include but are not limited to stroke (1 in 1000), death (1 in 69), kidney failure [usually temporary] (1 in 500), bleeding (1 in 200), allergic reaction [possibly serious] (1 in 200), and agrees to proceed. Arranged for next Wednesday, May 9th at 8:30AM.   2. CAD without angina: No chest pain suggestive of angina.  He is known to have diffuse CAD with one patent bypass graft by cardiac cath in 2010. Now with intermediate Myoview and continued symptoms of shortness of breath - proceeding with cardiac cath  3. Hyperlipidemia: Continue statin. Lipids followed in primary care.   4. Chronic pain syndrome - followed in pain management.    Current medicines are reviewed with the patient today.  The patient does not have concerns regarding medicines other than what  has been noted above.  The following changes have been made:  See above.  Labs/ tests ordered today include:    Orders Placed This Encounter  Procedures  . Basic metabolic panel  . CBC  . APTT  . Protime-INR     Disposition:   Further disposition to follow. He is to keep his follow up with Dr. Angelena Soto. If his cardiac cath is stable, would consider referral to pulmonary.    Patient is agreeable to this plan and will call if any problems develop in the interim.   SignedTruitt Merle, NP  06/09/2016 10:35 AM  [place] [address] [place], [place]  [address] Phone: [phone] Fax: [phone]

## 2016-06-08 NOTE — Telephone Encounter (Signed)
s/w pt is coming in Tuesday, May 1 to disucss cath for abnormal stress test.

## 2016-06-09 ENCOUNTER — Ambulatory Visit (INDEPENDENT_AMBULATORY_CARE_PROVIDER_SITE_OTHER): Payer: Medicare HMO | Admitting: Nurse Practitioner

## 2016-06-09 ENCOUNTER — Encounter: Payer: Self-pay | Admitting: Nurse Practitioner

## 2016-06-09 ENCOUNTER — Other Ambulatory Visit: Payer: Self-pay | Admitting: Nurse Practitioner

## 2016-06-09 VITALS — BP 122/80 | HR 87 | Ht 71.5 in | Wt 189.0 lb

## 2016-06-09 DIAGNOSIS — R0602 Shortness of breath: Secondary | ICD-10-CM

## 2016-06-09 DIAGNOSIS — R9439 Abnormal result of other cardiovascular function study: Secondary | ICD-10-CM

## 2016-06-09 DIAGNOSIS — I25810 Atherosclerosis of coronary artery bypass graft(s) without angina pectoris: Secondary | ICD-10-CM

## 2016-06-09 NOTE — Patient Instructions (Signed)
We will be checking the following labs today - BMET, CBC, PT, PTT   Medication Instructions:    Continue with your current medicines.     Testing/Procedures To Be Arranged:  Cardiac catheterization  Follow-Up:   See Dr. Angelena Form as planned.    Other Special Instructions:   Your provider has recommended a cardiac catherization  You are scheduled for a cardiac catheterization on Wednesday, May 9th at 8:30 AM with Dr. Angelena Form or associate.  Please arrive at the [facility] (Main Entrance) at [facility] at [address], [place] Stay on Wednesday, May 9th at 6:30AM.    Special note: Every effort is made to have your procedure done on time.   Please understand that emergencies sometimes delay a scheduled   procedure.  No food or drink after midnight on Tuesday.  You may take your morning medications with a sip of water on the day of your procedure.  Medications to [place] for a one night stay -- bring personal belongings.  Bring a current list of your medications and current insurance cards.  You MUST have a responsible person to drive you home. Someone MUST be with you the first 24 hours after you arrive home or your discharge will be delayed. Wear clothes that are easy to get on and off and wear slip on shoes.    Coronary Angiogram A coronary angiogram, also called coronary angiography, is an X-ray procedure used to look at the arteries in the heart. In this procedure, a dye (contrast dye) is injected through a long, hollow tube (catheter). The catheter is about the size of a piece of cooked spaghetti and is inserted through your groin, wrist, or arm. The dye is injected into each artery, and X-rays are then taken to show if there is a blockage in the arteries of your heart.  LET [facility] CARE PROVIDER KNOW ABOUT: Any allergies you have, including allergies to shellfish or contrast dye.  All medicines you are taking,  including vitamins, herbs, eye drops, creams, and over-the-counter medicines.  Previous problems you or members of your family have had with the use of anesthetics.  Any blood disorders you have.  Previous surgeries you have had. History of kidney problems or failure.  Other medical conditions you have.  RISKS AND COMPLICATIONS  Generally, a coronary angiogram is a safe procedure. However, about 1 person out of 1000 can have problems that may include: Allergic reaction to the dye. Bleeding/bruising from the access site or other locations. Kidney injury, especially in people with impaired kidney function. Stroke (rare). Heart attack (rare). Irregular rhythms (rare) Death (rare)  BEFORE THE PROCEDURE  Do not eat or drink anything after midnight the night before the procedure or as directed by your health care provider.  Ask your health care provider about changing or stopping your regular medicines. This is especially important if you are taking diabetes medicines or blood thinners.  PROCEDURE You may be given a medicine to help you relax (sedative) before the procedure. This medicine is given through an intravenous (IV) access tube that is inserted into one of your veins.  The area where the catheter will be inserted will be washed and shaved. This is usually done in the groin but may be done in the fold of your arm (near your elbow) or in the wrist.  A medicine will be given to numb the area where the catheter will be inserted (local  anesthetic).  The health care provider will insert the catheter into an artery. The catheter will be guided by using a special type of X-ray (fluoroscopy) of the blood vessel being examined.  A special dye will then be injected into the catheter, and X-rays will be taken. The dye will help to show where any narrowing or blockages are located in the heart arteries.   AFTER THE PROCEDURE  If the procedure is done through the leg, you will be kept  in bed lying flat for several hours. You will be instructed to not bend or cross your legs. The insertion site will be checked frequently.  The pulse in your feet or wrist will be checked frequently.  Additional blood tests, X-rays, and an electrocardiogram may be done.      If you need a refill on your cardiac medications before your next appointment, please call your pharmacy.   Call the [place] office at [phone] if you have any questions, problems or concerns.

## 2016-06-10 ENCOUNTER — Telehealth: Payer: Self-pay | Admitting: Nurse Practitioner

## 2016-06-10 LAB — CBC
Hematocrit: 40.4 % (ref 37.5–51.0)
Hemoglobin: 13.3 g/dL (ref 13.0–17.7)
MCH: 29.6 pg (ref 26.6–33.0)
MCHC: 32.9 g/dL (ref 31.5–35.7)
MCV: 90 fL (ref 79–97)
Platelets: 298 10*3/uL (ref 150–379)
RBC: 4.49 x10E6/uL (ref 4.14–5.80)
RDW: 13.4 % (ref 12.3–15.4)
WBC: 13 10*3/uL — ABNORMAL HIGH (ref 3.4–10.8)

## 2016-06-10 LAB — BASIC METABOLIC PANEL
BUN/Creatinine Ratio: 25 — ABNORMAL HIGH (ref 10–24)
BUN: 28 mg/dL — ABNORMAL HIGH (ref 8–27)
CO2: 22 mmol/L (ref 18–29)
Calcium: 9.2 mg/dL (ref 8.6–10.2)
Chloride: 101 mmol/L (ref 96–106)
Creatinine, Ser: 1.14 mg/dL (ref 0.76–1.27)
GFR calc Af Amer: 74 mL/min/{1.73_m2} (ref >59)
GFR calc non Af Amer: 64 mL/min/{1.73_m2} (ref >59)
Glucose: 82 mg/dL (ref 65–99)
Potassium: 5 mmol/L (ref 3.5–5.2)
Sodium: 139 mmol/L (ref 134–144)

## 2016-06-10 LAB — PROTIME-INR
INR: 1 (ref 0.8–1.2)
Prothrombin Time: 10.4 s (ref 9.1–12.0)

## 2016-06-10 LAB — APTT: aPTT: 25 s (ref 24–33)

## 2016-06-10 NOTE — Telephone Encounter (Signed)
New message    Pt is calling returning call to [facility]

## 2016-06-13 DIAGNOSIS — R0902 Hypoxemia: Secondary | ICD-10-CM | POA: Diagnosis not present

## 2016-06-16 NOTE — Telephone Encounter (Signed)
New message    Pt is calling about his cath scheduled for tomorrow.

## 2016-06-16 NOTE — Telephone Encounter (Signed)
Called patient who is having a concern about his cath scheduled tomorrow. He did not know the instructions for his cath and wanted to know all the important information. Instructions went over with patient and the AVS that contains the cardiac catheterization procedure information and instructions was sent to patient via mychart. Patient verbalized understanding and knows where to go and when his cath will be performed. Advised patient to call back with any further questions and pt thanked me for sending him the instructions in [place].

## 2016-06-17 ENCOUNTER — Other Ambulatory Visit: Payer: Self-pay

## 2016-06-17 ENCOUNTER — Telehealth: Payer: Self-pay | Admitting: *Deleted

## 2016-06-17 ENCOUNTER — Encounter (HOSPITAL_COMMUNITY): Payer: Self-pay | Admitting: Cardiovascular Disease

## 2016-06-17 ENCOUNTER — Encounter (HOSPITAL_COMMUNITY)
Admission: RE | Disposition: A | Discharge status: Home / Self Care | Payer: Self-pay | Source: Ambulatory Visit | Attending: Cardiovascular Disease

## 2016-06-17 ENCOUNTER — Ambulatory Visit (HOSPITAL_COMMUNITY)
Admission: RE | Admit: 2016-06-17 | Discharge: 2016-06-18 | Disposition: A | Discharge status: Home / Self Care | Payer: Medicare HMO | Source: Ambulatory Visit | Attending: Cardiovascular Disease | Admitting: Cardiovascular Disease

## 2016-06-17 DIAGNOSIS — Z8249 Family history of ischemic heart disease and other diseases of the circulatory system: Secondary | ICD-10-CM | POA: Diagnosis not present

## 2016-06-17 DIAGNOSIS — I25710 Atherosclerosis of autologous vein coronary artery bypass graft(s) with unstable angina pectoris: Secondary | ICD-10-CM | POA: Diagnosis not present

## 2016-06-17 DIAGNOSIS — Z955 Presence of coronary angioplasty implant and graft: Secondary | ICD-10-CM

## 2016-06-17 DIAGNOSIS — I2582 Chronic total occlusion of coronary artery: Secondary | ICD-10-CM | POA: Diagnosis not present

## 2016-06-17 DIAGNOSIS — M797 Fibromyalgia: Secondary | ICD-10-CM | POA: Insufficient documentation

## 2016-06-17 DIAGNOSIS — E785 Hyperlipidemia, unspecified: Secondary | ICD-10-CM | POA: Diagnosis not present

## 2016-06-17 DIAGNOSIS — I255 Ischemic cardiomyopathy: Secondary | ICD-10-CM | POA: Diagnosis present

## 2016-06-17 DIAGNOSIS — I200 Unstable angina: Secondary | ICD-10-CM | POA: Diagnosis present

## 2016-06-17 DIAGNOSIS — G894 Chronic pain syndrome: Secondary | ICD-10-CM | POA: Insufficient documentation

## 2016-06-17 DIAGNOSIS — I252 Old myocardial infarction: Secondary | ICD-10-CM | POA: Diagnosis not present

## 2016-06-17 DIAGNOSIS — Z87891 Personal history of nicotine dependence: Secondary | ICD-10-CM | POA: Insufficient documentation

## 2016-06-17 DIAGNOSIS — I25110 Atherosclerotic heart disease of native coronary artery with unstable angina pectoris: Secondary | ICD-10-CM | POA: Diagnosis not present

## 2016-06-17 DIAGNOSIS — Z7982 Long term (current) use of aspirin: Secondary | ICD-10-CM | POA: Diagnosis not present

## 2016-06-17 DIAGNOSIS — I2584 Coronary atherosclerosis due to calcified coronary lesion: Secondary | ICD-10-CM | POA: Diagnosis not present

## 2016-06-17 DIAGNOSIS — Z7951 Long term (current) use of inhaled steroids: Secondary | ICD-10-CM | POA: Diagnosis not present

## 2016-06-17 DIAGNOSIS — I10 Essential (primary) hypertension: Secondary | ICD-10-CM | POA: Insufficient documentation

## 2016-06-17 DIAGNOSIS — C8590 Non-Hodgkin lymphoma, unspecified, unspecified site: Secondary | ICD-10-CM | POA: Insufficient documentation

## 2016-06-17 DIAGNOSIS — I2510 Atherosclerotic heart disease of native coronary artery without angina pectoris: Secondary | ICD-10-CM | POA: Diagnosis present

## 2016-06-17 HISTORY — DX: Other chronic pain: G89.29

## 2016-06-17 HISTORY — DX: Multiple myeloma not having achieved remission: C90.00

## 2016-06-17 HISTORY — DX: Gastro-esophageal reflux disease without esophagitis: K21.9

## 2016-06-17 HISTORY — PX: CORONARY ANGIOPLASTY WITH STENT PLACEMENT: SHX49

## 2016-06-17 HISTORY — PX: CORONARY STENT INTERVENTION: CATH118234

## 2016-06-17 HISTORY — PX: LEFT HEART CATH AND CORS/GRAFTS ANGIOGRAPHY: CATH118250

## 2016-06-17 HISTORY — DX: Pain in left hip: M25.552

## 2016-06-17 LAB — POCT ACTIVATED CLOTTING TIME
Activated Clotting Time: 213 seconds
Activated Clotting Time: 279 seconds
Activated Clotting Time: 422 seconds

## 2016-06-17 SURGERY — LEFT HEART CATH AND CORS/GRAFTS ANGIOGRAPHY
Anesthesia: LOCAL

## 2016-06-17 MED ORDER — SODIUM CHLORIDE 0.9 % IV SOLN: 250.0000 mL | INTRAVENOUS | Status: DC | PRN

## 2016-06-17 MED ORDER — FAMOTIDINE IN NACL 20-0.9 MG/50ML-% IV SOLN
INTRAVENOUS | Status: AC
  Filled 2016-06-17: qty 50

## 2016-06-17 MED ORDER — LABETALOL HCL 5 MG/ML IV SOLN: 10.0000 mg | INTRAVENOUS | Status: AC | PRN

## 2016-06-17 MED ORDER — ASPIRIN EC 81 MG PO TBEC: 81.0000 mg | DELAYED_RELEASE_TABLET | Freq: Every evening | ORAL | Status: DC

## 2016-06-17 MED ORDER — ONDANSETRON HCL 4 MG/2ML IJ SOLN: 4.0000 mg | Freq: Four times a day (QID) | INTRAMUSCULAR | Status: DC | PRN

## 2016-06-17 MED ORDER — FENTANYL CITRATE (PF) 100 MCG/2ML IJ SOLN
INTRAMUSCULAR | Status: AC
Start: 2016-06-17 — End: 2016-06-17
  Filled 2016-06-17: qty 2

## 2016-06-17 MED ORDER — FENTANYL CITRATE (PF) 100 MCG/2ML IJ SOLN
INTRAMUSCULAR | Status: DC | PRN
  Administered 2016-06-17: 25 ug via INTRAVENOUS

## 2016-06-17 MED ORDER — COENZYME Q10 300 MG PO CAPS: 300.0000 mg | ORAL_CAPSULE | Freq: Every day | ORAL | Status: DC

## 2016-06-17 MED ORDER — MIDAZOLAM HCL 2 MG/2ML IJ SOLN
INTRAMUSCULAR | Status: AC
  Filled 2016-06-17: qty 2

## 2016-06-17 MED ORDER — SODIUM CHLORIDE 0.9 % IV SOLN: INTRAVENOUS | Status: AC

## 2016-06-17 MED ORDER — VITAMIN D3 25 MCG (1000 UNIT) PO TABS
1000.0000 [IU] | ORAL_TABLET | Freq: Every day | ORAL | Status: DC
  Administered 2016-06-17: 16:00:00 1000 [IU] via ORAL
  Filled 2016-06-17 (×4): qty 1

## 2016-06-17 MED ORDER — ADULT MULTIVITAMIN W/MINERALS CH
1.0000 | ORAL_TABLET | Freq: Every day | ORAL | Status: DC
  Filled 2016-06-17: qty 1

## 2016-06-17 MED ORDER — TRAMADOL HCL 50 MG PO TABS: 50.0000 mg | ORAL_TABLET | Freq: Two times a day (BID) | ORAL | Status: DC

## 2016-06-17 MED ORDER — VERAPAMIL HCL 2.5 MG/ML IV SOLN
INTRAVENOUS | Status: AC
  Filled 2016-06-17: qty 2

## 2016-06-17 MED ORDER — ACETAMINOPHEN 325 MG PO TABS
650.0000 mg | ORAL_TABLET | ORAL | Status: DC | PRN
  Administered 2016-06-17: 21:00:00 650 mg via ORAL
  Filled 2016-06-17: qty 2

## 2016-06-17 MED ORDER — TRAMADOL HCL 50 MG PO TABS
50.0000 mg | ORAL_TABLET | Freq: Three times a day (TID) | ORAL | Status: DC
  Administered 2016-06-17 – 2016-06-18 (×3): 50 mg via ORAL
  Filled 2016-06-17 (×3): qty 1

## 2016-06-17 MED ORDER — SODIUM CHLORIDE 0.9% FLUSH: 3.0000 mL | INTRAVENOUS | Status: DC | PRN

## 2016-06-17 MED ORDER — HEPARIN SODIUM (PORCINE) 1000 UNIT/ML IJ SOLN
INTRAMUSCULAR | Status: DC | PRN
  Administered 2016-06-17: 5500 [IU] via INTRAVENOUS
  Administered 2016-06-17: 2000 [IU] via INTRAVENOUS
  Administered 2016-06-17: 3000 [IU] via INTRAVENOUS
  Administered 2016-06-17: 4500 [IU] via INTRAVENOUS

## 2016-06-17 MED ORDER — HEPARIN SODIUM (PORCINE) 1000 UNIT/ML IJ SOLN
INTRAMUSCULAR | Status: AC
  Filled 2016-06-17: qty 1

## 2016-06-17 MED ORDER — GABAPENTIN 300 MG PO CAPS
300.0000 mg | ORAL_CAPSULE | Freq: Three times a day (TID) | ORAL | Status: DC
  Administered 2016-06-17 – 2016-06-18 (×3): 300 mg via ORAL
  Filled 2016-06-17 (×3): qty 1

## 2016-06-17 MED ORDER — MIDAZOLAM HCL 2 MG/2ML IJ SOLN
INTRAMUSCULAR | Status: DC | PRN
  Administered 2016-06-17: 1 mg via INTRAVENOUS

## 2016-06-17 MED ORDER — ROSUVASTATIN CALCIUM 40 MG PO TABS
40.0000 mg | ORAL_TABLET | Freq: Every evening | ORAL | Status: DC
  Administered 2016-06-17: 40 mg via ORAL
  Filled 2016-06-17: qty 2
  Filled 2016-06-17: qty 1

## 2016-06-17 MED ORDER — GABAPENTIN 300 MG PO CAPS: 300.0000 mg | ORAL_CAPSULE | Freq: Two times a day (BID) | ORAL | Status: DC

## 2016-06-17 MED ORDER — DIAZEPAM 5 MG PO TABS
10.0000 mg | ORAL_TABLET | ORAL | Status: AC
  Administered 2016-06-17: 10 mg via ORAL

## 2016-06-17 MED ORDER — NITROGLYCERIN 0.4 MG SL SUBL: 0.4000 mg | SUBLINGUAL_TABLET | SUBLINGUAL | Status: DC | PRN

## 2016-06-17 MED ORDER — ASPIRIN 81 MG PO CHEW
81.0000 mg | CHEWABLE_TABLET | ORAL | Status: AC
  Administered 2016-06-17: 81 mg via ORAL

## 2016-06-17 MED ORDER — DIAZEPAM 5 MG PO TABS
ORAL_TABLET | ORAL | Status: AC
  Administered 2016-06-17: 10 mg via ORAL
  Filled 2016-06-17: qty 2

## 2016-06-17 MED ORDER — CLOPIDOGREL BISULFATE 300 MG PO TABS
ORAL_TABLET | ORAL | Status: DC | PRN
  Administered 2016-06-17: 600 mg via ORAL

## 2016-06-17 MED ORDER — HYDRALAZINE HCL 20 MG/ML IJ SOLN
5.0000 mg | INTRAMUSCULAR | Status: AC | PRN
  Administered 2016-06-17: 13:00:00 5 mg via INTRAVENOUS
  Filled 2016-06-17: qty 1

## 2016-06-17 MED ORDER — IOPAMIDOL (ISOVUE-370) INJECTION 76%
INTRAVENOUS | Status: DC | PRN
  Administered 2016-06-17: 155 mL via INTRA_ARTERIAL

## 2016-06-17 MED ORDER — ANGIOPLASTY BOOK
Freq: Once | Status: AC
  Administered 2016-06-17: 21:00:00
  Filled 2016-06-17: qty 1

## 2016-06-17 MED ORDER — METOPROLOL SUCCINATE ER 25 MG PO TB24
25.0000 mg | ORAL_TABLET | Freq: Every day | ORAL | Status: DC
  Administered 2016-06-18: 09:00:00 25 mg via ORAL
  Filled 2016-06-17: qty 1

## 2016-06-17 MED ORDER — FAMOTIDINE IN NACL 20-0.9 MG/50ML-% IV SOLN
INTRAVENOUS | Status: DC | PRN
  Administered 2016-06-17: 20 mg via INTRAVENOUS

## 2016-06-17 MED ORDER — L-ARGININE 1000 MG PO TABS: 1000.0000 mg | ORAL_TABLET | Freq: Two times a day (BID) | ORAL | Status: DC

## 2016-06-17 MED ORDER — ASPIRIN 81 MG PO CHEW
CHEWABLE_TABLET | ORAL | Status: AC
  Administered 2016-06-17: 81 mg via ORAL
  Filled 2016-06-17: qty 1

## 2016-06-17 MED ORDER — IOPAMIDOL (ISOVUE-370) INJECTION 76%
INTRAVENOUS | Status: AC
  Filled 2016-06-17: qty 125

## 2016-06-17 MED ORDER — SODIUM CHLORIDE 0.9 % IV SOLN
INTRAVENOUS | Status: DC
  Administered 2016-06-17: 07:00:00 via INTRAVENOUS

## 2016-06-17 MED ORDER — TIZANIDINE HCL 2 MG PO TABS
2.0000 mg | ORAL_TABLET | Freq: Two times a day (BID) | ORAL | Status: DC
  Administered 2016-06-17 – 2016-06-18 (×2): 2 mg via ORAL
  Filled 2016-06-17 (×3): qty 1

## 2016-06-17 MED ORDER — CLOPIDOGREL BISULFATE 75 MG PO TABS
75.0000 mg | ORAL_TABLET | Freq: Every day | ORAL | Status: DC
  Administered 2016-06-18: 09:00:00 75 mg via ORAL
  Filled 2016-06-17: qty 1

## 2016-06-17 MED ORDER — VERAPAMIL HCL 2.5 MG/ML IV SOLN
INTRAVENOUS | Status: DC | PRN
  Administered 2016-06-17: 10 mL via INTRA_ARTERIAL

## 2016-06-17 MED ORDER — SODIUM CHLORIDE 0.9% FLUSH
3.0000 mL | Freq: Two times a day (BID) | INTRAVENOUS | Status: DC
  Administered 2016-06-17: 3 mL via INTRAVENOUS

## 2016-06-17 MED ORDER — LIDOCAINE HCL (PF) 1 % IJ SOLN
INTRAMUSCULAR | Status: DC | PRN
  Administered 2016-06-17: 2 mL

## 2016-06-17 MED ORDER — HEPARIN (PORCINE) IN NACL 2-0.9 UNIT/ML-% IJ SOLN
INTRAMUSCULAR | Status: DC | PRN
  Administered 2016-06-17: 1000 mL

## 2016-06-17 SURGICAL SUPPLY — 21 items
BALLN EUPHORA RX 2.5X12 (BALLOONS) ×2
BALLN ~~LOC~~ MOZEC 2.5X8 (BALLOONS) ×2
BALLN ~~LOC~~ MOZEC 4.0X8 (BALLOONS) ×2
BALLOON EUPHORA RX 2.5X12 (BALLOONS) IMPLANT
BALLOON ~~LOC~~ MOZEC 2.5X8 (BALLOONS) ×1 IMPLANT
BALLOON ~~LOC~~ MOZEC 4.0X8 (BALLOONS) IMPLANT
CATH GUIDEZILLA II 6F (CATHETERS) IMPLANT
CATH INFINITI 5FR MULTPACK ANG (CATHETERS) ×2 IMPLANT
CATH LAUNCHER 6FR JR4 (CATHETERS) ×1 IMPLANT
CATHETER GUIDEZILLA II 6F (CATHETERS) ×2
GLIDESHEATH SLEND SS 6F .021 (SHEATH) ×1 IMPLANT
GUIDEWIRE INQWIRE 1.5J.035X260 (WIRE) IMPLANT
INQWIRE 1.5J .035X260CM (WIRE) ×2
KIT ENCORE 26 ADVANTAGE (KITS) ×2 IMPLANT
KIT HEART LEFT (KITS) ×2 IMPLANT
PACK CARDIAC CATHETERIZATION (CUSTOM PROCEDURE TRAY) ×2 IMPLANT
STENT SYNERGY DES 4X12 (Permanent Stent) ×2 IMPLANT
SYR MEDRAD MARK V 150ML (SYRINGE) ×2 IMPLANT
TRANSDUCER W/STOPCOCK (MISCELLANEOUS) ×2 IMPLANT
TUBING CIL FLEX 10 FLL-RA (TUBING) ×2 IMPLANT
WIRE COUGAR XT STRL 190CM (WIRE) ×1 IMPLANT

## 2016-06-17 NOTE — Care Management Note (Signed)
Case Management Note  Patient Details  Name: ESCHER HARR MRN: [id number] Date of Birth: 13-Jul-1945  Subjective/Objective:     s/p stent intervention, will be on plavix.               Action/Plan: NCM will follow for dc needs.  Expected Discharge Date:                  Expected Discharge Plan:  Home/Self Care  In-House Referral:     Discharge planning Services  CM Consult  Post Acute Care Choice:    Choice offered to:     DME Arranged:    DME Agency:     HH Arranged:    HH Agency:     Status of Service:  In process, will continue to follow  If discussed at Long Length of Stay Meetings, dates discussed:    Additional Comments:  Zenon Mayo, RN 06/17/2016, 12:48 PM

## 2016-06-17 NOTE — Interval H&P Note (Signed)
History and Physical Interval Note:  06/17/2016 8:21 AM  Joseph Soto  has presented today for cardiac cath with the diagnosis of CAD with unstable angina, abnormal stress test  The various methods of treatment have been discussed with the patient and family. After consideration of risks, benefits and other options for treatment, the patient has consented to  Procedure(s): Left Heart Cath and Cors/Grafts Angiography (N/A) as a surgical intervention .  The patient's history has been reviewed, patient examined, no change in status, stable for surgery.  I have reviewed the patient's chart and labs.  Questions were answered to the patient's satisfaction.    Cath Lab Visit (complete for each Cath Lab visit)  Clinical Evaluation Leading to the Procedure:   ACS: No.  Non-ACS:    Anginal Classification: CCS III  Anti-ischemic medical therapy: Minimal Therapy (1 class of medications)  Non-Invasive Test Results: Intermediate-risk stress test findings: cardiac mortality 1-3%/year  Prior CABG: Previous CABG         Lauree Chandler

## 2016-06-17 NOTE — Progress Notes (Signed)
1312 patients blood pressure elevated  191/84 at 1245 175/77 at 1250 167/89. Given 5 mg IV hydralazine and blood pressure decreased after 1345 Blood pressure 130's /50-70.

## 2016-06-17 NOTE — Progress Notes (Signed)
PHARMACIST - PHYSICIAN ORDER COMMUNICATION  CONCERNING: P&T Medication Policy on Herbal Medications  DESCRIPTION:  This patient's order for:  CoEnzyme Q10 and L-arginine  has been noted.  This product(s) is classified as an "herbal" or natural product. Due to a lack of definitive safety studies or FDA approval, nonstandard manufacturing practices, plus the potential risk of unknown drug-drug interactions while on inpatient medications, the Pharmacy and Therapeutics Committee does not permit the use of "herbal" or natural products of this type within [facility].   ACTION TAKEN: The pharmacy department is unable to verify this order at this time and your patient has been informed of this safety policy. Please reevaluate patient's clinical condition at discharge and address if the herbal or natural product(s) should be resumed at that time.   Lewie Chamber., PharmD Clinical Pharmacist [facility]

## 2016-06-17 NOTE — H&P (View-Only) (Signed)
CARDIOLOGY OFFICE NOTE  Date:  06/09/2016    Joseph Soto Date of Birth: 03-Feb-1946 Medical Record #[id number]  PCP:  Donnajean Lopes, MD  Cardiologist:  Aldona Bar    Chief Complaint  Patient presents with  . Follow-up    Work in visit to discuss test results - seen for Dr. Angelena Form    History of Present Illness: Joseph Soto is a 71 y.o. male who presents today for a follow up visit to discuss his test results.  Seen for Dr. Angelena Form.   He has a history of CAD s/p CABG 2007, Non-hodgkins lymphoma, & HLD.  He has been followed in the past by Dr. Doreatha Lew. He had a 4V CABG in 2007. Last cath February 2010 with occluded SVG to Diagonal and occluded SVG to PDA/PLAwithpatent LIMA graft to the LAD. There was diffuse disease in distal vessels felt best to be managed medically. Last echo 10/31/13 with normal LV function. 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   Seen here back in February by Dr. Angelena Form and was felt to be doing ok.   Then called about a month later with shortness of breath. I ended up seeing him. He was on a multitude of sedating drugs. Echo updated - some fall in the EF - Myoview then arranged - now read as intermediate. BNP normal on his labs. To refer for cardiac cath.    Comes in today. Here with his wife. He is weaning some of his sedatives that he is on for chronic pain. No appetite - weight is down. Still short of breath. While he says this is some better - still "not right" and he feels "like something is wrong". No actual chest pain. Anxious to proceed on with further evaluation.   Past Medical History:  Diagnosis Date  . Buttock pain   . CAD (coronary artery disease)    Last cath 2010 per Dr. Doreatha Lew with diffuse multivessel disease, small caliber vessels not felt to be suitable for PCI  . Dizziness    1 episode   . Fibromyalgia   . Head pain   . HLD (hyperlipidemia)   . Hypertension   .  Myocardial infarction ([place])   . Non Hodgkin's lymphoma [facility])     Past Surgical History:  Procedure Laterality Date  . Baker cyst removal    . CHOLECYSTECTOMY    . CORONARY ARTERY BYPASS GRAFT  5/07   x5. LV dysfunction.   Marland Kitchen MASS EXCISION N/A 11/11/2015   Procedure: EXCISION OF MID CHEST  MASS;  Surgeon: Judeth Horn, MD;  Location: [facility];  Service: General;  Laterality: N/A;  . MASS EXCISION N/A 12/23/2015   Procedure: REEXCISION OF CHEST WALL TUMOR SITE;  Surgeon: Judeth Horn, MD;  Location: [place];  Service: General;  Laterality: N/A;  . TONSILLECTOMY       Medications: Current Outpatient Prescriptions  Medication Sig Dispense Refill  . aspirin EC 81 MG tablet Take 81 mg by mouth daily.    . cholecalciferol (VITAMIN D) 400 UNITS TABS Take 400 Units by mouth daily.    Marland Kitchen co-enzyme Q-10 30 MG capsule Take 100 mg by mouth daily.     . DULoxetine (CYMBALTA) 60 MG capsule Take 1 capsule (60 mg total) by mouth daily. (Patient taking differently: Take 60 mg by mouth daily. Weaning off down to 5 pill weekly) 90 capsule 3  . fish oil-omega-3 fatty  acids 1000 MG capsule Take 2 g by mouth daily.      Marland Kitchen gabapentin (NEURONTIN) 300 MG capsule Take 300 mg by mouth 3 (three) times daily.   12  . metoprolol succinate (TOPROL XL) 25 MG 24 hr tablet Take 1 tablet (25 mg total) by mouth daily. 90 tablet 3  . Multiple Vitamin (MULTIVITAMIN) capsule Take 1 capsule by mouth daily.      . nitroGLYCERIN (NITROSTAT) 0.4 MG SL tablet Place 1 tablet (0.4 mg total) under the tongue every 5 (five) minutes as needed for chest pain. 25 tablet 6  . ondansetron (ZOFRAN ODT) 4 MG disintegrating tablet 4mg  ODT q4 hours prn nausea/vomit 4 tablet 0  . rosuvastatin (CRESTOR) 40 MG tablet TAKE 1 TABLET BY MOUTH EVERY DAY 90 tablet 2  . tiZANidine (ZANAFLEX) 2 MG tablet Take 1 tablet (2 mg total) by mouth 3 (three) times daily. 270 tablet 1  . traMADol (ULTRAM) 50 MG tablet Take 50 mg  by mouth 2 (two) times daily.     Marland Kitchen triamcinolone (NASACORT) 55 MCG/ACT AERO nasal inhaler Place 1 spray into the nose daily. 1 Inhaler 12   No current facility-administered medications for this visit.     Allergies: Allergies  Allergen Reactions  . Niacin And Related Nausea And Vomiting  . Niaspan [Niacin Er]     Stomach intolerance    Social History: The patient  reports that he has quit smoking. He has never used smokeless tobacco. He reports that he does not drink alcohol or use drugs.   Family History: The patient's family history includes Dementia in his mother; Heart attack in his father; Heart disease in his father.   Review of Systems: Please see the history of present illness.   Otherwise, the review of systems is positive for none.   All other systems are reviewed and negative.   Physical Exam: VS:  BP 122/80 (BP Location: Left Arm, Patient Position: Sitting, Cuff Size: Normal)   Pulse 87   Ht 5' 11.5" (1.816 m)   Wt 189 lb (85.7 kg)   SpO2 97% Comment: at rest  BMI 25.99 kg/m  .  BMI Body mass index is 25.99 kg/m.  Wt Readings from Last 3 Encounters:  06/09/16 189 lb (85.7 kg)  04/29/16 195 lb 6.4 oz (88.6 kg)  04/03/16 196 lb 6.4 oz (89.1 kg)    General: Pleasant. Well developed, well nourished and in no acute distress. He has lost weight.   HEENT: Normal.  Neck: Supple, no JVD, carotid bruits, or masses noted.  Cardiac: Regular rate and rhythm. No murmurs, rubs, or gallops. No edema.  Respiratory:  Lungs are clear to auscultation bilaterally with normal work of breathing.  GI: Soft and nontender.  MS: No deformity or atrophy. Gait and ROM intact.  Skin: Warm and dry. Color is normal.  Neuro:  Strength and sensation are intact and no gross focal deficits noted.  Psych: Alert, appropriate and with normal affect.   LABORATORY DATA:  EKG:  EKG is not ordered today.  Lab Results  Component Value Date   WBC 11.6 (H) 04/29/2016   HGB 13.6 12/23/2015    HCT 39.4 04/29/2016   PLT 272 04/29/2016   GLUCOSE 118 (H) 04/29/2016   CHOL 181 05/02/2012   TRIG 59.0 05/02/2012   HDL 45.70 05/02/2012   LDLDIRECT 198.4 08/21/2010   LDLCALC 124 (H) 05/02/2012   ALT 19 01/24/2013   AST 23 01/24/2013   NA 139 04/29/2016  K 4.8 04/29/2016   CL 101 04/29/2016   CREATININE 1.17 04/29/2016   BUN 28 (H) 04/29/2016   CO2 22 04/29/2016   TSH 3.150 12/03/2015   INR 1.09 01/24/2013    BNP (last 3 results) No results for input(s): BNP in the last 8760 hours.  ProBNP (last 3 results)  Recent Labs  04/29/16 1504  PROBNP 217     Other Studies Reviewed Today:  Myoview Study Highlights 05/2016   The left ventricular ejection fraction is mildly decreased (45-54%).  Nuclear stress EF: 47%.  Blood pressure demonstrated a hypertensive response to exercise.  Wandering baseline artifact made EKG interpretation difficult but overall there was 47mm of J point depression with upsloping ST segment depression at peak exercise and immediate recovery,  There were frequent PACs and PVCs as well as PACs with aberration.  There is a small defect of moderate severity present in the basal inferoseptal and mid inferoseptal location. The defect is non-reversible and worse on resting images and consistent with infarct vs. diaphragmatic attenuation artifact.  There is a medium defect of severe severity present in the basal inferior, mid inferior and apical inferior location. The defect is partially reversible and represents variations in diaphragmatic attenuation artifact vs. Infarct with peri infarct ischemia. .  This is an intermediate risk study.      Echo Study Conclusions 05/2017  - Left ventricle: The cavity size was normal. There was mild focal   basal hypertrophy of the septum. Systolic function was mildly to   moderately reduced. The estimated ejection fraction was in the   range of 40% to 45%. Hypokinesis of the basal-mid anteroseptal   and  anterior myocardium; consistent with ischemia in the   distribution of the left anterior descending coronary artery,   with a patent distal bypass. Doppler parameters are consistent   with abnormal left ventricular relaxation (grade 1 diastolic   dysfunction). - Ventricular septum: Septal motion showed paradox. - Right ventricle: Systolic function was mildly reduced.   Cardiac cath February 2010:  CORONARY ARTERIES: 1. Left main coronary artery has mild irregularities, but no significant focal stenosis.  The left anterior descending has a 70% stenosis proximally. There arises a moderate diagonal vessel that has a 90% stenosis. Further down the left anterior descending, there is a 95% focal stenosis. There is a diagonal vessel that fills by bidirectional flow as well as bidirectional flow into the distal left anterior descending with internal mammary graft. Left circumflex. Left circumflex bifurcates almost at its ostium. The obtuse marginal was a previously bypass graft and it has 30-40% narrowing. It has good distal runoff. The circumflex branch proceeds in the AV groove has an ostial 70% stenosis. The right coronary artery is a large dominant vessel. There is a 30-40% scattered narrowing before the crux and 50% narrowing just before the crux. There is a 70% ostial narrowing in the second posterior descending and a 95% stenosis in the midportion of the small vessels. There is 30-40% stenosis in the first posterior descending. There is 80-90% stenosis in ostial portion of the small RV branch.  Saphenous vein graft to posterior descending and posterolateral branch is occluded proximally.  Saphenous vein graft to the intermediate diagonal vessel was occluded proximally.  The left internal mammary artery to the LAD is patent with excellent flow.  Echo 10/31/13: Left ventricle:  The cavity size was normal. Wall thickness was normal. LV mid ventricular false tendon. Systolic function was normal. The estimated ejection fraction was in the  range of 55% to 60%. Mild inferior hypokinesis. Doppler parameters are consistent with abnormal left ventricular relaxation (grade 1 diastolic dysfunction). The E/e&' ratio is <8, suggesting normal LV filling pressure. - Mitral valve: Mildly thickened leaflets . There was trivial regurgitation. - Left atrium: The atrium was at the upper limits of normal in size. - Right atrium: The atrium was at the upper limits of normal in size.  Impressions:  - Compared to the prior echo in 2012, the EF is slightly higher at 55-60%, there is mild inferior hypokinesis. Diastolic dysfunction is present with normal LV filling pressure.   Assessment/Plan:  1. Shortness of breath - with exertion - and fatigue - most likely multifactorial. But echo has shown worsening of his EF. Myoview now read as intermediate risk. Discussed with Dr. Angelena Form previously - will refer on for repeat cardiac catheterization - discussed at length. The patient understands that risks include but are not limited to stroke (1 in 1000), death (1 in 52), kidney failure [usually temporary] (1 in 500), bleeding (1 in 200), allergic reaction [possibly serious] (1 in 200), and agrees to proceed. Arranged for next Wednesday, May 9th at 8:30AM.   2. CAD without angina: No chest pain suggestive of angina.  He is known to have diffuse CAD with one patent bypass graft by cardiac cath in 2010. Now with intermediate Myoview and continued symptoms of shortness of breath - proceeding with cardiac cath  3. Hyperlipidemia: Continue statin. Lipids followed in primary care.   4. Chronic pain syndrome - followed in pain management.    Current medicines are reviewed with the patient today.  The patient does not have concerns regarding medicines other than what  has been noted above.  The following changes have been made:  See above.  Labs/ tests ordered today include:    Orders Placed This Encounter  Procedures  . Basic metabolic panel  . CBC  . APTT  . Protime-INR     Disposition:   Further disposition to follow. He is to keep his follow up with Dr. Angelena Form. If his cardiac cath is stable, would consider referral to pulmonary.    Patient is agreeable to this plan and will call if any problems develop in the interim.   SignedTruitt Merle, NP  06/09/2016 10:35 AM  [facility] [address] [place], [place]  [address] Phone: [phone] Fax: [phone]

## 2016-06-17 NOTE — Telephone Encounter (Signed)
Wife  [name] called to inform Dr. Benay Spice that pt is currently at [facility] - room  [phone] .  Pt has cardiac cath and will be in the hospital overnight.  Pt will not make office visit with Dr. Benay Spice on Thursday  06/18/16.

## 2016-06-18 ENCOUNTER — Other Ambulatory Visit: Payer: Self-pay | Admitting: Cardiology

## 2016-06-18 ENCOUNTER — Ambulatory Visit: Payer: Medicare HMO | Admitting: Oncology

## 2016-06-18 DIAGNOSIS — I200 Unstable angina: Secondary | ICD-10-CM

## 2016-06-18 DIAGNOSIS — E785 Hyperlipidemia, unspecified: Secondary | ICD-10-CM | POA: Diagnosis not present

## 2016-06-18 DIAGNOSIS — M797 Fibromyalgia: Secondary | ICD-10-CM | POA: Diagnosis not present

## 2016-06-18 DIAGNOSIS — I252 Old myocardial infarction: Secondary | ICD-10-CM | POA: Diagnosis not present

## 2016-06-18 DIAGNOSIS — I2584 Coronary atherosclerosis due to calcified coronary lesion: Secondary | ICD-10-CM | POA: Diagnosis not present

## 2016-06-18 DIAGNOSIS — I25708 Atherosclerosis of coronary artery bypass graft(s), unspecified, with other forms of angina pectoris: Secondary | ICD-10-CM

## 2016-06-18 DIAGNOSIS — C8590 Non-Hodgkin lymphoma, unspecified, unspecified site: Secondary | ICD-10-CM | POA: Diagnosis not present

## 2016-06-18 DIAGNOSIS — I2582 Chronic total occlusion of coronary artery: Secondary | ICD-10-CM | POA: Diagnosis not present

## 2016-06-18 DIAGNOSIS — G894 Chronic pain syndrome: Secondary | ICD-10-CM | POA: Diagnosis not present

## 2016-06-18 DIAGNOSIS — I255 Ischemic cardiomyopathy: Secondary | ICD-10-CM | POA: Diagnosis not present

## 2016-06-18 DIAGNOSIS — I25710 Atherosclerosis of autologous vein coronary artery bypass graft(s) with unstable angina pectoris: Secondary | ICD-10-CM | POA: Diagnosis not present

## 2016-06-18 DIAGNOSIS — I10 Essential (primary) hypertension: Secondary | ICD-10-CM | POA: Diagnosis not present

## 2016-06-18 LAB — CBC
HCT: 37.9 % — ABNORMAL LOW (ref 39.0–52.0)
Hemoglobin: 12.6 g/dL — ABNORMAL LOW (ref 13.0–17.0)
MCH: 29.3 pg (ref 26.0–34.0)
MCHC: 33.2 g/dL (ref 30.0–36.0)
MCV: 88.1 fL (ref 78.0–100.0)
Platelets: 191 10*3/uL (ref 150–400)
RBC: 4.3 MIL/uL (ref 4.22–5.81)
RDW: 14.6 % (ref 11.5–15.5)
WBC: 10.9 10*3/uL — ABNORMAL HIGH (ref 4.0–10.5)

## 2016-06-18 LAB — BASIC METABOLIC PANEL
Anion gap: 7 (ref 5–15)
BUN: 17 mg/dL (ref 6–20)
CO2: 23 mmol/L (ref 22–32)
Calcium: 8.7 mg/dL — ABNORMAL LOW (ref 8.9–10.3)
Chloride: 107 mmol/L (ref 101–111)
Creatinine, Ser: 0.95 mg/dL (ref 0.61–1.24)
GFR calc Af Amer: >60 mL/min (ref >60)
GFR calc non Af Amer: >60 mL/min (ref >60)
Glucose, Bld: 107 mg/dL — ABNORMAL HIGH (ref 65–99)
Potassium: 4 mmol/L (ref 3.5–5.1)
Sodium: 137 mmol/L (ref 135–145)

## 2016-06-18 MED ORDER — CLOPIDOGREL BISULFATE 75 MG PO TABS: 75.0000 mg | ORAL_TABLET | Freq: Every day | ORAL | 11 refills | Status: DC

## 2016-06-18 MED ORDER — NITROGLYCERIN 0.4 MG SL SUBL: 0.4000 mg | SUBLINGUAL_TABLET | SUBLINGUAL | 3 refills | Status: DC | PRN

## 2016-06-18 NOTE — Discharge Summary (Signed)
Discharge Summary    Patient ID: DONOVAN PERSLEY,  MRN: [id number], DOB/AGE: [id number] 72 y.o.  Admit date: 06/17/2016 Discharge date: 06/18/2016  Primary Care Provider: [name] Primary Cardiologist: McAlhany/Lori Gerhardt  Discharge Diagnoses    Active Problems:   Unstable angina [facility])   CAD (coronary artery disease)   Ischemic cardiomyopathy   Hyperlipidemia  Allergies Allergies  Allergen Reactions  . Niacin And Related Nausea And Vomiting  . Niaspan [Niacin Er]     Stomach intolerance    Diagnostic Studies/Procedures    LHC: 06/17/16  Coronary Diagrams   Diagnostic Diagram       Post-Intervention Diagram         Conclusion     Prox RCA to Mid RCA lesion, 40 %stenosed.  Ost RPDA to RPDA lesion, 70 %stenosed.  Dist RCA-1 lesion, 50 %stenosed.  Dist RCA-2 lesion, 50 %stenosed.  Ost LM to LM lesion, 40 %stenosed.  Ost Cx to Prox Cx lesion, 80 %stenosed.  LM lesion, 40 %stenosed.  Ost LAD to Prox LAD lesion, 90 %stenosed.  Mid LAD lesion, 100 %stenosed.  LIMA graft was visualized by angiography and is normal in caliber.  SVG graft was not injected.  Origin lesion, 100 %stenosed.  SVG graft was not injected.  Origin to Prox Graft lesion, 100 %stenosed.  A STENT SYNERGY DES 4X12 drug eluting stent was successfully placed.  Prox RCA lesion, 99 %stenosed.  Post intervention, there is a 0% residual stenosis.  There is mild left ventricular systolic dysfunction.  LV end diastolic pressure is normal.  The left ventricular ejection fraction is 45-50% by visual estimate.  There is no mitral valve regurgitation.   1. Triple vessel CAD s/p 4V CABG with 1/4 patent bypass grafts 2. Chronic mid LAD occlusion with patent LIMA graft to the LAD 3. Moderately severe proximal Circumflex stenosis in a small to moderate caliber vessel. Overall unchanged from last cath in 2010. The vein graft to the intermediate branch is known to be  occluded by the prior cath.  4. Severe stenosis in the moderately calcified proximal RCA. Moderate stenosis in the PDA/PLA branches, overall unchanged from last cath in 2010. The sequential vein graft to the PDA and PLA is known to be occluded by prior cath.  5. Moderate left main stenosis 6. Successful PTCA/DES x 1 proximal RCA  7. Mild LV systolic dysfunction  Recommendations: Continue DAPT with ASA and Plavix for at least one year. Continue statin and beta blocker. Medical management of disease in Circumflex and distal RCA branches.     _____________   History of Present Illness     ARTHA CHIASSON is a 71 y.o. male with ahistory of CAD s/p CABG 2007, Non-hodgkins lymphoma, &HLD. He has been followed in the past by Dr. Doreatha Lew. He had a 4V CABG in 2007. Last cath February 2010 with occluded SVG to Diagonal and occluded SVG to PDA/PLAwithpatent LIMA graft to the LAD. There was diffuse disease in distal vessels felt best to be managed medically. Last echo 10/31/13 with normal LV function. 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 Seen in the office back February by Dr. Angelena Form and was felt to be doing ok.   Then called about a month later with shortness of breath. Was seen by Cecille Rubin with theses symptoms. He was on a multitude of sedating drugs. Echo updated - some fall in the EF - Myoview then arranged - was read as intermediate.  BNP normal on his labs.Planned to refer for cardiac cath.    Last seen in the office on 06/09/16 by Cecille Rubin. Reported to be weaning some of his sedatives that he is on for chronic pain. No appetite - weight was down. Still short of breath. While he said this is some better - still "not right" and he feels "like something is wrong". No actual chest pain. Anxious to proceed on with further evaluation.    Hospital Course     He presented for outpatient cath with Dr. Angelena Form on 06/17/16 noted above with DES x1 placed to native pRCA, 1/4  patent grafts noted. Plan for DAPT with ASA/plavix for at least one year. Developed a left radial hematoma post cath. Improved, bruising noted. Morning labs were stable.   He worked with cardiac rehab, no chest pain still noted some dyspnea but improved. He was seen by Dr. Burt Knack and determined stable for discharge home. Instructions given regarding post cath radial care. Follow up in the office has been arranged. Medications are noted below.   General: Well developed, well nourished, male appearing in no acute distress. Head: Normocephalic, atraumatic.  Neck: Supple without bruits, JVD. Lungs:  Resp regular and unlabored, CTA. Heart: RRR, S1, S2, no S3, S4, or murmur; no rub. Abdomen: Soft, non-tender, non-distended with normoactive bowel sounds. No hepatomegaly. No rebound/guarding. No obvious abdominal masses. Extremities: No clubbing, cyanosis, edema. Distal pedal pulses are 2+ bilaterally. L radial cath site stable, bruising noted, but soft no hematoma.  Neuro: Alert and oriented X 3. Moves all extremities spontaneously. Psych: Normal affect.  _____________  Discharge Vitals Blood pressure 130/71, pulse 74, temperature 97.6 F (36.4 C), temperature source Oral, resp. rate 14, weight 183 lb 6.8 oz (83.2 kg), SpO2 96 %.  Filed Weights   06/18/16 0700  Weight: 183 lb 6.8 oz (83.2 kg)    Labs & Radiologic Studies    CBC  Recent Labs  06/18/16 0259  WBC 10.9*  HGB 12.6*  HCT 37.9*  MCV 88.1  PLT 841   Basic Metabolic Panel  Recent Labs  06/18/16 0259  NA 137  K 4.0  CL 107  CO2 23  GLUCOSE 107*  BUN 17  CREATININE 0.95  CALCIUM 8.7*   Liver Function Tests No results for input(s): AST, ALT, ALKPHOS, BILITOT, PROT, ALBUMIN in the last 72 hours. No results for input(s): LIPASE, AMYLASE in the last 72 hours. Cardiac Enzymes No results for input(s): CKTOTAL, CKMB, CKMBINDEX, TROPONINI in the last 72 hours. BNP Invalid input(s): POCBNP D-Dimer No results for  input(s): DDIMER in the last 72 hours. Hemoglobin A1C No results for input(s): HGBA1C in the last 72 hours. Fasting Lipid Panel No results for input(s): CHOL, HDL, LDLCALC, TRIG, CHOLHDL, LDLDIRECT in the last 72 hours. Thyroid Function Tests No results for input(s): TSH, T4TOTAL, T3FREE, THYROIDAB in the last 72 hours.  Invalid input(s): FREET3 _____________  No results found. Disposition   Pt is being discharged home today in good condition.  Follow-up Plans & Appointments    Follow-up Information    Burnell Blanks, MD Follow up on 07/01/2016.   Specialty:  Cardiology Why:  at 9:20am for your follow up appt.  Contact information: [address] [address] [phone]          Discharge Instructions    Amb Referral to Cardiac Rehabilitation    Complete by:  As directed    Diagnosis:  Coronary Stents   Call MD  for:  redness, tenderness, or signs of infection (pain, swelling, redness, odor or green/yellow discharge around incision site)    Complete by:  As directed    Diet - low sodium heart healthy    Complete by:  As directed    Discharge instructions    Complete by:  As directed    Radial Site Care Refer to this sheet in the next few weeks. These instructions provide you with information on caring for yourself after your procedure. Your caregiver may also give you more specific instructions. Your treatment has been planned according to current medical practices, but problems sometimes occur. Call your caregiver if you have any problems or questions after your procedure. HOME CARE INSTRUCTIONS You may shower the day after the procedure.Remove the bandage (dressing) and gently wash the site with plain soap and water.Gently pat the site dry.  Do not apply powder or lotion to the site.  Do not submerge the affected site in water for 3 to 5 days.  Inspect the site at least twice daily.  Do not flex or bend the affected arm for 24 hours.    No lifting over 5 pounds (2.3 kg) for 5 days after your procedure.  Do not drive home if you are discharged the same day of the procedure. Have someone else drive you.  You may drive 24 hours after the procedure unless otherwise instructed by your caregiver.  What to expect: Any bruising will usually fade within 1 to 2 weeks.  Blood that collects in the tissue (hematoma) may be painful to the touch. It should usually decrease in size and tenderness within 1 to 2 weeks.  SEEK IMMEDIATE MEDICAL CARE IF: You have unusual pain at the radial site.  You have redness, warmth, swelling, or pain at the radial site.  You have drainage (other than a small amount of blood on the dressing).  You have chills.  You have a fever or persistent symptoms for more than 72 hours.  You have a fever and your symptoms suddenly get worse.  Your arm becomes pale, cool, tingly, or numb.  You have heavy bleeding from the site. Hold pressure on the site.   Increase activity slowly    Complete by:  As directed       Discharge Medications   Current Discharge Medication List    START taking these medications   Details  clopidogrel (PLAVIX) 75 MG tablet Take 1 tablet (75 mg total) by mouth daily with breakfast. Qty: 30 tablet, Refills: 11      CONTINUE these medications which have NOT CHANGED   Details  aspirin EC 81 MG tablet Take 81 mg by mouth every evening.     cholecalciferol (VITAMIN D) 1000 units tablet Take 1,000 Units by mouth daily.    Coenzyme Q10 300 MG CAPS Take 300 mg by mouth daily.    DULoxetine (CYMBALTA) 60 MG capsule Take 1 capsule (60 mg total) by mouth daily. Qty: 90 capsule, Refills: 3    gabapentin (NEURONTIN) 300 MG capsule Take 300 mg by mouth 2 (two) times daily.  Refills: 12    L-Arginine 1000 MG TABS Take 1,000 mg by mouth 2 (two) times daily.    Magnesium Gluconate 550 MG TABS Take 550 mg by mouth every Monday, Wednesday, and Friday.    metoprolol succinate (TOPROL XL)  25 MG 24 hr tablet Take 1 tablet (25 mg total) by mouth daily. Qty: 90 tablet, Refills: 3    Multiple Vitamin (MULTIVITAMIN)  capsule Take 1 capsule by mouth daily.      nitroGLYCERIN (NITROSTAT) 0.4 MG SL tablet Place 1 tablet (0.4 mg total) under the tongue every 5 (five) minutes as needed for chest pain. Qty: 25 tablet, Refills: 6   Associated Diagnoses: CAD (coronary artery disease) of artery bypass graft    Probiotic CAPS Take 1 capsule by mouth 2 (two) times daily.    rosuvastatin (CRESTOR) 40 MG tablet TAKE 1 TABLET BY MOUTH EVERY DAY Qty: 90 tablet, Refills: 2    tiZANidine (ZANAFLEX) 2 MG tablet Take 1 tablet (2 mg total) by mouth 3 (three) times daily. Qty: 270 tablet, Refills: 1    traMADol (ULTRAM) 50 MG tablet Take 50 mg by mouth 2 (two) times daily.     triamcinolone (NASACORT) 55 MCG/ACT AERO nasal inhaler Place 1 spray into the nose daily. Qty: 1 Inhaler, Refills: 12         Aspirin prescribed at discharge?  Yes High Intensity Statin Prescribed? (Lipitor 40-80mg  or Crestor 20-40mg ): Yes Beta Blocker Prescribed? Yes For EF <40%, was ACEI/ARB Prescribed? No: Not on prior to admission. Consider at outpatient follow up. EF 45-50% ADP Receptor Inhibitor Prescribed? (i.e. Plavix etc.-Includes Medically Managed Patients): Yes For EF <40%, Aldosterone Inhibitor Prescribed? No: EF 45-50% Was EF assessed during THIS hospitalization? Yes Was Cardiac Rehab II ordered? (Included Medically managed Patients): Yes   Outstanding Labs/Studies   N/A  Duration of Discharge Encounter   Greater than 30 minutes including physician time.   Signed, Reino Bellis NP-C 06/18/2016, 9:18 AM   Patient seen, examined. Available data reviewed. Agree with findings, assessment, and plan as outlined by Reino Bellis, NP-C.   My Exam: Vitals:   06/17/16 2045 06/18/16 0700  BP: (!) 141/75 130/71  Pulse:  74  Resp: 18 14  Temp:  97.6 F (36.4 C)   Pt is alert and oriented,  NAD HEENT: normal Neck: JVP - normal Lungs: CTA bilaterally CV: RRR without murmur or gallop Abd: soft, NT, Positive BS, no hepatomegaly Ext: no C/C/E, distal pulses intact and equal, left radial bruising without firm hematoma Skin: warm/dry no rash  Pt stable after elective PCI yesterday. Plans as outlined above. Will FU with Dr Angelena Form. Understands importance of DAPT with ASA and plavix.  Sherren Mocha, M.D. 06/18/2016 9:20 AM

## 2016-06-18 NOTE — Progress Notes (Signed)
CARDIAC REHAB PHASE I   PRE:  Rate/Rhythm: 86 SR  BP:  Sitting: 152/80    MODE:  Ambulation: 500 ft   POST:  Rate/Rhythm: 103 ST  BP:  Sitting: 157/103       Pt ambulated 500 ft on RA, independent, steady gait, tolerated well.  Pt c/o mild DOE, states he feels it is "worse than he expected," denies cp, dizziness, declined rest stop. BP elevated, RN aware. Completed PCI/stent education.  Reviewed risk factors, anti-platelet therapy, stent card, activity restrictions, ntg, exercise, heart healthy diet, and phase 2 cardiac rehab. Pt verbalized understanding, receptive, pt is very active, exercises regularly. Pt agrees to phase 2 cardiac rehab referral, will send to [facility] per pt request. Pt to recliner after walk, call bell within reach.    [place], RN, BSN 06/18/2016 8:40 AM

## 2016-06-19 ENCOUNTER — Telehealth (HOSPITAL_COMMUNITY): Payer: Self-pay

## 2016-06-19 NOTE — Telephone Encounter (Addendum)
*  Correction/Correct insurance information* Patient insurance is active and insurance benefits verified. Patient has [employer] - $45.00 co-pay, no deductible, out of pocket $5900/$992.24, 40% co-insurance, no pre-authorization and no limit on visits. Passport/reference [phone].

## 2016-06-22 ENCOUNTER — Telehealth (HOSPITAL_COMMUNITY): Payer: Self-pay

## 2016-06-22 DIAGNOSIS — G894 Chronic pain syndrome: Secondary | ICD-10-CM | POA: Diagnosis not present

## 2016-06-22 DIAGNOSIS — Z79899 Other long term (current) drug therapy: Secondary | ICD-10-CM | POA: Diagnosis not present

## 2016-06-22 DIAGNOSIS — Z79891 Long term (current) use of opiate analgesic: Secondary | ICD-10-CM | POA: Diagnosis not present

## 2016-06-22 DIAGNOSIS — M47816 Spondylosis without myelopathy or radiculopathy, lumbar region: Secondary | ICD-10-CM | POA: Diagnosis not present

## 2016-06-22 NOTE — Telephone Encounter (Signed)
I called and left message on patient voicemail to call office about scheduling for orientation and cardiac rehab program. I left my contact information on patient voicemail to return my call.

## 2016-06-23 ENCOUNTER — Encounter: Payer: Self-pay | Admitting: Oncology

## 2016-06-24 DIAGNOSIS — M7072 Other bursitis of hip, left hip: Secondary | ICD-10-CM | POA: Diagnosis not present

## 2016-06-24 DIAGNOSIS — M47817 Spondylosis without myelopathy or radiculopathy, lumbosacral region: Secondary | ICD-10-CM | POA: Diagnosis not present

## 2016-06-24 DIAGNOSIS — M25552 Pain in left hip: Secondary | ICD-10-CM | POA: Diagnosis not present

## 2016-06-27 ENCOUNTER — Telehealth: Payer: Self-pay

## 2016-06-27 NOTE — Telephone Encounter (Signed)
Spoke with patient per inbox and his appt has been r/s to 6/14

## 2016-06-30 ENCOUNTER — Telehealth (HOSPITAL_COMMUNITY): Payer: Self-pay

## 2016-06-30 ENCOUNTER — Encounter (HOSPITAL_COMMUNITY): Payer: Self-pay

## 2016-06-30 NOTE — Telephone Encounter (Signed)
I called and left message on patient voicemail to call office about scheduling for orientation and cardiac rehab program. I left my contact information on patient voicemail to return my call.

## 2016-06-30 NOTE — Progress Notes (Signed)
Patient has been called 2X. I have mailed patient a letter with information about the cardiac rehab program. This is the final notice. If the patient does not respond to letter, referral will be canceled.

## 2016-07-01 ENCOUNTER — Ambulatory Visit (INDEPENDENT_AMBULATORY_CARE_PROVIDER_SITE_OTHER): Payer: Medicare HMO | Admitting: Cardiovascular Disease

## 2016-07-01 ENCOUNTER — Encounter: Payer: Self-pay | Admitting: Cardiovascular Disease

## 2016-07-01 VITALS — BP 102/66 | HR 80 | Ht 71.5 in | Wt 189.4 lb

## 2016-07-01 DIAGNOSIS — I25810 Atherosclerosis of coronary artery bypass graft(s) without angina pectoris: Secondary | ICD-10-CM | POA: Diagnosis not present

## 2016-07-01 DIAGNOSIS — E7800 Pure hypercholesterolemia, unspecified: Secondary | ICD-10-CM

## 2016-07-01 NOTE — Patient Instructions (Signed)
Medication Instructions:  Your physician recommends that you continue on your current medications as directed. Please refer to the Current Medication list given to you today.   Labwork: none  Testing/Procedures: none  Follow-Up: Your physician recommends that you schedule a follow-up appointment in: 6 months. Please call our office in August to schedule this appointment.     Any Other Special Instructions Will Be Listed Below (If Applicable).     If you need a refill on your cardiac medications before your next appointment, please call your pharmacy.

## 2016-07-01 NOTE — Progress Notes (Signed)
Chief Complaint  Patient presents with  . Follow-up    CAD     History of Present Illness: 71 yo WM with history of CAD s/p CABG 2007, Non-hodgkins Lymphoma, HLD here today for cardiac follow up. He had a 4V CABG in 2007. Cath in 2010 showed occluded SVG to Diagonal and occluded SVG to PDA/PLA withpatent LIMA graft to the  LAD. There was diffuse disease in distal vessels felt best to be managed medically. 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 Echo April 2018 with LVEF=40-45% and hypokinesis of the anteroseptal wall and apex. Nuclear stress test April 2018 with possible ischemia. Cardiac cath 06/17/16 with patent LIMA to LAD, all other grafts known to be occluded. Severe stenosis in the proximal RCA treated with a drug eluting stent.   He is here today for follow up. The patient denies any chest pain, dyspnea, palpitations, lower extremity edema, orthopnea, PND, dizziness, near syncope or syncope. He has had significant improvement in his breathing since the PCI.   Primary Care Physician: [name], MD  Past Medical History:  Diagnosis Date  . Buttock pain   . CAD (coronary artery disease)    Last cath 2010 per Dr. Doreatha Lew with diffuse multivessel disease, small caliber vessels not felt to be suitable for PCI  . Chronic left hip pain   . Dizziness    1 episode   . Fibromyalgia   . GERD (gastroesophageal reflux disease)   . Head pain    "not chronic; I have myofasial tightening in my head that causes pain in my skull" (06/17/2016)  . HLD (hyperlipidemia)   . Hypertension   . Myeloma ([place])    "found it in my chest when they did the excision"  . Myocardial infarction ([place]) 06/2005  . Non Hodgkin's lymphoma ([place]) dx'd 06/2005    Past Surgical History:  Procedure Laterality Date  . CARDIAC CATHETERIZATION  ~ 2008   "Dr. Doreatha Lew"  . CATARACT EXTRACTION W/ INTRAOCULAR LENS IMPLANT Right   . CORONARY ANGIOPLASTY WITH STENT PLACEMENT  06/17/2016   . CORONARY ARTERY BYPASS GRAFT  5/07   x5. LV dysfunction.   . CORONARY STENT INTERVENTION N/A 06/17/2016   Procedure: Coronary Stent Intervention;  Surgeon: Burnell Blanks, MD;  Location: [place] [facility];  Service: Cardiovascular;  Laterality: N/A;  . LAPAROSCOPIC CHOLECYSTECTOMY    . LEFT HEART CATH AND CORS/GRAFTS ANGIOGRAPHY N/A 06/17/2016   Procedure: Left Heart Cath and Cors/Grafts Angiography;  Surgeon: Burnell Blanks, MD;  Location: [place] [facility];  Service: Cardiovascular;  Laterality: N/A;  . MASS EXCISION N/A 11/11/2015   Procedure: EXCISION OF MID CHEST  MASS;  Surgeon: Judeth Horn, MD;  Location: [place];  Service: General;  Laterality: N/A;  . MASS EXCISION N/A 12/23/2015   Procedure: REEXCISION OF CHEST WALL TUMOR SITE;  Surgeon: Judeth Horn, MD;  Location: [place];  Service: General;  Laterality: N/A;  . POPLITEAL SYNOVIAL CYST EXCISION Right   . TONSILLECTOMY      Current Outpatient Prescriptions  Medication Sig Dispense Refill  . aspirin EC 81 MG tablet Take 81 mg by mouth every evening.     . cholecalciferol (VITAMIN D) 1000 units tablet Take 1,000 Units by mouth daily.    . clopidogrel (PLAVIX) 75 MG tablet Take 1 tablet (75 mg total) by mouth daily with breakfast. 30 tablet 11  . Coenzyme Q10 300 MG CAPS Take 300  mg by mouth daily.    . DULoxetine (CYMBALTA) 60 MG capsule Take 60 mg by mouth daily. Pt states he has one more pill and will be done with medication    . gabapentin (NEURONTIN) 300 MG capsule Take 300 mg by mouth 2 (two) times daily.   12  . L-Arginine 1000 MG TABS Take 1,000 mg by mouth 2 (two) times daily.    . Magnesium Gluconate 550 MG TABS Take 550 mg by mouth every Monday, Wednesday, and Friday.    . metoprolol succinate (TOPROL XL) 25 MG 24 hr tablet Take 1 tablet (25 mg total) by mouth daily. 90 tablet 3  . Multiple Vitamin (MULTIVITAMIN) capsule Take 1 capsule by mouth daily.      .  nitroGLYCERIN (NITROSTAT) 0.4 MG SL tablet Place 1 tablet (0.4 mg total) under the tongue every 5 (five) minutes as needed for chest pain. 25 tablet 3  . Probiotic CAPS Take 1 capsule by mouth 2 (two) times daily.    . rosuvastatin (CRESTOR) 40 MG tablet Take 40 mg by mouth daily.    Marland Kitchen tiZANidine (ZANAFLEX) 2 MG tablet Take by mouth 3 (three) times daily.    . traMADol (ULTRAM) 50 MG tablet Take 50 mg by mouth 2 (two) times daily.     Marland Kitchen triamcinolone (NASACORT) 55 MCG/ACT AERO nasal inhaler Place 1 spray into the nose daily as needed (Allergies).     No current facility-administered medications for this visit.     Allergies  Allergen Reactions  . Niacin And Related Nausea And Vomiting  . Niaspan [Niacin Er]     Stomach intolerance    Social History   Social History  . Marital status: Married    Spouse name: [name]  . Number of children: 2  . Years of education: college   Occupational History  . Camera operator    Social History Main Topics  . Smoking status: Former Smoker    Packs/day: 1.50    Years: 20.00    Types: Cigarettes  . Smokeless tobacco: Never Used     Comment: "quit 06/2005"  . Alcohol use No  . Drug use: No  . Sexual activity: Not on file   Other Topics Concern  . Not on file   Social History Narrative   Married, 2 children.   Runs an executive recruiting company.    Left-handed.   Drinks a bottle of green tea.    Family History  Problem Relation Age of Onset  . Heart disease Father   . Heart attack Father   . Dementia Mother     Review of Systems:  As stated in the HPI and otherwise negative.   BP 102/66   Pulse 80   Ht 5' 11.5" (1.816 m)   Wt 189 lb 6.4 oz (85.9 kg)   SpO2 96%   BMI 26.05 kg/m   Physical Examination:. General: Well developed, well nourished, NAD  HEENT: OP clear, mucus membranes moist  SKIN: warm, dry. No rashes. Neuro: No focal deficits  Musculoskeletal: Muscle strength 5/5 all ext  Psychiatric: Mood and  affect normal  Neck: No JVD, no carotid bruits, no thyromegaly, no lymphadenopathy.  Lungs:Clear bilaterally, no wheezes, rhonci, crackles Cardiovascular: Regular rate and rhythm. No murmurs, gallops or rubs. Abdomen:Soft. Bowel sounds present. Non-tender.  Extremities: No lower extremity edema. Pulses are 2 + in the bilateral DP/PT.  Echo 05/18/16: Left ventricle: The cavity size was normal. There was mild focal   basal hypertrophy of  the septum. Systolic function was mildly to   moderately reduced. The estimated ejection fraction was in the   range of 40% to 45%. Hypokinesis of the basal-mid anteroseptal   and anterior myocardium; consistent with ischemia in the   distribution of the left anterior descending coronary artery,   with a patent distal bypass. Doppler parameters are consistent   with abnormal left ventricular relaxation (grade 1 diastolic   dysfunction). - Ventricular septum: Septal motion showed paradox. - Right ventricle: Systolic function was mildly reduced.  EKG:  EKG is ordered today. The ekg ordered today demonstrates    Recent Labs: 12/03/2015: TSH 3.150 04/29/2016: NT-Pro BNP 217 06/18/2016: BUN 17; Creatinine, Ser 0.95; Hemoglobin 12.6; Platelets 191; Potassium 4.0; Sodium 137   Lipid Panel Followed in primary care   Wt Readings from Last 3 Encounters:  07/01/16 189 lb 6.4 oz (85.9 kg)  06/18/16 183 lb 6.8 oz (83.2 kg)  06/09/16 189 lb (85.7 kg)     Other studies Reviewed: Additional studies/ records that were reviewed today include: . Review of the above records demonstrates:    Assessment and Plan:   1. CAD without angina: He is doing well s/p recent PCI of the RCA. He has moderately severe disease in the proximal Circumflex which is unchanged from prior cath. Medical management of Circumflex stenosis. Will continue ASA, Plavix, statin, beta blocker.    2. Hyperlipidemia:Continue statin. Lipids followed in primary care.   Current medicines are  reviewed at length with the patient today.  The patient does not have concerns regarding medicines.  The following changes have been made:  no change  Labs/ tests ordered today include:   No orders of the defined types were placed in this encounter.   Disposition:   FU with me in 12  months  Signed, Lauree Chandler, MD 07/01/2016 10:22 AM    [facility] [address], [place], [place]  [address] Phone: [phone]; Fax: [phone]

## 2016-07-07 ENCOUNTER — Other Ambulatory Visit: Payer: Self-pay | Admitting: Cardiovascular Disease

## 2016-07-09 ENCOUNTER — Telehealth (HOSPITAL_COMMUNITY): Payer: Self-pay

## 2016-07-09 NOTE — Telephone Encounter (Signed)
Patient returned my call and left message on voicemail that he is declining cardiac rehab. Patient said he is currently working with a Clinical research associate and is doing all the things he is supposed to do. He appreciated that I contacted him and is not interested at this time. Referral canceled.

## 2016-07-09 NOTE — Telephone Encounter (Signed)
I called and left message on voicemail to call office about scheduling for Cardiac Rehab program. I left my contact information on patient voicemail.

## 2016-07-13 ENCOUNTER — Ambulatory Visit: Payer: Medicare HMO | Admitting: Oncology

## 2016-07-14 DIAGNOSIS — R0902 Hypoxemia: Secondary | ICD-10-CM | POA: Diagnosis not present

## 2016-07-18 ENCOUNTER — Encounter (HOSPITAL_COMMUNITY): Payer: Self-pay | Admitting: Emergency Medicine

## 2016-07-18 ENCOUNTER — Emergency Department (HOSPITAL_COMMUNITY)
Admission: EM | Admit: 2016-07-18 | Discharge: 2016-07-18 | Disposition: A | Discharge status: Home / Self Care | Payer: Medicare HMO | Attending: Emergency Medicine | Admitting: Emergency Medicine

## 2016-07-18 DIAGNOSIS — Y929 Unspecified place or not applicable: Secondary | ICD-10-CM | POA: Insufficient documentation

## 2016-07-18 DIAGNOSIS — Y33XXXA Other specified events, undetermined intent, initial encounter: Secondary | ICD-10-CM | POA: Insufficient documentation

## 2016-07-18 DIAGNOSIS — T161XXA Foreign body in right ear, initial encounter: Secondary | ICD-10-CM | POA: Diagnosis not present

## 2016-07-18 DIAGNOSIS — Y939 Activity, unspecified: Secondary | ICD-10-CM | POA: Insufficient documentation

## 2016-07-18 DIAGNOSIS — Y999 Unspecified external cause status: Secondary | ICD-10-CM | POA: Diagnosis not present

## 2016-07-18 NOTE — ED Triage Notes (Signed)
Pt reports that he went to sleep Thursday night with his hearing aid in. When pt woke up next morning part of hearing aid was out. Pt reports that dome of hearing aid remains in right ear. Pt has gone to hearing aid company, [facility], [facility] and urgent care who all said they could not remove the dome.

## 2016-07-18 NOTE — ED Notes (Signed)
Declined W/C at D/C and was escorted to lobby by RN. 

## 2016-07-18 NOTE — ED Triage Notes (Signed)
SN reported to Pt a forcep has been ordered from surgery  To remove  Object from ear. Pt reported are they making it.

## 2016-07-18 NOTE — ED Provider Notes (Signed)
Patient with tip of hearing aid lodged in right ear for 1 day. Offers no complaint. He is appears comfortable. On exam he is in no distress. Right ear external ears normal in ear canal there is no lesion. There is a plastic dome lodged in the ear canal.   [name], MD 07/18/16 1158

## 2016-07-23 ENCOUNTER — Ambulatory Visit (HOSPITAL_BASED_OUTPATIENT_CLINIC_OR_DEPARTMENT_OTHER): Payer: Medicare HMO | Admitting: Oncology

## 2016-07-23 ENCOUNTER — Telehealth: Payer: Self-pay | Admitting: Oncology

## 2016-07-23 VITALS — BP 128/64 | HR 77 | Temp 97.7°F | Resp 18 | Ht 71.0 in | Wt 191.9 lb

## 2016-07-23 DIAGNOSIS — I2510 Atherosclerotic heart disease of native coronary artery without angina pectoris: Secondary | ICD-10-CM | POA: Diagnosis not present

## 2016-07-23 DIAGNOSIS — C493 Malignant neoplasm of connective and soft tissue of thorax: Secondary | ICD-10-CM

## 2016-07-23 DIAGNOSIS — Z8572 Personal history of non-Hodgkin lymphomas: Secondary | ICD-10-CM

## 2016-07-23 DIAGNOSIS — C761 Malignant neoplasm of thorax: Secondary | ICD-10-CM | POA: Diagnosis not present

## 2016-07-23 NOTE — Telephone Encounter (Signed)
Scheduled appt per6/14 los - f/u in 8  Months.

## 2016-07-23 NOTE — Progress Notes (Signed)
  [facility] OFFICE PROGRESS NOTE   Diagnosis: Leiomyosarcoma, lymphoma  INTERVAL HISTORY:   [name] underwent reexcision of the chest wall sarcoma site in November. The pathology revealed no leiomyosarcoma and negative resection margins.  He developed exertional dyspnea and was evaluated by cardiology. He is not have recurrent coronary artery disease underwent placement of a RCA stent.  He is resuming an exercise program. No fever, night sweats, or anorexia.  Objective:  Vital signs in last 24 hours:  Blood pressure 128/64, pulse 77, temperature 97.7 F (36.5 C), temperature source Oral, resp. rate 18, height 5\' 11"  (1.803 m), weight 191 lb 14.4 oz (87 kg), SpO2 96 %.    HEENT:  neck without mass Lymphatics:  no cervical, supra-clavicular, axillary, or inguinal nodes Resp:  lungs clear bilaterally Cardio:  regular rate and rhythm GI:  no hepatosplenomegaly, no mass, nontender Vascular:  no leg edema  Skin: Mid Anterior chest surgical site without evidence of recurrent tumor   Medications: I have reviewed the patient's current medications.  Assessment/Plan: 1. Leiomyosarcoma of the anterior chest wall, status post an excisional biopsy on 11/11/2015 confirming a leiomyosarcoma ? Positive lateral surgical margin, 2 x 0.6 cm, up to 5 mitoses per 10 high-powered fields ? Reexcision 12/23/2015-no leiomyosarcoma, negative resection margins   2.   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   History of coronary artery disease/myocardial infarction-status post coronary artery bypass surgery in 2007, status post placement of a RCA stent May 2018   4.   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 He will return for an office visit in 8 months.  15 minutes were spent with the patient today. The majority of the time was used for counseling and  coordination of care.  Donneta Romberg, MD  07/23/2016  3:48 PM

## 2016-07-27 DIAGNOSIS — Z79891 Long term (current) use of opiate analgesic: Secondary | ICD-10-CM | POA: Diagnosis not present

## 2016-07-27 DIAGNOSIS — M47817 Spondylosis without myelopathy or radiculopathy, lumbosacral region: Secondary | ICD-10-CM | POA: Diagnosis not present

## 2016-07-27 DIAGNOSIS — M47816 Spondylosis without myelopathy or radiculopathy, lumbar region: Secondary | ICD-10-CM | POA: Diagnosis not present

## 2016-07-27 DIAGNOSIS — G894 Chronic pain syndrome: Secondary | ICD-10-CM | POA: Diagnosis not present

## 2016-07-27 DIAGNOSIS — Z79899 Other long term (current) drug therapy: Secondary | ICD-10-CM | POA: Diagnosis not present

## 2016-08-10 DIAGNOSIS — M7072 Other bursitis of hip, left hip: Secondary | ICD-10-CM | POA: Diagnosis not present

## 2016-08-10 DIAGNOSIS — M25552 Pain in left hip: Secondary | ICD-10-CM | POA: Diagnosis not present

## 2016-08-10 DIAGNOSIS — M791 Myalgia: Secondary | ICD-10-CM | POA: Diagnosis not present

## 2016-08-10 DIAGNOSIS — M47817 Spondylosis without myelopathy or radiculopathy, lumbosacral region: Secondary | ICD-10-CM | POA: Diagnosis not present

## 2016-08-11 ENCOUNTER — Telehealth: Payer: Self-pay | Admitting: Cardiovascular Disease

## 2016-08-11 NOTE — Telephone Encounter (Signed)
Joseph Soto is calling because he is having very bad long headaches and a broken blood vessel in his eye and since his heart attack years ago , he does not get headaches until recently . Wants to know should he came into see Dr. Angelena Form or go to his primary. Please call  .

## 2016-08-11 NOTE — Telephone Encounter (Signed)
Spoke with pt, his bp yesterday was 126/80. He feels like the blood vessel in the eye is due to irritation from rubbing. His headache has gotten a little better with the second dose of tylenol. Patient referred to medical doctor.

## 2016-08-13 DIAGNOSIS — R0902 Hypoxemia: Secondary | ICD-10-CM | POA: Diagnosis not present

## 2016-08-24 DIAGNOSIS — M47816 Spondylosis without myelopathy or radiculopathy, lumbar region: Secondary | ICD-10-CM | POA: Diagnosis not present

## 2016-08-24 DIAGNOSIS — G894 Chronic pain syndrome: Secondary | ICD-10-CM | POA: Diagnosis not present

## 2016-08-24 DIAGNOSIS — Z79899 Other long term (current) drug therapy: Secondary | ICD-10-CM | POA: Diagnosis not present

## 2016-08-24 DIAGNOSIS — Z79891 Long term (current) use of opiate analgesic: Secondary | ICD-10-CM | POA: Diagnosis not present

## 2016-08-28 NOTE — ED Provider Notes (Signed)
[facility] DEPT Provider Note   CSN: [id number] Arrival date & time: 07/18/16  1017     History   Chief Complaint Chief Complaint  Patient presents with  . Foreign Body in Ear    HPI Joseph Soto is a 71 y.o. male who presents with FB in his right ear. He states that the dome of the hearing aid is lodged in his ear from sleeping with his hearing aid in overnight. He went to [facility] and they did not feel comfortable removing the dome and advised him to come to the ED. He denies any other complaints other than wanting to have dome removed. No dizziness, ear drainage, or pain.  HPI  Past Medical History:  Diagnosis Date  . Buttock pain   . CAD (coronary artery disease)    Last cath 2010 per Dr. Doreatha Lew with diffuse multivessel disease, small caliber vessels not felt to be suitable for PCI  . Chronic left hip pain   . Dizziness    1 episode   . Fibromyalgia   . GERD (gastroesophageal reflux disease)   . Head pain    "not chronic; I have myofasial tightening in my head that causes pain in my skull" (06/17/2016)  . HLD (hyperlipidemia)   . Hypertension   . Myeloma ([place])    "found it in my chest when they did the excision"  . Myocardial infarction ([place]) 06/2005  . Non Hodgkin's lymphoma ([place]) dx'd 06/2005    Patient Active Problem List   Diagnosis Date Noted  . Unstable angina ([place])   . Leiomyosarcoma of chest wall ([place]) 12/19/2015  . Left low back pain 12/02/2015  . Chronic headache 12/02/2015  . Dyspnea on exertion 10/30/2013  . Positional vertigo 01/23/2013  . 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 CAD (coronary artery disease) 08/21/2010  . Ischemic cardiomyopathy 08/21/2010  . Hyperlipidemia 08/21/2010    Past Surgical History:  Procedure Laterality Date  . CARDIAC CATHETERIZATION  ~ 2008   "Dr. Doreatha Lew"  . CATARACT EXTRACTION W/ INTRAOCULAR LENS IMPLANT Right   . CORONARY ANGIOPLASTY WITH STENT PLACEMENT  06/17/2016    . CORONARY ARTERY BYPASS GRAFT  5/07   x5. LV dysfunction.   . CORONARY STENT INTERVENTION N/A 06/17/2016   Procedure: Coronary Stent Intervention;  Surgeon: Burnell Blanks, MD;  Location: [facility];  Service: Cardiovascular;  Laterality: N/A;  . LAPAROSCOPIC CHOLECYSTECTOMY    . LEFT HEART CATH AND CORS/GRAFTS ANGIOGRAPHY N/A 06/17/2016   Procedure: Left Heart Cath and Cors/Grafts Angiography;  Surgeon: Burnell Blanks, MD;  Location: [facility];  Service: Cardiovascular;  Laterality: N/A;  . MASS EXCISION N/A 11/11/2015   Procedure: EXCISION OF MID CHEST  MASS;  Surgeon: Judeth Horn, MD;  Location: [facility];  Service: General;  Laterality: N/A;  . MASS EXCISION N/A 12/23/2015   Procedure: REEXCISION OF CHEST WALL TUMOR SITE;  Surgeon: Judeth Horn, MD;  Location: [facility];  Service: General;  Laterality: N/A;  . POPLITEAL SYNOVIAL CYST EXCISION Right   . TONSILLECTOMY         Home Medications    Prior to Admission medications   Medication Sig Start Date End Date Taking? Authorizing Provider  aspirin EC 81 MG tablet Take 81 mg by mouth every evening.     [provider]  cholecalciferol (VITAMIN D) 1000 units tablet Take 1,000 Units by mouth daily.    [provider]  clopidogrel (PLAVIX) 75 MG tablet Take 1 tablet (75 mg total) by mouth daily with breakfast. 06/19/16   Reino Bellis B, NP  Coenzyme Q10 300 MG CAPS Take 300 mg by mouth daily.    [provider]  gabapentin (NEURONTIN) 300 MG capsule Take 300 mg by mouth 2 (two) times daily.  01/27/15   [provider]  L-Arginine 1000 MG TABS Take 1,000 mg by mouth 2 (two) times daily.    [provider]  Magnesium Gluconate 550 MG TABS Take 550 mg by mouth every Monday, Wednesday, and Friday.    [provider]  metoprolol succinate (TOPROL-XL) 25 MG 24 hr tablet Take 25 mg by mouth daily.    [provider]   Multiple Vitamin (MULTIVITAMIN) capsule Take 1 capsule by mouth daily.      [provider]  nitroGLYCERIN (NITROSTAT) 0.4 MG SL tablet Place 1 tablet (0.4 mg total) under the tongue every 5 (five) minutes as needed for chest pain. 06/18/16   Cheryln Manly, NP  Probiotic CAPS Take 1 capsule by mouth 2 (two) times daily.    [provider]  rosuvastatin (CRESTOR) 40 MG tablet Take 40 mg by mouth daily.    [provider]  tiZANidine (ZANAFLEX) 2 MG tablet Take by mouth 3 (three) times daily.    [provider]  traMADol (ULTRAM) 50 MG tablet Take 50 mg by mouth 3 (three) times daily.     [provider]  triamcinolone (NASACORT) 55 MCG/ACT AERO nasal inhaler Place 1 spray into the nose daily as needed (Allergies).    [provider]    Family History Family History  Problem Relation Age of Onset  . Heart disease Father   . Heart attack Father   . Dementia Mother     Social History Social History  Substance Use Topics  . Smoking status: Former Smoker    Packs/day: 1.50    Years: 20.00    Types: Cigarettes  . Smokeless tobacco: Never Used     Comment: "quit 06/2005"  . Alcohol use No     Allergies   Niacin and related and Niaspan [niacin er]   Review of Systems Review of Systems  HENT: Negative for ear discharge and ear pain.        +FB  Neurological: Negative for dizziness.     Physical Exam Updated Vital Signs BP (!) 158/83 (BP Location: Left Arm)   Pulse 81   Temp 97.5 F (36.4 C) (Oral)   Resp 16   Ht 5\' 11"  (1.803 m)   Wt 83.9 kg (185 lb)   SpO2 98%   BMI 25.80 kg/m   Physical Exam  Constitutional: He is oriented to person, place, and time. He appears well-developed and well-nourished. No distress.  HENT:  Head: Normocephalic and atraumatic.  Right Ear: A foreign body (hearing aid dome) is present.  Left Ear: Hearing, tympanic membrane, external ear and ear canal normal.  Eyes: Pupils are equal,  round, and reactive to light. Conjunctivae are normal. Right eye exhibits no discharge. Left eye exhibits no discharge. No scleral icterus.  Neck: Normal range of motion.  Cardiovascular: Normal rate.   Pulmonary/Chest: Effort normal. No respiratory distress.  Abdominal: He exhibits no distension.  Neurological: He is alert and oriented to person, place, and time.  Skin: Skin is warm and dry.  Psychiatric: He has a normal mood and affect. His behavior is normal.  Nursing note and vitals reviewed.  ED Treatments / Results  Labs (all labs ordered are listed, but only abnormal results are displayed) Labs Reviewed - No data to display  EKG  EKG Interpretation None       Radiology No results found.  Procedures .Foreign Body Removal Date/Time: 08/28/2016 3:10 PM Performed by: Recardo Evangelist Authorized by: Recardo Evangelist  Consent: Verbal consent obtained. Risks and benefits: risks, benefits and alternatives were discussed Consent given by: patient Patient understanding: patient states understanding of the procedure being performed Patient consent: the patient's understanding of the procedure matches consent given Patient identity confirmed: verbally with patient and arm band Time out: Immediately prior to procedure a "time out" was called to verify the correct patient, procedure, equipment, support staff and site/side marked as required. Body area: ear Location details: right ear Patient restrained: no Patient cooperative: yes Removal mechanism: alligator forceps 1 objects recovered. Objects recovered: hearing aid dome Post-procedure assessment: foreign body removed Patient tolerance: Patient tolerated the procedure well with no immediate complications   (including critical care time)   Medications Ordered in ED Medications - No data to display   Initial Impression / Assessment and Plan / ED Course  I have reviewed the triage vital signs and the nursing  notes.  Pertinent labs & imaging results that were available during my care of the patient were reviewed by me and considered in my medical decision making (see chart for details).  71 year old male with FB in right ear. It was successfully removed. Re-examination of the ear revealed TM is intact. Shared visit with Dr. Winfred Leeds.  Final Clinical Impressions(s) / ED Diagnoses   Final diagnoses:  Foreign body of right ear, initial encounter    New Prescriptions Discharge Medication List as of 07/18/2016 12:51 PM       Recardo Evangelist, PA-C 08/28/16 1512    [name], MD 08/31/16 1500

## 2016-09-13 DIAGNOSIS — R0902 Hypoxemia: Secondary | ICD-10-CM | POA: Diagnosis not present

## 2016-09-21 DIAGNOSIS — Z79899 Other long term (current) drug therapy: Secondary | ICD-10-CM | POA: Diagnosis not present

## 2016-09-21 DIAGNOSIS — G894 Chronic pain syndrome: Secondary | ICD-10-CM | POA: Diagnosis not present

## 2016-09-21 DIAGNOSIS — Z79891 Long term (current) use of opiate analgesic: Secondary | ICD-10-CM | POA: Diagnosis not present

## 2016-09-21 DIAGNOSIS — M47816 Spondylosis without myelopathy or radiculopathy, lumbar region: Secondary | ICD-10-CM | POA: Diagnosis not present

## 2016-09-24 DIAGNOSIS — Z125 Encounter for screening for malignant neoplasm of prostate: Secondary | ICD-10-CM | POA: Diagnosis not present

## 2016-09-24 DIAGNOSIS — I10 Essential (primary) hypertension: Secondary | ICD-10-CM | POA: Diagnosis not present

## 2016-09-24 DIAGNOSIS — E784 Other hyperlipidemia: Secondary | ICD-10-CM | POA: Diagnosis not present

## 2016-10-01 DIAGNOSIS — E784 Other hyperlipidemia: Secondary | ICD-10-CM | POA: Diagnosis not present

## 2016-10-01 DIAGNOSIS — R69 Illness, unspecified: Secondary | ICD-10-CM | POA: Diagnosis not present

## 2016-10-01 DIAGNOSIS — I10 Essential (primary) hypertension: Secondary | ICD-10-CM | POA: Diagnosis not present

## 2016-10-01 DIAGNOSIS — R0902 Hypoxemia: Secondary | ICD-10-CM | POA: Diagnosis not present

## 2016-10-01 DIAGNOSIS — I2510 Atherosclerotic heart disease of native coronary artery without angina pectoris: Secondary | ICD-10-CM | POA: Diagnosis not present

## 2016-10-01 DIAGNOSIS — M545 Low back pain: Secondary | ICD-10-CM | POA: Diagnosis not present

## 2016-10-01 DIAGNOSIS — C8590 Non-Hodgkin lymphoma, unspecified, unspecified site: Secondary | ICD-10-CM | POA: Diagnosis not present

## 2016-10-01 DIAGNOSIS — C499 Malignant neoplasm of connective and soft tissue, unspecified: Secondary | ICD-10-CM | POA: Diagnosis not present

## 2016-10-01 DIAGNOSIS — R51 Headache: Secondary | ICD-10-CM | POA: Diagnosis not present

## 2016-10-01 DIAGNOSIS — Z0000 Encounter for general adult medical examination without abnormal findings: Secondary | ICD-10-CM | POA: Diagnosis not present

## 2016-10-05 DIAGNOSIS — Z1212 Encounter for screening for malignant neoplasm of rectum: Secondary | ICD-10-CM | POA: Diagnosis not present

## 2016-10-08 ENCOUNTER — Other Ambulatory Visit: Payer: Self-pay | Admitting: Cardiovascular Disease

## 2016-10-14 DIAGNOSIS — R0902 Hypoxemia: Secondary | ICD-10-CM | POA: Diagnosis not present

## 2016-10-21 ENCOUNTER — Telehealth: Payer: Self-pay | Admitting: Cardiovascular Disease

## 2016-10-21 NOTE — Telephone Encounter (Signed)
We can change his Plavix to Effient 10 mg daily. No need to load. cdm

## 2016-10-21 NOTE — Telephone Encounter (Signed)
New Message    Pt c/o medication issue:  1. Name of Medication: clopidogrel (PLAVIX) 75 MG tablet Take 1 tablet (75 mg total) by mouth daily with breakfast.     2. How are you currently taking this medication (dosage and times per day)?  1x  A day   3. Are you having a reaction (difficulty breathing--STAT)?  yes  4. What is your medication issue? Sob , still having sob even though he is only taking the tramadol once a day , he thinks its the clopidogrel not the tramadol

## 2016-10-21 NOTE — Telephone Encounter (Signed)
Pt states he had cath and DES in May 2018 because of shortness of breath. Pt states he has continued to have shortness of breath since May 2018, same as before cath. Pt states  he started tramadol shortly after cath and thought that may also be contributing to shortness of breath. Pt is gradually decreasing dose of tramadol and shortness of breath has continued. Pt states when he exercises he still has shortness of breath but it does not get worse with exercise, he denies chest pain with exercise. Pt denies weight gain, LE edema, chest pain, lightheadedness. Pt states he read shortness of breath may be a side effect of clopidogrel.  Pt advised I will forward to Dr Angelena Form for review and recommendations about clopidogrel.

## 2016-10-22 ENCOUNTER — Encounter: Payer: Self-pay | Admitting: Cardiovascular Disease

## 2016-10-22 MED ORDER — PRASUGREL HCL 10 MG PO TABS: 10.0000 mg | ORAL_TABLET | Freq: Every day | ORAL | 3 refills | Status: AC

## 2016-10-22 NOTE — Telephone Encounter (Signed)
I spoke with pt and gave him information about stopping Plavix and starting Effient 10 mg daily. [facility] send [facility].

## 2016-10-22 NOTE — Telephone Encounter (Signed)
Left message to call back  

## 2016-11-08 DIAGNOSIS — R69 Illness, unspecified: Secondary | ICD-10-CM | POA: Diagnosis not present

## 2016-11-13 DIAGNOSIS — R0902 Hypoxemia: Secondary | ICD-10-CM | POA: Diagnosis not present

## 2016-12-14 DIAGNOSIS — R0902 Hypoxemia: Secondary | ICD-10-CM | POA: Diagnosis not present

## 2016-12-22 DIAGNOSIS — R0902 Hypoxemia: Secondary | ICD-10-CM | POA: Diagnosis not present

## 2016-12-22 DIAGNOSIS — C499 Malignant neoplasm of connective and soft tissue, unspecified: Secondary | ICD-10-CM | POA: Diagnosis not present

## 2016-12-22 DIAGNOSIS — H9201 Otalgia, right ear: Secondary | ICD-10-CM | POA: Diagnosis not present

## 2016-12-22 DIAGNOSIS — E7849 Other hyperlipidemia: Secondary | ICD-10-CM | POA: Diagnosis not present

## 2016-12-22 DIAGNOSIS — Z6827 Body mass index (BMI) 27.0-27.9, adult: Secondary | ICD-10-CM | POA: Diagnosis not present

## 2016-12-22 DIAGNOSIS — I2510 Atherosclerotic heart disease of native coronary artery without angina pectoris: Secondary | ICD-10-CM | POA: Diagnosis not present

## 2016-12-22 DIAGNOSIS — I10 Essential (primary) hypertension: Secondary | ICD-10-CM | POA: Diagnosis not present

## 2016-12-23 ENCOUNTER — Other Ambulatory Visit: Payer: Self-pay | Admitting: Neurology

## 2016-12-24 ENCOUNTER — Other Ambulatory Visit: Payer: Self-pay

## 2016-12-24 ENCOUNTER — Encounter (HOSPITAL_COMMUNITY): Payer: Self-pay | Admitting: Emergency Medicine

## 2016-12-24 ENCOUNTER — Emergency Department (HOSPITAL_COMMUNITY)
Admission: EM | Admit: 2016-12-24 | Discharge: 2016-12-24 | Disposition: A | Discharge status: Home / Self Care | Payer: Medicare HMO | Attending: Physician Assistant | Admitting: Physician Assistant

## 2016-12-24 DIAGNOSIS — Z955 Presence of coronary angioplasty implant and graft: Secondary | ICD-10-CM | POA: Diagnosis not present

## 2016-12-24 DIAGNOSIS — C8590 Non-Hodgkin lymphoma, unspecified, unspecified site: Secondary | ICD-10-CM | POA: Diagnosis not present

## 2016-12-24 DIAGNOSIS — Z87891 Personal history of nicotine dependence: Secondary | ICD-10-CM | POA: Diagnosis not present

## 2016-12-24 DIAGNOSIS — I10 Essential (primary) hypertension: Secondary | ICD-10-CM | POA: Diagnosis not present

## 2016-12-24 DIAGNOSIS — Y939 Activity, unspecified: Secondary | ICD-10-CM | POA: Insufficient documentation

## 2016-12-24 DIAGNOSIS — X58XXXA Exposure to other specified factors, initial encounter: Secondary | ICD-10-CM | POA: Diagnosis not present

## 2016-12-24 DIAGNOSIS — T161XXA Foreign body in right ear, initial encounter: Secondary | ICD-10-CM | POA: Diagnosis not present

## 2016-12-24 DIAGNOSIS — Y999 Unspecified external cause status: Secondary | ICD-10-CM | POA: Insufficient documentation

## 2016-12-24 DIAGNOSIS — I2510 Atherosclerotic heart disease of native coronary artery without angina pectoris: Secondary | ICD-10-CM | POA: Diagnosis not present

## 2016-12-24 DIAGNOSIS — I252 Old myocardial infarction: Secondary | ICD-10-CM | POA: Insufficient documentation

## 2016-12-24 DIAGNOSIS — Z7982 Long term (current) use of aspirin: Secondary | ICD-10-CM | POA: Insufficient documentation

## 2016-12-24 DIAGNOSIS — Y929 Unspecified place or not applicable: Secondary | ICD-10-CM | POA: Insufficient documentation

## 2016-12-24 DIAGNOSIS — Z79899 Other long term (current) drug therapy: Secondary | ICD-10-CM | POA: Diagnosis not present

## 2016-12-24 NOTE — ED Provider Notes (Signed)
Joseph Soto   CSN: [id number] Arrival date & time: 12/24/16  9562     History   Chief Complaint Chief Complaint  Patient presents with  . Otalgia    object in ear    HPI Joseph Soto is a 71 y.o. male.  HPI  Patient is a 71 year old male presenting with hearing aid in his right ear.  Patient reports dome of his hearing he has been stuck in his ear for 2 days.  He is tried multiple times to try to get out and is unable to.  He reports no pain no fever no other associated symptoms.  Past Medical History:  Diagnosis Date  . Buttock pain   . CAD (coronary artery disease)    Last cath 2010 per Dr. Doreatha Lew with diffuse multivessel disease, small caliber vessels not felt to be suitable for PCI  . Chronic left hip pain   . Dizziness    1 episode   . Fibromyalgia   . GERD (gastroesophageal reflux disease)   . Head pain    "not chronic; I have myofasial tightening in my head that causes pain in my skull" (06/17/2016)  . HLD (hyperlipidemia)   . Hypertension   . Myeloma ([place])    "found it in my chest when they did the excision"  . Myocardial infarction ([place]) 06/2005  . Non Hodgkin's lymphoma ([place]) dx'd 06/2005    Patient Active Problem List   Diagnosis Date Noted  . Unstable angina ([place])   . Leiomyosarcoma of chest wall ([place]) 12/19/2015  . Left low back pain 12/02/2015  . Chronic headache 12/02/2015  . Dyspnea on exertion 10/30/2013  . Positional vertigo 01/23/2013  . 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 CAD (coronary artery disease) 08/21/2010  . Ischemic cardiomyopathy 08/21/2010  . Hyperlipidemia 08/21/2010    Past Surgical History:  Procedure Laterality Date  . CARDIAC CATHETERIZATION  ~ 2008   "Dr. Doreatha Lew"  . CATARACT EXTRACTION W/ INTRAOCULAR LENS IMPLANT Right   . CORONARY ANGIOPLASTY WITH STENT PLACEMENT  06/17/2016  . CORONARY ARTERY BYPASS GRAFT  5/07   x5. LV dysfunction.   . CORONARY STENT INTERVENTION N/A 06/17/2016   Procedure: Coronary Stent Intervention;  Surgeon: Burnell Blanks, MD;  Location: [facility];  Service: Cardiovascular;  Laterality: N/A;  . LAPAROSCOPIC CHOLECYSTECTOMY    . LEFT HEART CATH AND CORS/GRAFTS ANGIOGRAPHY N/A 06/17/2016   Procedure: Left Heart Cath and Cors/Grafts Angiography;  Surgeon: Burnell Blanks, MD;  Location: [facility];  Service: Cardiovascular;  Laterality: N/A;  . MASS EXCISION N/A 11/11/2015   Procedure: EXCISION OF MID CHEST  MASS;  Surgeon: Judeth Horn, MD;  Location: [facility];  Service: General;  Laterality: N/A;  . MASS EXCISION N/A 12/23/2015   Procedure: REEXCISION OF CHEST WALL TUMOR SITE;  Surgeon: Judeth Horn, MD;  Location: [facility];  Service: General;  Laterality: N/A;  . POPLITEAL SYNOVIAL CYST EXCISION Right   . TONSILLECTOMY         Home Medications    Prior to Admission medications   Medication Sig Start Date End Date Taking? Authorizing Provider  aspirin EC 81 MG tablet Take 81 mg by mouth every evening.     [provider]  cholecalciferol (VITAMIN D) 1000 units tablet Take 1,000 Units by mouth daily.    [provider]  Coenzyme Q10 300 MG CAPS Take 300  mg by mouth daily.    [provider]  gabapentin (NEURONTIN) 300 MG capsule Take 300 mg by mouth 2 (two) times daily.  01/27/15   [provider]  L-Arginine 1000 MG TABS Take 1,000 mg by mouth 2 (two) times daily.    [provider]  Magnesium Gluconate 550 MG TABS Take 550 mg by mouth every Monday, Wednesday, and Friday.    [provider]  metoprolol succinate (TOPROL-XL) 25 MG 24 hr tablet Take 25 mg by mouth daily.    [provider]  Multiple Vitamin (MULTIVITAMIN) capsule Take 1 capsule by mouth daily.      [provider]  nitroGLYCERIN (NITROSTAT) 0.4 MG SL tablet Place 1 tablet (0.4 mg  total) under the tongue every 5 (five) minutes as needed for chest pain. 06/18/16   Cheryln Manly, NP  prasugrel (EFFIENT) 10 MG TABS tablet Take 1 tablet (10 mg total) by mouth daily. 10/22/16 01/20/17  Burnell Blanks, MD  Probiotic CAPS Take 1 capsule by mouth 2 (two) times daily.    [provider]  rosuvastatin (CRESTOR) 40 MG tablet TAKE 1 TABLET BY MOUTH EVERY DAY 10/08/16   Burnell Blanks, MD  tiZANidine (ZANAFLEX) 2 MG tablet Take by mouth 3 (three) times daily.    [provider]  traMADol (ULTRAM) 50 MG tablet Take 50 mg by mouth 3 (three) times daily.     [provider]  triamcinolone (NASACORT) 55 MCG/ACT AERO nasal inhaler Place 1 spray into the nose daily as needed (Allergies).    [provider]    Family History Family History  Problem Relation Age of Onset  . Heart disease Father   . Heart attack Father   . Dementia Mother     Social History Social History   Tobacco Use  . Smoking status: Former Smoker    Packs/day: 1.50    Years: 20.00    Pack years: 30.00    Types: Cigarettes  . Smokeless tobacco: Never Used  . Tobacco comment: "quit 06/2005"  Substance Use Topics  . Alcohol use: No    Alcohol/week: 0.0 oz  . Drug use: No     Allergies   Niacin and related and Niaspan [niacin er]   Review of Systems Review of Systems  Constitutional: Negative for activity change.  HENT: Positive for ear pain. Negative for ear discharge.   Respiratory: Negative for shortness of breath.   Cardiovascular: Negative for chest pain.  Gastrointestinal: Negative for abdominal pain.     Physical Exam Updated Vital Signs BP (!) 142/82 (BP Location: Right Arm)   Pulse 88   Temp 97.7 F (36.5 C) (Oral)   Resp 16   Ht 5\' 11"  (1.803 m)   Wt 81.6 kg (180 lb)   SpO2 99%   BMI 25.10 kg/m   Physical Exam  Constitutional: He is oriented to person, place, and time. He appears well-nourished.  HENT:  Head:  Normocephalic.  Right Ear: External ear normal.  Left Ear: External ear normal.  Plastic object in right ear.  Eyes: Conjunctivae and EOM are normal. Pupils are equal, round, and reactive to light.  Cardiovascular: Normal rate.  Pulmonary/Chest: Effort normal.  Neurological: He is oriented to person, place, and time.  Skin: Skin is warm and dry. He is not diaphoretic.  Psychiatric: He has a normal mood and affect. His behavior is normal.     ED Treatments / Results  Labs (all labs ordered are listed,  but only abnormal results are displayed) Labs Reviewed - No data to display  EKG  EKG Interpretation None       Radiology No results found.  Procedures .Foreign Body Removal Date/Time: 12/24/2016 8:48 AM Performed by: Macarthur Critchley, MD Authorized by: Macarthur Critchley, MD  Consent: Verbal consent obtained. Patient identity confirmed: verbally with patient Body area: ear Location details: right ear 1 objects recovered. Objects recovered: hearing aid cover thing Post-procedure assessment: foreign body removed Patient tolerance: Patient tolerated the procedure well with no immediate complications   (including critical care time)  Medications Ordered in ED Medications - No data to display   Initial Impression / Assessment and Plan / ED Course  I have reviewed the triage vital signs and the nursing notes.  Pertinent labs & imaging results that were available during my care of the patient were reviewed by me and considered in my medical decision making (see chart for details).      Patient is a 71 year old male presenting with hearing aid in his right ear.  Patient reports dome of his hearing he has been stuck in his ear for 2 days.  He is tried multiple times to try to get out and is unable to.  He reports no pain no fever no other associated symptoms.  FB successfully removed with alligator forceps.   Final Clinical Impressions(s) / ED Diagnoses    Final diagnoses:  None    ED Discharge Orders    None       Macarthur Critchley, MD 12/24/16 [phone]

## 2016-12-24 NOTE — ED Triage Notes (Signed)
Pt. Stated, hearing aid doom stuck in rt. Ear for 2 days.

## 2017-01-13 DIAGNOSIS — R0902 Hypoxemia: Secondary | ICD-10-CM | POA: Diagnosis not present

## 2017-01-22 ENCOUNTER — Ambulatory Visit: Payer: Medicare HMO | Admitting: Cardiovascular Disease

## 2017-01-22 ENCOUNTER — Encounter: Payer: Self-pay | Admitting: Cardiovascular Disease

## 2017-01-22 VITALS — BP 106/70 | HR 85 | Ht 71.0 in | Wt 199.0 lb

## 2017-01-22 DIAGNOSIS — E7800 Pure hypercholesterolemia, unspecified: Secondary | ICD-10-CM | POA: Diagnosis not present

## 2017-01-22 DIAGNOSIS — I25810 Atherosclerosis of coronary artery bypass graft(s) without angina pectoris: Secondary | ICD-10-CM | POA: Diagnosis not present

## 2017-01-22 NOTE — Progress Notes (Signed)
Chief Complaint  Patient presents with  . Follow-up    CAD  . Shortness of Breath     History of Present Illness: 71 yo male with history of CAD s/p CABG 2007, Non-hodgkins Lymphoma, HLD here today for cardiac follow up. He had a 4V CABG in 2007. Cath in 2010 showed occluded SVG to Diagonal and occluded SVG to PDA/PLA withpatent LIMA graft to the  LAD. There was diffuse disease in distal vessels felt best to be managed medically. 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 Echo April 2018 with LVEF=40-45% and hypokinesis of the anteroseptal wall and apex. Nuclear stress test April 2018 with possible ischemia. Cardiac cath 06/17/16 with patent LIMA to LAD, all other grafts known to be occluded. Severe stenosis in the proximal RCA treated with a drug eluting stent.   He is here today for follow up. The patient denies any chest pain, palpitations, lower extremity edema, orthopnea, PND, dizziness, near syncope or syncope. He has dyspnea when climbing stairs but none when walking. He plays golf and is very active.   Primary Care Physician: [name], MD  Past Medical History:  Diagnosis Date  . Buttock pain   . CAD (coronary artery disease)    Last cath 2010 per Dr. Doreatha Lew with diffuse multivessel disease, small caliber vessels not felt to be suitable for PCI  . Chronic left hip pain   . Dizziness    1 episode   . Fibromyalgia   . GERD (gastroesophageal reflux disease)   . Head pain    "not chronic; I have myofasial tightening in my head that causes pain in my skull" (06/17/2016)  . HLD (hyperlipidemia)   . Hypertension   . Myeloma ([place])    "found it in my chest when they did the excision"  . Myocardial infarction ([place]) 06/2005  . Non Hodgkin's lymphoma ([place]) dx'd 06/2005    Past Surgical History:  Procedure Laterality Date  . CARDIAC CATHETERIZATION  ~ 2008   "Dr. Doreatha Lew"  . CATARACT EXTRACTION W/ INTRAOCULAR LENS IMPLANT Right   . CORONARY  ANGIOPLASTY WITH STENT PLACEMENT  06/17/2016  . CORONARY ARTERY BYPASS GRAFT  5/07   x5. LV dysfunction.   . CORONARY STENT INTERVENTION N/A 06/17/2016   Procedure: Coronary Stent Intervention;  Surgeon: Burnell Blanks, MD;  Location: [facility];  Service: Cardiovascular;  Laterality: N/A;  . LAPAROSCOPIC CHOLECYSTECTOMY    . LEFT HEART CATH AND CORS/GRAFTS ANGIOGRAPHY N/A 06/17/2016   Procedure: Left Heart Cath and Cors/Grafts Angiography;  Surgeon: Burnell Blanks, MD;  Location: [facility];  Service: Cardiovascular;  Laterality: N/A;  . MASS EXCISION N/A 11/11/2015   Procedure: EXCISION OF MID CHEST  MASS;  Surgeon: Judeth Horn, MD;  Location: [facility];  Service: General;  Laterality: N/A;  . MASS EXCISION N/A 12/23/2015   Procedure: REEXCISION OF CHEST WALL TUMOR SITE;  Surgeon: Judeth Horn, MD;  Location: [facility];  Service: General;  Laterality: N/A;  . POPLITEAL SYNOVIAL CYST EXCISION Right   . TONSILLECTOMY      Current Outpatient Medications  Medication Sig Dispense Refill  . aspirin EC 81 MG tablet Take 81 mg by mouth every evening.     . cholecalciferol (VITAMIN D) 1000 units tablet Take 1,000 Units by mouth daily.    . clopidogrel (PLAVIX) 75 MG tablet Take 1 tablet by mouth daily.    . Coenzyme Q10 300  MG CAPS Take 300 mg by mouth daily.    . cyclobenzaprine (FLEXERIL) 10 MG tablet Take 10 mg by mouth 3 (three) times daily.    Marland Kitchen gabapentin (NEURONTIN) 300 MG capsule Take 300 mg by mouth 2 (two) times daily.   12  . L-Arginine 1000 MG TABS Take 1,000 mg by mouth 2 (two) times daily.    . Magnesium Gluconate 550 MG TABS Take 550 mg by mouth every Monday, Wednesday, and Friday.    . metoprolol succinate (TOPROL-XL) 25 MG 24 hr tablet Take 25 mg by mouth daily.    . Multiple Vitamin (MULTIVITAMIN) capsule Take 1 capsule by mouth daily.      . naltrexone (DEPADE) 50 MG tablet Take 1 tablet by mouth daily.    .  nitroGLYCERIN (NITROSTAT) 0.4 MG SL tablet Place 1 tablet (0.4 mg total) under the tongue every 5 (five) minutes as needed for chest pain. 25 tablet 3  . Probiotic CAPS Take 1 capsule by mouth 2 (two) times daily.    . rosuvastatin (CRESTOR) 40 MG tablet TAKE 1 TABLET BY MOUTH EVERY DAY 90 tablet 2  . triamcinolone (NASACORT) 55 MCG/ACT AERO nasal inhaler Place 1 spray into the nose daily as needed (Allergies).     No current facility-administered medications for this visit.     Allergies  Allergen Reactions  . Niacin And Related Nausea And Vomiting  . Niaspan [Niacin Er]     Stomach intolerance    Social History   Socioeconomic History  . Marital status: Married    Spouse name: [name]  . Number of children: 2  . Years of education: college  . Highest education level: Not on file  Social Needs  . Financial resource strain: Not on file  . Food insecurity - worry: Not on file  . Food insecurity - inability: Not on file  . Transportation needs - medical: Not on file  . Transportation needs - non-medical: Not on file  Occupational History  . Occupation: Camera operator  Tobacco Use  . Smoking status: Former Smoker    Packs/day: 1.50    Years: 20.00    Pack years: 30.00    Types: Cigarettes  . Smokeless tobacco: Never Used  . Tobacco comment: "quit 06/2005"  Substance and Sexual Activity  . Alcohol use: No    Alcohol/week: 0.0 oz  . Drug use: No  . Sexual activity: Not on file  Other Topics Concern  . Not on file  Social History Narrative   Married, 2 children.   Runs an executive recruiting company.    Left-handed.   Drinks a bottle of green tea.    Family History  Problem Relation Age of Onset  . Heart disease Father   . Heart attack Father   . Dementia Mother     Review of Systems:  As stated in the HPI and otherwise negative.   BP 106/70   Pulse 85   Ht 5\' 11"  (1.803 m)   Wt 199 lb (90.3 kg)   SpO2 96%   BMI 27.75 kg/m   Physical  Examination:  General: Well developed, well nourished, NAD  HEENT: OP clear, mucus membranes moist  SKIN: warm, dry. No rashes. Neuro: No focal deficits  Musculoskeletal: Muscle strength 5/5 all ext  Psychiatric: Mood and affect normal  Neck: No JVD, no carotid bruits, no thyromegaly, no lymphadenopathy.  Lungs:Clear bilaterally, no wheezes, rhonci, crackles Cardiovascular: Regular rate and rhythm. No murmurs, gallops or rubs. Abdomen:Soft. Bowel sounds  present. Non-tender.  Extremities: No lower extremity edema. Pulses are 2 + in the bilateral DP/PT.  Echo 05/18/16: Left ventricle: The cavity size was normal. There was mild focal   basal hypertrophy of the septum. Systolic function was mildly to   moderately reduced. The estimated ejection fraction was in the   range of 40% to 45%. Hypokinesis of the basal-mid anteroseptal   and anterior myocardium; consistent with ischemia in the   distribution of the left anterior descending coronary artery,   with a patent distal bypass. Doppler parameters are consistent   with abnormal left ventricular relaxation (grade 1 diastolic   dysfunction). - Ventricular septum: Septal motion showed paradox. - Right ventricle: Systolic function was mildly reduced.  EKG:  EKG is  ordered today. The ekg ordered today demonstrates    Recent Labs: 04/29/2016: NT-Pro BNP 217 06/18/2016: BUN 17; Creatinine, Ser 0.95; Hemoglobin 12.6; Platelets 191; Potassium 4.0; Sodium 137   Lipid Panel Followed in primary care   Wt Readings from Last 3 Encounters:  01/22/17 199 lb (90.3 kg)  12/24/16 180 lb (81.6 kg)  07/23/16 191 lb 14.4 oz (87 kg)     Other studies Reviewed: Additional studies/ records that were reviewed today include: . Review of the above records demonstrates:    Assessment and Plan:   1. CAD without angina: No chest pain suggestive of angina. Cardiac cath May 2018 with patent LIMA to LAD, severe disease in the RCA and moderately severe  disease in the circumflex. A drug eluting stent was placed in the RCA. He does have moderately severe disease in the Circumflex that is unchanged. Dyspnea is mild. Will continue ASA, Plavix, statin and beta blocker.     2. Hyperlipidemia:Continue statin. Lipids followed in primary care.   Current medicines are reviewed at length with the patient today.  The patient does not have concerns regarding medicines.  The following changes have been made:  no change  Labs/ tests ordered today include:   No orders of the defined types were placed in this encounter.   Disposition:   FU with me in 12  months  Signed, Lauree Chandler, MD 01/22/2017 1:25 PM    [facility] [address], [place], [place]  [address] Phone: [phone]; Fax: [phone]

## 2017-01-22 NOTE — Patient Instructions (Addendum)
Medication Instructions:  Your physician recommends that you continue on your current medications as directed. Please refer to the Current Medication list given to you today.   Labwork: none  Testing/Procedures: none  Follow-Up: Your physician recommends that you schedule a follow-up appointment in: 12 months. Please call our office in about 10 months to schedule this appointment    Any Other Special Instructions Will Be Listed Below (If Applicable).   Call us to let us know if you are taking Clopidogrel (Plavix) or Effient  If you need a refill on your cardiac medications before your next appointment, please call your pharmacy.

## 2017-02-13 DIAGNOSIS — R0902 Hypoxemia: Secondary | ICD-10-CM | POA: Diagnosis not present

## 2017-03-16 DIAGNOSIS — R0902 Hypoxemia: Secondary | ICD-10-CM | POA: Diagnosis not present

## 2017-03-19 ENCOUNTER — Other Ambulatory Visit: Payer: Self-pay | Admitting: Cardiovascular Disease

## 2017-03-25 ENCOUNTER — Ambulatory Visit: Payer: Medicare HMO | Admitting: Oncology

## 2017-03-30 ENCOUNTER — Ambulatory Visit: Payer: Medicare HMO | Admitting: Oncology

## 2017-04-05 ENCOUNTER — Inpatient Hospital Stay: Payer: Medicare HMO | Attending: Oncology | Admitting: Oncology

## 2017-04-05 ENCOUNTER — Telehealth: Payer: Self-pay | Admitting: Oncology

## 2017-04-05 VITALS — BP 133/72 | HR 83 | Temp 97.8°F | Resp 18 | Ht 71.0 in | Wt 200.9 lb

## 2017-04-05 DIAGNOSIS — Z8589 Personal history of malignant neoplasm of other organs and systems: Secondary | ICD-10-CM

## 2017-04-05 DIAGNOSIS — E785 Hyperlipidemia, unspecified: Secondary | ICD-10-CM | POA: Diagnosis not present

## 2017-04-05 DIAGNOSIS — I2510 Atherosclerotic heart disease of native coronary artery without angina pectoris: Secondary | ICD-10-CM | POA: Diagnosis not present

## 2017-04-05 DIAGNOSIS — Z8572 Personal history of non-Hodgkin lymphomas: Secondary | ICD-10-CM | POA: Insufficient documentation

## 2017-04-05 DIAGNOSIS — I252 Old myocardial infarction: Secondary | ICD-10-CM

## 2017-04-05 DIAGNOSIS — C493 Malignant neoplasm of connective and soft tissue of thorax: Secondary | ICD-10-CM

## 2017-04-05 NOTE — Telephone Encounter (Signed)
Scheduled appt per 2/25 los - patient is aware of appt -  My chart active.

## 2017-04-05 NOTE — Progress Notes (Signed)
  [facility] OFFICE PROGRESS NOTE   Diagnosis: Leiomyosarcoma, non-Hodgkin's lymphoma  INTERVAL HISTORY:   Mr. Joseph Soto returns for a scheduled visit.  He feels well.  No fever or night sweats.  No palpable lymph nodes.  He complains of a sharp pain behind the left ear that occurs every few weeks and last for approximately 1 minute.  No associated symptoms.  Objective:  Vital signs in last 24 hours:  Blood pressure 133/72, pulse 83, temperature 97.8 F (36.6 C), temperature source Oral, resp. rate 18, height 5\' 11"  (1.803 m), weight 200 lb 14.4 oz (91.1 kg), SpO2 98 %.    HEENT: Neck without mass, examination of the scalp behind the left ear is unremarkable.  Mild tenderness over the skull at the retroauricular area.  No mass. Lymphatics: No cervical, supraclavicular, axillary, or inguinal nodes Resp: Lungs clear bilaterally Cardio: Regular rate and rhythm GI: No hepatosplenomegaly, no mass, nontender Vascular: No leg edema  Skin: Anterior chest scar without evidence of recurrent tumor  Medications: I have reviewed the patient's current medications.   Assessment/Plan: 1. Leiomyosarcoma of the anterior chest wall, status post an excisional biopsy on 11/11/2015 confirming a leiomyosarcoma ? Positive lateral surgical margin, 2 x 0.6 cm, up to 5 mitoses per 10 high-powered fields ? Reexcision 12/23/2015-no leiomyosarcoma, negative resection margins   2. 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 History of coronary artery disease/myocardial infarction-status post coronary artery bypass surgery in 2007, status post placement of a RCA stent May 2018   4. Hyperlipidemia  Disposition: Mr. [name] remains in clinical remission from lymphoma and leiomyosarcoma.  He will return for an office visit in 1 year.  He will contact us in the interim for new symptoms.  He will seek medical attention if the left ear discomfort  persists.  15 minutes were spent with the patient today.  The majority of the time was used for counseling and coordination of care.  Betsy Coder, MD  04/05/2017  3:47 PM

## 2017-04-11 ENCOUNTER — Encounter: Payer: Self-pay | Admitting: Cardiovascular Disease

## 2017-04-13 DIAGNOSIS — R0902 Hypoxemia: Secondary | ICD-10-CM | POA: Diagnosis not present

## 2017-06-11 ENCOUNTER — Other Ambulatory Visit: Payer: Self-pay

## 2017-06-11 ENCOUNTER — Emergency Department (HOSPITAL_COMMUNITY): Payer: Medicare HMO

## 2017-06-11 ENCOUNTER — Observation Stay (HOSPITAL_COMMUNITY)
Admission: EM | Admit: 2017-06-11 | Discharge: 2017-06-12 | Disposition: A | Discharge status: Home / Self Care | Payer: Medicare HMO | Attending: Internal Medicine | Admitting: Internal Medicine

## 2017-06-11 ENCOUNTER — Encounter (HOSPITAL_COMMUNITY): Payer: Self-pay

## 2017-06-11 DIAGNOSIS — Z7982 Long term (current) use of aspirin: Secondary | ICD-10-CM | POA: Diagnosis not present

## 2017-06-11 DIAGNOSIS — I252 Old myocardial infarction: Secondary | ICD-10-CM | POA: Diagnosis not present

## 2017-06-11 DIAGNOSIS — I5042 Chronic combined systolic (congestive) and diastolic (congestive) heart failure: Secondary | ICD-10-CM | POA: Insufficient documentation

## 2017-06-11 DIAGNOSIS — Z7902 Long term (current) use of antithrombotics/antiplatelets: Secondary | ICD-10-CM | POA: Insufficient documentation

## 2017-06-11 DIAGNOSIS — R0789 Other chest pain: Secondary | ICD-10-CM

## 2017-06-11 DIAGNOSIS — I2510 Atherosclerotic heart disease of native coronary artery without angina pectoris: Secondary | ICD-10-CM | POA: Insufficient documentation

## 2017-06-11 DIAGNOSIS — R1011 Right upper quadrant pain: Secondary | ICD-10-CM | POA: Diagnosis not present

## 2017-06-11 DIAGNOSIS — R7989 Other specified abnormal findings of blood chemistry: Secondary | ICD-10-CM | POA: Diagnosis not present

## 2017-06-11 DIAGNOSIS — J439 Emphysema, unspecified: Secondary | ICD-10-CM | POA: Diagnosis not present

## 2017-06-11 DIAGNOSIS — Z951 Presence of aortocoronary bypass graft: Secondary | ICD-10-CM | POA: Insufficient documentation

## 2017-06-11 DIAGNOSIS — E785 Hyperlipidemia, unspecified: Secondary | ICD-10-CM | POA: Diagnosis not present

## 2017-06-11 DIAGNOSIS — N179 Acute kidney failure, unspecified: Secondary | ICD-10-CM | POA: Diagnosis not present

## 2017-06-11 DIAGNOSIS — R079 Chest pain, unspecified: Secondary | ICD-10-CM | POA: Diagnosis not present

## 2017-06-11 DIAGNOSIS — Z8579 Personal history of other malignant neoplasms of lymphoid, hematopoietic and related tissues: Secondary | ICD-10-CM | POA: Diagnosis not present

## 2017-06-11 DIAGNOSIS — R109 Unspecified abdominal pain: Secondary | ICD-10-CM | POA: Diagnosis not present

## 2017-06-11 DIAGNOSIS — I110 Hypertensive heart disease with heart failure: Secondary | ICD-10-CM | POA: Diagnosis not present

## 2017-06-11 DIAGNOSIS — Z8572 Personal history of non-Hodgkin lymphomas: Secondary | ICD-10-CM | POA: Insufficient documentation

## 2017-06-11 DIAGNOSIS — I255 Ischemic cardiomyopathy: Secondary | ICD-10-CM | POA: Diagnosis not present

## 2017-06-11 DIAGNOSIS — Z7951 Long term (current) use of inhaled steroids: Secondary | ICD-10-CM | POA: Insufficient documentation

## 2017-06-11 DIAGNOSIS — Z85831 Personal history of malignant neoplasm of soft tissue: Secondary | ICD-10-CM | POA: Insufficient documentation

## 2017-06-11 DIAGNOSIS — M797 Fibromyalgia: Secondary | ICD-10-CM | POA: Insufficient documentation

## 2017-06-11 DIAGNOSIS — Z955 Presence of coronary angioplasty implant and graft: Secondary | ICD-10-CM | POA: Diagnosis not present

## 2017-06-11 DIAGNOSIS — C493 Malignant neoplasm of connective and soft tissue of thorax: Secondary | ICD-10-CM | POA: Diagnosis present

## 2017-06-11 DIAGNOSIS — Z79899 Other long term (current) drug therapy: Secondary | ICD-10-CM | POA: Diagnosis not present

## 2017-06-11 DIAGNOSIS — R0602 Shortness of breath: Secondary | ICD-10-CM | POA: Diagnosis not present

## 2017-06-11 DIAGNOSIS — Z87891 Personal history of nicotine dependence: Secondary | ICD-10-CM | POA: Insufficient documentation

## 2017-06-11 LAB — I-STAT TROPONIN, ED: Troponin i, poc: 0.01 ng/mL (ref 0.00–0.08)

## 2017-06-11 LAB — BASIC METABOLIC PANEL
Anion gap: 10 (ref 5–15)
BUN: 32 mg/dL — ABNORMAL HIGH (ref 6–20)
CO2: 22 mmol/L (ref 22–32)
Calcium: 9.5 mg/dL (ref 8.9–10.3)
Chloride: 109 mmol/L (ref 101–111)
Creatinine, Ser: 1.86 mg/dL — ABNORMAL HIGH (ref 0.61–1.24)
GFR calc Af Amer: 40 mL/min — ABNORMAL LOW (ref >60)
GFR calc non Af Amer: 34 mL/min — ABNORMAL LOW (ref >60)
Glucose, Bld: 117 mg/dL — ABNORMAL HIGH (ref 65–99)
Potassium: 4.5 mmol/L (ref 3.5–5.1)
Sodium: 141 mmol/L (ref 135–145)

## 2017-06-11 LAB — CBC
HCT: 40.7 % (ref 39.0–52.0)
Hemoglobin: 13.6 g/dL (ref 13.0–17.0)
MCH: 29.6 pg (ref 26.0–34.0)
MCHC: 33.4 g/dL (ref 30.0–36.0)
MCV: 88.5 fL (ref 78.0–100.0)
Platelets: 307 10*3/uL (ref 150–400)
RBC: 4.6 MIL/uL (ref 4.22–5.81)
RDW: 15 % (ref 11.5–15.5)
WBC: 10.2 10*3/uL (ref 4.0–10.5)

## 2017-06-11 NOTE — ED Triage Notes (Signed)
Pt endorses shob that began this morning with some right sided chest pain and radiation to the right arm. Pt had hx of MI 10 years ago and had stent placed last year. WHen pt had MI he had right shoulder/arm pain. VSS

## 2017-06-11 NOTE — ED Notes (Signed)
Results reviewed.  No changes in acuity at this time 

## 2017-06-12 ENCOUNTER — Observation Stay (HOSPITAL_COMMUNITY): Payer: Medicare HMO

## 2017-06-12 ENCOUNTER — Encounter (HOSPITAL_COMMUNITY): Payer: Self-pay | Admitting: Internal Medicine

## 2017-06-12 ENCOUNTER — Other Ambulatory Visit: Payer: Self-pay

## 2017-06-12 DIAGNOSIS — N179 Acute kidney failure, unspecified: Secondary | ICD-10-CM

## 2017-06-12 DIAGNOSIS — R079 Chest pain, unspecified: Secondary | ICD-10-CM | POA: Diagnosis not present

## 2017-06-12 DIAGNOSIS — R109 Unspecified abdominal pain: Secondary | ICD-10-CM | POA: Diagnosis not present

## 2017-06-12 DIAGNOSIS — I25810 Atherosclerosis of coronary artery bypass graft(s) without angina pectoris: Secondary | ICD-10-CM

## 2017-06-12 DIAGNOSIS — R0789 Other chest pain: Secondary | ICD-10-CM | POA: Diagnosis not present

## 2017-06-12 LAB — URINALYSIS, ROUTINE W REFLEX MICROSCOPIC
Bilirubin Urine: NEGATIVE
Glucose, UA: NEGATIVE mg/dL
Hgb urine dipstick: NEGATIVE
Ketones, ur: 5 mg/dL — AB
Leukocytes, UA: NEGATIVE
Nitrite: NEGATIVE
Protein, ur: NEGATIVE mg/dL
Specific Gravity, Urine: 1.015 (ref 1.005–1.030)
pH: 5 (ref 5.0–8.0)

## 2017-06-12 LAB — CBC
HCT: 39.2 % (ref 39.0–52.0)
Hemoglobin: 13 g/dL (ref 13.0–17.0)
MCH: 29.3 pg (ref 26.0–34.0)
MCHC: 33.2 g/dL (ref 30.0–36.0)
MCV: 88.3 fL (ref 78.0–100.0)
Platelets: 243 10*3/uL (ref 150–400)
RBC: 4.44 MIL/uL (ref 4.22–5.81)
RDW: 14.8 % (ref 11.5–15.5)
WBC: 10.8 10*3/uL — ABNORMAL HIGH (ref 4.0–10.5)

## 2017-06-12 LAB — HEPATIC FUNCTION PANEL
ALT: 16 U/L — ABNORMAL LOW (ref 17–63)
AST: 23 U/L (ref 15–41)
Albumin: 3.6 g/dL (ref 3.5–5.0)
Alkaline Phosphatase: 45 U/L (ref 38–126)
Bilirubin, Direct: <0.1 mg/dL — ABNORMAL LOW (ref 0.1–0.5)
Total Bilirubin: 0.7 mg/dL (ref 0.3–1.2)
Total Protein: 6.3 g/dL — ABNORMAL LOW (ref 6.5–8.1)

## 2017-06-12 LAB — CREATININE, SERUM
Creatinine, Ser: 1.38 mg/dL — ABNORMAL HIGH (ref 0.61–1.24)
GFR calc Af Amer: 57 mL/min — ABNORMAL LOW (ref >60)
GFR calc non Af Amer: 49 mL/min — ABNORMAL LOW (ref >60)

## 2017-06-12 LAB — SODIUM, URINE, RANDOM: Sodium, Ur: 60 mmol/L

## 2017-06-12 LAB — CREATININE, URINE, RANDOM: Creatinine, Urine: 117.37 mg/dL

## 2017-06-12 LAB — TROPONIN I
Troponin I: <0.03 ng/mL (ref <0.03)
Troponin I: <0.03 ng/mL (ref <0.03)
Troponin I: <0.03 ng/mL (ref <0.03)

## 2017-06-12 LAB — MRSA PCR SCREENING: MRSA by PCR: NEGATIVE

## 2017-06-12 LAB — D-DIMER, QUANTITATIVE (NOT AT ARMC): D-Dimer, Quant: 1.04 ug/mL-FEU — ABNORMAL HIGH (ref 0.00–0.50)

## 2017-06-12 LAB — LIPASE, BLOOD: Lipase: 34 U/L (ref 11–51)

## 2017-06-12 MED ORDER — NALTREXONE HCL 50 MG PO TABS
50.0000 mg | ORAL_TABLET | Freq: Every day | ORAL | Status: DC
  Administered 2017-06-12: 50 mg via ORAL
  Filled 2017-06-12: qty 1

## 2017-06-12 MED ORDER — CLOPIDOGREL BISULFATE 75 MG PO TABS
75.0000 mg | ORAL_TABLET | Freq: Every day | ORAL | Status: DC
  Administered 2017-06-12: 75 mg via ORAL
  Filled 2017-06-12: qty 1

## 2017-06-12 MED ORDER — ENOXAPARIN SODIUM 40 MG/0.4ML ~~LOC~~ SOLN
40.0000 mg | SUBCUTANEOUS | Status: DC
  Filled 2017-06-12: qty 0.4

## 2017-06-12 MED ORDER — VITAMIN D 1000 UNITS PO TABS
1000.0000 [IU] | ORAL_TABLET | Freq: Every day | ORAL | Status: DC
  Administered 2017-06-12: 1000 [IU] via ORAL
  Filled 2017-06-12: qty 1

## 2017-06-12 MED ORDER — SODIUM CHLORIDE 0.9 % IV SOLN: INTRAVENOUS | Status: DC

## 2017-06-12 MED ORDER — NITROGLYCERIN 0.4 MG SL SUBL: 0.4000 mg | SUBLINGUAL_TABLET | SUBLINGUAL | Status: DC | PRN

## 2017-06-12 MED ORDER — TECHNETIUM TC 99M DIETHYLENETRIAME-PENTAACETIC ACID
32.0000 | Freq: Once | INTRAVENOUS | Status: AC | PRN
  Administered 2017-06-12: 32 via INTRAVENOUS

## 2017-06-12 MED ORDER — METOPROLOL SUCCINATE ER 25 MG PO TB24
25.0000 mg | ORAL_TABLET | Freq: Every day | ORAL | Status: DC
Start: 2017-06-12 — End: 2017-06-12
  Administered 2017-06-12: 25 mg via ORAL
  Filled 2017-06-12: qty 1

## 2017-06-12 MED ORDER — ONDANSETRON HCL 4 MG/2ML IJ SOLN: 4.0000 mg | Freq: Four times a day (QID) | INTRAMUSCULAR | Status: DC | PRN

## 2017-06-12 MED ORDER — MAGNESIUM GLUCONATE 500 MG PO TABS: 500.0000 mg | ORAL_TABLET | ORAL | Status: DC

## 2017-06-12 MED ORDER — SODIUM CHLORIDE 0.9 % IV SOLN
INTRAVENOUS | Status: DC
  Administered 2017-06-12 (×2): via INTRAVENOUS

## 2017-06-12 MED ORDER — SODIUM CHLORIDE 0.9 % IV BOLUS
500.0000 mL | Freq: Once | INTRAVENOUS | Status: AC
  Administered 2017-06-12: 500 mL via INTRAVENOUS

## 2017-06-12 MED ORDER — ACETAMINOPHEN 325 MG PO TABS
650.0000 mg | ORAL_TABLET | ORAL | Status: DC | PRN
  Administered 2017-06-12 (×2): 650 mg via ORAL
  Filled 2017-06-12 (×2): qty 2

## 2017-06-12 MED ORDER — ROSUVASTATIN CALCIUM 40 MG PO TABS
40.0000 mg | ORAL_TABLET | Freq: Every day | ORAL | Status: DC
  Administered 2017-06-12: 40 mg via ORAL
  Filled 2017-06-12: qty 2
  Filled 2017-06-12: qty 1

## 2017-06-12 MED ORDER — CYCLOBENZAPRINE HCL 10 MG PO TABS
10.0000 mg | ORAL_TABLET | Freq: Every morning | ORAL | Status: DC
  Administered 2017-06-12: 10 mg via ORAL
  Filled 2017-06-12: qty 1

## 2017-06-12 MED ORDER — ASPIRIN EC 81 MG PO TBEC: 81.0000 mg | DELAYED_RELEASE_TABLET | Freq: Every evening | ORAL | Status: DC

## 2017-06-12 MED ORDER — TECHNETIUM TO 99M ALBUMIN AGGREGATED
4.2000 | Freq: Once | INTRAVENOUS | Status: AC | PRN
  Administered 2017-06-12: 4.2 via INTRAVENOUS

## 2017-06-12 NOTE — Consult Note (Addendum)
Cardiology Consultation:   Patient ID: Joseph Soto; [id number]; 1945/10/15   Admit date: 06/11/2017 Date of Consult: 06/12/2017  Primary Care Provider: [name], MD Primary Cardiologist: Lauree Chandler, MD  Primary Electrophysiologist:     Patient Profile:   Joseph Soto is a 72 y.o. male with a hx of CAD s/p CABG x 4 in 5093, chronic systolic and diastolic heart failure, non-hodgkins lymphoma, HLD who is being seen today for the evaluation of chest pain at the request of Dr. Lonny Prude.  History of Present Illness:   Joseph Soto is known to this service and last saw Dr. Angelena Form 01/22/17. At that time, he was doing well and remained active. Following a abnormal nuclear stress test, heart cath in 05/2016 with only LIMA-LAD patent and DES to proximal RCA with medical management for Cx and distal RCA branches. He was maintained on ASA and plavix.   He presented to [facility] 06/12/17 with right-sided lower rib cage pain that radiated up to his right arm. This developed when he finished golfing/driving range. He states this happened about 1 week ago and lasted 30 min, resolved spontaneously. The pain yesterday lasted about 5 hours in total and was not relieved with nitro. He has the pain again today. The pain is located in his lower right rib cage area and is now radiating to his right lower abdomen. He does not have any chest pain or palpitations, no diaphoresis, nausea or vomiting. He got an inversion table for christmas to potentially help with his sciatica. He recently discovered that he could do abdominal crunches on the inversion table. Of note, his renal function was reduced with sCr on admission 1.86, now recovered to 1.38.   Past Medical History:  Diagnosis Date  . Buttock pain   . CAD (coronary artery disease)    Last cath 2010 per Dr. Doreatha Lew with diffuse multivessel disease, small caliber vessels not felt to be suitable for PCI  . Chronic left hip pain   . Dizziness    1  episode   . Fibromyalgia   . GERD (gastroesophageal reflux disease)   . Head pain    "not chronic; I have myofasial tightening in my head that causes pain in my skull" (06/17/2016)  . HLD (hyperlipidemia)   . Hypertension   . Myeloma ([place])    "found it in my chest when they did the excision"  . Myocardial infarction ([place]) 06/2005  . Non Hodgkin's lymphoma ([place]) dx'd 06/2005    Past Surgical History:  Procedure Laterality Date  . CARDIAC CATHETERIZATION  ~ 2008   "Dr. Doreatha Lew"  . CATARACT EXTRACTION W/ INTRAOCULAR LENS IMPLANT Right   . CORONARY ANGIOPLASTY WITH STENT PLACEMENT  06/17/2016  . CORONARY ARTERY BYPASS GRAFT  5/07   x5. LV dysfunction.   . CORONARY STENT INTERVENTION N/A 06/17/2016   Procedure: Coronary Stent Intervention;  Surgeon: Burnell Blanks, MD;  Location: [facility];  Service: Cardiovascular;  Laterality: N/A;  . LAPAROSCOPIC CHOLECYSTECTOMY    . LEFT HEART CATH AND CORS/GRAFTS ANGIOGRAPHY N/A 06/17/2016   Procedure: Left Heart Cath and Cors/Grafts Angiography;  Surgeon: Burnell Blanks, MD;  Location: [facility];  Service: Cardiovascular;  Laterality: N/A;  . MASS EXCISION N/A 11/11/2015   Procedure: EXCISION OF MID CHEST  MASS;  Surgeon: Judeth Horn, MD;  Location: [facility];  Service: General;  Laterality: N/A;  . MASS EXCISION N/A 12/23/2015   Procedure: REEXCISION OF CHEST WALL TUMOR SITE;  Surgeon: Judeth Horn, MD;  Location: [facility];  Service: General;  Laterality: N/A;  . POPLITEAL SYNOVIAL CYST EXCISION Right   . TONSILLECTOMY       Home Medications:  Prior to Admission medications   Medication Sig Start Date End Date Taking? Authorizing Provider  aspirin EC 81 MG tablet Take 81 mg by mouth every evening.    Yes [provider]  cholecalciferol (VITAMIN D) 1000 units tablet Take 1,000 Units by mouth daily.   Yes [provider]  clopidogrel (PLAVIX) 75 MG tablet Take 75 mg  by mouth daily.  03/24/10  Yes [provider]  Coenzyme Q10 300 MG CAPS Take 300 mg by mouth daily.   Yes [provider]  cyclobenzaprine (FLEXERIL) 10 MG tablet Take 10 mg by mouth every morning.    Yes [provider]  L-Arginine 1000 MG TABS Take 1,000 mg by mouth 2 (two) times daily.   Yes [provider]  Magnesium Gluconate 550 MG TABS Take 550 mg by mouth every Monday, Wednesday, and Friday.   Yes [provider]  metoprolol succinate (TOPROL-XL) 25 MG 24 hr tablet TAKE 1 TABLET BY MOUTH DAILY. 03/19/17  Yes Burnell Blanks, MD  naltrexone (DEPADE) 50 MG tablet Take 50 mg by mouth daily.  01/17/17  Yes [provider]  nitroGLYCERIN (NITROSTAT) 0.4 MG SL tablet Place 1 tablet (0.4 mg total) under the tongue every 5 (five) minutes as needed for chest pain. 06/18/16  Yes Reino Bellis B, NP  Probiotic CAPS Take 1 capsule by mouth 2 (two) times daily.   Yes [provider]  rosuvastatin (CRESTOR) 40 MG tablet TAKE 1 TABLET BY MOUTH EVERY DAY Patient taking differently: Take 40 mg by mouth at bedtime 10/08/16  Yes Burnell Blanks, MD  triamcinolone (NASACORT) 55 MCG/ACT AERO nasal inhaler Place 1 spray into the nose daily as needed (for allergies).    Yes [provider]    Inpatient Medications: Scheduled Meds: . aspirin EC  81 mg Oral QPM  . cholecalciferol  1,000 Units Oral Daily  . clopidogrel  75 mg Oral Daily  . cyclobenzaprine  10 mg Oral q morning - 10a  . enoxaparin (LOVENOX) injection  40 mg Subcutaneous Q24H  . [START ON 06/14/2017] magnesium gluconate  500 mg Oral Q M,W,F  . metoprolol succinate  25 mg Oral Daily  . naltrexone  50 mg Oral Daily  . rosuvastatin  40 mg Oral Daily   Continuous Infusions: . sodium chloride 100 mL/hr at 06/12/17 0600   PRN Meds: acetaminophen, nitroGLYCERIN, ondansetron (ZOFRAN) IV  Allergies:    Allergies  Allergen Reactions  . Niacin And Related Nausea  And Vomiting  . Niaspan [Niacin Er] Nausea And Vomiting and Other (See Comments)    Stomach intolerance    Social History:   Social History   Socioeconomic History  . Marital status: Married    Spouse name: [name]  . Number of children: 2  . Years of education: college  . Highest education level: Not on file  Occupational History  . Occupation: Camera operator  Social Needs  . Financial resource strain: Not on file  . Food insecurity:    Worry: Not on file    Inability: Not on file  . Transportation needs:    Medical: Not on file    Non-medical: Not on file  Tobacco Use  . Smoking status: Former Smoker    Packs/day: 1.50    Years:  20.00    Pack years: 30.00    Types: Cigarettes  . Smokeless tobacco: Never Used  . Tobacco comment: "quit 06/2005"  Substance and Sexual Activity  . Alcohol use: No    Alcohol/week: 0.0 oz  . Drug use: No  . Sexual activity: Not on file  Lifestyle  . Physical activity:    Days per week: Not on file    Minutes per session: Not on file  . Stress: Not on file  Relationships  . Social connections:    Talks on phone: Not on file    Gets together: Not on file    Attends religious service: Not on file    Active member of club or organization: Not on file    Attends meetings of clubs or organizations: Not on file    Relationship status: Not on file  . Intimate partner violence:    Fear of current or ex partner: Not on file    Emotionally abused: Not on file    Physically abused: Not on file    Forced sexual activity: Not on file  Other Topics Concern  . Not on file  Social History Narrative   Married, 2 children.   Runs an executive recruiting company.    Left-handed.   Drinks a bottle of green tea.    Family History:    Family History  Problem Relation Age of Onset  . Heart disease Father   . Heart attack Father   . Dementia Mother      ROS:  Please see the history of present illness.   All other ROS reviewed and  negative.     Physical Exam/Data:   Vitals:   06/12/17 0330 06/12/17 0349 06/12/17 0445 06/12/17 0741  BP:  (!) 144/74 129/73 127/71  Pulse:   68 80  Resp: 14 20 11 14   Temp:   97.6 F (36.4 C) 97.7 F (36.5 C)  TempSrc:   Oral Oral  SpO2: 98%  100% 98%  Weight:   188 lb 11.2 oz (85.6 kg)   Height:   6' (1.829 m)     Intake/Output Summary (Last 24 hours) at 06/12/2017 0805 Last data filed at 06/12/2017 0700 Gross per 24 hour  Intake 600 ml  Output 0 ml  Net 600 ml   Filed Weights   06/11/17 1809 06/12/17 0445  Weight: 190 lb (86.2 kg) 188 lb 11.2 oz (85.6 kg)   Body mass index is 25.59 kg/m.  General:  Well nourished, well developed, in no acute distress HEENT: normal Neck: no JVD Vascular: No carotid bruits; FA pulses 2+ bilaterally without bruits  Cardiac:  normal S1, S2; RRR; no murmur Lungs:  clear to auscultation bilaterally, no wheezing, rhonchi or rales  Abd: soft, nontender, no hepatomegaly  Ext: no edema Musculoskeletal:  No deformities, BUE and BLE strength normal and equal Skin: warm and dry  Neuro:  CNs 2-12 intact, no focal abnormalities noted Psych:  Normal affect   EKG:  The EKG was personally reviewed and demonstrates:  NSR, unchanged Telemetry:  Telemetry was personally reviewed and demonstrates:  NSR  Relevant CV Studies:  Left heart cath 06/17/16:  Prox RCA to Mid RCA lesion, 40 %stenosed.  Ost RPDA to RPDA lesion, 70 %stenosed.  Dist RCA-1 lesion, 50 %stenosed.  Dist RCA-2 lesion, 50 %stenosed.  Ost LM to LM lesion, 40 %stenosed.  Ost Cx to Prox Cx lesion, 80 %stenosed.  LM lesion, 40 %stenosed.  Ost LAD to Prox LAD lesion,  90 %stenosed.  Mid LAD lesion, 100 %stenosed.  LIMA graft was visualized by angiography and is normal in caliber.  SVG graft was not injected.  Origin lesion, 100 %stenosed.  SVG graft was not injected.  Origin to Prox Graft lesion, 100 %stenosed.  A STENT SYNERGY DES 4X12 drug eluting stent was  successfully placed.  Prox RCA lesion, 99 %stenosed.  Post intervention, there is a 0% residual stenosis.  There is mild left ventricular systolic dysfunction.  LV end diastolic pressure is normal.  The left ventricular ejection fraction is 45-50% by visual estimate.  There is no mitral valve regurgitation.   1. Triple vessel CAD s/p 4V CABG with 1/4 patent bypass grafts 2. Chronic mid LAD occlusion with patent LIMA graft to the LAD 3. Moderately severe proximal Circumflex stenosis in a small to moderate caliber vessel. Overall unchanged from last cath in 2010. The vein graft to the intermediate branch is known to be occluded by the prior cath.  4. Severe stenosis in the moderately calcified proximal RCA. Moderate stenosis in the PDA/PLA branches, overall unchanged from last cath in 2010. The sequential vein graft to the PDA and PLA is known to be occluded by prior cath.  5. Moderate left main stenosis 6. Successful PTCA/DES x 1 proximal RCA  7. Mild LV systolic dysfunction  Recommendations: Continue DAPT with ASA and Plavix for at least one year. Continue statin and beta blocker. Medical management of disease in Circumflex and distal RCA branches.    Echo 05/28/16: Study Conclusions - Left ventricle: The cavity size was normal. There was mild focal   basal hypertrophy of the septum. Systolic function was mildly to   moderately reduced. The estimated ejection fraction was in the   range of 40% to 45%. Hypokinesis of the basal-mid anteroseptal   and anterior myocardium; consistent with ischemia in the   distribution of the left anterior descending coronary artery,   with a patent distal bypass. Doppler parameters are consistent   with abnormal left ventricular relaxation (grade 1 diastolic   dysfunction). - Ventricular septum: Septal motion showed paradox. - Right ventricle: Systolic function was mildly reduced.   Laboratory Data:  Chemistry Recent Labs  Lab 06/11/17 1822  06/12/17 0409  NA 141  --   K 4.5  --   CL 109  --   CO2 22  --   GLUCOSE 117*  --   BUN 32*  --   CREATININE 1.86* 1.38*  CALCIUM 9.5  --   GFRNONAA 34* 49*  GFRAA 40* 57*  ANIONGAP 10  --     Recent Labs  Lab 06/12/17 0409  PROT 6.3*  ALBUMIN 3.6  AST 23  ALT 16*  ALKPHOS 45  BILITOT 0.7   Hematology Recent Labs  Lab 06/11/17 1822 06/12/17 0409  WBC 10.2 10.8*  RBC 4.60 4.44  HGB 13.6 13.0  HCT 40.7 39.2  MCV 88.5 88.3  MCH 29.6 29.3  MCHC 33.4 33.2  RDW 15.0 14.8  PLT 307 243   Cardiac Enzymes Recent Labs  Lab 06/12/17 0409  TROPONINI <0.03    Recent Labs  Lab 06/11/17 1840  TROPIPOC 0.01    BNPNo results for input(s): BNP, PROBNP in the last 168 hours.  DDimer  Recent Labs  Lab 06/12/17 0409  DDIMER 1.04*    Radiology/Studies:  Dg Chest 2 View  Result Date: 06/11/2017 CLINICAL DATA:  Patient experiencing right side lower chest pains moving into right arm, and SOB. Symptoms started today  after approximately 1 hour at the driving range. Symptoms had passed by time of CXR. Patient reports a prior Myocardial inf.*comment was truncated* EXAM: CHEST - 2 VIEW COMPARISON:  11/07/2015 FINDINGS: Sternotomy wires overlie normal cardiac silhouette. Hyperinflated lungs. Normal pulmonary vasculature. No effusion, infiltrate, or pneumothorax. No acute osseous abnormality. IMPRESSION: Hyperinflated lungs.  No acute findings. Electronically Signed   By: [name] M.D.   On: 06/11/2017 19:30   Ct Renal Stone Study  Result Date: 06/12/2017 CLINICAL DATA:  72 y/o  M; right flank pain. EXAM: CT ABDOMEN AND PELVIS WITHOUT CONTRAST TECHNIQUE: Multidetector CT imaging of the abdomen and pelvis was performed following the standard protocol without IV contrast. COMPARISON:  01/23/2013 CT abdomen and pelvis FINDINGS: Lower chest: Mild centrilobular emphysema of the lung bases. Hepatobiliary: No focal liver abnormality is seen. Status post cholecystectomy. No biliary  dilatation. Pancreas: Unremarkable. No pancreatic ductal dilatation or surrounding inflammatory changes. Spleen: Normal in size without focal abnormality. Adrenals/Urinary Tract: Adrenal glands are unremarkable. Right kidney lower pole punctate nonobstructing stone. kidneys are otherwise normal, without additional renal calculi, focal lesion, or hydronephrosis. Bladder is unremarkable. Stomach/Bowel: Stomach is within normal limits. Appendix appears normal. No evidence of bowel wall thickening, distention, or inflammatory changes. Moderate diverticulosis without findings of acute diverticulitis. Vascular/Lymphatic: Aortic atherosclerosis. No enlarged abdominal or pelvic lymph nodes. 2.6 cm infrarenal abdominal aorta. Reproductive: Prostate is unremarkable. Other: No abdominal wall hernia or abnormality. No abdominopelvic ascites. Musculoskeletal: No fracture is seen. Mild multilevel discogenic degenerative changes of the spine. IMPRESSION: 1. Right kidney lower pole punctate nonobstructing stone. No ureter stone or hydronephrosis identified. 2. Mild centrilobular emphysema of the lung bases. 3. Diverticulosis without findings of acute diverticulitis. 4. 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 Ectatic abdominal aorta at risk for aneurysm development. Recommend followup by ultrasound in 5 years. This recommendation follows ACR consensus guidelines: White Paper of the ACR Incidental Findings Committee II on Vascular Findings. J Am Coll Radiol 2013; 10:789-794. Electronically Signed   By: Kristine Garbe M.D.   On: 06/12/2017 04:31    Assessment and Plan:   1. Right-sided lower rib cage and upper abdominal pain - troponin x 2 negative - EKG unchanged from previous - the pain is worse with palpation to his lower rib cage/upper abdominal area - given his recent change in exercise, negative troponin, no ischemia on EKG, pain with palpation on exam, and recent heart cath  06/2016 - I have a low suspicion for an ACS process - he is compliant on ASA, plavix, toprol, and crestor - will continue to trend troponins, but will defer further cardiac workup unless troponin is positive - will discuss with attending   2. Chronic systolic and diastolic heart failure - echo with EF of 40-45% and grade 1 DD - pt is euvolemic on exam   3. Renal impairment - creatinine improving - per IM   For questions or updates, please contact [place] Please consult [email or web address] for contact info under Cardiology/STEMI.   Signed, Hannah, PA  06/12/2017 8:05 AM   I have seen, examined the patient, and reviewed the above assessment and plan.  Changes to above are made where necessary.  On exam, markedly tender along R lower costal border which reproduces his symptoms.  This is not cardiac in origin.  Further workup per primary team.  Cardiology to see as needed.  Co Sign: Thompson Grayer, MD 06/12/2017 9:26 AM

## 2017-06-12 NOTE — H&P (Signed)
History and Physical    MASATO PETTIE GLO:[id number] DOB: Jun 16, 1945 DOA: 06/11/2017  PCP: [name], MD  Patient coming from: Home.  Chief Complaint: Chest pain.  HPI: Joseph Soto is a 72 y.o. male with history of CAD status post CABG and stenting in May 2018, history of leiomyosarcoma and lymphoma in remission, fibromyalgia presents with complaints of right-sided chest pain.  Patient's chest pain started last evening around 6 PM after patient returned home playing golf.  Pain is dull aching right sided lower ribs persistent no relation to exertion or breathing.  Denies any productive cough fever or chills.  Had some shortness of breath.  Patient took nitroglycerin following which chest pain did not improve.  Had to come to the ER.  ED Course: In the ER chest pain resolved without any intervention.  EKG shows normal sinus rhythm troponin was negative.  Given history of CAD and patient stating that he had similar episode when he has a last myocardial infarction patient admitted for further observation.  Patient denies any abdominal pain nausea vomiting or diarrhea.  Abdomen appears benign on exam.  Patient's labs also show acute renal failure.  Review of Systems: As per HPI, rest all negative.   Past Medical History:  Diagnosis Date  . Buttock pain   . CAD (coronary artery disease)    Last cath 2010 per Dr. Doreatha Lew with diffuse multivessel disease, small caliber vessels not felt to be suitable for PCI  . Chronic left hip pain   . Dizziness    1 episode   . Fibromyalgia   . GERD (gastroesophageal reflux disease)   . Head pain    "not chronic; I have myofasial tightening in my head that causes pain in my skull" (06/17/2016)  . HLD (hyperlipidemia)   . Hypertension   . Myeloma ([place])    "found it in my chest when they did the excision"  . Myocardial infarction ([place]) 06/2005  . Non Hodgkin's lymphoma ([place]) dx'd 06/2005    Past Surgical History:  Procedure Laterality Date    . CARDIAC CATHETERIZATION  ~ 2008   "Dr. Doreatha Lew"  . CATARACT EXTRACTION W/ INTRAOCULAR LENS IMPLANT Right   . CORONARY ANGIOPLASTY WITH STENT PLACEMENT  06/17/2016  . CORONARY ARTERY BYPASS GRAFT  5/07   x5. LV dysfunction.   . CORONARY STENT INTERVENTION N/A 06/17/2016   Procedure: Coronary Stent Intervention;  Surgeon: Burnell Blanks, MD;  Location: [facility];  Service: Cardiovascular;  Laterality: N/A;  . LAPAROSCOPIC CHOLECYSTECTOMY    . LEFT HEART CATH AND CORS/GRAFTS ANGIOGRAPHY N/A 06/17/2016   Procedure: Left Heart Cath and Cors/Grafts Angiography;  Surgeon: Burnell Blanks, MD;  Location: [facility];  Service: Cardiovascular;  Laterality: N/A;  . MASS EXCISION N/A 11/11/2015   Procedure: EXCISION OF MID CHEST  MASS;  Surgeon: Judeth Horn, MD;  Location: [facility];  Service: General;  Laterality: N/A;  . MASS EXCISION N/A 12/23/2015   Procedure: REEXCISION OF CHEST WALL TUMOR SITE;  Surgeon: Judeth Horn, MD;  Location: [facility];  Service: General;  Laterality: N/A;  . POPLITEAL SYNOVIAL CYST EXCISION Right   . TONSILLECTOMY       reports that he has quit smoking. His smoking use included cigarettes. He has a 30.00 pack-year smoking history. He has never used smokeless tobacco. He reports that he does not drink alcohol or use drugs.  Allergies  Allergen Reactions  . Niacin And Related Nausea  And Vomiting  . Niaspan [Niacin Er] Nausea And Vomiting and Other (See Comments)    Stomach intolerance    Family History  Problem Relation Age of Onset  . Heart disease Father   . Heart attack Father   . Dementia Mother     Prior to Admission medications   Medication Sig Start Date End Date Taking? Authorizing Provider  aspirin EC 81 MG tablet Take 81 mg by mouth every evening.    Yes [provider]  cholecalciferol (VITAMIN D) 1000 units tablet Take 1,000 Units by mouth daily.   Yes [provider]   clopidogrel (PLAVIX) 75 MG tablet Take 75 mg by mouth daily.  03/24/10  Yes [provider]  Coenzyme Q10 300 MG CAPS Take 300 mg by mouth daily.   Yes [provider]  cyclobenzaprine (FLEXERIL) 10 MG tablet Take 10 mg by mouth every morning.    Yes [provider]  L-Arginine 1000 MG TABS Take 1,000 mg by mouth 2 (two) times daily.   Yes [provider]  Magnesium Gluconate 550 MG TABS Take 550 mg by mouth every Monday, Wednesday, and Friday.   Yes [provider]  metoprolol succinate (TOPROL-XL) 25 MG 24 hr tablet TAKE 1 TABLET BY MOUTH DAILY. 03/19/17  Yes Burnell Blanks, MD  naltrexone (DEPADE) 50 MG tablet Take 50 mg by mouth daily.  01/17/17  Yes [provider]  nitroGLYCERIN (NITROSTAT) 0.4 MG SL tablet Place 1 tablet (0.4 mg total) under the tongue every 5 (five) minutes as needed for chest pain. 06/18/16  Yes Reino Bellis B, NP  Probiotic CAPS Take 1 capsule by mouth 2 (two) times daily.   Yes [provider]  rosuvastatin (CRESTOR) 40 MG tablet TAKE 1 TABLET BY MOUTH EVERY DAY Patient taking differently: Take 40 mg by mouth at bedtime 10/08/16  Yes Burnell Blanks, MD  triamcinolone (NASACORT) 55 MCG/ACT AERO nasal inhaler Place 1 spray into the nose daily as needed (for allergies).    Yes [provider]    Physical Exam: Vitals:   06/12/17 0245 06/12/17 0300 06/12/17 0330 06/12/17 0349  BP:    (!) 144/74  Pulse:      Resp: 15 13 14 20   Temp:      TempSrc:      SpO2:   98%   Weight:      Height:          Constitutional: Moderately built and nourished. Vitals:   06/12/17 0245 06/12/17 0300 06/12/17 0330 06/12/17 0349  BP:    (!) 144/74  Pulse:      Resp: 15 13 14 20   Temp:      TempSrc:      SpO2:   98%   Weight:      Height:       Eyes: Anicteric no pallor. ENMT: No discharge from the ears eyes nose or mouth. Neck: No mass palpated no neck rigidity but no JVD  appreciated. Respiratory: No rhonchi or crepitations. Cardiovascular: S1-S2 heard no murmurs appreciated. Abdomen: Soft nontender bowel sounds present.  No guarding or rigidity. Musculoskeletal: No edema.  No joint effusion. Skin: No rash.  Skin appears. Neurologic: Alert awake oriented to time place and person.  Moves all extremities. Psychiatric: Appears normal.  Normal affect.   Labs on Admission: I have personally reviewed following labs and imaging studies  CBC: Recent Labs  Lab 06/11/17 1822  WBC 10.2  HGB 13.6  HCT 40.7  MCV 88.5  PLT 465   Basic Metabolic Panel: Recent Labs  Lab 06/11/17 1822  NA 141  K 4.5  CL 109  CO2 22  GLUCOSE 117*  BUN 32*  CREATININE 1.86*  CALCIUM 9.5   GFR: Estimated Creatinine Clearance: 38.8 mL/min (A) (by C-G formula based on SCr of 1.86 mg/dL (H)). Liver Function Tests: No results for input(s): AST, ALT, ALKPHOS, BILITOT, PROT, ALBUMIN in the last 168 hours. No results for input(s): LIPASE, AMYLASE in the last 168 hours. No results for input(s): AMMONIA in the last 168 hours. Coagulation Profile: No results for input(s): INR, PROTIME in the last 168 hours. Cardiac Enzymes: No results for input(s): CKTOTAL, CKMB, CKMBINDEX, TROPONINI in the last 168 hours. BNP (last 3 results) No results for input(s): PROBNP in the last 8760 hours. HbA1C: No results for input(s): HGBA1C in the last 72 hours. CBG: No results for input(s): GLUCAP in the last 168 hours. Lipid Profile: No results for input(s): CHOL, HDL, LDLCALC, TRIG, CHOLHDL, LDLDIRECT in the last 72 hours. Thyroid Function Tests: No results for input(s): TSH, T4TOTAL, FREET4, T3FREE, THYROIDAB in the last 72 hours. Anemia Panel: No results for input(s): VITAMINB12, FOLATE, FERRITIN, TIBC, IRON, RETICCTPCT in the last 72 hours. Urine analysis: No results found for: COLORURINE, APPEARANCEUR, LABSPEC, PHURINE, GLUCOSEU, HGBUR, BILIRUBINUR, KETONESUR, PROTEINUR, UROBILINOGEN,  NITRITE, LEUKOCYTESUR Sepsis Labs: @LABRCNTIP (procalcitonin:4,lacticidven:4) )No results found for this or any previous visit (from the past 240 hour(s)).   Radiological Exams on Admission: Dg Chest 2 View  Result Date: 06/11/2017 CLINICAL DATA:  Patient experiencing right side lower chest pains moving into right arm, and SOB. Symptoms started today after approximately 1 hour at the driving range. Symptoms had passed by time of CXR. Patient reports a prior Myocardial inf.*comment was truncated* EXAM: CHEST - 2 VIEW COMPARISON:  11/07/2015 FINDINGS: Sternotomy wires overlie normal cardiac silhouette. Hyperinflated lungs. Normal pulmonary vasculature. No effusion, infiltrate, or pneumothorax. No acute osseous abnormality. IMPRESSION: Hyperinflated lungs.  No acute findings. Electronically Signed   By: [name] M.D.   On: 06/11/2017 19:30    EKG: Independently reviewed.  Normal sinus rhythm.  Assessment/Plan Principal Problem:   Chest pain Active Problems:   CAD (coronary artery disease)   Ischemic cardiomyopathy   Leiomyosarcoma of chest wall (HCC)   ARF (acute renal failure) ([place])    1. Right-sided chest pain with history of CAD status post CABG and stenting in May 2018 -chest pain appears atypical.  However patient states he had similar chest pain when he had myocardial infarction.  We will cycle cardiac markers check d-dimer.  If d-dimer is elevated will get VQ scan.  If work-up is negative and still has chest pain given history of leiomyosarcoma we will get a CT chest.  Requested cardiology consult.  Patient is on aspirin Plavix metoprolol and statins.  PRN nitroglycerin.  Check 2D echo.  I have also ordered CT renal study since patient has acute renal failure and pain is on the right lower part of the ribs.  Check LFTs lipase. 2. Acute renal failure -cause not clear.  Patient is receiving IV fluids.  Check UA urine FENa.  Not on any nephrotoxic medications. 3. History of ischemic  cardiomyopathy appears to be compensated.  Follow 2D echo. 4. History of lymphoma in remission.   DVT prophylaxis: Lovenox. Code Status: Full code. Family Communication: Discussed with patient. Disposition Plan: Home. Consults called: Cardiology. Admission status: Observation.   Rise Patience MD [facility] Pager [phone].  If 7PM-7AM, please contact night-coverage [email or web address] Password [email or web address]  06/12/2017, 4:00 AM

## 2017-06-12 NOTE — Progress Notes (Signed)
PROGRESS NOTE-not a billable note [name] [id number]:[id number] DOB: Jan 28, 1946 DOA: 06/11/2017 PCP: [name], MD  HPI  Joseph Soto is a 72 y.o. year old male with medical history significant for CAD with CABG (2007) and DES to RCA (2018), non-Hodgkin's lymphoma, hyperlipidemia who presented on 06/11/2017 with right-sided shoulder and abdominal pain (he reports right shoulder pain consistent with prior MI)  and was present for observation for ACS rule out.  Pain started in his right lower belly and radiates to his right shoulder with associated dyspnea while golfing.  He admits to increase abdominal exercising in the last 3 weeks on his inversion table.  Otherwise he is a fairly active gentleman: Runs a treadmill for several miles throughout the week.  Interval History This morning he states the pain is most prominent in his right lower abdomen   ROS: No chest pain, right belly pain, no current dyspnea    Subjective No chest pain currently.  Mild right belly pain.  Assessment/Plan: Principal Problem:   Chest pain Active Problems:   CAD (coronary artery disease)   Ischemic cardiomyopathy   Leiomyosarcoma of chest wall (HCC)   ARF (acute renal failure) ([place])   1.  ACS rule out.  Atypical chest pain, suspect most likely MSK related to increased abdominal exercises given reproducibility on exam.  Significant CAD history however troponin trend so farnegative EKG unremarkable.  Appreciate cardiology Rec.  Follow-up TTE.  2.  Right lower quadrant abdominal pain.  Suspect muscle strain related to increased abdominal or exercising.  CT scan negative for obstructing renal stone.  LFTs and lipase within normal limits.  No other acute intra-abdominal pathology on imaging.  3.  Dyspnea with elevated d-dimer.  AKI so unable to get CTA.  Follow VQ scan (does have emphysema, chronic smoker history)  4.  AKI, unclear etiology.  Suspect prerenal given improving with IV fluids in 24 hours.   No findings on UA, FENa 0.5%  5.  CAD status post CABG (2007), DES to RCA (2018).  Suspect less likely cardiac etiology.  Appreciate cardiology recommendations.  Continue home aspirin, metoprolol and Plavix  6.  Lipidemia: Stable.  Continue Crestor  7.  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  Followed by Dr. Benay Spice, in clinical remission    Code Status: Full code  Family Communication: No family at bedside  Disposition Plan: Awaiting cardiology recommendations, follow-up VQ scan, monitor creatinine   Consultants:  Cardiology  Procedures:  Pending TTE and VQ scan  Antimicrobials:  None  Cultures:  None  Telemetry: Yes  DVT prophylaxis: Lovenox   Objective: Vitals:   06/12/17 0330 06/12/17 0349 06/12/17 0445 06/12/17 0741  BP:  (!) 144/74 129/73 127/71  Pulse:   68 80  Resp: 14 20 11 14   Temp:   97.6 F (36.4 C) 97.7 F (36.5 C)  TempSrc:   Oral Oral  SpO2: 98%  100% 98%  Weight:   85.6 kg (188 lb 11.2 oz)   Height:   6' (1.829 m)     Intake/Output Summary (Last 24 hours) at 06/12/2017 0813 Last data filed at 06/12/2017 0700 Gross per 24 hour  Intake 600 ml  Output 0 ml  Net 600 ml   Filed Weights   06/11/17 1809 06/12/17 0445  Weight: 86.2 kg (190 lb) 85.6 kg (188 lb 11.2 oz)    Exam:  Constitutional:normal appearing male ENMT: Oropharynx with moist mucous membranes, normal dentition Cardiovascular: RRR no MRGs, with  no peripheral edema Respiratory: Normal respiratory effort, clear breath sounds  Abdomen: Soft, reproducible tenderness in the right lower quadrant, negative Murphy sign, no rebound or guarding Skin: No rash ulcers, or lesions. Without skin tenting  Neurologic: Grossly no focal neuro deficit. Psychiatric:Appropriate affect, and mood. Mental status AAOx3  Data Reviewed: CBC: Recent Labs  Lab 06/11/17 1822 06/12/17 0409  WBC 10.2 10.8*  HGB 13.6 13.0  HCT 40.7 39.2  MCV 88.5 88.3  PLT  307 542   Basic Metabolic Panel: Recent Labs  Lab 06/11/17 1822 06/12/17 0409  NA 141  --   K 4.5  --   CL 109  --   CO2 22  --   GLUCOSE 117*  --   BUN 32*  --   CREATININE 1.86* 1.38*  CALCIUM 9.5  --    GFR: Estimated Creatinine Clearance: 52.3 mL/min (A) (by C-G formula based on SCr of 1.38 mg/dL (H)). Liver Function Tests: Recent Labs  Lab 06/12/17 0409  AST 23  ALT 16*  ALKPHOS 45  BILITOT 0.7  PROT 6.3*  ALBUMIN 3.6   Recent Labs  Lab 06/12/17 0409  LIPASE 34   No results for input(s): AMMONIA in the last 168 hours. Coagulation Profile: No results for input(s): INR, PROTIME in the last 168 hours. Cardiac Enzymes: Recent Labs  Lab 06/12/17 0409  TROPONINI <0.03   BNP (last 3 results) No results for input(s): PROBNP in the last 8760 hours. HbA1C: No results for input(s): HGBA1C in the last 72 hours. CBG: No results for input(s): GLUCAP in the last 168 hours. Lipid Profile: No results for input(s): CHOL, HDL, LDLCALC, TRIG, CHOLHDL, LDLDIRECT in the last 72 hours. Thyroid Function Tests: No results for input(s): TSH, T4TOTAL, FREET4, T3FREE, THYROIDAB in the last 72 hours. Anemia Panel: No results for input(s): VITAMINB12, FOLATE, FERRITIN, TIBC, IRON, RETICCTPCT in the last 72 hours. Urine analysis:    Component Value Date/Time   COLORURINE YELLOW 06/12/2017 0308   APPEARANCEUR CLEAR 06/12/2017 0308   LABSPEC 1.015 06/12/2017 0308   PHURINE 5.0 06/12/2017 0308   GLUCOSEU NEGATIVE 06/12/2017 0308   HGBUR NEGATIVE 06/12/2017 0308   BILIRUBINUR NEGATIVE 06/12/2017 0308   KETONESUR 5 (A) 06/12/2017 0308   PROTEINUR NEGATIVE 06/12/2017 0308   NITRITE NEGATIVE 06/12/2017 0308   LEUKOCYTESUR NEGATIVE 06/12/2017 0308   Sepsis Labs: @LABRCNTIP (procalcitonin:4,lacticidven:4)  )No results found for this or any previous visit (from the past 240 hour(s)).    Studies: Dg Chest 2 View  Result Date: 06/11/2017 CLINICAL DATA:  Patient experiencing  right side lower chest pains moving into right arm, and SOB. Symptoms started today after approximately 1 hour at the driving range. Symptoms had passed by time of CXR. Patient reports a prior Myocardial inf.*comment was truncated* EXAM: CHEST - 2 VIEW COMPARISON:  11/07/2015 FINDINGS: Sternotomy wires overlie normal cardiac silhouette. Hyperinflated lungs. Normal pulmonary vasculature. No effusion, infiltrate, or pneumothorax. No acute osseous abnormality. IMPRESSION: Hyperinflated lungs.  No acute findings. Electronically Signed   By: [name] M.D.   On: 06/11/2017 19:30   Ct Renal Stone Study  Result Date: 06/12/2017 CLINICAL DATA:  72 y/o  M; right flank pain. EXAM: CT ABDOMEN AND PELVIS WITHOUT CONTRAST TECHNIQUE: Multidetector CT imaging of the abdomen and pelvis was performed following the standard protocol without IV contrast. COMPARISON:  01/23/2013 CT abdomen and pelvis FINDINGS: Lower chest: Mild centrilobular emphysema of the lung bases. Hepatobiliary: No focal liver abnormality is seen. Status post cholecystectomy. No biliary dilatation. Pancreas:  Unremarkable. No pancreatic ductal dilatation or surrounding inflammatory changes. Spleen: Normal in size without focal abnormality. Adrenals/Urinary Tract: Adrenal glands are unremarkable. Right kidney lower pole punctate nonobstructing stone. kidneys are otherwise normal, without additional renal calculi, focal lesion, or hydronephrosis. Bladder is unremarkable. Stomach/Bowel: Stomach is within normal limits. Appendix appears normal. No evidence of bowel wall thickening, distention, or inflammatory changes. Moderate diverticulosis without findings of acute diverticulitis. Vascular/Lymphatic: Aortic atherosclerosis. No enlarged abdominal or pelvic lymph nodes. 2.6 cm infrarenal abdominal aorta. Reproductive: Prostate is unremarkable. Other: No abdominal wall hernia or abnormality. No abdominopelvic ascites. Musculoskeletal: No fracture is seen.  Mild multilevel discogenic degenerative changes of the spine. IMPRESSION: 1. Right kidney lower pole punctate nonobstructing stone. No ureter stone or hydronephrosis identified. 2. Mild centrilobular emphysema of the lung bases. 3. Diverticulosis without findings of acute diverticulitis. 4. 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 Ectatic abdominal aorta at risk for aneurysm development. Recommend followup by ultrasound in 5 years. This recommendation follows ACR consensus guidelines: White Paper of the ACR Incidental Findings Committee II on Vascular Findings. J Am Coll Radiol 2013; 10:789-794. Electronically Signed   By: Kristine Garbe M.D.   On: 06/12/2017 04:31    Scheduled Meds: . aspirin EC  81 mg Oral QPM  . cholecalciferol  1,000 Units Oral Daily  . clopidogrel  75 mg Oral Daily  . cyclobenzaprine  10 mg Oral q morning - 10a  . enoxaparin (LOVENOX) injection  40 mg Subcutaneous Q24H  . [START ON 06/14/2017] magnesium gluconate  500 mg Oral Q M,W,F  . metoprolol succinate  25 mg Oral Daily  . naltrexone  50 mg Oral Daily  . rosuvastatin  40 mg Oral Daily    Continuous Infusions: . sodium chloride 100 mL/hr at 06/12/17 0600     LOS: 0 days     Desiree Hane, MD [facility] Pager [phone]  If 7PM-7AM, please contact night-coverage [email or web address] Password [facility] 06/12/2017, 8:13 AM

## 2017-06-12 NOTE — Discharge Summary (Signed)
Discharge Summary  Joseph Soto CZY:[id number] DOB: Feb 12, 1945  PCP: [name], MD  Admit date: 06/11/2017 Discharge date:    Time spent: < 25 minutes  Admitted From: Home Disposition: Home Recommendations for Outpatient Follow-up:  1. Follow up with PCP in 1-2 weeks. 2. Please obtain BMP (creatinine) in one week   Home Health: No Equipment/Devices: None  Discharge Diagnoses:  Active Hospital Problems   Diagnosis Date Noted  . Chest pain 06/12/2017  . ARF (acute renal failure) ([place]) 06/12/2017  . 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 Ischemic cardiomyopathy 08/21/2010  . CAD (coronary artery disease) 08/21/2010    Resolved Hospital Problems  No resolved problems to display.    Discharge Condition: Stable  CODE STATUS:Full  Diet recommendation: Heart Healthy   Vitals:   06/12/17 1116 06/12/17 1455  BP: 129/64 135/69  Pulse: 72 85  Resp: 19 13  Temp: 97.7 F (36.5 C) 97.6 F (36.4 C)  SpO2: 100% 97%    History of present illness:  Joseph Soto is a 72 y.o. year old male with medical history significant for CAD with CABG (2007) and DES to RCA (2018), non-Hodgkin's lymphoma, hyperlipidemia who presented on 06/11/2017 with right-sided shoulder and abdominal pain (he reports right shoulder pain consistent with prior MI)  and was admitted for observation for ACS rule out.  Remaining hospital course addressed in problem based format below:   Hospital Course:  Principal Problem:   Chest pain Active Problems:   CAD (coronary artery disease)   Ischemic cardiomyopathy   Leiomyosarcoma of chest wall ([facility])   ARF (acute renal failure) ([place])   1.  ACS rule out.  Significant CAD history with prior DES to RCA 2018 and CABG in 2007; however, troponin trend negative, EKG no acute ischemic changes.  Cardiology evaluated and agreed pain more consistent with MSK etiology given reproducibility and right lower quadrant of abdomen.  Additionally, patient  admitted to doing increase core exercises on his inversion table a few weeks prior to this presentation.  Further cardiac work-up deferred due to above.  2.  Right lower quadrant abdominal pain.  Suspect muscle strain related to increased abdominal core exercising as mentioned above above.  CT scan of abdomen is negative for obstructing renal stone or other acute on normalities.  LFTs and lipase were normal.  Encourage supportive care on discharge  3.  Elevated d-dimer.  Patient did report short episode of dyspnea while playing golf.  No signs of CHF on exam.  Chest x-ray showed no new abnormalities, patient does have chronic emphysema.  Unable to obtain CTA due to AKI.  V/Q scan showed low relative PE.  4.  AKI, unclear etiology.  Most consistent with prerenal etiology given improvement in 24 hours with IV fluids.  Peak creatinine of 1.86, 1.3 on discharge.  Will need repeat BMP on PCP follow-up (baseline creatinine 0.9-1).  Encouraged to continue oral hydration on discharge  4.  CAD status post CABG (2007) disease, DES to RCA O (2018).  Suspect cardiac etiology.  Patient discharged on continued home regimen of aspirin, metoprolol, Plavix and Crestor  Antibiotics: None  Microbiology: 9  Consultations:  V/Q scan, 5/4: Low probability ventilation/perfusion study   Procedures/Studies:   Dg Chest 2 View  Result Date: 06/11/2017 CLINICAL DATA:  Patient experiencing right side lower chest pains moving into right arm, and SOB. Symptoms started today after approximately 1 hour at the driving range. Symptoms had passed by time of CXR. Patient reports  a prior Myocardial inf.*comment was truncated* EXAM: CHEST - 2 VIEW COMPARISON:  11/07/2015 FINDINGS: Sternotomy wires overlie normal cardiac silhouette. Hyperinflated lungs. Normal pulmonary vasculature. No effusion, infiltrate, or pneumothorax. No acute osseous abnormality. IMPRESSION: Hyperinflated lungs.  No acute findings. Electronically Signed   By:  [name] M.D.   On: 06/11/2017 19:30   Nm Pulmonary Perf And Vent  Result Date: 06/12/2017 CLINICAL DATA:  72 year old male with a history of possible pulmonary embolism EXAM: NUCLEAR MEDICINE VENTILATION - PERFUSION LUNG SCAN TECHNIQUE: Ventilation images were obtained in multiple projections using inhaled aerosol Tc-17m DTPA. Perfusion images were obtained in multiple projections after intravenous injection of Tc-38m-MAA. RADIOPHARMACEUTICALS:  Thirty-one mCi of Tc-56m DTPA aerosol inhalation and 4.2 mCi Tc49m-MAA IV COMPARISON:  Chest x-ray 06/11/2017, CT abdomen 06/12/2017 FINDINGS: Multiple matched defects on ventilation and perfusion images on right and left lateral, as well as right and left anterior and posterior oblique. No corresponding opacities on the chest x-ray, which demonstrates emphysema. IMPRESSION: Low probability ventilation perfusion study. Electronically Signed   By: [name] D.O.   On: 06/12/2017 12:15   Ct Renal Stone Study  Result Date: 06/12/2017 CLINICAL DATA:  72 y/o  M; right flank pain. EXAM: CT ABDOMEN AND PELVIS WITHOUT CONTRAST TECHNIQUE: Multidetector CT imaging of the abdomen and pelvis was performed following the standard protocol without IV contrast. COMPARISON:  01/23/2013 CT abdomen and pelvis FINDINGS: Lower chest: Mild centrilobular emphysema of the lung bases. Hepatobiliary: No focal liver abnormality is seen. Status post cholecystectomy. No biliary dilatation. Pancreas: Unremarkable. No pancreatic ductal dilatation or surrounding inflammatory changes. Spleen: Normal in size without focal abnormality. Adrenals/Urinary Tract: Adrenal glands are unremarkable. Right kidney lower pole punctate nonobstructing stone. kidneys are otherwise normal, without additional renal calculi, focal lesion, or hydronephrosis. Bladder is unremarkable. Stomach/Bowel: Stomach is within normal limits. Appendix appears normal. No evidence of bowel wall thickening, distention, or  inflammatory changes. Moderate diverticulosis without findings of acute diverticulitis. Vascular/Lymphatic: Aortic atherosclerosis. No enlarged abdominal or pelvic lymph nodes. 2.6 cm infrarenal abdominal aorta. Reproductive: Prostate is unremarkable. Other: No abdominal wall hernia or abnormality. No abdominopelvic ascites. Musculoskeletal: No fracture is seen. Mild multilevel discogenic degenerative changes of the spine. IMPRESSION: 1. Right kidney lower pole punctate nonobstructing stone. No ureter stone or hydronephrosis identified. 2. Mild centrilobular emphysema of the lung bases. 3. Diverticulosis without findings of acute diverticulitis. 4. 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 Ectatic abdominal aorta at risk for aneurysm development. Recommend followup by ultrasound in 5 years. This recommendation follows ACR consensus guidelines: White Paper of the ACR Incidental Findings Committee II on Vascular Findings. J Am Coll Radiol 2013; 10:789-794. Electronically Signed   By: Kristine Garbe M.D.   On: 06/12/2017 04:31      Discharge Exam: BP 135/69 (BP Location: Left Arm)   Pulse 85   Temp 97.6 F (36.4 C) (Oral)   Resp 13   Ht 6' (1.829 m)   Wt 85.6 kg (188 lb 11.2 oz)   SpO2 97%   BMI 25.59 kg/m   General: Lying in bed, no apparent distress Eyes: EOMI, anicteric ENT: Oral Mucosa clear and moist Cardiovascular: regular rate and rhythm, no murmurs, rubs or gallops, Peripheral Pulses Present, no edema, no JVD, no chest wall tenderness Respiratory: Normal respiratory effort on room air, lungs clear to auscultation bilaterally Abdomen: soft, non-distended, slightly tender in right lower quadrant with palpation, negative Murphy signs, no rebound tenderness or guarding, normal bowel sounds Skin: No Rash  Musculoskeletal:Good ROM, no contractures. Normal muscle tone Neurologic: Grossly no focal neuro deficit.Mental status AAOx3, speech  normal, Psychiatric:Appropriate affect, and mood   Discharge Instructions You were cared for by a hospitalist during your hospital stay. If you have any questions about your discharge medications or the care you received while you were in the hospital after you are discharged, you can call the unit and asked to speak with the hospitalist on call if the hospitalist that took care of you is not available. Once you are discharged, your primary care physician will handle any further medical issues. Please note that NO REFILLS for any discharge medications will be authorized once you are discharged, as it is imperative that you return to your primary care physician (or establish a relationship with a primary care physician if you do not have one) for your aftercare needs so that they can reassess your need for medications and monitor your lab values.  Discharge Instructions    Diet - low sodium heart healthy   Complete by:  As directed    Increase activity slowly   Complete by:  As directed      Allergies as of 06/12/2017      Reactions   Niacin And Related Nausea And Vomiting   Niaspan [niacin Er] Nausea And Vomiting, Other (See Comments)   Stomach intolerance      Medication List    TAKE these medications   aspirin EC 81 MG tablet Take 81 mg by mouth every evening.   cholecalciferol 1000 units tablet Commonly known as:  VITAMIN D Take 1,000 Units by mouth daily.   Coenzyme Q10 300 MG Caps Take 300 mg by mouth daily.   cyclobenzaprine 10 MG tablet Commonly known as:  FLEXERIL Take 10 mg by mouth every morning.   L-Arginine 1000 MG Tabs Take 1,000 mg by mouth 2 (two) times daily.   Magnesium Gluconate 550 MG Tabs Take 550 mg by mouth every Monday, Wednesday, and Friday.   metoprolol succinate 25 MG 24 hr tablet Commonly known as:  TOPROL-XL TAKE 1 TABLET BY MOUTH DAILY.   naltrexone 50 MG tablet Commonly known as:  DEPADE Take 50 mg by mouth daily.   nitroGLYCERIN 0.4 MG  SL tablet Commonly known as:  NITROSTAT Place 1 tablet (0.4 mg total) under the tongue every 5 (five) minutes as needed for chest pain.   PLAVIX 75 MG tablet Generic drug:  clopidogrel Take 75 mg by mouth daily.   Probiotic Caps Take 1 capsule by mouth 2 (two) times daily.   rosuvastatin 40 MG tablet Commonly known as:  CRESTOR TAKE 1 TABLET BY MOUTH EVERY DAY What changed:    how much to take  how to take this  when to take this   triamcinolone 55 MCG/ACT Aero nasal inhaler Commonly known as:  NASACORT Place 1 spray into the nose daily as needed (for allergies).      Allergies  Allergen Reactions  . Niacin And Related Nausea And Vomiting  . Niaspan [Niacin Er] Nausea And Vomiting and Other (See Comments)    Stomach intolerance      The results of significant diagnostics from this hospitalization (including imaging, microbiology, ancillary and laboratory) are listed below for reference.    Significant Diagnostic Studies: Dg Chest 2 View  Result Date: 06/11/2017 CLINICAL DATA:  Patient experiencing right side lower chest pains moving into right arm, and SOB. Symptoms started today after approximately 1 hour at the driving range. Symptoms had passed by  time of CXR. Patient reports a prior Myocardial inf.*comment was truncated* EXAM: CHEST - 2 VIEW COMPARISON:  11/07/2015 FINDINGS: Sternotomy wires overlie normal cardiac silhouette. Hyperinflated lungs. Normal pulmonary vasculature. No effusion, infiltrate, or pneumothorax. No acute osseous abnormality. IMPRESSION: Hyperinflated lungs.  No acute findings. Electronically Signed   By: [name] M.D.   On: 06/11/2017 19:30   Nm Pulmonary Perf And Vent  Result Date: 06/12/2017 CLINICAL DATA:  72 year old male with a history of possible pulmonary embolism EXAM: NUCLEAR MEDICINE VENTILATION - PERFUSION LUNG SCAN TECHNIQUE: Ventilation images were obtained in multiple projections using inhaled aerosol Tc-59m DTPA. Perfusion  images were obtained in multiple projections after intravenous injection of Tc-14m-MAA. RADIOPHARMACEUTICALS:  Thirty-one mCi of Tc-66m DTPA aerosol inhalation and 4.2 mCi Tc67m-MAA IV COMPARISON:  Chest x-ray 06/11/2017, CT abdomen 06/12/2017 FINDINGS: Multiple matched defects on ventilation and perfusion images on right and left lateral, as well as right and left anterior and posterior oblique. No corresponding opacities on the chest x-ray, which demonstrates emphysema. IMPRESSION: Low probability ventilation perfusion study. Electronically Signed   By: [name] D.O.   On: 06/12/2017 12:15   Ct Renal Stone Study  Result Date: 06/12/2017 CLINICAL DATA:  72 y/o  M; right flank pain. EXAM: CT ABDOMEN AND PELVIS WITHOUT CONTRAST TECHNIQUE: Multidetector CT imaging of the abdomen and pelvis was performed following the standard protocol without IV contrast. COMPARISON:  01/23/2013 CT abdomen and pelvis FINDINGS: Lower chest: Mild centrilobular emphysema of the lung bases. Hepatobiliary: No focal liver abnormality is seen. Status post cholecystectomy. No biliary dilatation. Pancreas: Unremarkable. No pancreatic ductal dilatation or surrounding inflammatory changes. Spleen: Normal in size without focal abnormality. Adrenals/Urinary Tract: Adrenal glands are unremarkable. Right kidney lower pole punctate nonobstructing stone. kidneys are otherwise normal, without additional renal calculi, focal lesion, or hydronephrosis. Bladder is unremarkable. Stomach/Bowel: Stomach is within normal limits. Appendix appears normal. No evidence of bowel wall thickening, distention, or inflammatory changes. Moderate diverticulosis without findings of acute diverticulitis. Vascular/Lymphatic: Aortic atherosclerosis. No enlarged abdominal or pelvic lymph nodes. 2.6 cm infrarenal abdominal aorta. Reproductive: Prostate is unremarkable. Other: No abdominal wall hernia or abnormality. No abdominopelvic ascites. Musculoskeletal: No  fracture is seen. Mild multilevel discogenic degenerative changes of the spine. IMPRESSION: 1. Right kidney lower pole punctate nonobstructing stone. No ureter stone or hydronephrosis identified. 2. Mild centrilobular emphysema of the lung bases. 3. Diverticulosis without findings of acute diverticulitis. 4. 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 Ectatic abdominal aorta at risk for aneurysm development. Recommend followup by ultrasound in 5 years. This recommendation follows ACR consensus guidelines: White Paper of the ACR Incidental Findings Committee II on Vascular Findings. J Am Coll Radiol 2013; 10:789-794. Electronically Signed   By: Kristine Garbe M.D.   On: 06/12/2017 04:31    Microbiology: Recent Results (from the past 240 hour(s))  MRSA PCR Screening     Status: None   Collection Time: 06/12/17  5:32 AM  Result Value Ref Range Status   MRSA by PCR NEGATIVE NEGATIVE Final    Comment:        The GeneXpert MRSA Assay (FDA approved for NASAL specimens only), is one component of a comprehensive MRSA colonization surveillance program. It is not intended to diagnose MRSA infection nor to guide or monitor treatment for MRSA infections. Performed at [facility], [place] [address], [place], [place] [address]      Labs: Basic Metabolic Panel: Recent Labs  Lab 06/11/17 1822 06/12/17 0409  NA 141  --  K 4.5  --   CL 109  --   CO2 22  --   GLUCOSE 117*  --   BUN 32*  --   CREATININE 1.86* 1.38*  CALCIUM 9.5  --    Liver Function Tests: Recent Labs  Lab 06/12/17 0409  AST 23  ALT 16*  ALKPHOS 45  BILITOT 0.7  PROT 6.3*  ALBUMIN 3.6   Recent Labs  Lab 06/12/17 0409  LIPASE 34   No results for input(s): AMMONIA in the last 168 hours. CBC: Recent Labs  Lab 06/11/17 1822 06/12/17 0409  WBC 10.2 10.8*  HGB 13.6 13.0  HCT 40.7 39.2  MCV 88.5 88.3  PLT 307 243   Cardiac Enzymes: Recent Labs  Lab 06/12/17 0409  06/12/17 0925  TROPONINI <0.03 <0.03   BNP: BNP (last 3 results) No results for input(s): BNP in the last 8760 hours.  ProBNP (last 3 results) No results for input(s): PROBNP in the last 8760 hours.  CBG: No results for input(s): GLUCAP in the last 168 hours.     Signed:  Desiree Hane, MD [facility] 06/12/2017, 4:01 PM

## 2017-06-12 NOTE — Progress Notes (Signed)
Discharge instructions reviewed with patient and family, IV was removed and personal belongings packed. Patient was reminded to schedule follow up appointments with the cardiologist and primary care provider. Patient insisted on ambulating to vehicle.

## 2017-06-12 NOTE — ED Provider Notes (Signed)
TIME SEEN: 1:01 AM  CHIEF COMPLAINT: Chest pain, shortness of breath  HPI: Patient is a 72 year old male with history of hypertension, hyperlipidemia, CAD status post stent and MI who presents to the emergency department with right-sided chest pain that radiated into the right arm that he described as feeling like "my rib cage was collapsing" with associated shortness of breath while at the driving range night.  Took full dose aspirin and nitroglycerin and now is pain-free.  States this feels somewhat similar to his previous MI where he also had right-sided symptoms.  No chest pain currently.  No nausea, diaphoresis or dizziness.  Also noted to have elevated kidney function.  States he does not eat and drink as well as he should.  No vomiting or diarrhea.  ROS: See HPI Constitutional: no fever  Eyes: no drainage  ENT: no runny nose   Cardiovascular:   chest pain  Resp:  SOB  GI: no vomiting GU: no dysuria Integumentary: no rash  Allergy: no hives  Musculoskeletal: no leg swelling  Neurological: no slurred speech ROS otherwise negative  PAST MEDICAL HISTORY/PAST SURGICAL HISTORY:  Past Medical History:  Diagnosis Date  . Buttock pain   . CAD (coronary artery disease)    Last cath 2010 per Dr. Doreatha Lew with diffuse multivessel disease, small caliber vessels not felt to be suitable for PCI  . Chronic left hip pain   . Dizziness    1 episode   . Fibromyalgia   . GERD (gastroesophageal reflux disease)   . Head pain    "not chronic; I have myofasial tightening in my head that causes pain in my skull" (06/17/2016)  . HLD (hyperlipidemia)   . Hypertension   . Myeloma ([place])    "found it in my chest when they did the excision"  . Myocardial infarction ([place]) 06/2005  . Non Hodgkin's lymphoma ([place]) dx'd 06/2005    MEDICATIONS:  Prior to Admission medications   Medication Sig Start Date End Date Taking? Authorizing Provider  aspirin EC 81 MG tablet Take 81 mg by mouth every evening.    Yes  [provider]  cholecalciferol (VITAMIN D) 1000 units tablet Take 1,000 Units by mouth daily.   Yes [provider]  clopidogrel (PLAVIX) 75 MG tablet Take 75 mg by mouth daily.  03/24/10  Yes [provider]  Coenzyme Q10 300 MG CAPS Take 300 mg by mouth daily.   Yes [provider]  cyclobenzaprine (FLEXERIL) 10 MG tablet Take 10 mg by mouth every morning.    Yes [provider]  L-Arginine 1000 MG TABS Take 1,000 mg by mouth 2 (two) times daily.   Yes [provider]  Magnesium Gluconate 550 MG TABS Take 550 mg by mouth every Monday, Wednesday, and Friday.   Yes [provider]  metoprolol succinate (TOPROL-XL) 25 MG 24 hr tablet TAKE 1 TABLET BY MOUTH DAILY. 03/19/17  Yes Burnell Blanks, MD  naltrexone (DEPADE) 50 MG tablet Take 50 mg by mouth daily.  01/17/17  Yes [provider]  nitroGLYCERIN (NITROSTAT) 0.4 MG SL tablet Place 1 tablet (0.4 mg total) under the tongue every 5 (five) minutes as needed for chest pain. 06/18/16  Yes Reino Bellis B, NP  Probiotic CAPS Take 1 capsule by mouth 2 (two) times daily.   Yes [provider]  rosuvastatin (CRESTOR) 40 MG tablet TAKE 1 TABLET BY MOUTH EVERY DAY Patient taking differently: Take 40 mg by mouth at bedtime 10/08/16  Yes McAlhany,  Annita Brod, MD  triamcinolone (NASACORT) 55 MCG/ACT AERO nasal inhaler Place 1 spray into the nose daily as needed (for allergies).    Yes [provider]    ALLERGIES:  Allergies  Allergen Reactions  . Niacin And Related Nausea And Vomiting  . Niaspan [Niacin Er] Nausea And Vomiting and Other (See Comments)    Stomach intolerance    SOCIAL HISTORY:  Social History   Tobacco Use  . Smoking status: Former Smoker    Packs/day: 1.50    Years: 20.00    Pack years: 30.00    Types: Cigarettes  . Smokeless tobacco: Never Used  . Tobacco comment: "quit 06/2005"  Substance Use Topics  . Alcohol use: No     Alcohol/week: 0.0 oz    FAMILY HISTORY: Family History  Problem Relation Age of Onset  . Heart disease Father   . Heart attack Father   . Dementia Mother     EXAM: BP (!) 157/78   Pulse 67   Temp (!) 97.4 F (36.3 C) (Oral)   Resp 17   Ht 6' (1.829 m)   Wt 86.2 kg (190 lb)   SpO2 100%   BMI 25.77 kg/m  CONSTITUTIONAL: Alert and oriented and responds appropriately to questions. Well-appearing; well-nourished, elderly HEAD: Normocephalic EYES: Conjunctivae clear, pupils appear equal, EOMI ENT: normal nose; moist mucous membranes NECK: Supple, no meningismus, no nuchal rigidity, no LAD  CARD: RRR; S1 and S2 appreciated; no murmurs, no clicks, no rubs, no gallops RESP: Normal chest excursion without splinting or tachypnea; breath sounds clear and equal bilaterally; no wheezes, no rhonchi, no rales, no hypoxia or respiratory distress, speaking full sentences ABD/GI: Normal bowel sounds; non-distended; soft, non-tender, no rebound, no guarding, no peritoneal signs, no hepatosplenomegaly BACK:  The back appears normal and is non-tender to palpation, there is no CVA tenderness EXT: Normal ROM in all joints; non-tender to palpation; no edema; normal capillary refill; no cyanosis, no calf tenderness or swelling    SKIN: Normal color for age and race; warm; no rash NEURO: Moves all extremities equally PSYCH: The patient's mood and manner are appropriate. Grooming and personal hygiene are appropriate.  MEDICAL DECISION MAKING: Patient here with chest pain.  He states that this feels similar to his previous MI and has significant coronary artery history with a history of a stent.  Troponin today is negative.  EKG shows no new ischemic change.  Chest x-ray clear.  Pain-free currently.  Doubt PE or dissection.  Recommended admission to the hospital for chest pain rule out.  Also found to have GFR of 34 and creatinine of 1.86.  Normally has creatinine is normal with a GFR greater than 60.   Suspect this is from dehydration.  We will start IV fluids.  ED PROGRESS:   1:44 AM Discussed patient's case with hospitalist, Dr. Hal Hope.  I have recommended admission and patient (and family if present) agree with this plan. Admitting physician will place admission orders.   I reviewed all nursing notes, vitals, pertinent previous records, EKGs, lab and urine results, imaging (as available).     EKG Interpretation  Date/Time:  Friday Jun 11 2017 18:08:09 EDT Ventricular Rate:  92 PR Interval:  136 QRS Duration: 84 QT Interval:  348 QTC Calculation: 430 R Axis:   65 Text Interpretation:  Sinus rhythm with occasional Premature ventricular complexes Otherwise normal ECG No significant change since last tracing Confirmed by Javell Blackburn, Cyril Mourning [id number]) on 06/12/2017 1:00:56 AM  Kahdijah Errickson, Delice Bison, DO 06/12/17 0201

## 2017-06-12 NOTE — Discharge Instructions (Signed)

## 2017-06-16 DIAGNOSIS — I2510 Atherosclerotic heart disease of native coronary artery without angina pectoris: Secondary | ICD-10-CM | POA: Diagnosis not present

## 2017-06-16 DIAGNOSIS — R1011 Right upper quadrant pain: Secondary | ICD-10-CM | POA: Diagnosis not present

## 2017-06-16 DIAGNOSIS — N179 Acute kidney failure, unspecified: Secondary | ICD-10-CM | POA: Diagnosis not present

## 2017-06-16 DIAGNOSIS — I10 Essential (primary) hypertension: Secondary | ICD-10-CM | POA: Diagnosis not present

## 2017-07-02 DIAGNOSIS — R05 Cough: Secondary | ICD-10-CM | POA: Diagnosis not present

## 2017-07-02 DIAGNOSIS — J029 Acute pharyngitis, unspecified: Secondary | ICD-10-CM | POA: Diagnosis not present

## 2017-07-20 ENCOUNTER — Other Ambulatory Visit: Payer: Self-pay | Admitting: Cardiology

## 2017-08-02 ENCOUNTER — Other Ambulatory Visit: Payer: Self-pay | Admitting: Cardiovascular Disease

## 2017-08-19 ENCOUNTER — Encounter: Payer: Self-pay | Admitting: Cardiovascular Disease

## 2017-09-30 DIAGNOSIS — Z125 Encounter for screening for malignant neoplasm of prostate: Secondary | ICD-10-CM | POA: Diagnosis not present

## 2017-09-30 DIAGNOSIS — I10 Essential (primary) hypertension: Secondary | ICD-10-CM | POA: Diagnosis not present

## 2017-09-30 DIAGNOSIS — R82998 Other abnormal findings in urine: Secondary | ICD-10-CM | POA: Diagnosis not present

## 2017-09-30 DIAGNOSIS — E7849 Other hyperlipidemia: Secondary | ICD-10-CM | POA: Diagnosis not present

## 2017-10-04 DIAGNOSIS — I10 Essential (primary) hypertension: Secondary | ICD-10-CM | POA: Diagnosis not present

## 2017-10-04 DIAGNOSIS — C8590 Non-Hodgkin lymphoma, unspecified, unspecified site: Secondary | ICD-10-CM | POA: Diagnosis not present

## 2017-10-04 DIAGNOSIS — I2510 Atherosclerotic heart disease of native coronary artery without angina pectoris: Secondary | ICD-10-CM | POA: Diagnosis not present

## 2017-10-04 DIAGNOSIS — J439 Emphysema, unspecified: Secondary | ICD-10-CM | POA: Diagnosis not present

## 2017-10-04 DIAGNOSIS — C499 Malignant neoplasm of connective and soft tissue, unspecified: Secondary | ICD-10-CM | POA: Diagnosis not present

## 2017-10-04 DIAGNOSIS — Z0000 Encounter for general adult medical examination without abnormal findings: Secondary | ICD-10-CM | POA: Diagnosis not present

## 2017-10-04 DIAGNOSIS — L988 Other specified disorders of the skin and subcutaneous tissue: Secondary | ICD-10-CM | POA: Diagnosis not present

## 2017-10-04 DIAGNOSIS — E7849 Other hyperlipidemia: Secondary | ICD-10-CM | POA: Diagnosis not present

## 2017-10-04 DIAGNOSIS — J00 Acute nasopharyngitis [common cold]: Secondary | ICD-10-CM | POA: Diagnosis not present

## 2017-10-04 DIAGNOSIS — N183 Chronic kidney disease, stage 3 (moderate): Secondary | ICD-10-CM | POA: Diagnosis not present

## 2017-10-04 NOTE — Telephone Encounter (Signed)
I spoke with pt. He reports worsening shortness of breath. Occurs when playing golf and other activities.  No chest pain. I scheduled pt to see B. Rosita Fire, [place] tomorrow at 9:00

## 2017-10-04 NOTE — Telephone Encounter (Signed)
Left a message to call back.

## 2017-10-04 NOTE — Telephone Encounter (Signed)
Spoke with patient, he has been having SOB since May 2018 around the time of his stent placement. His shortness of breath has gotten worse over the months. He currently if ambulating will have SOB and goes away with relaxation. The patient exercises but has had to reduce his movement. Positional changes cause his SOB to be worse. Advised patient if symptoms get worse to call the office.     Sending to Dr. Angelena Form for recommendations.

## 2017-10-05 ENCOUNTER — Encounter: Payer: Self-pay | Admitting: Cardiology

## 2017-10-05 ENCOUNTER — Ambulatory Visit: Payer: Medicare HMO | Admitting: Cardiology

## 2017-10-05 ENCOUNTER — Encounter: Payer: Self-pay | Admitting: *Deleted

## 2017-10-05 VITALS — BP 116/76 | HR 84 | Ht 71.0 in | Wt 202.8 lb

## 2017-10-05 DIAGNOSIS — I2510 Atherosclerotic heart disease of native coronary artery without angina pectoris: Secondary | ICD-10-CM

## 2017-10-05 DIAGNOSIS — R0600 Dyspnea, unspecified: Secondary | ICD-10-CM

## 2017-10-05 DIAGNOSIS — I493 Ventricular premature depolarization: Secondary | ICD-10-CM | POA: Diagnosis not present

## 2017-10-05 NOTE — Patient Instructions (Addendum)
Medication Instructions:   Your physician recommends that you continue on your current medications as directed. Please refer to the Current Medication list given to you today.   If you need a refill on your cardiac medications before your next appointment, please call your pharmacy.  Labwork: NONE ORDERED  TODAY    Testing/Procedures: Your physician has requested that you have en exercise stress myoview. For further information please visit [email or web address]. Please follow instruction sheet, as given.  Your physician has recommended that you wear a holter monitor. Holter monitors are medical devices that record the heart's electrical activity. Doctors most often use these monitors to diagnose arrhythmias. Arrhythmias are problems with the speed or rhythm of the heartbeat. The monitor is a small, portable device. You can wear one while you do your normal daily activities. This is usually used to diagnose what is causing palpitations/syncope (passing out).       Follow-Up:  IN 2 Weeks WITH SIMMONS  ON A DAY MCAL[facility]ANY IN OFFICE     Any Other Special Instructions Will Be Listed Below (If Applicable).

## 2017-10-05 NOTE — Progress Notes (Signed)
10/05/2017 Joseph Soto   [birth year]  [id number]  Primary Physician [name], MD Primary Cardiologist: Dr. Angelena Form   Reason for Visit/CC: Exertional Dyspnea, Known CAD  HPI:  Joseph Soto is a 72 y.o. male who is being seen today for the evaluation of exertional dyspnea. He is followed by Dr. Angelena Form with a history of CAD s/p CABG 2007, Non-hodgkins Lymphoma and HLD.  He had 4V CABG in 2007. Cath in 2010 showed occluded SVG to Diagonal and occluded SVG to PDA/PLA withpatent LIMA graft to the LAD. There was diffuse disease in distal vessels felt best to be managed medically. 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 Echo in April 2018 showed LVEF=40-45% and hypokinesis of the anteroseptal wall and apex. Nuclear stress test April 2018 with possible ischemia. Cardiac cath 06/17/16 with patent LIMA to LAD, all other grafts known to be occluded. There was severe stenosis in the proximal RCA treated with a drug eluting stent.   Most recently, he contacted Dr. Angelena Form by patient e-mail through [place], asking for clearance to take Meloxicam for musculoskeletal/ arthritic pain 3-4 days a week, which was approved. Pt also given the ok to stop low dose ASA. He was continued on daily Plavix.   Patient is here today given exertional dyspnea.  He reports that symptoms have been present for several months now.  He denies any associated chest pain and no right arm pain which was present prior to undergoing bypass surgery in 2007.  He exercises routinely with a physical trainer.  He notes exertional dyspnea walking on a treadmill however it does not increase with change of intensity or speed.  Dyspnea remains stable.  He is also short of breath lifting weights and playing golf.  The dyspnea is present but does not limit him in regards to the amount of activity he is able to do.  He reports that he can walk on the treadmill briskly greater than 30 minutes without  having to stop.  Denies associated palpitations.  No lightheadedness dizziness, syncope/near syncope.  He also denies lower extremity edema, orthopnea and PND.    EKG today shows NSR. HR 84 bpm. No ischemic abnormalities. BP is well controlled at 116/76.  Note, I reviewed his stress test that was performed in 2018.  During stress test he was noted to have frequent PACs and PVCs.  Again, he denies any history of palpitations.   Current Meds  Medication Sig  . cholecalciferol (VITAMIN D) 1000 units tablet Take 1,000 Units by mouth daily.  . clopidogrel (PLAVIX) 75 MG tablet TAKE 1 TABLET (75 MG TOTAL) BY MOUTH DAILY WITH BREAKFAST.  Marland Kitchen Coenzyme Q10 300 MG CAPS Take 300 mg by mouth daily.  . cyclobenzaprine (FLEXERIL) 10 MG tablet Take 10 mg by mouth 2 (two) times daily.  Marland Kitchen L-Arginine 1000 MG TABS Take 1,000 mg by mouth 2 (two) times daily.  . Magnesium Gluconate 550 MG TABS Take 550 mg by mouth every Monday, Wednesday, and Friday.  . meloxicam (MOBIC) 15 MG tablet Take 15 mg by mouth every other day.  . metoprolol succinate (TOPROL-XL) 25 MG 24 hr tablet Take 12.5 mg by mouth 2 (two) times daily.  . naltrexone (DEPADE) 50 MG tablet Take 50 mg by mouth daily.   . nitroGLYCERIN (NITROSTAT) 0.4 MG SL tablet Place 1 tablet (0.4 mg total) under the tongue every 5 (five) minutes as needed for chest pain.  . Probiotic CAPS Take 1 capsule  by mouth 2 (two) times daily.  . rosuvastatin (CRESTOR) 40 MG tablet TAKE 1 TABLET BY MOUTH EVERY DAY  . triamcinolone (NASACORT) 55 MCG/ACT AERO nasal inhaler Place 1 spray into the nose daily as needed (for allergies).    Allergies  Allergen Reactions  . Niacin And Related Nausea And Vomiting  . Niaspan [Niacin Er] Nausea And Vomiting and Other (See Comments)    Stomach intolerance   Past Medical History:  Diagnosis Date  . Buttock pain   . CAD (coronary artery disease)    Last cath 2010 per Dr. Doreatha Lew with diffuse multivessel disease, small caliber vessels  not felt to be suitable for PCI  . Chronic left hip pain   . Dizziness    1 episode   . Fibromyalgia   . GERD (gastroesophageal reflux disease)   . Head pain    "not chronic; I have myofasial tightening in my head that causes pain in my skull" (06/17/2016)  . HLD (hyperlipidemia)   . Hypertension   . Myeloma ([place])    "found it in my chest when they did the excision"  . Myocardial infarction ([place]) 06/2005  . Non Hodgkin's lymphoma ([place]) dx'd 06/2005   Family History  Problem Relation Age of Onset  . Heart disease Father   . Heart attack Father   . Dementia Mother    Past Surgical History:  Procedure Laterality Date  . CARDIAC CATHETERIZATION  ~ 2008   "Dr. Doreatha Lew"  . CATARACT EXTRACTION W/ INTRAOCULAR LENS IMPLANT Right   . CORONARY ANGIOPLASTY WITH STENT PLACEMENT  06/17/2016  . CORONARY ARTERY BYPASS GRAFT  5/07   x5. LV dysfunction.   . CORONARY STENT INTERVENTION N/A 06/17/2016   Procedure: Coronary Stent Intervention;  Surgeon: Burnell Blanks, MD;  Location: [facility];  Service: Cardiovascular;  Laterality: N/A;  . LAPAROSCOPIC CHOLECYSTECTOMY    . LEFT HEART CATH AND CORS/GRAFTS ANGIOGRAPHY N/A 06/17/2016   Procedure: Left Heart Cath and Cors/Grafts Angiography;  Surgeon: Burnell Blanks, MD;  Location: [facility];  Service: Cardiovascular;  Laterality: N/A;  . MASS EXCISION N/A 11/11/2015   Procedure: EXCISION OF MID CHEST  MASS;  Surgeon: Judeth Horn, MD;  Location: [facility];  Service: General;  Laterality: N/A;  . MASS EXCISION N/A 12/23/2015   Procedure: REEXCISION OF CHEST WALL TUMOR SITE;  Surgeon: Judeth Horn, MD;  Location: [facility];  Service: General;  Laterality: N/A;  . POPLITEAL SYNOVIAL CYST EXCISION Right   . TONSILLECTOMY     Social History   Socioeconomic History  . Marital status: Married    Spouse name: [name]  . Number of children: 2  . Years of education: college  . Highest education  level: Not on file  Occupational History  . Occupation: Camera operator  Social Needs  . Financial resource strain: Not on file  . Food insecurity:    Worry: Not on file    Inability: Not on file  . Transportation needs:    Medical: Not on file    Non-medical: Not on file  Tobacco Use  . Smoking status: Former Smoker    Packs/day: 1.50    Years: 20.00    Pack years: 30.00    Types: Cigarettes  . Smokeless tobacco: Never Used  . Tobacco comment: "quit 06/2005"  Substance and Sexual Activity  . Alcohol use: No    Alcohol/week: 0.0 standard drinks  . Drug use: No  . Sexual activity: Not  on file  Lifestyle  . Physical activity:    Days per week: Not on file    Minutes per session: Not on file  . Stress: Not on file  Relationships  . Social connections:    Talks on phone: Not on file    Gets together: Not on file    Attends religious service: Not on file    Active member of club or organization: Not on file    Attends meetings of clubs or organizations: Not on file    Relationship status: Not on file  . Intimate partner violence:    Fear of current or ex partner: Not on file    Emotionally abused: Not on file    Physically abused: Not on file    Forced sexual activity: Not on file  Other Topics Concern  . Not on file  Social History Narrative   Married, 2 children.   Runs an executive recruiting company.    Left-handed.   Drinks a bottle of green tea.     Review of Systems: General: negative for chills, fever, night sweats or weight changes.  Cardiovascular: negative for chest pain, dyspnea on exertion, edema, orthopnea, palpitations, paroxysmal nocturnal dyspnea or shortness of breath Dermatological: negative for rash Respiratory: negative for cough or wheezing Urologic: negative for hematuria Abdominal: negative for nausea, vomiting, diarrhea, bright red blood per rectum, melena, or hematemesis Neurologic: negative for visual changes, syncope, or  dizziness All other systems reviewed and are otherwise negative except as noted above.   Physical Exam:  Blood pressure 116/76, pulse 84, height 5\' 11"  (1.803 m), weight 202 lb 12.8 oz (92 kg).  General appearance: alert, cooperative and no distress Neck: no carotid bruit and no JVD Lungs: clear to auscultation bilaterally Heart: regular rate and rhythm, S1, S2 normal, no murmur, click, rub or gallop Extremities: extremities normal, atraumatic, no cyanosis or edema Pulses: 2+ and symmetric Skin: Skin color, texture, turgor normal. No rashes or lesions Neurologic: Grossly normal  EKG NSR no ischemic abnormalities  -- personally reviewed   ASSESSMENT AND PLAN:   This is a 72 year old male with known history of coronary artery disease status post four-vessel CABG in 2007 with last cardiac catheterization in 2018 showing patent LIMA to LAD, all other grafts known to be occluded. There was severe stenosis in the proximal RCA treated with a drug eluting stent.  He is physically active, exercising with an athletic trainer several days a week and has noticed over the course of several months dyspnea with physical activity, however interestingly this dyspnea does not prevent him from performing the level of activity he desires.  The dyspnea remains constant during activity.  He is able to ambulate on a treadmill at a steady brisk pace for up to 30 minutes without any worsening dyspnea or need to stop.  He also denies any associated chest pressure, tightness or heaviness.  Also he denies any right arm pain which was present prior to undergoing bypass surgery in 2007.  No signs of volume overload on exam and he denies any symptoms of orthopnea or PND.  During the time of his last stress test a year ago, it was noted that he was having frequent PACs and PVCs.  His EKG in clinic today shows normal sinus rhythm without any arrhythmias however he is currently asymptomatic without any dyspnea at present.  Given  his cardiac history and fact that he only had 1 out of 4 patent grafts at time of last cath,  I have recommended repeat exercise Myoview to assess for ischemia in the LAD territory (LIMA-LAD was patent on last cath in 2018).  We will also get an EF assessment at time of nuclear stress test.  Given the fact that he had frequent PACs and PVCs on last stress test, we will also order a 48-hour Holter monitor to assess for any arrhythmias that may explain his symptoms. F/u in 1-2 weeks.    Follow-Up in 1-2 weeks after monitor and stress test.   Joseph Soto, MHS [facility] 10/05/2017 12:19 PM

## 2017-10-07 ENCOUNTER — Telehealth (HOSPITAL_COMMUNITY): Payer: Self-pay | Admitting: *Deleted

## 2017-10-07 NOTE — Telephone Encounter (Signed)
.  Patient given detailed instructions per Myocardial Perfusion Study Information Sheet for the test on 10/12/17 Patient notified to arrive 15 minutes early and that it is imperative to arrive on time for appointment to keep from having the test rescheduled.  If you need to cancel or reschedule your appointment, please call the office within 24 hours of your appointment. . Patient verbalized understanding. Kirstie Peri

## 2017-10-08 DIAGNOSIS — Z1212 Encounter for screening for malignant neoplasm of rectum: Secondary | ICD-10-CM | POA: Diagnosis not present

## 2017-10-12 ENCOUNTER — Ambulatory Visit (INDEPENDENT_AMBULATORY_CARE_PROVIDER_SITE_OTHER): Payer: Medicare HMO

## 2017-10-12 ENCOUNTER — Ambulatory Visit (HOSPITAL_COMMUNITY): Payer: Medicare HMO | Attending: Cardiovascular Disease

## 2017-10-12 DIAGNOSIS — I493 Ventricular premature depolarization: Secondary | ICD-10-CM | POA: Diagnosis not present

## 2017-10-12 DIAGNOSIS — R0600 Dyspnea, unspecified: Secondary | ICD-10-CM | POA: Diagnosis not present

## 2017-10-12 MED ORDER — TECHNETIUM TC 99M TETROFOSMIN IV KIT
7.9000 | PACK | Freq: Once | INTRAVENOUS | Status: AC | PRN
  Administered 2017-10-12: 7.9 via INTRAVENOUS
  Filled 2017-10-12: qty 8

## 2017-10-12 MED ORDER — TECHNETIUM TC 99M TETROFOSMIN IV KIT
31.3000 | PACK | Freq: Once | INTRAVENOUS | Status: AC | PRN
  Administered 2017-10-12: 31.3 via INTRAVENOUS
  Filled 2017-10-12: qty 32

## 2017-10-13 DIAGNOSIS — Z7689 Persons encountering health services in other specified circumstances: Secondary | ICD-10-CM | POA: Diagnosis not present

## 2017-10-13 LAB — MYOCARDIAL PERFUSION IMAGING
Estimated workload: 8.9 METS
Exercise duration (min): 7 min
Exercise duration (sec): 15 s
LV dias vol: 73 mL (ref 62–150)
LV sys vol: 42 mL
MPHR: 147 {beats}/min
Peak HR: 127 {beats}/min
Percent HR: 86 %
RPE: 18
Rest BP: 202 mmHg
Rest HR: 71 {beats}/min
SDS: 1
SRS: 2
SSS: 3
TID: 0.96

## 2017-10-14 DIAGNOSIS — D831 Common variable immunodeficiency with predominant immunoregulatory T-cell disorders: Secondary | ICD-10-CM | POA: Diagnosis not present

## 2017-10-14 DIAGNOSIS — D8989 Other specified disorders involving the immune mechanism, not elsewhere classified: Secondary | ICD-10-CM | POA: Diagnosis not present

## 2017-10-14 DIAGNOSIS — D838 Other common variable immunodeficiencies: Secondary | ICD-10-CM | POA: Diagnosis not present

## 2017-10-15 ENCOUNTER — Telehealth: Payer: Self-pay

## 2017-10-15 DIAGNOSIS — I519 Heart disease, unspecified: Secondary | ICD-10-CM

## 2017-10-15 NOTE — Telephone Encounter (Signed)
-----   Message from [place], [place] sent at 10/14/2017  5:18 PM EDT ----- Stress test shows no ischemia (no signs of reduced blood flow in heart arteries). This is good, however the overall pump function of his heart appears reduced on stress test. Recommend getting a 2D echo to further assess. Continue with plans with heart monitor.

## 2017-10-15 NOTE — Telephone Encounter (Signed)
Notes recorded by Frederik Schmidt, RN on 10/15/2017 at 9:01 AM EDT Informed patient of results/recommendations and ordered Echo for the patient. He verbalized understanding. ------

## 2017-10-18 ENCOUNTER — Other Ambulatory Visit: Payer: Self-pay

## 2017-10-18 ENCOUNTER — Ambulatory Visit (HOSPITAL_COMMUNITY): Payer: Medicare HMO | Attending: Cardiology

## 2017-10-18 DIAGNOSIS — I2510 Atherosclerotic heart disease of native coronary artery without angina pectoris: Secondary | ICD-10-CM | POA: Insufficient documentation

## 2017-10-18 DIAGNOSIS — E785 Hyperlipidemia, unspecified: Secondary | ICD-10-CM | POA: Diagnosis not present

## 2017-10-18 DIAGNOSIS — I252 Old myocardial infarction: Secondary | ICD-10-CM | POA: Insufficient documentation

## 2017-10-18 DIAGNOSIS — I059 Rheumatic mitral valve disease, unspecified: Secondary | ICD-10-CM | POA: Insufficient documentation

## 2017-10-18 DIAGNOSIS — I493 Ventricular premature depolarization: Secondary | ICD-10-CM | POA: Diagnosis not present

## 2017-10-18 DIAGNOSIS — I519 Heart disease, unspecified: Secondary | ICD-10-CM | POA: Diagnosis not present

## 2017-10-18 DIAGNOSIS — I110 Hypertensive heart disease with heart failure: Secondary | ICD-10-CM | POA: Insufficient documentation

## 2017-10-18 DIAGNOSIS — R0600 Dyspnea, unspecified: Secondary | ICD-10-CM | POA: Diagnosis not present

## 2017-10-20 ENCOUNTER — Telehealth: Payer: Self-pay | Admitting: *Deleted

## 2017-10-20 NOTE — Telephone Encounter (Signed)
Left message to call office

## 2017-10-20 NOTE — Telephone Encounter (Signed)
I spoke with pt and reviewed echo and monitor results with him.

## 2017-10-20 NOTE — Telephone Encounter (Signed)
-----   Message from Burnell Blanks, MD sent at 10/19/2017  8:59 AM EDT ----- LVEF still around 40%. This is unchanged. No valve disease. cdm

## 2017-10-20 NOTE — Telephone Encounter (Signed)
Burnell Blanks, MD  Thompson Grayer, RN        PVCs and PACs on monitor. These are rare. NO changes. Gerald Stabs

## 2017-11-02 DIAGNOSIS — C44319 Basal cell carcinoma of skin of other parts of face: Secondary | ICD-10-CM | POA: Diagnosis not present

## 2017-11-02 DIAGNOSIS — L720 Epidermal cyst: Secondary | ICD-10-CM | POA: Diagnosis not present

## 2017-11-02 DIAGNOSIS — D485 Neoplasm of uncertain behavior of skin: Secondary | ICD-10-CM | POA: Diagnosis not present

## 2017-11-02 DIAGNOSIS — L821 Other seborrheic keratosis: Secondary | ICD-10-CM | POA: Diagnosis not present

## 2017-11-08 ENCOUNTER — Ambulatory Visit: Payer: Medicare HMO | Admitting: Cardiology

## 2017-11-25 DIAGNOSIS — Z23 Encounter for immunization: Secondary | ICD-10-CM | POA: Diagnosis not present

## 2017-11-26 ENCOUNTER — Encounter: Payer: Self-pay | Admitting: Cardiology

## 2017-11-26 ENCOUNTER — Ambulatory Visit: Payer: Medicare HMO | Admitting: Cardiology

## 2017-11-26 VITALS — BP 122/70 | HR 60 | Ht 71.0 in | Wt 200.8 lb

## 2017-11-26 DIAGNOSIS — I5022 Chronic systolic (congestive) heart failure: Secondary | ICD-10-CM

## 2017-11-26 DIAGNOSIS — E785 Hyperlipidemia, unspecified: Secondary | ICD-10-CM

## 2017-11-26 DIAGNOSIS — Z79899 Other long term (current) drug therapy: Secondary | ICD-10-CM

## 2017-11-26 MED ORDER — LOSARTAN POTASSIUM 25 MG PO TABS: 12.5000 mg | ORAL_TABLET | Freq: Every day | ORAL | 3 refills | Status: DC

## 2017-11-26 MED ORDER — EZETIMIBE 10 MG PO TABS: 10.0000 mg | ORAL_TABLET | Freq: Every day | ORAL | 3 refills | Status: DC

## 2017-11-26 NOTE — Progress Notes (Signed)
11/26/2017 Joseph Soto   09/11/1945  [id number]  Primary Physician [name], MD Primary Cardiologist: Dr. Angelena Form   Reason for Visit/CC: F/u for Dyspnea.  HPI: Joseph Soto is a 72 y.o. male who is being seen today for the evaluation of exertional dyspnea. He is followed by Dr. Angelena Form with a history of CAD s/p CABG 2007, Non-hodgkins Lymphoma and HLD.  He had 4V CABG in 2007. Cath in 2010 showed occluded SVG to Diagonal and occluded SVG to PDA/PLA withpatent LIMA graft to the LAD. There was diffuse disease in distal vessels felt best to be managed medically. 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 Echo in April 2018 showed LVEF=40-45% and hypokinesis of the anteroseptal wall and apex. Nuclear stress test April 2018 with possible ischemia. Cardiac cath 06/17/16 with patent LIMA to LAD, all other grafts known to be occluded. There was severe stenosis in the proximal RCA treated with a drug eluting stent.   Recently, he contacted Dr. Angelena Form by patient e-mail through [place], asking for clearance to take Meloxicam for musculoskeletal/ arthritic pain 3-4 days a week, which was approved. Pt also given the ok to stop low dose ASA. He was continued on daily Plavix.   He was seen in clinic 10/05/17 with new complaint of exertional dyspnea. He denied any associated chest pain and no right arm pain which was present prior to undergoing bypass surgery in 2007.  He exercises routinely with a physical trainer.  He noted exertional dyspnea walking on a treadmill however it did not increase with change of intensity or speed.  Dyspnea remains stable.  He also noted feeling short of breath lifting weights and playing golf.  The dyspnea is present but does not limit him in regards to the amount of activity he is able to do.  He reports that he can walk on the treadmill briskly greater than 30 minutes without having to stop.  Denies associated palpitations.  No  lightheadedness dizziness, syncope/near syncope.  He also denies lower extremity edema, orthopnea and PND. His office EKG that day however showed frequent PVCs. Of note, during his stress test in 2018, he was noted to have frequent PACs and PVCs.    Given his cardiac history and fact that he only had 1 out of 4 patent grafts at time of last cath, I recommended repeat exercise Myoview to assess for ischemia in the LAD territory (LIMA-LAD was patent on last cath in 2018).  Given the fact that he had frequent PACs and PVCs on last stress test,  I also ordered a 48-hour Holter monitor to assess for any arrhythmias that may explain his symptoms.  Holter monitor showed sinus rhythm. Rare premature ventricular contractions (642 during monitoring period, 0.28% of total beats), Rare premature atrial contractions (36 during the monitoring period), No atrial fibrillation, SVT or VT.  Stress test showed no ischemia. It was interpreted as intermediate risk due to reduced EF, consistent with prior. EF 35-40%. No significant valvular abnormalties were noted on echo.   He presents back today. Feels ok.  Symptoms unchanged. Still gets winded if he over exerts himself but no symptoms or limiations with ADLs. Denies CP.    Current Meds  Medication Sig  . cholecalciferol (VITAMIN D) 1000 units tablet Take 1,000 Units by mouth daily.  . clopidogrel (PLAVIX) 75 MG tablet TAKE 1 TABLET (75 MG TOTAL) BY MOUTH DAILY WITH BREAKFAST.  Marland Kitchen Coenzyme Q10 300 MG CAPS Take 300  mg by mouth daily.  . cyclobenzaprine (FLEXERIL) 10 MG tablet Take 10 mg by mouth 2 (two) times daily.  Marland Kitchen L-Arginine 1000 MG TABS Take 1,000 mg by mouth 2 (two) times daily.  . Magnesium Gluconate 550 MG TABS Take 550 mg by mouth every Monday, Wednesday, and Friday.  . meloxicam (MOBIC) 15 MG tablet Take 15 mg by mouth daily as needed for pain.  . metoprolol succinate (TOPROL-XL) 25 MG 24 hr tablet Take 12.5 mg by mouth 2 (two) times daily.  . naltrexone  (DEPADE) 50 MG tablet Take by mouth 2 (two) times daily.  . nitroGLYCERIN (NITROSTAT) 0.4 MG SL tablet Place 1 tablet (0.4 mg total) under the tongue every 5 (five) minutes as needed for chest pain.  . Probiotic CAPS Take 1 capsule by mouth 2 (two) times daily.  . rosuvastatin (CRESTOR) 40 MG tablet TAKE 1 TABLET BY MOUTH EVERY DAY  . triamcinolone (NASACORT) 55 MCG/ACT AERO nasal inhaler Place 1 spray into the nose daily as needed (for allergies).    Allergies  Allergen Reactions  . Niacin And Related Nausea And Vomiting  . Niaspan [Niacin Er] Nausea And Vomiting and Other (See Comments)    Stomach intolerance   Past Medical History:  Diagnosis Date  . Buttock pain   . CAD (coronary artery disease)    Last cath 2010 per Dr. Doreatha Lew with diffuse multivessel disease, small caliber vessels not felt to be suitable for PCI  . Chronic left hip pain   . Dizziness    1 episode   . Fibromyalgia   . GERD (gastroesophageal reflux disease)   . Head pain    "not chronic; I have myofasial tightening in my head that causes pain in my skull" (06/17/2016)  . HLD (hyperlipidemia)   . Hypertension   . Myeloma ([place])    "found it in my chest when they did the excision"  . Myocardial infarction ([place]) 06/2005  . Non Hodgkin's lymphoma ([place]) dx'd 06/2005   Family History  Problem Relation Age of Onset  . Heart disease Father   . Heart attack Father   . Dementia Mother    Past Surgical History:  Procedure Laterality Date  . CARDIAC CATHETERIZATION  ~ 2008   "Dr. Doreatha Lew"  . CATARACT EXTRACTION W/ INTRAOCULAR LENS IMPLANT Right   . CORONARY ANGIOPLASTY WITH STENT PLACEMENT  06/17/2016  . CORONARY ARTERY BYPASS GRAFT  5/07   x5. LV dysfunction.   . CORONARY STENT INTERVENTION N/A 06/17/2016   Procedure: Coronary Stent Intervention;  Surgeon: Burnell Blanks, MD;  Location: [facility];  Service: Cardiovascular;  Laterality: N/A;  . LAPAROSCOPIC CHOLECYSTECTOMY    . LEFT HEART CATH AND  CORS/GRAFTS ANGIOGRAPHY N/A 06/17/2016   Procedure: Left Heart Cath and Cors/Grafts Angiography;  Surgeon: Burnell Blanks, MD;  Location: [facility];  Service: Cardiovascular;  Laterality: N/A;  . MASS EXCISION N/A 11/11/2015   Procedure: EXCISION OF MID CHEST  MASS;  Surgeon: Judeth Horn, MD;  Location: [facility];  Service: General;  Laterality: N/A;  . MASS EXCISION N/A 12/23/2015   Procedure: REEXCISION OF CHEST WALL TUMOR SITE;  Surgeon: Judeth Horn, MD;  Location: [facility];  Service: General;  Laterality: N/A;  . POPLITEAL SYNOVIAL CYST EXCISION Right   . TONSILLECTOMY     Social History   Socioeconomic History  . Marital status: Married    Spouse name: [name]  . Number of children: 2  . Years  of education: college  . Highest education level: Not on file  Occupational History  . Occupation: Camera operator  Social Needs  . Financial resource strain: Not on file  . Food insecurity:    Worry: Not on file    Inability: Not on file  . Transportation needs:    Medical: Not on file    Non-medical: Not on file  Tobacco Use  . Smoking status: Former Smoker    Packs/day: 1.50    Years: 20.00    Pack years: 30.00    Types: Cigarettes  . Smokeless tobacco: Never Used  . Tobacco comment: "quit 06/2005"  Substance and Sexual Activity  . Alcohol use: No    Alcohol/week: 0.0 standard drinks  . Drug use: No  . Sexual activity: Not on file  Lifestyle  . Physical activity:    Days per week: Not on file    Minutes per session: Not on file  . Stress: Not on file  Relationships  . Social connections:    Talks on phone: Not on file    Gets together: Not on file    Attends religious service: Not on file    Active member of club or organization: Not on file    Attends meetings of clubs or organizations: Not on file    Relationship status: Not on file  . Intimate partner violence:    Fear of current or ex partner: Not on file     Emotionally abused: Not on file    Physically abused: Not on file    Forced sexual activity: Not on file  Other Topics Concern  . Not on file  Social History Narrative   Married, 2 children.   Runs an executive recruiting company.    Left-handed.   Drinks a bottle of green tea.     Review of Systems: General: negative for chills, fever, night sweats or weight changes.  Cardiovascular: negative for chest pain, dyspnea on exertion, edema, orthopnea, palpitations, paroxysmal nocturnal dyspnea or shortness of breath Dermatological: negative for rash Respiratory: negative for cough or wheezing Urologic: negative for hematuria Abdominal: negative for nausea, vomiting, diarrhea, bright red blood per rectum, melena, or hematemesis Neurologic: negative for visual changes, syncope, or dizziness All other systems reviewed and are otherwise negative except as noted above.   Physical Exam:  Blood pressure 122/70, pulse 60, height 5\' 11"  (1.803 m), weight 200 lb 12.8 oz (91.1 kg), SpO2 90 %.  General appearance: alert, cooperative and no distress Neck: no carotid bruit and no JVD Lungs: clear to auscultation bilaterally Heart: regular rate and rhythm, S1, S2 normal, no murmur, click, rub or gallop Extremities: extremities normal, atraumatic, no cyanosis or edema Pulses: 2+ and symmetric Skin: Skin color, texture, turgor normal. No rashes or lesions Neurologic: Grossly normal  EKG not performed -- personally reviewed   ASSESSMENT AND PLAN:   1. CAD: per above. H/o CABG. Last Cardiac cath 06/17/16 with patent LIMA to LAD, all other grafts known to be occluded. There was severe stenosis in the proximal RCA treated with a drug eluting stent. No chest pain but recent exertional dyspnea with moderate to heavy activity. Subsequent NST showed no ischemia. EF reduced but c/w with prior echo in 2018. Continue medical therapy but will adjust lipid regimen for better control of LDL.    2. 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 NST low risk. He appears euvolemic on exam. Will continue BB but will add low dose ARB, losartan 12.5 mg  once daily. He will monitor BP closely at home. Will get f/u BMP in 7 days (recent BMP showed SCr at 1.3 and normal K).  3. HLD: LDL not at goal. Recent LDL was 91 mg/dL. He reports full compliance with Crestor 40 mg. Will add Zetia 10 mg daily. Repeat FLP and HFTs in 8 weeks.   Follow-Up f/u with Dr. Angelena Form as planned.   Felicita Nuncio Ladoris Gene, MHS [facility] 11/26/2017 12:47 PM

## 2017-11-26 NOTE — Patient Instructions (Signed)
Medication Instructions:  Your physician has recommended you make the following change in your medication:   1. START LOSARTAN 25 MG. TAKE HALF-TABLET (12.5 MG) DAILY.  2. START ZETIA 10 MG DAILY.  If you need a refill on your cardiac medications before your next appointment, please call your pharmacy.   Lab work: LABS TO BE DONE IN 7 DAYS: BMET LABS TO BE REPEATED IN 8 WEEKS : FASTING LIPID AND LFTS  If you have labs (blood work) drawn today and your tests are completely normal, you will receive your results only by: Marland Kitchen MyChart Message (if you have MyChart) OR . A paper copy in the mail If you have any lab test that is abnormal or we need to change your treatment, we will call you to review the results.  Testing/Procedures: NONE  Follow-Up: At [facility], you and your health needs are our priority.  As part of our continuing mission to provide you with exceptional heart care, we have created designated Provider Care Teams.  These Care Teams include your primary Cardiologist (physician) and Advanced Practice Providers (APPs -  Physician Assistants and Nurse Practitioners) who all work together to provide you with the care you need, when you need it. You will need a follow up appointment in December.   You may see Lauree Chandler, MD or one of the following Advanced Practice Providers on your designated Care Team:   Dustin Acres, PA-C Melina Copa, PA-C . Ermalinda Barrios, PA-C  Any Other Special Instructions Will Be Listed Below (If Applicable).  PLEASE MAKE SURE TO MONITOR YOUR BLOOD PRESSURE AT HOME.  Cholesterol Cholesterol is a fat. Your body needs a small amount of cholesterol. Cholesterol (plaque) may build up in your blood vessels (arteries). That makes you more likely to have a heart attack or stroke. You cannot feel your cholesterol level. Having a blood test is the only way to find out if your level is high. Keep your test results. Work with your doctor to keep  your cholesterol at a good level. What do the results mean?  Total cholesterol is how much cholesterol is in your blood.  LDL is bad cholesterol. This is the type that can build up. Try to have low LDL.  HDL is good cholesterol. It cleans your blood vessels and carries LDL away. Try to have high HDL.  Triglycerides are fat that the body can store or burn for energy. What are good levels of cholesterol?  Total cholesterol below 200.  LDL below 100 is good for people who have health risks. LDL below 70 is good for people who have very high risks.  HDL above 40 is good. It is best to have HDL of 60 or higher.  Triglycerides below 150. How can I lower my cholesterol? Diet Follow your diet program as told by your doctor.  Choose fish, white meat chicken, or [place] that is roasted or baked. Try not to eat red meat, fried foods, sausage, or lunch meats.  Eat lots of fresh fruits and vegetables.  Choose whole grains, beans, pasta, potatoes, and cereals.  Choose olive oil, corn oil, or canola oil. Only use small amounts.  Try not to eat butter, mayonnaise, shortening, or palm kernel oils.  Try not to eat foods with trans fats.  Choose low-fat or nonfat dairy foods. ? Drink skim or nonfat milk. ? Eat low-fat or nonfat yogurt and cheeses. ? Try not to drink whole milk or cream. ? Try not to eat ice cream,  egg yolks, or full-fat cheeses.  Healthy desserts include angel food cake, ginger snaps, animal crackers, hard candy, popsicles, and low-fat or nonfat frozen yogurt. Try not to eat pastries, cakes, pies, and cookies.  Exercise Follow your exercise program as told by your doctor.  Be more active. Try gardening, walking, and taking the stairs.  Ask your doctor about ways that you can be more active.  Medicine  Take over-the-counter and prescription medicines only as told by your doctor. This information is not intended to replace advice given to you by your health care  provider. Make sure you discuss any questions you have with your health care provider. Document Released: 04/24/2008 Document Revised: 08/28/2015 Document Reviewed: 08/08/2015 Elsevier Interactive Patient Education  [employer].

## 2017-12-03 ENCOUNTER — Other Ambulatory Visit: Payer: Medicare HMO

## 2017-12-06 ENCOUNTER — Other Ambulatory Visit: Payer: Medicare HMO | Admitting: *Deleted

## 2017-12-06 DIAGNOSIS — I5022 Chronic systolic (congestive) heart failure: Secondary | ICD-10-CM | POA: Diagnosis not present

## 2017-12-06 DIAGNOSIS — Z79899 Other long term (current) drug therapy: Secondary | ICD-10-CM

## 2017-12-06 LAB — BASIC METABOLIC PANEL
BUN/Creatinine Ratio: 17 (ref 10–24)
BUN: 23 mg/dL (ref 8–27)
CO2: 22 mmol/L (ref 20–29)
Calcium: 8.7 mg/dL (ref 8.6–10.2)
Chloride: 103 mmol/L (ref 96–106)
Creatinine, Ser: 1.34 mg/dL — ABNORMAL HIGH (ref 0.76–1.27)
GFR calc Af Amer: 60 mL/min/{1.73_m2} (ref >59)
GFR calc non Af Amer: 52 mL/min/{1.73_m2} — ABNORMAL LOW (ref >59)
Glucose: 99 mg/dL (ref 65–99)
Potassium: 4.1 mmol/L (ref 3.5–5.2)
Sodium: 138 mmol/L (ref 134–144)

## 2017-12-07 DIAGNOSIS — Z85828 Personal history of other malignant neoplasm of skin: Secondary | ICD-10-CM | POA: Diagnosis not present

## 2017-12-07 DIAGNOSIS — C44319 Basal cell carcinoma of skin of other parts of face: Secondary | ICD-10-CM | POA: Diagnosis not present

## 2017-12-14 DIAGNOSIS — Z4802 Encounter for removal of sutures: Secondary | ICD-10-CM | POA: Diagnosis not present

## 2018-01-03 DIAGNOSIS — N183 Chronic kidney disease, stage 3 (moderate): Secondary | ICD-10-CM | POA: Diagnosis not present

## 2018-01-03 DIAGNOSIS — Z7689 Persons encountering health services in other specified circumstances: Secondary | ICD-10-CM | POA: Diagnosis not present

## 2018-01-12 ENCOUNTER — Other Ambulatory Visit: Payer: Self-pay | Admitting: Internal Medicine

## 2018-01-12 DIAGNOSIS — R7989 Other specified abnormal findings of blood chemistry: Secondary | ICD-10-CM

## 2018-01-12 DIAGNOSIS — Z6827 Body mass index (BMI) 27.0-27.9, adult: Secondary | ICD-10-CM | POA: Diagnosis not present

## 2018-01-12 DIAGNOSIS — R945 Abnormal results of liver function studies: Principal | ICD-10-CM

## 2018-01-12 DIAGNOSIS — J111 Influenza due to unidentified influenza virus with other respiratory manifestations: Secondary | ICD-10-CM | POA: Diagnosis not present

## 2018-01-12 DIAGNOSIS — J069 Acute upper respiratory infection, unspecified: Secondary | ICD-10-CM | POA: Diagnosis not present

## 2018-01-13 ENCOUNTER — Telehealth: Payer: Self-pay | Admitting: *Deleted

## 2018-01-13 NOTE — Telephone Encounter (Signed)
Called and requested to be seen by Dr. Benay Spice as soon as he can be worked in. His PCP found a "lump in my neck" and told him he should contact Dr. Benay Spice. He reports it is on left side and is about pea sized. Not tender.  Has had some low grade fever, but also recently had a viral infection with generalized aches. Denies any sweats or weight loss. Tells RN that he is very worried.

## 2018-01-13 NOTE — Telephone Encounter (Signed)
Dr. Benay Spice will see him on 01/17/18 at 10:30 am. He agrees

## 2018-01-17 ENCOUNTER — Inpatient Hospital Stay: Payer: Medicare HMO | Attending: Oncology | Admitting: Oncology

## 2018-01-17 VITALS — BP 126/78 | HR 74 | Temp 97.5°F | Resp 18 | Ht 71.0 in | Wt 192.9 lb

## 2018-01-17 DIAGNOSIS — I252 Old myocardial infarction: Secondary | ICD-10-CM | POA: Insufficient documentation

## 2018-01-17 DIAGNOSIS — E785 Hyperlipidemia, unspecified: Secondary | ICD-10-CM | POA: Insufficient documentation

## 2018-01-17 DIAGNOSIS — I2510 Atherosclerotic heart disease of native coronary artery without angina pectoris: Secondary | ICD-10-CM | POA: Diagnosis not present

## 2018-01-17 DIAGNOSIS — C493 Malignant neoplasm of connective and soft tissue of thorax: Secondary | ICD-10-CM | POA: Diagnosis not present

## 2018-01-17 DIAGNOSIS — Z8572 Personal history of non-Hodgkin lymphomas: Secondary | ICD-10-CM | POA: Diagnosis not present

## 2018-01-17 NOTE — Progress Notes (Signed)
  [facility] OFFICE PROGRESS NOTE   Diagnosis: Leiomyosarcoma, nonhospice [place]  INTERVAL HISTORY:   Joseph Soto returns prior to his scheduled visit.  He developed an upper respiratory infection last week with a cough, rhinorrhea, and a low-grade fever.  He was prescribed antibiotics by his primary provider.  They noted an enlarged lymph node in the left neck.  He had not appreciated this prior to the office visit next week.  No consistent fever, night sweats, or anorexia.  The respiratory infection is resolving.  Objective:  Vital signs in last 24 hours:  Blood pressure 126/78, pulse 74, temperature (!) 97.5 F (36.4 C), temperature source Oral, resp. rate 18, height 5\' 11"  (1.803 m), weight 192 lb 14.4 oz (87.5 kg), SpO2 97 %.    HEENT: Oropharynx without visible mass, neck without mass Lymphatics: 1.5-2 cm left anterior cervical node overlying the carotid pulse, lymph node is superior and posterior to the carotid pulse, no other cervical, supraclavicular, axillary, or inguinal nodes Resp: Lungs clear bilaterally Cardio: Regular rate and rhythm GI: No hepatosplenomegaly, no mass, nontender Vascular: No leg edema  Skin: Anterior chest scar without evidence of recurrent tumor    Lab Results:  Lab Results  Component Value Date   WBC 10.8 (H) 06/12/2017   HGB 13.0 06/12/2017   HCT 39.2 06/12/2017   MCV 88.3 06/12/2017   PLT 243 06/12/2017   NEUTROABS 8.4 (H) 12/20/2015    CMP  Lab Results  Component Value Date   NA 138 12/06/2017   K 4.1 12/06/2017   CL 103 12/06/2017   CO2 22 12/06/2017   GLUCOSE 99 12/06/2017   BUN 23 12/06/2017   CREATININE 1.34 (H) 12/06/2017   CALCIUM 8.7 12/06/2017   PROT 6.3 (L) 06/12/2017   ALBUMIN 3.6 06/12/2017   AST 23 06/12/2017   ALT 16 (L) 06/12/2017   ALKPHOS 45 06/12/2017   BILITOT 0.7 06/12/2017   GFRNONAA 52 (L) 12/06/2017   GFRAA 60 12/06/2017    Medications: I have reviewed the patient's current  medications.   Assessment/Plan: 1. Leiomyosarcoma of the anterior chest wall, status post an excisional biopsy on 11/11/2015 confirming a leiomyosarcoma ? Positive lateral surgical margin, 2 x 0.6 cm, up to 5 mitoses per 10 high-powered fields ? Reexcision 12/23/2015-no leiomyosarcoma, negative resection margins  2. 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 History of coronary artery disease/myocardial infarction-status post coronary artery bypass surgery in 2007, status post placement of a RCA stent May 2018  4. Hyperlipidemia    Disposition: Joseph Soto mains in clinical remission from the leiomyosarcoma.  There is an enlarged left anterior cervical node on exam.  This may be related to the recent upper respiratory infection or the history of low-grade lymphoma.  There is no other evidence for progression of the lymphoma.  He will return for an office visit as scheduled in approximately 2 months.  He will contact us sooner for enlargement of the palpable lymph node or new symptoms.  We will consider obtaining a aging CT if the palpable lymphadenopathy persists.  Betsy Coder, MD  01/17/2018  11:07 AM

## 2018-01-17 NOTE — Patient Instructions (Signed)
At next visit, please bring copy of your Advanced Directive/Living Will to scan into record

## 2018-01-21 ENCOUNTER — Other Ambulatory Visit: Payer: Medicare HMO

## 2018-01-24 ENCOUNTER — Other Ambulatory Visit: Payer: Medicare HMO | Admitting: *Deleted

## 2018-01-24 DIAGNOSIS — Z79899 Other long term (current) drug therapy: Secondary | ICD-10-CM

## 2018-01-24 DIAGNOSIS — I5022 Chronic systolic (congestive) heart failure: Secondary | ICD-10-CM | POA: Diagnosis not present

## 2018-01-24 DIAGNOSIS — E785 Hyperlipidemia, unspecified: Secondary | ICD-10-CM | POA: Diagnosis not present

## 2018-01-24 LAB — HEPATIC FUNCTION PANEL
ALT: 49 IU/L — ABNORMAL HIGH (ref 0–44)
AST: 35 IU/L (ref 0–40)
Albumin: 3.5 g/dL (ref 3.5–4.8)
Alkaline Phosphatase: 83 IU/L (ref 39–117)
Bilirubin Total: 0.3 mg/dL (ref 0.0–1.2)
Bilirubin, Direct: 0.12 mg/dL (ref 0.00–0.40)
Total Protein: 5.9 g/dL — ABNORMAL LOW (ref 6.0–8.5)

## 2018-01-24 LAB — LIPID PANEL
Chol/HDL Ratio: 2.5 ratio (ref 0.0–5.0)
Cholesterol, Total: 116 mg/dL (ref 100–199)
HDL: 46 mg/dL (ref >39)
LDL Calculated: 52 mg/dL (ref 0–99)
Triglycerides: 90 mg/dL (ref 0–149)
VLDL Cholesterol Cal: 18 mg/dL (ref 5–40)

## 2018-01-27 ENCOUNTER — Telehealth: Payer: Self-pay

## 2018-01-27 DIAGNOSIS — Z79899 Other long term (current) drug therapy: Secondary | ICD-10-CM

## 2018-01-27 NOTE — Telephone Encounter (Signed)
Notes recorded by Frederik Schmidt, RN on 01/27/2018 at 2:50 PM EST The patient has been notified of the result and verbalized understanding. HFTs 1/20. All questions (if any) were answered. Frederik Schmidt, RN 01/27/2018 2:50 PM

## 2018-01-27 NOTE — Telephone Encounter (Signed)
-----   Message from [place], [place] sent at 01/27/2018 12:43 PM EST ----- Cholesterol is much improved and bad cholesterol is now at goal of < 70, at 52. This is great. Continue current medications. One of his liver enzymes is mildly elevated, but not at the level to warrant stopping his statin. Would recommend repeat HFTs in 4 weeks. Call us if any symptoms or side effects.

## 2018-02-11 ENCOUNTER — Other Ambulatory Visit: Payer: Self-pay | Admitting: Cardiovascular Disease

## 2018-02-16 ENCOUNTER — Telehealth: Payer: Self-pay

## 2018-02-16 NOTE — Telephone Encounter (Signed)
Left patient message informing him that it is a FLP on 02/17/2018, along with his OV with Dr Angelena Form.

## 2018-02-16 NOTE — Telephone Encounter (Signed)
Left patient a message that he may come in early for labs, but that he would have to come back for appt with Dr Angelena Form in the afternoon.

## 2018-02-16 NOTE — Telephone Encounter (Signed)
Spoke to patient and informed him that he did not have to come in tomorrow for lab work.  He verbalized understanding.

## 2018-02-17 ENCOUNTER — Ambulatory Visit: Payer: PPO | Admitting: Cardiovascular Disease

## 2018-02-17 ENCOUNTER — Encounter: Payer: Self-pay | Admitting: Cardiovascular Disease

## 2018-02-17 VITALS — BP 116/58 | HR 83 | Ht 71.0 in | Wt 201.8 lb

## 2018-02-17 DIAGNOSIS — I25810 Atherosclerosis of coronary artery bypass graft(s) without angina pectoris: Secondary | ICD-10-CM | POA: Diagnosis not present

## 2018-02-17 DIAGNOSIS — I255 Ischemic cardiomyopathy: Secondary | ICD-10-CM | POA: Diagnosis not present

## 2018-02-17 DIAGNOSIS — E7800 Pure hypercholesterolemia, unspecified: Secondary | ICD-10-CM | POA: Diagnosis not present

## 2018-02-17 NOTE — Patient Instructions (Signed)

## 2018-02-17 NOTE — Progress Notes (Signed)
Chief Complaint  Patient presents with  . Follow-up    CAD    History of Present Illness: 73 yo male with history of CAD s/p CABG 2007, Non-hodgkins Lymphoma, HLD here today for cardiac follow up. He had a 4V CABG in 2007. Cath in 2010 showed occluded SVG to Diagonal and occluded SVG to PDA/PLA withpatent LIMA graft to the  LAD. There was diffuse disease in distal vessels felt best to be managed medically. 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 Echo April 2018 with LVEF=40-45% and hypokinesis of the anteroseptal wall and apex. Nuclear stress test April 2018 with possible ischemia. Cardiac cath 06/17/16 with patent LIMA to LAD, all other grafts known to be occluded. Severe stenosis in the proximal RCA treated with a drug eluting stent. He was seen in our office August 2019 by Lyda Jester, PA-C with c/o dyspnea with exertion. Cardiac monitor with sinus rhythm, rare PVCs, rare PACs. Nuclear stress test without ischemia. Echo September 2019 with LVEF=40%, trivial MR. He was tried on Cozaar in October 2019 but did not tolerate due to dizziness and presumed hypotension.    He is here today for follow up. The patient denies any chest pain, palpitations, lower extremity edema, orthopnea, PND, dizziness, near syncope or syncope. He has rare dyspnea. He exercise daily on the treadmill and can do this without dyspnea.   Primary Care Physician: [name], MD  Past Medical History:  Diagnosis Date  . Buttock pain   . CAD (coronary artery disease)    Last cath 2010 per Dr. Doreatha Lew with diffuse multivessel disease, small caliber vessels not felt to be suitable for PCI  . Chronic left hip pain   . Dizziness    1 episode   . Fibromyalgia   . GERD (gastroesophageal reflux disease)   . Head pain    "not chronic; I have myofasial tightening in my head that causes pain in my skull" (06/17/2016)  . HLD (hyperlipidemia)   . Hypertension   . Myeloma ([place])    "found it in my chest when they did the excision"  . Myocardial infarction ([place]) 06/2005  . Non Hodgkin's lymphoma ([place]) dx'd 06/2005    Past Surgical History:  Procedure Laterality Date  . CARDIAC CATHETERIZATION  ~ 2008   "Dr. Doreatha Lew"  . CATARACT EXTRACTION W/ INTRAOCULAR LENS IMPLANT Right   . CORONARY ANGIOPLASTY WITH STENT PLACEMENT  06/17/2016  . CORONARY ARTERY BYPASS GRAFT  5/07   x5. LV dysfunction.   . CORONARY STENT INTERVENTION N/A 06/17/2016   Procedure: Coronary Stent Intervention;  Surgeon: Burnell Blanks, MD;  Location: [facility];  Service: Cardiovascular;  Laterality: N/A;  . LAPAROSCOPIC CHOLECYSTECTOMY    . LEFT HEART CATH AND CORS/GRAFTS ANGIOGRAPHY N/A 06/17/2016   Procedure: Left Heart Cath and Cors/Grafts Angiography;  Surgeon: Burnell Blanks, MD;  Location: [facility];  Service: Cardiovascular;  Laterality: N/A;  . MASS EXCISION N/A 11/11/2015   Procedure: EXCISION OF MID CHEST  MASS;  Surgeon: Judeth Horn, MD;  Location: [facility];  Service: General;  Laterality: N/A;  . MASS EXCISION N/A 12/23/2015   Procedure: REEXCISION OF CHEST WALL TUMOR SITE;  Surgeon: Judeth Horn, MD;  Location: [facility];  Service: General;  Laterality: N/A;  . POPLITEAL SYNOVIAL CYST EXCISION Right   . TONSILLECTOMY      Current Outpatient Medications  Medication Sig Dispense Refill  . cholecalciferol (VITAMIN  D) 1000 units tablet Take 1,000 Units by mouth daily.    . clopidogrel (PLAVIX) 75 MG tablet TAKE 1 TABLET (75 MG TOTAL) BY MOUTH DAILY WITH BREAKFAST. 90 tablet 2  . Coenzyme Q10 300 MG CAPS Take 300 mg by mouth daily.    . cyclobenzaprine (FLEXERIL) 10 MG tablet Take 10 mg by mouth 2 (two) times daily.    Marland Kitchen ezetimibe (ZETIA) 10 MG tablet Take 1 tablet (10 mg total) by mouth daily. 90 tablet 3  . L-Arginine 1000 MG TABS Take 1,000 mg by mouth 2 (two) times daily.    . Magnesium Gluconate 550 MG TABS Take 550 mg by  mouth every Monday, Wednesday, and Friday.    . meloxicam (MOBIC) 15 MG tablet Take 15 mg by mouth daily as needed for pain.    . metoprolol succinate (TOPROL-XL) 25 MG 24 hr tablet Take 12.5 mg by mouth 2 (two) times daily.    . naltrexone (DEPADE) 50 MG tablet Take by mouth 2 (two) times daily.    . nitroGLYCERIN (NITROSTAT) 0.4 MG SL tablet Place 1 tablet (0.4 mg total) under the tongue every 5 (five) minutes as needed for chest pain. 25 tablet 3  . Probiotic CAPS Take 1 capsule by mouth 2 (two) times daily.    . rosuvastatin (CRESTOR) 40 MG tablet TAKE 1 TABLET BY MOUTH EVERY DAY 90 tablet 2  . triamcinolone (NASACORT) 55 MCG/ACT AERO nasal inhaler Place 1 spray into the nose daily as needed (for allergies).      No current facility-administered medications for this visit.     Allergies  Allergen Reactions  . Niacin And Related Nausea And Vomiting  . Niaspan [Niacin Er] Nausea And Vomiting and Other (See Comments)    Stomach intolerance    Social History   Socioeconomic History  . Marital status: Married    Spouse name: [name]  . Number of children: 2  . Years of education: college  . Highest education level: Not on file  Occupational History  . Occupation: Camera operator  Social Needs  . Financial resource strain: Not on file  . Food insecurity:    Worry: Not on file    Inability: Not on file  . Transportation needs:    Medical: Not on file    Non-medical: Not on file  Tobacco Use  . Smoking status: Former Smoker    Packs/day: 1.50    Years: 20.00    Pack years: 30.00    Types: Cigarettes  . Smokeless tobacco: Never Used  . Tobacco comment: "quit 06/2005"  Substance and Sexual Activity  . Alcohol use: No    Alcohol/week: 0.0 standard drinks  . Drug use: No  . Sexual activity: Not on file  Lifestyle  . Physical activity:    Days per week: Not on file    Minutes per session: Not on file  . Stress: Not on file  Relationships  . Social connections:     Talks on phone: Not on file    Gets together: Not on file    Attends religious service: Not on file    Active member of club or organization: Not on file    Attends meetings of clubs or organizations: Not on file    Relationship status: Not on file  . Intimate partner violence:    Fear of current or ex partner: Not on file    Emotionally abused: Not on file    Physically abused: Not on file  Forced sexual activity: Not on file  Other Topics Concern  . Not on file  Social History Narrative   Married, 2 children.   Runs an executive recruiting company.    Left-handed.   Drinks a bottle of green tea.    Family History  Problem Relation Age of Onset  . Heart disease Father   . Heart attack Father   . Dementia Mother     Review of Systems:  As stated in the HPI and otherwise negative.   BP (!) 116/58   Pulse 83   Ht 5\' 11"  (1.803 m)   Wt 201 lb 12.8 oz (91.5 kg)   SpO2 94%   BMI 28.15 kg/m   Physical Examination:  General: Well developed, well nourished, NAD  HEENT: OP clear, mucus membranes moist  SKIN: warm, dry. No rashes. Neuro: No focal deficits  Musculoskeletal: Muscle strength 5/5 all ext  Psychiatric: Mood and affect normal  Neck: No JVD, no carotid bruits, no thyromegaly, no lymphadenopathy.  Lungs:Clear bilaterally, no wheezes, rhonci, crackles Cardiovascular: Regular rate and rhythm. No murmurs, gallops or rubs. Abdomen:Soft. Bowel sounds present. Non-tender.  Extremities: No lower extremity edema. Pulses are 2 + in the bilateral DP/PT.  Echo September 2019: - Left ventricle: The cavity size was normal. Wall thickness was   increased in a pattern of mild LVH. Systolic function was   moderately reduced. The estimated ejection fraction was in the   range of 35% to 40%. Anteroseptal, inferoseptal, and anterior   hypokinesis. Doppler parameters are consistent with abnormal left   ventricular relaxation (grade 1 diastolic dysfunction). - Aortic valve:  There was no stenosis. - Mitral valve: Mildly calcified annulus. There was trivial   regurgitation. - Right ventricle: The cavity size was normal. Systolic function   was mildly reduced. - Tricuspid valve: Peak RV-RA gradient (S): 27 mm Hg. - Pulmonary arteries: PA peak pressure: 30 mm Hg (S). - Inferior vena cava: The vessel was normal in size. The   respirophasic diameter changes were in the normal range (>= 50%),   consistent with normal central venous pressure.  Impressions:  - Normal LV size with mild LV hypertrophy. EF 35-40% with   anteroseptal, inferoseptal, and anterior hypokinesis. Normal RV   size with mildly decreased systolic function. No significant   valvular abnormalities.   EKG:  EKG is  Not ordered today. The ekg ordered today demonstrates    Recent Labs: 06/12/2017: Hemoglobin 13.0; Platelets 243 12/06/2017: BUN 23; Creatinine, Ser 1.34; Potassium 4.1; Sodium 138 01/24/2018: ALT 49   Lipid Panel Followed in primary care   Wt Readings from Last 3 Encounters:  02/17/18 201 lb 12.8 oz (91.5 kg)  01/17/18 192 lb 14.4 oz (87.5 kg)  11/26/17 200 lb 12.8 oz (91.1 kg)     Other studies Reviewed: Additional studies/ records that were reviewed today include: . Review of the above records demonstrates:    Assessment and Plan:   1. CAD without angina: Cardiac cath May 2018 with patent LIMA to LAD, severe disease in the RCA and moderately severe disease in the circumflex. A drug eluting stent was placed in the RCA. He does have moderately severe disease in the Circumflex that is unchanged. Nuclear stress test September 2019 with no ischemia. Continue ASA, Plavix, beta blocker and statin.     2. Hyperlipidemia:Lipids followed in primary care. LDL 52 in primary care in December 2019. Continue statin.    3. Ischemic cardiomyopathy: LVEF=35-40% by echo September 2019. Continue Toprol.  He did not tolerate low dose Cozaar due to hypotension and dizziness. I suspect  that he will not tolerate afterload reducing agents.   Current medicines are reviewed at length with the patient today.  The patient does not have concerns regarding medicines.  The following changes have been made:  no change  Labs/ tests ordered today include:   No orders of the defined types were placed in this encounter.   Disposition:   FU with me in 6  months  Signed, Lauree Chandler, MD 02/17/2018 2:35 PM    [facility] [place], [address], [place]  [address] Phone: [phone]; Fax: [phone]

## 2018-02-28 ENCOUNTER — Other Ambulatory Visit: Payer: PPO | Admitting: *Deleted

## 2018-02-28 DIAGNOSIS — Z79899 Other long term (current) drug therapy: Secondary | ICD-10-CM

## 2018-02-28 LAB — HEPATIC FUNCTION PANEL
ALT: 55 IU/L — ABNORMAL HIGH (ref 0–44)
AST: 66 IU/L — ABNORMAL HIGH (ref 0–40)
Albumin: 3.7 g/dL (ref 3.7–4.7)
Alkaline Phosphatase: 64 IU/L (ref 39–117)
Bilirubin Total: 0.4 mg/dL (ref 0.0–1.2)
Bilirubin, Direct: 0.14 mg/dL (ref 0.00–0.40)
Total Protein: 6 g/dL (ref 6.0–8.5)

## 2018-03-03 ENCOUNTER — Telehealth: Payer: Self-pay

## 2018-03-03 DIAGNOSIS — M545 Low back pain: Secondary | ICD-10-CM | POA: Diagnosis not present

## 2018-03-03 DIAGNOSIS — I10 Essential (primary) hypertension: Secondary | ICD-10-CM | POA: Diagnosis not present

## 2018-03-03 DIAGNOSIS — J439 Emphysema, unspecified: Secondary | ICD-10-CM | POA: Diagnosis not present

## 2018-03-03 DIAGNOSIS — R945 Abnormal results of liver function studies: Secondary | ICD-10-CM | POA: Diagnosis not present

## 2018-03-03 DIAGNOSIS — C499 Malignant neoplasm of connective and soft tissue, unspecified: Secondary | ICD-10-CM | POA: Diagnosis not present

## 2018-03-03 DIAGNOSIS — E7849 Other hyperlipidemia: Secondary | ICD-10-CM | POA: Diagnosis not present

## 2018-03-03 DIAGNOSIS — I2510 Atherosclerotic heart disease of native coronary artery without angina pectoris: Secondary | ICD-10-CM | POA: Diagnosis not present

## 2018-03-03 DIAGNOSIS — Z6827 Body mass index (BMI) 27.0-27.9, adult: Secondary | ICD-10-CM | POA: Diagnosis not present

## 2018-03-03 MED ORDER — EZETIMIBE 10 MG PO TABS: 5.0000 mg | ORAL_TABLET | Freq: Every day | ORAL | 3 refills | Status: DC

## 2018-03-03 NOTE — Telephone Encounter (Signed)
Notes recorded by Frederik Schmidt, RN on 03/03/2018 at 8:43 AM EST The patient has been notified of the result and verbalized understanding. All questions (if any) were answered. Frederik Schmidt, RN 03/03/2018 8:43 AM

## 2018-03-03 NOTE — Telephone Encounter (Signed)
-----   Message from [place], [place] sent at 03/02/2018  4:55 PM EST ----- Liver enzymes are mildly elevated but in ok range to continue statin and zetia as long as he is tolerating ok w/o any side effects (unsual muscle ages or joint pain). At this point, we can cut Zetia in half at 5 mg daily. Continue Crestor 40 mg.

## 2018-03-07 DIAGNOSIS — M545 Low back pain: Secondary | ICD-10-CM | POA: Diagnosis not present

## 2018-03-11 DIAGNOSIS — M545 Low back pain: Secondary | ICD-10-CM | POA: Diagnosis not present

## 2018-03-23 DIAGNOSIS — M545 Low back pain: Secondary | ICD-10-CM | POA: Diagnosis not present

## 2018-03-24 ENCOUNTER — Other Ambulatory Visit: Payer: Self-pay | Admitting: Cardiovascular Disease

## 2018-03-30 DIAGNOSIS — M545 Low back pain: Secondary | ICD-10-CM | POA: Diagnosis not present

## 2018-04-05 ENCOUNTER — Inpatient Hospital Stay: Payer: PPO | Attending: Oncology | Admitting: Oncology

## 2018-04-05 ENCOUNTER — Telehealth: Payer: Self-pay | Admitting: Oncology

## 2018-04-05 VITALS — BP 117/72 | HR 86 | Temp 97.5°F | Resp 18 | Ht 71.0 in | Wt 197.2 lb

## 2018-04-05 DIAGNOSIS — E785 Hyperlipidemia, unspecified: Secondary | ICD-10-CM | POA: Diagnosis not present

## 2018-04-05 DIAGNOSIS — Z8572 Personal history of non-Hodgkin lymphomas: Secondary | ICD-10-CM

## 2018-04-05 DIAGNOSIS — C493 Malignant neoplasm of connective and soft tissue of thorax: Secondary | ICD-10-CM | POA: Diagnosis not present

## 2018-04-05 NOTE — Progress Notes (Signed)
  [facility] OFFICE PROGRESS NOTE   Diagnosis: Non-Hodgkin's lymphoma  INTERVAL HISTORY:   Mr. Eschmann returns for a scheduled visit.  He feels the left neck lymph node has resolved.  He had another cold 2 weeks ago and completed a course of prednisone and azithromycin.  And malaise.  He feels better.  No other complaint.  Objective:  Vital signs in last 24 hours:  Blood pressure 117/72, pulse 86, temperature (!) 97.5 F (36.4 C), temperature source Oral, resp. rate 18, height 5\' 11"  (1.803 m), weight 197 lb 3.2 oz (89.4 kg), SpO2 99 %.    HEENT: Oropharynx without visible mass, neck without mass Lymphatics: No right cervical, supraclavicular, axillary, or inguinal nodes.  2 cm high left cervical node posterior to the carotid pulse and inferior to the angle of the jaw.  The lymph node is mobile and nontender. Resp: Lungs clear bilaterally Cardio: Regular rate and rhythm GI: No hepatosplenomegaly, no mass, nontender Vascular: No leg edema  Skin: Central anterior chest scar without evidence of recurrent tumor.   Medications: I have reviewed the patient's current medications.   Assessment/Plan: 1. Leiomyosarcoma of the anterior chest wall, status post an excisional biopsy on 11/11/2015 confirming a leiomyosarcoma ? Positive lateral surgical margin, 2 x 0.6 cm, up to 5 mitoses per 10 high-powered fields ? Reexcision 12/23/2015-no leiomyosarcoma, negative resection margins  2. 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 History of coronary artery disease/myocardial infarction-status post coronary artery bypass surgery in 2007, status post placement of a RCA stent May 2018  4. Hyperlipidemia     Disposition: He appears well.  The left cervical lymph node is unchanged compared to when I saw him in December.  This could be a reactive node related to recent upper respiratory infections or lymphoma.  The lymph node  could less likely be related to another malignancy.  He is comfortable with observation.  He will contact us if the lymph node enlarges.  He will return for a follow-up visit in 4 months.  We will consider referral for a biopsy if the lymph node persists or enlarges.   Betsy Coder, MD  04/05/2018  3:35 PM

## 2018-04-05 NOTE — Telephone Encounter (Signed)
Gave avs and calendar ° °

## 2018-04-07 DIAGNOSIS — M545 Low back pain: Secondary | ICD-10-CM | POA: Diagnosis not present

## 2018-04-12 DIAGNOSIS — M545 Low back pain: Secondary | ICD-10-CM | POA: Diagnosis not present

## 2018-04-19 DIAGNOSIS — M545 Low back pain: Secondary | ICD-10-CM | POA: Diagnosis not present

## 2018-04-27 DIAGNOSIS — M545 Low back pain: Secondary | ICD-10-CM | POA: Diagnosis not present

## 2018-05-20 ENCOUNTER — Other Ambulatory Visit: Payer: Self-pay | Admitting: Cardiovascular Disease

## 2018-08-22 DIAGNOSIS — Z974 Presence of external hearing-aid: Secondary | ICD-10-CM | POA: Diagnosis not present

## 2018-08-22 DIAGNOSIS — H903 Sensorineural hearing loss, bilateral: Secondary | ICD-10-CM | POA: Diagnosis not present

## 2018-08-22 DIAGNOSIS — T162XXA Foreign body in left ear, initial encounter: Secondary | ICD-10-CM | POA: Diagnosis not present

## 2018-08-22 DIAGNOSIS — J343 Hypertrophy of nasal turbinates: Secondary | ICD-10-CM | POA: Diagnosis not present

## 2018-09-08 DIAGNOSIS — J449 Chronic obstructive pulmonary disease, unspecified: Secondary | ICD-10-CM | POA: Diagnosis not present

## 2018-09-12 ENCOUNTER — Telehealth: Payer: Self-pay | Admitting: Oncology

## 2018-09-12 ENCOUNTER — Inpatient Hospital Stay: Payer: PPO | Attending: Oncology | Admitting: Oncology

## 2018-09-12 ENCOUNTER — Other Ambulatory Visit: Payer: Self-pay

## 2018-09-12 VITALS — BP 123/71 | HR 80 | Temp 98.2°F | Resp 18 | Ht 71.0 in | Wt 195.4 lb

## 2018-09-12 DIAGNOSIS — R590 Localized enlarged lymph nodes: Secondary | ICD-10-CM | POA: Diagnosis not present

## 2018-09-12 DIAGNOSIS — C8290 Follicular lymphoma, unspecified, unspecified site: Secondary | ICD-10-CM

## 2018-09-12 DIAGNOSIS — E785 Hyperlipidemia, unspecified: Secondary | ICD-10-CM | POA: Diagnosis not present

## 2018-09-12 DIAGNOSIS — I252 Old myocardial infarction: Secondary | ICD-10-CM | POA: Insufficient documentation

## 2018-09-12 DIAGNOSIS — Z8589 Personal history of malignant neoplasm of other organs and systems: Secondary | ICD-10-CM | POA: Insufficient documentation

## 2018-09-12 DIAGNOSIS — I2510 Atherosclerotic heart disease of native coronary artery without angina pectoris: Secondary | ICD-10-CM | POA: Diagnosis not present

## 2018-09-12 DIAGNOSIS — Z8572 Personal history of non-Hodgkin lymphomas: Secondary | ICD-10-CM | POA: Insufficient documentation

## 2018-09-12 NOTE — Progress Notes (Signed)
   [facility] OFFICE PROGRESS NOTE   Diagnosis: Non-Hodgkin's lymphoma, leiomyosarcoma  INTERVAL HISTORY:   Mr. Talmage Nap returns as scheduled.  He feels well.  No change in the palpable left neck lymph node.  Good appetite.  He recently saw Dr. Redmond Baseman for removal of a part of his left hearing aid.  A head and neck exam was unremarkable other than the hearing aid fragment in the left ear canal.  Objective:  Vital signs in last 24 hours:  Blood pressure 123/71, pulse 80, temperature 98.2 F (36.8 C), temperature source Oral, resp. rate 18, height 5\' 11"  (1.803 m), weight 195 lb 6.4 oz (88.6 kg), SpO2 99 %.    HEENT: Neck without mass Lymphatics: 2 cm high left cervical node superior and posterior to the carotid pulse and near the mandible.  No other cervical, supraclavicular, axillary, or inguinal nodes GI: No hepatosplenomegaly, nontender Vascular: No leg edema Skin: Anterior chest surgical scar without evidence of recurrent tumor     Medications: I have reviewed the patient's current medications.   Assessment/Plan:  1. Leiomyosarcoma of the anterior chest wall, status post an excisional biopsy on 11/11/2015 confirming a leiomyosarcoma ? Positive lateral surgical margin, 2 x 0.6 cm, up to 5 mitoses per 10 high-powered fields ? Reexcision 12/23/2015-no leiomyosarcoma, negative resection margins  2. 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 History of coronary artery disease/myocardial infarction-status post coronary artery bypass surgery in 2007, status post placement of a RCA stent May 2018  4. Hyperlipidemia  5.  Left high anterior cervical node first noted December 2019   Disposition: Mr. Casella appears well.  He is in clinical remission from the leiomyosarcoma.  There is a persistent high left anterior cervical node.  I suspect the lymph node is related to follicular lymphoma.  He has a remote smoking  history.  I will refer him to Dr. Redmond Baseman for a complete head and neck exam and to consider the indication for a needle biopsy.  He will return for an office visit in 6 months.  We will see him sooner pending the evaluation by Dr. Redmond Baseman.  Betsy Coder, MD  09/12/2018  4:16 PM  Betsy Coder, MD  09/12/2018  4:16 PM

## 2018-09-12 NOTE — Telephone Encounter (Signed)
Gave avs and calendar ° °

## 2018-09-26 ENCOUNTER — Telehealth: Payer: Self-pay | Admitting: *Deleted

## 2018-09-26 NOTE — Telephone Encounter (Signed)
Call to follow up on referral placed on 09/12/2018. Patient has not heard from Dr. Redmond Baseman office. Called office and was told they did not receive the referral. Faxed referral and last office note to [phone].

## 2018-09-29 DIAGNOSIS — Z125 Encounter for screening for malignant neoplasm of prostate: Secondary | ICD-10-CM | POA: Diagnosis not present

## 2018-09-29 DIAGNOSIS — E7849 Other hyperlipidemia: Secondary | ICD-10-CM | POA: Diagnosis not present

## 2018-09-29 DIAGNOSIS — Z23 Encounter for immunization: Secondary | ICD-10-CM | POA: Diagnosis not present

## 2018-09-30 DIAGNOSIS — N183 Chronic kidney disease, stage 3 (moderate): Secondary | ICD-10-CM | POA: Diagnosis not present

## 2018-09-30 DIAGNOSIS — R82998 Other abnormal findings in urine: Secondary | ICD-10-CM | POA: Diagnosis not present

## 2018-10-05 DIAGNOSIS — R590 Localized enlarged lymph nodes: Secondary | ICD-10-CM | POA: Diagnosis not present

## 2018-10-06 DIAGNOSIS — I2510 Atherosclerotic heart disease of native coronary artery without angina pectoris: Secondary | ICD-10-CM | POA: Diagnosis not present

## 2018-10-06 DIAGNOSIS — M545 Low back pain: Secondary | ICD-10-CM | POA: Diagnosis not present

## 2018-10-06 DIAGNOSIS — C499 Malignant neoplasm of connective and soft tissue, unspecified: Secondary | ICD-10-CM | POA: Diagnosis not present

## 2018-10-06 DIAGNOSIS — N183 Chronic kidney disease, stage 3 (moderate): Secondary | ICD-10-CM | POA: Diagnosis not present

## 2018-10-06 DIAGNOSIS — Z1331 Encounter for screening for depression: Secondary | ICD-10-CM | POA: Diagnosis not present

## 2018-10-06 DIAGNOSIS — I129 Hypertensive chronic kidney disease with stage 1 through stage 4 chronic kidney disease, or unspecified chronic kidney disease: Secondary | ICD-10-CM | POA: Diagnosis not present

## 2018-10-06 DIAGNOSIS — E785 Hyperlipidemia, unspecified: Secondary | ICD-10-CM | POA: Diagnosis not present

## 2018-10-06 DIAGNOSIS — Z0000 Encounter for general adult medical examination without abnormal findings: Secondary | ICD-10-CM | POA: Diagnosis not present

## 2018-10-06 DIAGNOSIS — J439 Emphysema, unspecified: Secondary | ICD-10-CM | POA: Diagnosis not present

## 2018-10-06 DIAGNOSIS — M797 Fibromyalgia: Secondary | ICD-10-CM | POA: Diagnosis not present

## 2018-10-06 DIAGNOSIS — C8290 Follicular lymphoma, unspecified, unspecified site: Secondary | ICD-10-CM | POA: Diagnosis not present

## 2018-10-24 ENCOUNTER — Other Ambulatory Visit: Payer: Self-pay | Admitting: Cardiology

## 2018-11-07 ENCOUNTER — Other Ambulatory Visit: Payer: Self-pay

## 2018-11-07 DIAGNOSIS — R6889 Other general symptoms and signs: Secondary | ICD-10-CM | POA: Diagnosis not present

## 2018-11-07 DIAGNOSIS — Z20822 Contact with and (suspected) exposure to covid-19: Secondary | ICD-10-CM

## 2018-11-08 ENCOUNTER — Telehealth: Payer: Self-pay | Admitting: Gastroenterology

## 2018-11-08 LAB — NOVEL CORONAVIRUS, NAA: SARS-CoV-2, NAA: NOT DETECTED

## 2018-11-08 NOTE — Telephone Encounter (Signed)
DOD November 01, 2018  Dr. Havery Moros, there is a referral for pt to schedule a colonoscopy.  Previous colonoscopy report from [facility] will be sent to you for review.

## 2018-11-10 ENCOUNTER — Telehealth: Payer: Self-pay | Admitting: Gastroenterology

## 2018-11-10 NOTE — Telephone Encounter (Signed)
Records received from Dr. Havery Moros ok to schedule Colonoscopy.

## 2018-11-12 ENCOUNTER — Other Ambulatory Visit: Payer: Self-pay | Admitting: Cardiovascular Disease

## 2018-11-14 ENCOUNTER — Encounter: Payer: Self-pay | Admitting: Gastroenterology

## 2018-11-20 NOTE — Progress Notes (Signed)
Chief Complaint  Patient presents with  . Follow-up    CAD    History of Present Illness: 73 yo male with history of CAD s/p CABG 2007, Non-hodgkins Lymphoma, HLD here today for cardiac follow up. He had a 4V CABG in 2007. Cath in 2010 showed occluded SVG to Diagonal and occluded SVG to PDA/PLA withpatent LIMA graft to the  LAD. There was diffuse disease in distal vessels felt best to be managed medically. 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 Echo April 2018 with LVEF=40-45% and hypokinesis of the anteroseptal wall and apex. Nuclear stress test April 2018 with possible ischemia. Cardiac cath 06/17/16 with patent LIMA to LAD, all other grafts known to be occluded. Severe stenosis in the proximal RCA treated with a drug eluting stent. He was seen in our office August 2019 by Lyda Jester, PA-C with c/o dyspnea with exertion. Cardiac monitor with sinus rhythm, rare PVCs, rare PACs. Nuclear stress test September 2019 without ischemia. Echo September 2019 with LVEF=40%, trivial MR. He was tried on Cozaar in October 2019 but did not tolerate due to dizziness and presumed hypotension.    He is here today for follow up. The patient denies any chest pain, dyspnea, palpitations, lower extremity edema, orthopnea, PND, dizziness, near syncope or syncope. He is exercising every day with no issues. Occasional resting dyspnea and resting right upper quadrant pain but neither is every brought on by exercise.   Primary Care Physician: [name], MD  Past Medical History:  Diagnosis Date  . Buttock pain   . CAD (coronary artery disease)    Last cath 2010 per Dr. Doreatha Lew with diffuse multivessel disease, small caliber vessels not felt to be suitable for PCI  . Chronic left hip pain   . Dizziness    1 episode   . Fibromyalgia   . GERD (gastroesophageal reflux disease)   . Head pain    "not chronic; I have myofasial tightening in my head that causes pain in my  skull" (06/17/2016)  . HLD (hyperlipidemia)   . Hypertension   . Myeloma ([place])    "found it in my chest when they did the excision"  . Myocardial infarction ([place]) 06/2005  . Non Hodgkin's lymphoma ([place]) dx'd 06/2005    Past Surgical History:  Procedure Laterality Date  . CARDIAC CATHETERIZATION  ~ 2008   "Dr. Doreatha Lew"  . CATARACT EXTRACTION W/ INTRAOCULAR LENS IMPLANT Right   . CORONARY ANGIOPLASTY WITH STENT PLACEMENT  06/17/2016  . CORONARY ARTERY BYPASS GRAFT  5/07   x5. LV dysfunction.   . CORONARY STENT INTERVENTION N/A 06/17/2016   Procedure: Coronary Stent Intervention;  Surgeon: Burnell Blanks, MD;  Location: [facility];  Service: Cardiovascular;  Laterality: N/A;  . LAPAROSCOPIC CHOLECYSTECTOMY    . LEFT HEART CATH AND CORS/GRAFTS ANGIOGRAPHY N/A 06/17/2016   Procedure: Left Heart Cath and Cors/Grafts Angiography;  Surgeon: Burnell Blanks, MD;  Location: [facility];  Service: Cardiovascular;  Laterality: N/A;  . MASS EXCISION N/A 11/11/2015   Procedure: EXCISION OF MID CHEST  MASS;  Surgeon: Judeth Horn, MD;  Location: [facility];  Service: General;  Laterality: N/A;  . MASS EXCISION N/A 12/23/2015   Procedure: REEXCISION OF CHEST WALL TUMOR SITE;  Surgeon: Judeth Horn, MD;  Location: [facility];  Service: General;  Laterality: N/A;  . POPLITEAL SYNOVIAL CYST EXCISION Right   . TONSILLECTOMY  Current Outpatient Medications  Medication Sig Dispense Refill  . cholecalciferol (VITAMIN D) 1000 units tablet Take 1,000 Units by mouth daily.    . clopidogrel (PLAVIX) 75 MG tablet TAKE 1 TABLET (75 MG TOTAL) BY MOUTH DAILY WITH BREAKFAST. 90 tablet 0  . Coenzyme Q10 300 MG CAPS Take 300 mg by mouth daily.    . cyclobenzaprine (FLEXERIL) 10 MG tablet Take 10 mg by mouth 2 (two) times daily.    Marland Kitchen ezetimibe (ZETIA) 10 MG tablet TAKE 1 TABLET BY MOUTH EVERY DAY 90 tablet 1  . L-Arginine 1000 MG TABS Take 1,000 mg by mouth 2  (two) times daily.    . Magnesium Gluconate 550 MG TABS Take 550 mg by mouth every Monday, Wednesday, and Friday.    . meloxicam (MOBIC) 15 MG tablet Take 15 mg by mouth daily as needed for pain.    . metoprolol succinate (TOPROL-XL) 25 MG 24 hr tablet TAKE 1 TABLET BY MOUTH DAILY. 90 tablet 3  . naltrexone (DEPADE) 50 MG tablet Take by mouth 2 (two) times daily.    . nitroGLYCERIN (NITROSTAT) 0.4 MG SL tablet Place 1 tablet (0.4 mg total) under the tongue every 5 (five) minutes as needed for chest pain. 25 tablet 3  . OVER THE COUNTER MEDICATION Take 240 mg by mouth daily. Called : CocoaVia    . Probiotic CAPS Take 1 capsule by mouth 2 (two) times daily.    . rosuvastatin (CRESTOR) 40 MG tablet TAKE 1 TABLET BY MOUTH EVERY DAY 90 tablet 3  . triamcinolone (NASACORT) 55 MCG/ACT AERO nasal inhaler Place 1 spray into the nose daily as needed (for allergies).      No current facility-administered medications for this visit.     Allergies  Allergen Reactions  . Niacin And Related Nausea And Vomiting  . Niaspan [Niacin Er] Nausea And Vomiting and Other (See Comments)    Stomach intolerance    Social History   Socioeconomic History  . Marital status: Married    Spouse name: [name]  . Number of children: 2  . Years of education: college  . Highest education level: Not on file  Occupational History  . Occupation: Camera operator  Social Needs  . Financial resource strain: Not on file  . Food insecurity    Worry: Not on file    Inability: Not on file  . Transportation needs    Medical: Not on file    Non-medical: Not on file  Tobacco Use  . Smoking status: Former Smoker    Packs/day: 1.50    Years: 20.00    Pack years: 30.00    Types: Cigarettes  . Smokeless tobacco: Never Used  . Tobacco comment: "quit 06/2005"  Substance and Sexual Activity  . Alcohol use: No    Alcohol/week: 0.0 standard drinks  . Drug use: No  . Sexual activity: Not on file  Lifestyle  . Physical  activity    Days per week: Not on file    Minutes per session: Not on file  . Stress: Not on file  Relationships  . Social Herbalist on phone: Not on file    Gets together: Not on file    Attends religious service: Not on file    Active member of club or organization: Not on file    Attends meetings of clubs or organizations: Not on file    Relationship status: Not on file  . Intimate partner violence    Fear of  current or ex partner: Not on file    Emotionally abused: Not on file    Physically abused: Not on file    Forced sexual activity: Not on file  Other Topics Concern  . Not on file  Social History Narrative   Married, 2 children.   Runs an executive recruiting company.    Left-handed.   Drinks a bottle of green tea.    Family History  Problem Relation Age of Onset  . Heart disease Father   . Heart attack Father   . Dementia Mother     Review of Systems:  As stated in the HPI and otherwise negative.   BP 122/76   Pulse 88   Ht 5\' 11"  (1.803 m)   Wt 193 lb 12.8 oz (87.9 kg)   SpO2 97%   BMI 27.03 kg/m   Physical Examination:  General: Well developed, well nourished, NAD  HEENT: OP clear, mucus membranes moist  SKIN: warm, dry. No rashes. Neuro: No focal deficits  Musculoskeletal: Muscle strength 5/5 all ext  Psychiatric: Mood and affect normal  Neck: No JVD, no carotid bruits, no thyromegaly, no lymphadenopathy.  Lungs:Clear bilaterally, no wheezes, rhonci, crackles Cardiovascular: Regular rate and rhythm. No murmurs, gallops or rubs. Abdomen:Soft. Bowel sounds present. Non-tender.  Extremities: No lower extremity edema. Pulses are 2 + in the bilateral DP/PT.  Echo September 2019: - Left ventricle: The cavity size was normal. Wall thickness was   increased in a pattern of mild LVH. Systolic function was   moderately reduced. The estimated ejection fraction was in the   range of 35% to 40%. Anteroseptal, inferoseptal, and anterior    hypokinesis. Doppler parameters are consistent with abnormal left   ventricular relaxation (grade 1 diastolic dysfunction). - Aortic valve: There was no stenosis. - Mitral valve: Mildly calcified annulus. There was trivial   regurgitation. - Right ventricle: The cavity size was normal. Systolic function   was mildly reduced. - Tricuspid valve: Peak RV-RA gradient (S): 27 mm Hg. - Pulmonary arteries: PA peak pressure: 30 mm Hg (S). - Inferior vena cava: The vessel was normal in size. The   respirophasic diameter changes were in the normal range (>= 50%),   consistent with normal central venous pressure.  Impressions:  - Normal LV size with mild LV hypertrophy. EF 35-40% with   anteroseptal, inferoseptal, and anterior hypokinesis. Normal RV   size with mildly decreased systolic function. No significant   valvular abnormalities.   EKG:  EKG is ordered today. The ekg ordered today demonstrates  NSR  Recent Labs: 12/06/2017: BUN 23; Creatinine, Ser 1.34; Potassium 4.1; Sodium 138 02/28/2018: ALT 55   Lipid Panel Followed in primary care   Wt Readings from Last 3 Encounters:  11/21/18 193 lb 12.8 oz (87.9 kg)  09/12/18 195 lb 6.4 oz (88.6 kg)  04/05/18 197 lb 3.2 oz (89.4 kg)     Other studies Reviewed: Additional studies/ records that were reviewed today include: . Review of the above records demonstrates:    Assessment and Plan:   1. CAD without angina: Cardiac cath May 2018 with patent LIMA to LAD, severe disease in the RCA and moderately severe disease in the circumflex. A drug eluting stent was placed in the RCA. He does have moderately severe disease in the Circumflex that is unchanged. Nuclear stress test September 2019 with no ischemia. He has no chest pain. Will continue ASA, Plavix, statin and beta blocker.      2. Hyperlipidemia:Lipids followed in  primary care. Last LDL 47 in August 2020. Will continue statin.     3. Ischemic cardiomyopathy: LVEF=35-40% by echo  September 2019. He did not tolerate low dose Cozaar due to hypotension and dizziness. I suspect that he will not tolerate afterload reducing agents. Will continue beta blocker.   Current medicines are reviewed at length with the patient today.  The patient does not have concerns regarding medicines.  The following changes have been made:  no change  Labs/ tests ordered today include:   Orders Placed This Encounter  Procedures  . EKG 12-Lead    Disposition:   FU with me in 6  months  Signed, Lauree Chandler, MD 11/21/2018 8:33 AM    [facility] [address], [place], [place]  [address] Phone: [phone]; Fax: [phone]

## 2018-11-21 ENCOUNTER — Other Ambulatory Visit: Payer: Self-pay

## 2018-11-21 ENCOUNTER — Encounter: Payer: Self-pay | Admitting: Cardiovascular Disease

## 2018-11-21 ENCOUNTER — Ambulatory Visit: Payer: PPO | Admitting: Cardiovascular Disease

## 2018-11-21 VITALS — BP 122/76 | HR 88 | Ht 71.0 in | Wt 193.8 lb

## 2018-11-21 DIAGNOSIS — I25810 Atherosclerosis of coronary artery bypass graft(s) without angina pectoris: Secondary | ICD-10-CM

## 2018-11-21 DIAGNOSIS — I255 Ischemic cardiomyopathy: Secondary | ICD-10-CM

## 2018-11-21 DIAGNOSIS — E7800 Pure hypercholesterolemia, unspecified: Secondary | ICD-10-CM | POA: Diagnosis not present

## 2018-11-21 DIAGNOSIS — R1011 Right upper quadrant pain: Secondary | ICD-10-CM | POA: Diagnosis not present

## 2018-11-21 NOTE — Patient Instructions (Signed)
Medication Instructions:  No changes If you need a refill on your cardiac medications before your next appointment, please call your pharmacy.   Lab work: none If you have labs (blood work) drawn today and your tests are completely normal, you will receive your results only by: . MyChart Message (if you have MyChart) OR . A paper copy in the mail If you have any lab test that is abnormal or we need to change your treatment, we will call you to review the results.  Testing/Procedures: none  Follow-Up: At [facility], you and your health needs are our priority.  As part of our continuing mission to provide you with exceptional heart care, we have created designated Provider Care Teams.  These Care Teams include your primary Cardiologist (physician) and Advanced Practice Providers (APPs -  Physician Assistants and Nurse Practitioners) who all work together to provide you with the care you need, when you need it. You will need a follow up appointment in 6 months.  Please call our office 2 months in advance to schedule this appointment.  You may see Christopher McAlhany, MD or one of the following Advanced Practice Providers on your designated Care Team:   Brittainy Simmons, PA-C Dayna Dunn, PA-C . Michele Lenze, PA-C  Any Other Special Instructions Will Be Listed Below (If Applicable).    

## 2018-11-22 ENCOUNTER — Other Ambulatory Visit: Payer: Self-pay | Admitting: Internal Medicine

## 2018-11-22 DIAGNOSIS — R1011 Right upper quadrant pain: Secondary | ICD-10-CM

## 2018-11-30 ENCOUNTER — Ambulatory Visit
Admission: RE | Admit: 2018-11-30 | Discharge: 2018-11-30 | Disposition: A | Discharge status: Home / Self Care | Payer: PPO | Source: Ambulatory Visit | Attending: Internal Medicine | Admitting: Internal Medicine

## 2018-11-30 DIAGNOSIS — R1011 Right upper quadrant pain: Secondary | ICD-10-CM | POA: Diagnosis not present

## 2018-12-12 DIAGNOSIS — L821 Other seborrheic keratosis: Secondary | ICD-10-CM | POA: Diagnosis not present

## 2018-12-12 DIAGNOSIS — Z85828 Personal history of other malignant neoplasm of skin: Secondary | ICD-10-CM | POA: Diagnosis not present

## 2018-12-12 DIAGNOSIS — L218 Other seborrheic dermatitis: Secondary | ICD-10-CM | POA: Diagnosis not present

## 2018-12-12 DIAGNOSIS — L570 Actinic keratosis: Secondary | ICD-10-CM | POA: Diagnosis not present

## 2018-12-15 ENCOUNTER — Ambulatory Visit: Payer: PPO | Admitting: Gastroenterology

## 2018-12-15 ENCOUNTER — Telehealth: Payer: Self-pay | Admitting: General Surgery

## 2018-12-15 NOTE — Telephone Encounter (Signed)
No show letter sent.

## 2019-01-26 ENCOUNTER — Other Ambulatory Visit: Payer: Self-pay

## 2019-01-26 ENCOUNTER — Other Ambulatory Visit (INDEPENDENT_AMBULATORY_CARE_PROVIDER_SITE_OTHER): Payer: PPO

## 2019-01-26 ENCOUNTER — Ambulatory Visit (INDEPENDENT_AMBULATORY_CARE_PROVIDER_SITE_OTHER): Payer: PPO | Admitting: Gastroenterology

## 2019-01-26 ENCOUNTER — Encounter: Payer: Self-pay | Admitting: Gastroenterology

## 2019-01-26 VITALS — BP 104/60 | HR 92 | Temp 98.1°F | Ht 71.0 in | Wt 188.4 lb

## 2019-01-26 DIAGNOSIS — Z1211 Encounter for screening for malignant neoplasm of colon: Secondary | ICD-10-CM | POA: Diagnosis not present

## 2019-01-26 DIAGNOSIS — R748 Abnormal levels of other serum enzymes: Secondary | ICD-10-CM | POA: Diagnosis not present

## 2019-01-26 DIAGNOSIS — Z7902 Long term (current) use of antithrombotics/antiplatelets: Secondary | ICD-10-CM

## 2019-01-26 LAB — CREATININE KINASE MB: CK-MB: 5 ng/mL — ABNORMAL HIGH (ref 0.3–4.0)

## 2019-01-26 LAB — HEPATIC FUNCTION PANEL
ALT: 32 U/L (ref 0–53)
AST: 40 U/L — ABNORMAL HIGH (ref 0–37)
Albumin: 3.9 g/dL (ref 3.5–5.2)
Alkaline Phosphatase: 60 U/L (ref 39–117)
Bilirubin, Direct: 0.1 mg/dL (ref 0.0–0.3)
Total Bilirubin: 0.5 mg/dL (ref 0.2–1.2)
Total Protein: 6.7 g/dL (ref 6.0–8.3)

## 2019-01-26 NOTE — Patient Instructions (Signed)
Your provider has requested that you go to the basement level for lab work before leaving today. Press "B" on the elevator. The lab is located at the first door on the left as you exit the elevator.  It has been recommended to you by your physician that you have a(n) colonoscopy completed. Per your request, we did not schedule the procedure(s) today. Please contact our office at [phone] should you decide to have the procedure completed. You will be scheduled for a pre-visit and procedure at that time.

## 2019-01-26 NOTE — Progress Notes (Signed)
HPI :  73 y/o male with a history of CAD s/p CABG on Plavix, diverticulosis, GERD, history of lymphoma in 2007, referred by Dr. Bevelyn Buckles to discuss colonoscopy screening.   His last colonoscopy was performed in 2010 by Dr. Penelope Coop, he had no polyps at that time, only sigmoid diverticulosis.  He denies any problems with his bowels at all.  No diarrhea or constipation.  No blood in his stools.  He denies any abdominal pains.  No weight loss.  He is eating well, no reflux symptoms, no nausea or vomiting, no alarm symptoms.  He denies any family history of colon cancer.  He does have some low back pain in his buttock that sometimes radiates to his anus, he is had that extensively evaluated no clear cause.  He takes Flexeril for this and myofascial pain which helps.  He takes Mobic rarely.  He does take aspirin and Plavix on a daily basis for history of coronary artery disease.  He has a good exertional capacity.  He has occasional shortness of breath.  He has a history of ischemic cardiomyopathy with ejection fraction of 35 to 40% on his last echo in 2019.  He otherwise has a history of mild AST and ALT elevation.  Back in January he had an AST of 66 and ALT of 55.  Before that he had an ALT of 49 and 2019.  He does lift weights about 2 days a week, there was concern about skeletal muscle breakdown causing this.  He has not had follow-up LFTs or CK level.  I do not see any prior testing for hepatitis C.  He has had an ultrasound done in October which showed cholecystectomy state and otherwise normal liver.   Colonoscopy 01/28/09 - Dr. Anson Fret - diverticulosis sigmoid colon, otherwise normal exam  Past Medical History:  Diagnosis Date  . Buttock pain   . CAD (coronary artery disease)    Last cath 2010 per Dr. Doreatha Lew with diffuse multivessel disease, small caliber vessels not felt to be suitable for PCI  . Chronic left hip pain   . Diverticulosis of sigmoid colon 2010   colonoscopy -  [facility]  . Dizziness    1 episode   . Fibromyalgia   . GERD (gastroesophageal reflux disease)   . Head pain    "not chronic; I have myofasial tightening in my head that causes pain in my skull" (06/17/2016)  . HLD (hyperlipidemia)   . Hypertension   . Myeloma ([place])    "found it in my chest when they did the excision"  . Myocardial infarction ([place]) 06/2005  . Non Hodgkin's lymphoma ([place]) dx'd 06/2005     Past Surgical History:  Procedure Laterality Date  . CARDIAC CATHETERIZATION  ~ 2008   "Dr. Doreatha Lew"  . CATARACT EXTRACTION W/ INTRAOCULAR LENS IMPLANT Right   . colonscopy  2010  . CORONARY ANGIOPLASTY WITH STENT PLACEMENT  06/17/2016  . CORONARY ARTERY BYPASS GRAFT  5/07   x5. LV dysfunction.   . CORONARY STENT INTERVENTION N/A 06/17/2016   Procedure: Coronary Stent Intervention;  Surgeon: Burnell Blanks, MD;  Location: [facility];  Service: Cardiovascular;  Laterality: N/A;  . LAPAROSCOPIC CHOLECYSTECTOMY    . LEFT HEART CATH AND CORS/GRAFTS ANGIOGRAPHY N/A 06/17/2016   Procedure: Left Heart Cath and Cors/Grafts Angiography;  Surgeon: Burnell Blanks, MD;  Location: [facility];  Service: Cardiovascular;  Laterality: N/A;  . MASS EXCISION N/A 11/11/2015   Procedure:  EXCISION OF MID CHEST  MASS;  Surgeon: Judeth Horn, MD;  Location: [facility];  Service: General;  Laterality: N/A;  . MASS EXCISION N/A 12/23/2015   Procedure: REEXCISION OF CHEST WALL TUMOR SITE;  Surgeon: Judeth Horn, MD;  Location: [facility];  Service: General;  Laterality: N/A;  . POPLITEAL SYNOVIAL CYST EXCISION Right   . TONSILLECTOMY     Family History  Problem Relation Age of Onset  . Heart disease Father   . Heart attack Father   . Dementia Mother   . Cancer Paternal Grandfather        ? type   Social History   Tobacco Use  . Smoking status: Former Smoker    Packs/day: 1.50    Years: 20.00    Pack years: 30.00    Types: Cigarettes  .  Smokeless tobacco: Never Used  . Tobacco comment: "quit 06/2005"  Substance Use Topics  . Alcohol use: No    Alcohol/week: 0.0 standard drinks  . Drug use: No   Current Outpatient Medications  Medication Sig Dispense Refill  . cholecalciferol (VITAMIN D) 1000 units tablet Take 1,000 Units by mouth daily.    . clopidogrel (PLAVIX) 75 MG tablet TAKE 1 TABLET (75 MG TOTAL) BY MOUTH DAILY WITH BREAKFAST. 90 tablet 0  . Coenzyme Q10 300 MG CAPS Take 300 mg by mouth daily.    . cyclobenzaprine (FLEXERIL) 10 MG tablet Take 10 mg by mouth 2 (two) times daily.    Marland Kitchen ezetimibe (ZETIA) 10 MG tablet TAKE 1 TABLET BY MOUTH EVERY DAY 90 tablet 1  . L-Arginine 1000 MG TABS Take 1,000 mg by mouth 2 (two) times daily.    . Magnesium Gluconate 550 MG TABS Take 550 mg by mouth every Monday, Wednesday, and Friday.    . meloxicam (MOBIC) 15 MG tablet Take 15 mg by mouth as needed for pain.     . metoprolol succinate (TOPROL-XL) 25 MG 24 hr tablet TAKE 1 TABLET BY MOUTH DAILY. 90 tablet 3  . naltrexone (DEPADE) 50 MG tablet Take by mouth 2 (two) times daily.    . nitroGLYCERIN (NITROSTAT) 0.4 MG SL tablet Place 1 tablet (0.4 mg total) under the tongue every 5 (five) minutes as needed for chest pain. 25 tablet 3  . OVER THE COUNTER MEDICATION Take 240 mg by mouth daily. Called : CocoaVia    . rosuvastatin (CRESTOR) 40 MG tablet TAKE 1 TABLET BY MOUTH EVERY DAY 90 tablet 3  . triamcinolone (NASACORT) 55 MCG/ACT AERO nasal inhaler Place 1 spray into the nose daily as needed (for allergies).      No current facility-administered medications for this visit.   Allergies  Allergen Reactions  . Niacin And Related Nausea And Vomiting  . Niaspan [Niacin Er] Nausea And Vomiting and Other (See Comments)    Stomach intolerance     Review of Systems: All systems reviewed and negative except where noted in HPI.   Lab Results  Component Value Date   WBC 10.8 (H) 06/12/2017   HGB 13.0 06/12/2017   HCT 39.2  06/12/2017   MCV 88.3 06/12/2017   PLT 243 06/12/2017    Lab Results  Component Value Date   CREATININE 1.34 (H) 12/06/2017   BUN 23 12/06/2017   NA 138 12/06/2017   K 4.1 12/06/2017   CL 103 12/06/2017   CO2 22 12/06/2017      Physical Exam: BP 104/60   Pulse 92   Temp 98.1  F (36.7 C)   Ht 5\' 11"  (1.803 m)   Wt 188 lb 6 oz (85.4 kg)   BMI 26.27 kg/m  Constitutional: Pleasant,well-developed, male in no acute distress. HEENT: Normocephalic and atraumatic. Conjunctivae are normal. No scleral icterus. Neck supple.  Cardiovascular: Normal rate, regular rhythm.  Pulmonary/chest: Effort normal and breath sounds normal. No wheezing, rales or rhonchi. Abdominal: Soft, nondistended, nontender. There are no masses palpable. No hepatomegaly. Extremities: no edema Lymphadenopathy: No cervical adenopathy noted. Neurological: Alert and oriented to person place and time. Skin: Skin is warm and dry. No rashes noted. Psychiatric: Normal mood and affect. Behavior is normal.   ASSESSMENT AND PLAN: 73 year old male here for new patient assessment of the following:  Colon cancer screening / antiplatelet use - patient is average risk for colon cancer and is due for routine colon cancer screening.  We discussed options and he wants to proceed with an optical colonoscopy.  He has a good functional status, last echocardiogram noted, no changes in him clinically.  He does take aspirin and Plavix and will need to hold the Plavix for 5 days prior to colonoscopy to minimize risk of bleeding.  Following discussion of risks and benefits of the exam he did want to proceed with 1 more colonoscopy prior to stopping screening exams.  He is concerned about the Covid outbreak and wants to wait until the spring or summer timeframe to schedule this exam.  He will contact us when he is ready to schedule.  Elevated AST and ALT - this very well could be due to muscle breakdown in the setting of lifting weights.  Will repeat LFTs along with CK level to ensure okay, will also screen for viral hepatitis to make sure normal. He agreed.  Woodson Cellar, MD [facility]  CC: [name], MD

## 2019-01-27 LAB — HEPATITIS C ANTIBODY
Hepatitis C Ab: NONREACTIVE
SIGNAL TO CUT-OFF: 0.01 (ref <1.00)

## 2019-01-27 LAB — HEPATITIS B SURFACE ANTIGEN: Hepatitis B Surface Ag: NONREACTIVE

## 2019-02-14 ENCOUNTER — Other Ambulatory Visit: Payer: Self-pay

## 2019-02-14 ENCOUNTER — Other Ambulatory Visit: Payer: Self-pay | Admitting: Cardiovascular Disease

## 2019-03-07 ENCOUNTER — Encounter: Payer: Self-pay | Admitting: Oncology

## 2019-03-08 ENCOUNTER — Telehealth: Payer: Self-pay

## 2019-03-08 NOTE — Telephone Encounter (Signed)
Returned TC to patient in regard to his Non urgent medical question about his appointments in February. I let him know that he has an appointment with Dr Benay Spice Monday 03/13/19 @ 11:30am Patient verbalized understanding. No further problems or concerns at this time.

## 2019-03-13 ENCOUNTER — Inpatient Hospital Stay: Payer: PPO | Attending: Oncology | Admitting: Oncology

## 2019-03-13 ENCOUNTER — Other Ambulatory Visit: Payer: Self-pay

## 2019-03-13 VITALS — BP 119/70 | HR 86 | Temp 98.2°F | Resp 17 | Wt 192.6 lb

## 2019-03-13 DIAGNOSIS — Z8572 Personal history of non-Hodgkin lymphomas: Secondary | ICD-10-CM | POA: Insufficient documentation

## 2019-03-13 DIAGNOSIS — C8290 Follicular lymphoma, unspecified, unspecified site: Secondary | ICD-10-CM

## 2019-03-13 DIAGNOSIS — Z8589 Personal history of malignant neoplasm of other organs and systems: Secondary | ICD-10-CM | POA: Diagnosis not present

## 2019-03-13 DIAGNOSIS — E785 Hyperlipidemia, unspecified: Secondary | ICD-10-CM | POA: Insufficient documentation

## 2019-03-13 DIAGNOSIS — I252 Old myocardial infarction: Secondary | ICD-10-CM | POA: Insufficient documentation

## 2019-03-13 DIAGNOSIS — R221 Localized swelling, mass and lump, neck: Secondary | ICD-10-CM | POA: Insufficient documentation

## 2019-03-13 DIAGNOSIS — I2510 Atherosclerotic heart disease of native coronary artery without angina pectoris: Secondary | ICD-10-CM | POA: Diagnosis not present

## 2019-03-13 NOTE — Progress Notes (Signed)
  [facility] OFFICE PROGRESS NOTE   Diagnosis: Non-Hodgkin's lymphoma, leiomyosarcoma  INTERVAL HISTORY:    Joseph Soto returns for a scheduled visit.  He saw Dr. Redmond Baseman in August 2020.  No abnormality was detected on head and neck exam.  The area of concern appeared to be the submandibular gland.  There was also a prominent carotid bulb.  He reports that lymph node has decreased in size.  He cannot palpate a lymph node at present.  He feels well.  No anorexia or fever, or night sweats.  No other palpable lymph nodes.  Objective:  Vital signs in last 24 hours:  Blood pressure 119/70, pulse 86, temperature 98.2 F (36.8 C), temperature source Temporal, resp. rate 17, weight 192 lb 9.6 oz (87.4 kg), SpO2 100 %.    HEENT: Neck without mass. Lymphatics: There is a 2 cm mobile fullness overlying the superior and lateral aspect of the carotid pulse (prominent carotid bulb?).  No other cervical, supraclavicular, axillary, or inguinal nodes GI: No hepatosplenomegaly, no mass, nontender Vascular: No leg edema Skin: Anterior chest scar without evidence of recurrent tumor   Lab Results:  Lab Results  Component Value Date   WBC 10.8 (H) 06/12/2017   HGB 13.0 06/12/2017   HCT 39.2 06/12/2017   MCV 88.3 06/12/2017   PLT 243 06/12/2017   NEUTROABS 8.4 (H) 12/20/2015    CMP  Lab Results  Component Value Date   NA 138 12/06/2017   K 4.1 12/06/2017   CL 103 12/06/2017   CO2 22 12/06/2017   GLUCOSE 99 12/06/2017   BUN 23 12/06/2017   CREATININE 1.34 (H) 12/06/2017   CALCIUM 8.7 12/06/2017   PROT 6.7 01/26/2019   ALBUMIN 3.9 01/26/2019   AST 40 (H) 01/26/2019   ALT 32 01/26/2019   ALKPHOS 60 01/26/2019   BILITOT 0.5 01/26/2019   GFRNONAA 52 (L) 12/06/2017   GFRAA 60 12/06/2017    No results found for: CEA1  Lab Results  Component Value Date   INR 1.0 06/09/2016    Imaging:  No results found.  Medications: I have reviewed the patient's current  medications.   Assessment/Plan: 1. Leiomyosarcoma of the anterior chest wall, status post an excisional biopsy on 11/11/2015 confirming a leiomyosarcoma ? Positive lateral surgical margin, 2 x 0.6 cm, up to 5 mitoses per 10 high-powered fields ? Reexcision 12/23/2015-no leiomyosarcoma, negative resection margins  2. 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 History of coronary artery disease/myocardial infarction-status post coronary artery bypass surgery in 2007, status post placement of a RCA stent May 2018  4. Hyperlipidemia  5.  Left high anterior cervical node first noted December 2019     Disposition: Joseph Soto appears well.  He is in remission from the leiomyosarcoma.  There is a stable rounded fullness at the high left neck, potentially related to a stable lymph node versus a prominent carotid bulb.  He will call if this area enlarges.  He will return for an office visit in 9 months.  He is registered to receive the COVID-19 vaccine.  Betsy Coder, MD  03/13/2019  2:45 PM

## 2019-03-15 ENCOUNTER — Telehealth: Payer: Self-pay | Admitting: Oncology

## 2019-03-15 NOTE — Telephone Encounter (Signed)
Scheduled per los. Called and left msg. Mailed pirntout  °

## 2019-03-19 ENCOUNTER — Ambulatory Visit: Payer: PPO | Attending: Internal Medicine

## 2019-03-19 DIAGNOSIS — Z23 Encounter for immunization: Secondary | ICD-10-CM | POA: Insufficient documentation

## 2019-03-19 NOTE — Progress Notes (Signed)
   Covid-19 Vaccination Clinic  Name:  LETICIA OSTHOFF    MRN: [id number] DOB: Jun 11, 1945  03/19/2019  Mr. Glazer was observed post Covid-19 immunization for 15 minutes without incidence. He was provided with Vaccine Information Sheet and instruction to access the V-Safe system.   Mr. Umbach was instructed to call 911 with any severe reactions post vaccine: Marland Kitchen Difficulty breathing  . Swelling of your face and throat  . A fast heartbeat  . A bad rash all over your body  . Dizziness and weakness    Immunizations Administered    Name Date Dose VIS Date Route   Pfizer COVID-19 Vaccine 03/19/2019  2:38 PM 0.3 mL 01/20/2019 Intramuscular   Manufacturer: [place]   Lot: [id number]   [place]: [id number]

## 2019-03-24 ENCOUNTER — Other Ambulatory Visit: Payer: Self-pay | Admitting: Cardiovascular Disease

## 2019-04-03 ENCOUNTER — Ambulatory Visit: Payer: PPO

## 2019-04-13 ENCOUNTER — Ambulatory Visit: Payer: PPO | Attending: Internal Medicine

## 2019-04-13 ENCOUNTER — Other Ambulatory Visit: Payer: Self-pay | Admitting: Cardiology

## 2019-04-13 DIAGNOSIS — Z23 Encounter for immunization: Secondary | ICD-10-CM | POA: Insufficient documentation

## 2019-04-13 DIAGNOSIS — M792 Neuralgia and neuritis, unspecified: Secondary | ICD-10-CM | POA: Diagnosis not present

## 2019-04-13 DIAGNOSIS — G8929 Other chronic pain: Secondary | ICD-10-CM | POA: Diagnosis not present

## 2019-04-13 DIAGNOSIS — R519 Headache, unspecified: Secondary | ICD-10-CM | POA: Diagnosis not present

## 2019-04-13 DIAGNOSIS — I2510 Atherosclerotic heart disease of native coronary artery without angina pectoris: Secondary | ICD-10-CM | POA: Diagnosis not present

## 2019-04-13 NOTE — Progress Notes (Signed)
   Covid-19 Vaccination Clinic  Name:  Joseph Soto    MRN: [id number] DOB: [birth year]  04/13/2019  Mr. Joseph Soto was observed post Covid-19 immunization for 15 minutes without incident. He was provided with Vaccine Information Sheet and instruction to access the V-Safe system.   Mr. Joseph Soto was instructed to call 911 with any severe reactions post vaccine: Marland Kitchen Difficulty breathing  . Swelling of face and throat  . A fast heartbeat  . A bad rash all over body  . Dizziness and weakness   Immunizations Administered    Name Date Dose VIS Date Route   Pfizer COVID-19 Vaccine 04/13/2019  9:27 AM 0.3 mL 01/20/2019 Intramuscular   Manufacturer: [place]   Lot: [id number]   [place]: [id number]

## 2019-04-25 DIAGNOSIS — T162XXA Foreign body in left ear, initial encounter: Secondary | ICD-10-CM | POA: Diagnosis not present

## 2019-05-01 ENCOUNTER — Telehealth: Payer: Self-pay | Admitting: Neurology

## 2019-05-01 NOTE — Telephone Encounter (Signed)
Ok to see Dr. Ahern 

## 2019-05-01 NOTE — Telephone Encounter (Signed)
Patient has been referred back to [place] for Presistent headache. He has seen Dr. Krista Blue in the past (2017), but is requesting to switch his care over to Dr. Jaynee Eagles. Would you both be ok w/ this?

## 2019-05-01 NOTE — Telephone Encounter (Signed)
That's fine. thanks

## 2019-05-12 ENCOUNTER — Other Ambulatory Visit: Payer: Self-pay | Admitting: Cardiovascular Disease

## 2019-05-22 ENCOUNTER — Ambulatory Visit: Payer: PPO | Admitting: Cardiovascular Disease

## 2019-06-01 ENCOUNTER — Institutional Professional Consult (permissible substitution): Payer: PPO | Admitting: Neurology

## 2019-06-05 NOTE — Progress Notes (Addendum)
[id number]    Provider:  Dr Jaynee Eagles Requesting Provider: [name], MD Primary Care Provider:  [name], MD  CC:  Headaches and back pain  HPI:  Joseph Soto is a 74 y.o. male here as requested by [name], MD for headache.  Past medical history non-Hodgkin's lymphoma, MI, hypertension, hyperlipidemia, head pain, fibromyalgia, dizziness, coronary artery disease, chronic left hip pain and buttock pain.  I reviewed Dr. Buel Ream notes, for the last 10 to 15 years he has had 3 areas of persistent pain, he is very active, exercises regularly including weightlifting with a trainer, plays golf, works in the yard, nothing slows him down, pain really stops him from doing anything other than if his head pain is flared or severe, no limited range of motion and no diffuse muscle or joint pain, history of Ortho and rheumatologic work-up in 2016 without significant improvements or findings.  He complains of chronic head pain, diagnosed with myofascial pain syndrome after negative allergist work-up x2 and CT of the head 2014, described as head tightening, diagnosed by a doctor in [place], takes Flexeril 10 mg twice daily, naltrexone twice a day with relief for couple years, pain at baseline is 2 out of 3 but some days is incapacitating and can be associated with nausea and lightheadedness, denies neck pain or stiffness, could be described as headache on top of the head where the pain is.  He had fib[facility]algia [facility] test at [facility] that was apparently normal, a diagnosis he has been using for years.  I also reviewed Dr. Rhea Belton notes, he reported dizziness, head pain that started many years both ago with constant pain.  Per Dr. Krista Blue, over the years he was treated with pelvic floor muscle treatment, acupuncture, chiropractic, massage therapist, dry needling with limited help, he has been taking Flexeril twice a day, gabapentin 2 tablets twice a day, complains of side  effects and drowsiness, Flexeril, metoprolol, Zofran.  Her neurologic exam was completely normal, she performed an MRI of the lumbar spine and an EMG nerve conduction study, for his chronic tension headaches ESR, CRP, TSH and she tried Cymbalta.  10 to 15 years of chronic head pain pressure on top of the head with significant prior work-up, takes naltrexone and Flexeril daily for myofascial pain with dulling of pain but never complete relief, occasionally with flares with dizziness and nausea, no neck pain, he has been to pain management, [facility] clinic, rheumatology, orthopedics, and neurology.  He is also on meloxicam, Toprol. It starts on the parieto-occipital edges bilaterally and skin becomes very sensitive to touch, it is there all the time even right now, his ears feel like they are ready to pop, hasn't changed stable, not positional or exertional, no vision changes or other red flags. It can be very severe, usually 3-4/10 but can be 7-8/10. No light or sound sensitivity, not pulsating or pounding or throbbing, steady pressure. Nothing makes it better and nothing makes it worse. Unclear what triggers it to become worse, never had a history of migraines, no FHx of migraines. More pressure on the sides of the head and he can get nauseated. Continous.   He has significant pain, he has a point in his buttocks and it migrates across the back to the other side, better in the morning. He went to a pain clinic and they did injections and he has been worked up, MRI lumbar spine, he is interested in injections ito the spine for all his arthritis and I  will send him to Dr. Maryjean Ka for evaluation of medial branch blocks, ESI and or facet blocks. Ongoing for 25 years. Also Dr. Lovenia Shuck can consider Piriformis syndrome. Otherwise neurology can't help much for chronic pain management.   Reviewed notes, labs and imaging from outside physicians, which showed:  I reviewed CT images of the head from December 2014 and  agree with the following:IMPRESSION: No acute intracranial process.  Involutional changes. Mild white matter changes suggest chronic small vessel ischemic disease.  Liver function tests December 2020 showed elevated AST 40 otherwise normal, hep B surface antigen, hep C antibody nonreactive.  Sleep study in 2015 showed prolonged hypoxia, at that time he started a nasal cannula oxygen nightly, repeat study in February 2019 did not show significant nocturnal desaturations.  Review of Systems: Patient complains of symptoms per HPI as well as the following symptoms headaches, chronic buttocks pain. Pertinent negatives and positives per HPI. All others negative.   Social History   Socioeconomic History  . Marital status: Married    Spouse name: [name]  . Number of children: 2  . Years of education: college  . Highest education level: Not on file  Occupational History  . Occupation: retired  Tobacco Use  . Smoking status: Former Smoker    Packs/day: 1.50    Years: 20.00    Pack years: 30.00    Types: Cigarettes  . Smokeless tobacco: Never Used  . Tobacco comment: "quit 06/2005"  Substance and Sexual Activity  . Alcohol use: No    Alcohol/week: 0.0 standard drinks  . Drug use: No  . Sexual activity: Not on file  Other Topics Concern  . Not on file  Social History Narrative   Married, 2 children.   Runs an executive recruiting company.    Left-handed.   Caffeine: iced tea, green tea; tea daily    Social Determinants of Health   Financial Resource Strain:   . Difficulty of Paying Living Expenses:   Food Insecurity:   . Worried About Charity fundraiser in the Last Year:   . Arboriculturist in the Last Year:   Transportation Needs:   . Film/video editor (Medical):   Marland Kitchen Lack of Transportation (Non-Medical):   Physical Activity:   . Days of Exercise per Week:   . Minutes of Exercise per Session:   Stress:   . Feeling of Stress :   Social Connections:   . Frequency  of Communication with Friends and Family:   . Frequency of Social Gatherings with Friends and Family:   . Attends Religious Services:   . Active Member of Clubs or Organizations:   . Attends Archivist Meetings:   Marland Kitchen Marital Status:   Intimate Partner Violence:   . Fear of Current or Ex-Partner:   . Emotionally Abused:   Marland Kitchen Physically Abused:   . Sexually Abused:     Family History  Problem Relation Age of Onset  . Heart disease Father   . Heart attack Father   . Congestive Heart Failure Father   . Dementia Mother   . Cancer Paternal Grandfather        ? type    Past Medical History:  Diagnosis Date  . Allergic rhinitis    grass, dust  . Buttock pain   . CAD (coronary artery disease)    Last cath 2010 per Dr. Doreatha Lew with diffuse multivessel disease, small caliber vessels not felt to be suitable for PCI  . Chronic left  hip pain   . Colon polyps    pt says a colonoscopy did not confirm this, said there was nothing there  . Diverticulosis of sigmoid colon 2010   colonoscopy - [facility]  . Dizziness    1 episode   . Fibromyalgia   . GERD (gastroesophageal reflux disease)   . Head pain    "not chronic; I have myofasial tightening in my head that causes pain in my skull" (06/17/2016)  . HLD (hyperlipidemia)   . Hypertension   . Leiomyosarcoma of chest wall ([place]) 11/2015   surgical excision alone recommended (per notes from Dr. Shon Baton office)  . Myeloma ([place])    "found it in my chest when they did the excision"  . Myocardial infarction ([place]) 06/2005  . Non Hodgkin's lymphoma ([place]) dx'd 06/2005    Patient Active Problem List   Diagnosis Date Noted  . Chest pain 06/12/2017  . ARF (acute renal failure) ([place]) 06/12/2017  . Unstable angina ([place])   . Leiomyosarcoma of chest wall ([place]) 12/19/2015  . Left low back pain 12/02/2015  . Chronic headache 12/02/2015  . Dyspnea on exertion 10/30/2013  . Positional vertigo 01/23/2013  . Nodular lymphoma, unspecified  site, extranodal and solid organ sites 06/19/2011  . CAD (coronary artery disease) 08/21/2010  . Ischemic cardiomyopathy 08/21/2010  . Hyperlipidemia 08/21/2010    Past Surgical History:  Procedure Laterality Date  . BALLOON SINUPLASTY  2014  . CARDIAC CATHETERIZATION  ~ 2008   "Dr. Doreatha Lew"  . CATARACT EXTRACTION W/ INTRAOCULAR LENS IMPLANT Right   . colonscopy  2010  . CORONARY ANGIOPLASTY WITH STENT PLACEMENT  06/17/2016  . CORONARY ARTERY BYPASS GRAFT  5/07   x5. LV dysfunction.   . CORONARY STENT INTERVENTION N/A 06/17/2016   Procedure: Coronary Stent Intervention;  Surgeon: Burnell Blanks, MD;  Location: [facility];  Service: Cardiovascular;  Laterality: N/A;  . epidural steroid injections    . LAPAROSCOPIC CHOLECYSTECTOMY  1996  . LEFT HEART CATH AND CORS/GRAFTS ANGIOGRAPHY N/A 06/17/2016   Procedure: Left Heart Cath and Cors/Grafts Angiography;  Surgeon: Burnell Blanks, MD;  Location: [facility];  Service: Cardiovascular;  Laterality: N/A;  . MASS EXCISION N/A 11/11/2015   Procedure: EXCISION OF MID CHEST  MASS;  Surgeon: Judeth Horn, MD;  Location: [facility];  Service: General;  Laterality: N/A;  . MASS EXCISION N/A 12/23/2015   Procedure: REEXCISION OF CHEST WALL TUMOR SITE;  Surgeon: Judeth Horn, MD;  Location: [facility];  Service: General;  Laterality: N/A;  . POPLITEAL SYNOVIAL CYST EXCISION Right   . TONSILLECTOMY  1952   & Adenoidectomy    Current Outpatient Medications  Medication Sig Dispense Refill  . cholecalciferol (VITAMIN D) 1000 units tablet Take 1,000 Units by mouth in the morning and at bedtime.     . clopidogrel (PLAVIX) 75 MG tablet TAKE 1 TABLET (75 MG TOTAL) BY MOUTH DAILY WITH BREAKFAST. (Patient taking differently: Take 75 mg by mouth daily with breakfast. ) 90 tablet 3  . Coenzyme Q10 300 MG CAPS Take 300 mg by mouth in the morning and at bedtime.     . cyclobenzaprine (FLEXERIL) 10 MG  tablet Take 10 mg by mouth 2 (two) times daily.    Marland Kitchen ezetimibe (ZETIA) 10 MG tablet TAKE 1 TABLET BY MOUTH EVERY DAY (Patient taking differently: Take 10 mg by mouth daily. ) 90 tablet 2  . L-Arginine 1000 MG TABS Take 1,000 mg by  mouth 2 (two) times daily.    . metoprolol succinate (TOPROL-XL) 25 MG 24 hr tablet TAKE 1 TABLET BY MOUTH DAILY. (Patient taking differently: Take 12.5 mg by mouth in the morning and at bedtime. ) 90 tablet 2  . naltrexone (DEPADE) 50 MG tablet Take 50 mg by mouth 2 (two) times daily.     . nitroGLYCERIN (NITROSTAT) 0.4 MG SL tablet Place 1 tablet (0.4 mg total) under the tongue every 5 (five) minutes as needed for chest pain. 25 tablet 3  . OVER THE COUNTER MEDICATION Take 450 mg by mouth in the morning and at bedtime. Cocoa    . rosuvastatin (CRESTOR) 40 MG tablet TAKE 1 TABLET BY MOUTH EVERY DAY (Patient taking differently: Take 40 mg by mouth daily. ) 90 tablet 1  . UNABLE TO FIND Take 2,000 mg by mouth in the morning and at bedtime. L-Arginine & L-Citriline     . aspirin EC 81 MG tablet Take 81 mg by mouth daily.    . cyanocobalamin 2000 MCG tablet Take 2,000 mcg by mouth daily.    . Fremanezumab-vfrm (AJOVY) 225 MG/1.5ML SOAJ Inject 225 mg into the skin every 30 (thirty) days. 3 pen 0  . TURMERIC CURCUMIN PO Take 1,500 mg by mouth in the morning and at bedtime.     No current facility-administered medications for this visit.    Allergies as of 06/06/2019 - Review Complete 06/06/2019  Allergen Reaction Noted  . Niacin and related Nausea And Vomiting 12/23/2015  . Niaspan [niacin er] Nausea And Vomiting and Other (See Comments) 08/21/2010    Vitals: BP (!) 141/77 (BP Location: Right Arm, Patient Position: Sitting)   Pulse 76   Temp (!) 97.5 F (36.4 C) Comment: taken at front  Ht _0  (1.803 m)   Wt 193 lb (87.5 kg)   BMI 26.92 kg/m  Last Weight:  Wt Readings from Last 1 Encounters:  06/07/19 193 lb (87.5 kg)   Last Height:   Ht Readings from  Last 1 Encounters:  06/07/19 _1  (1.803 m)     Physical exam: Exam: Gen: NAD, conversant, well nourised, thin , well groomed                     CV: RRR, no MRG. No Carotid Bruits. No peripheral edema, warm, nontender Eyes: Conjunctivae clear without exudates or hemorrhage  Neuro: Detailed Neurologic Exam  Speech:    Speech is normal; fluent and spontaneous with normal comprehension.  Cognition:    The patient is oriented to person, place, and time;     recent and remote memory intact;     language fluent;     normal attention, concentration,     fund of knowledge Cranial Nerves:    The pupils are equal, round, and reactive to light. Could not visualize fundi due to small pupils. . Visual fields are full to finger confrontation. Extraocular movements are intact. Trigeminal sensation is intact and the muscles of mastication are normal. The face is symmetric. The palate elevates in the midline. Hearing intact. Voice is normal. Shoulder shrug is normal. The tongue has normal motion without fasciculations.   Coordination:    No dysmetria   Gait:    normal native gait  Motor Observation:    No asymmetry, no atrophy, and no involuntary movements noted. Tone:    Normal muscle tone.    Posture:    Posture is normal. normal erect    Strength:  Strength is V/V in the upper and lower limbs.      Sensation: intact to LT     Reflex Exam:  DTR's:    Deep tendon reflexes in the upper and lower extremities are normal bilaterally.   Toes:    The toes are downgoing bilaterally.   Clonus:    Clonus is absent.    Assessment/Plan:  Patient with 15 years chronic headaches with extensive workup. Will need to try something a little different, start Ajovy and try Botox as well. Not really migrainous however but could be tranformed migraines. Could also consider occipital irritation.   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 Laurence Spates  or Dr. Maryjean Ka for evaluation of Occipital RFA or other interventions.and for evaluation for injections into the piriformis muscle for buttocks pain vs ESI for lumbosacral radic.  - Ajovy - gave 3 samples see if this helps - Will start botox for migraines and put extra in the parieto-occipital areas - May also be occipital neuropathy causing the pain and sensitivity in the head, may consider truing RFA with Dr. Maryjean Ka if above does not work - He has significant pain 25 years  in  a point in his buttocks and it migrates across the back to the other side, better in the morning. He went to a pain clinic and they did bursa injections and he has been worked up - burs injections didn't work, never had other pain interventions. I will send him to Dr. Maryjean Ka for evaluation for medial branch blocks, ESI and or facet blocks.  Also Dr. Lovenia Shuck can consider Piriformis syndrome. Otherwise neurology can't help much for chronic pain management.   Meds ordered this encounter  Medications  . Fremanezumab-vfrm (AJOVY) 225 MG/1.5ML SOAJ    Sig: Inject 225 mg into the skin every 30 (thirty) days.    Dispense:  3 pen    Refill:  0   Discussed: To prevent or relieve headaches, try the following: Cool Compress. Lie down and place a cool compress on your head.  Avoid headache triggers. If certain foods or odors seem to have triggered your migraines in the past, avoid them. A headache diary might help you identify triggers.  Include physical activity in your daily routine. Try a daily walk or other moderate aerobic exercise.  Manage stress. Find healthy ways to cope with the stressors, such as delegating tasks on your to-do list.  Practice relaxation techniques. Try deep breathing, yoga, massage and visualization.  Eat regularly. Eating regularly scheduled meals and maintaining a healthy diet might help prevent headaches. Also, drink plenty of fluids.  Follow a regular sleep schedule. Sleep deprivation might  contribute to headaches Consider biofeedback. With this mind-body technique, you learn to control certain bodily functions -- such as muscle tension, heart rate and blood pressure -- to prevent headaches or reduce headache pain.    Proceed to emergency room if you experience new or worsening symptoms or symptoms do not resolve, if you have new neurologic symptoms or if headache is severe, or for any concerning symptom.   Provided education and documentation from American headache Society toolbox including articles on: chronic migraine medication overuse headache, chronic migraines, prevention of migraines, behavioral and other nonpharmacologic treatments for headache.   Cc: [name], MD  Sarina Ill, MD  [facility] [address] [place], [place] [address]  Phone [phone] Fax [phone]  I spent 60 minutes of face-to-face and  non-face-to-face time with patient on the  1. Chronic migraine without aura, with intractable migraine, so stated, with status migrainosus   2. Chronic buttock pain   3. Lumbar degenerative disc disease    diagnosis.  This included previsit chart review, lab review, study review, order entry, electronic health record documentation, patient education on the different diagnostic and therapeutic options, counseling and coordination of care, risks and benefits of management, compliance, or risk factor reduction

## 2019-06-06 ENCOUNTER — Ambulatory Visit: Payer: PPO | Admitting: Neurology

## 2019-06-06 ENCOUNTER — Encounter: Payer: Self-pay | Admitting: Neurology

## 2019-06-06 ENCOUNTER — Other Ambulatory Visit: Payer: Self-pay

## 2019-06-06 VITALS — BP 141/77 | HR 76 | Temp 97.5°F | Ht 71.0 in | Wt 193.0 lb

## 2019-06-06 DIAGNOSIS — M5136 Other intervertebral disc degeneration, lumbar region: Secondary | ICD-10-CM | POA: Diagnosis not present

## 2019-06-06 DIAGNOSIS — M51369 Other intervertebral disc degeneration, lumbar region without mention of lumbar back pain or lower extremity pain: Secondary | ICD-10-CM

## 2019-06-06 DIAGNOSIS — M7918 Myalgia, other site: Secondary | ICD-10-CM

## 2019-06-06 DIAGNOSIS — G43711 Chronic migraine without aura, intractable, with status migrainosus: Secondary | ICD-10-CM | POA: Diagnosis not present

## 2019-06-06 DIAGNOSIS — G8929 Other chronic pain: Secondary | ICD-10-CM

## 2019-06-06 MED ORDER — AJOVY 225 MG/1.5ML ~~LOC~~ SOAJ: 225.0000 mg | SUBCUTANEOUS | 0 refills | Status: DC

## 2019-06-06 NOTE — Patient Instructions (Signed)
[facility] monthly Send to Dr. Maryjean Ka for pain procedures   Piriformis Syndrome  Piriformis syndrome is a condition that can cause pain and numbness in your buttocks and down the back of your leg. Piriformis syndrome happens when the small muscle that connects the base of your spine to your hip (piriformis muscle) presses on the nerve that runs down the back of your leg (sciatic nerve). The piriformis muscle helps your hip rotate and helps to bring your leg back and out. It also helps shift your weight to keep you stable while you are walking. The sciatic nerve runs under or through the piriformis muscle. Damage to the piriformis muscle can cause spasms that put pressure on the nerve below. This causes pain and discomfort while sitting and moving. The pain may feel as if it begins in the buttock and spreads (radiates) down your hip and thigh. What are the causes? This condition is caused by pressure on the sciatic nerve from the piriformis muscle. The piriformis muscle can get irritated with overuse, especially if other hip muscles are weak and the piriformis muscle has to do extra work. Piriformis syndrome can also occur after an injury, like a fall onto your buttocks. What increases the risk? You are more likely to develop this condition if you:  Are a woman.  Sit for long periods of time.  Are a cyclist.  Have weak buttocks muscles (gluteal muscles). What are the signs or symptoms? Symptoms of this condition include:  Pain, tingling, or numbness that starts in the buttock and runs down the back of your leg (sciatica).  Pain in the groin or thigh area. Your symptoms may get worse:  The longer you sit.  When you walk, run, or climb stairs.  When straining to have a bowel movement. How is this diagnosed? This condition is diagnosed based on your symptoms, medical history, and physical exam.  During the exam, your health care provider may: ? Move your leg into different  positions to check for pain. ? Press on the muscles of your hip and buttock to see if that increases your symptoms.  You may also have tests, including: ? Imaging tests such as X-rays, MRI, or ultrasound. ? Electromyogram (EMG). This test measures electrical signals sent by your nerves into the muscles. ? Nerve conduction study. This test measures how well electrical signals pass through your nerves. How is this treated? This condition may be treated by:  Stopping all activities that cause pain or make your condition worse.  Applying ice or using heat therapy.  Taking medicines to reduce pain and swelling.  Taking a muscle relaxer (muscle relaxant) to stop muscle spasms.  Doing range-of-motion and strengthening exercises (physical therapy) as told by your health care provider.  Massaging the area.  Having acupuncture.  Getting an injection of medicine in the piriformis muscle. Your health care provider will choose the medicine based on your condition. He or she may inject: ? An anti-inflammatory medicine (steroid) to reduce swelling. ? A numbing medicine (local anesthetic) to block the pain. ? Botulinum toxin. The toxin blocks nerve impulses to specific muscles to reduce muscle tension. In rare cases, you may need surgery to cut the muscle and release pressure on the nerve if other treatments do not work. Follow these instructions at home: Activity  Do not sit for long periods. Get up and walk around every 20 minutes or as often as told by your health care provider. ? When driving long distances, make  sure to take frequent stops to get up and stretch.  Use a cushion when you sit on hard surfaces.  Do exercises as told by your health care provider.  Return to your normal activities as told by your health care provider. Ask your health care provider what activities are safe for you. Managing pain, stiffness, and swelling      If directed, apply heat to the affected area as  often as told by your health care provider. Use the heat source that your health care provider recommends, such as a moist heat pack or a heating pad. ? Place a towel between your skin and the heat source. ? Leave the heat on for 20-30 minutes. ? Remove the heat if your skin turns bright red. This is especially important if you are unable to feel pain, heat, or cold. You may have a greater risk of getting burned.  If directed, put ice on the injured area. ? Put ice in a plastic bag. ? Place a towel between your skin and the bag. ? Leave the ice on for 20 minutes, 2-3 times a day. General instructions  Take over-the-counter and prescription medicines only as told by your health care provider.  Ask your health care provider if the medicine prescribed to you requires you to avoid driving or using heavy machinery.  You may need to take actions to prevent or treat constipation, such as: ? Drink enough fluid to keep your urine pale yellow. ? Take over-the-counter or prescription medicines. ? Eat foods that are high in fiber, such as beans, whole grains, and fresh fruits and vegetables. ? Limit foods that are high in fat and processed sugars, such as fried or sweet foods.  Keep all follow-up visits as told by your health care provider. This is important. How is this prevented?  Do not sit for longer than 20 minutes at a time. When you sit, choose padded surfaces.  Warm up and stretch before being active.  Cool down and stretch after being active.  Give your body time to rest between periods of activity.  Make sure to use equipment that fits you.  Maintain physical fitness, including: ? Strength. ? Flexibility. Contact a health care provider if:  Your pain and stiffness continue or get worse.  Your leg or hip becomes weak.  You have changes in your bowel function or bladder function. Summary  Piriformis syndrome is a condition that can cause pain, tingling, and numbness in your  buttocks and down the back of your leg.  You may try applying heat or ice to relieve the pain.  Do not sit for long periods. Get up and walk around every 20 minutes or as often as told by your health care provider. This information is not intended to replace advice given to you by your health care provider. Make sure you discuss any questions you have with your health care provider. Document Revised: 05/19/2018 Document Reviewed: 09/22/2017 Elsevier Patient Education  [place].  Occipital Neuralgia  Occipital neuralgia is a type of headache that causes brief episodes of very bad pain in the back of your head. Pain from occipital neuralgia may spread (radiate) to other parts of your head. These headaches may be caused by irritation of the nerves that leave your spinal cord high up in your neck, just below the base of your skull (occipital nerves). Your occipital nerves transmit sensations from the back of your head, the top of your head, and the areas behind  your ears. What are the causes? This condition can occur without any known cause (primary headache syndrome). In other cases, this condition is caused by pressure on or irritation of one of the two occipital nerves. Pressure and irritation may be due to:  Muscle spasm in the neck.  Neck injury.  Wear and tear of the vertebrae in the neck (osteoarthritis).  Disease of the disks that separate the vertebrae.  Swollen blood vessels that put pressure on the occipital nerves.  Infections.  Tumors.  Diabetes. What are the signs or symptoms? This condition causes brief burning, stabbing, electric, shocking, or shooting pain which can radiate to the top of the head. It can happen on one side or both sides of the head. It can also cause:  Pain behind the eye.  Pain triggered by neck movement or hair brushing.  Scalp tenderness.  Aching in the back of the head between episodes of very bad pain.  Pain gets worse with  exposure to bright lights. How is this diagnosed? There is no test that diagnoses this condition. Your health care provider may diagnose this condition based on a physical exam and your symptoms. Other tests may be done, such as:  Imaging studies of the brain and neck (cervical spine), such as an MRI or CT scan. These look for causes of pinched nerves.  Applying pressure to the nerves in the neck to try to re-create the pain.  Injection of numbing medicine into the occipital nerve areas to see if pain goes away (diagnostic nerve block). How is this treated? Treatment for this condition may begin with simple measures, such as:  Rest.  Massage.  Applying heat or cold on the area.  Over-the-counter pain relievers. If these measures do not work, you may need other treatments, including:  Medicines, such as: ? Prescription-strength anti-inflammatory medicines. ? Muscle relaxants. ? Anti-seizure medicines, which can relieve pain. ? Antidepressants, which can relieve pain. ? Injected medicines, such as medicines that numb the area (local anesthetic) and steroids.  Pulsed radiofrequency ablation. This is when wires are implanted to deliver electrical impulses that block pain signals from the occipital nerve.  Surgery to relieve nerve pressure.  Physical therapy. Follow these instructions at home: Pain management      Avoid any activities that cause pain.  Rest when you have an attack of pain.  Try gentle massage to relieve pain.  Try a different pillow or sleeping position.  If directed, apply heat to the affected area as told by your health care provider. Use the heat source that your health care provider recommends, such as a moist heat pack or a heating pad. ? Place a towel between your skin and the heat source. ? Leave the heat on for 20-30 minutes. ? Remove the heat if your skin turns bright red. This is especially important if you are unable to feel pain, heat, or  cold. You may have a greater risk of getting burned.  If directed, apply ice to the back of the head and neck area as told by your health care provider. ? Put ice in a plastic bag. ? Place a towel between your skin and the bag. ? Leave the ice on for 20 minutes, 2-3 times per day. General instructions  Take over-the-counter and prescription medicines only as told by your health care provider.  Avoid things that make your symptoms worse, such as bright lights.  Try to stay active. Get regular exercise that does not cause pain. Ask  your health care provider to suggest safe exercises for you.  Work with a physical therapist to learn stretching exercises you can do at home.  Practice good posture.  Keep all follow-up visits as told by your health care provider. This is important. Contact a health care provider if:  Your medicine is not working.  You have new or worsening symptoms. Get help right away if:  You have very bad head pain that does not go away.  You have a sudden change in vision, balance, or speech. Summary  Occipital neuralgia is a type of headache that causes brief episodes of very bad pain in the back of your head.  Pain from occipital neuralgia may spread (radiate) to other parts of your head.  Treatment for this condition includes rest, massage, and medicines. This information is not intended to replace advice given to you by your health care provider. Make sure you discuss any questions you have with your health care provider. Document Revised: 01/12/2017 Document Reviewed: 04/02/2016 Elsevier Patient Education  [place].  Migraine Headache A migraine headache is an intense, throbbing pain on one side or both sides of the head. Migraine headaches may also cause other symptoms, such as nausea, vomiting, and sensitivity to light and noise. A migraine headache can last from 4 hours to 3 days. Talk with your doctor about what things may bring on (trigger)  your migraine headaches. What are the causes? The exact cause of this condition is not known. However, a migraine may be caused when nerves in the brain become irritated and release chemicals that cause inflammation of blood vessels. This inflammation causes pain. This condition may be triggered or caused by:  Drinking alcohol.  Smoking.  Taking medicines, such as: ? Medicine used to treat chest pain (nitroglycerin). ? Birth control pills. ? Estrogen. ? Certain blood pressure medicines.  Eating or drinking products that contain nitrates, glutamate, aspartame, or tyramine. Aged cheeses, chocolate, or caffeine may also be triggers.  Doing physical activity. Other things that may trigger a migraine headache include:  Menstruation.  Pregnancy.  Hunger.  Stress.  Lack of sleep or too much sleep.  Weather changes.  Fatigue. What increases the risk? The following factors may make you more likely to experience migraine headaches:  Being a certain age. This condition is more common in people who are 12-82 years old.  Being male.  Having a family history of migraine headaches.  Being Caucasian.  Having a mental health condition, such as depression or anxiety.  Being obese. What are the signs or symptoms? The main symptom of this condition is pulsating or throbbing pain. This pain may:  Happen in any area of the head, such as on one side or both sides.  Interfere with daily activities.  Get worse with physical activity.  Get worse with exposure to bright lights or loud noises. Other symptoms may include:  Nausea.  Vomiting.  Dizziness.  General sensitivity to bright lights, loud noises, or smells. Before you get a migraine headache, you may get warning signs (an aura). An aura may include:  Seeing flashing lights or having blind spots.  Seeing bright spots, halos, or zigzag lines.  Having tunnel vision or blurred vision.  Having numbness or a tingling  feeling.  Having trouble talking.  Having muscle weakness. Some people have symptoms after a migraine headache (postdromal phase), such as:  Feeling tired.  Difficulty concentrating. How is this diagnosed? A migraine headache can be diagnosed based on:  Your symptoms.  A physical exam.  Tests, such as: ? CT scan or an MRI of the head. These imaging tests can help rule out other causes of headaches. ? Taking fluid from the spine (lumbar puncture) and analyzing it (cerebrospinal fluid analysis, or CSF analysis). How is this treated? This condition may be treated with medicines that:  Relieve pain.  Relieve nausea.  Prevent migraine headaches. Treatment for this condition may also include:  Acupuncture.  Lifestyle changes like avoiding foods that trigger migraine headaches.  Biofeedback.  Cognitive behavioral therapy. Follow these instructions at home: Medicines  Take over-the-counter and prescription medicines only as told by your health care provider.  Ask your health care provider if the medicine prescribed to you: ? Requires you to avoid driving or using heavy machinery. ? Can cause constipation. You may need to take these actions to prevent or treat constipation:  Drink enough fluid to keep your urine pale yellow.  Take over-the-counter or prescription medicines.  Eat foods that are high in fiber, such as beans, whole grains, and fresh fruits and vegetables.  Limit foods that are high in fat and processed sugars, such as fried or sweet foods. Lifestyle  Do not drink alcohol.  Do not use any products that contain nicotine or tobacco, such as cigarettes, e-cigarettes, and chewing tobacco. If you need help quitting, ask your health care provider.  Get at least 8 hours of sleep every night.  Find ways to manage stress, such as meditation, deep breathing, or yoga. General instructions      Keep a journal to find out what may trigger your migraine  headaches. For example, write down: ? What you eat and drink. ? How much sleep you get. ? Any change to your diet or medicines.  If you have a migraine headache: ? Avoid things that make your symptoms worse, such as bright lights. ? It may help to lie down in a dark, quiet room. ? Do not drive or use heavy machinery. ? Ask your health care provider what activities are safe for you while you are experiencing symptoms.  Keep all follow-up visits as told by your health care provider. This is important. Contact a health care provider if:  You develop symptoms that are different or more severe than your usual migraine headache symptoms.  You have more than 15 headache days in one month. Get help right away if:  Your migraine headache becomes severe.  Your migraine headache lasts longer than 72 hours.  You have a fever.  You have a stiff neck.  You have vision loss.  Your muscles feel weak or like you cannot control them.  You start to lose your balance often.  You have trouble walking.  You faint.  You have a seizure. Summary  A migraine headache is an intense, throbbing pain on one side or both sides of the head. Migraines may also cause other symptoms, such as nausea, vomiting, and sensitivity to light and noise.  This condition may be treated with medicines and lifestyle changes. You may also need to avoid certain things that trigger a migraine headache.  Keep a journal to find out what may trigger your migraine headaches.  Contact your health care provider if you have more than 15 headache days in a month or you develop symptoms that are different or more severe than your usual migraine headache symptoms. This information is not intended to replace advice given to you by your health care provider. Make sure you  discuss any questions you have with your health care provider. Document Revised: 05/20/2018 Document Reviewed: 03/10/2018 Elsevier Patient Education  [place].

## 2019-06-07 ENCOUNTER — Encounter: Payer: Self-pay | Admitting: *Deleted

## 2019-06-07 ENCOUNTER — Other Ambulatory Visit: Payer: Self-pay

## 2019-06-07 ENCOUNTER — Ambulatory Visit: Payer: PPO | Admitting: Cardiovascular Disease

## 2019-06-07 ENCOUNTER — Encounter: Payer: Self-pay | Admitting: Cardiovascular Disease

## 2019-06-07 VITALS — BP 110/70 | HR 77 | Ht 71.0 in | Wt 193.0 lb

## 2019-06-07 DIAGNOSIS — E7800 Pure hypercholesterolemia, unspecified: Secondary | ICD-10-CM | POA: Diagnosis not present

## 2019-06-07 DIAGNOSIS — I25110 Atherosclerotic heart disease of native coronary artery with unstable angina pectoris: Secondary | ICD-10-CM | POA: Diagnosis not present

## 2019-06-07 DIAGNOSIS — I255 Ischemic cardiomyopathy: Secondary | ICD-10-CM | POA: Diagnosis not present

## 2019-06-07 DIAGNOSIS — Z01812 Encounter for preprocedural laboratory examination: Secondary | ICD-10-CM

## 2019-06-07 LAB — CBC
Hematocrit: 38.3 % (ref 37.5–51.0)
Hemoglobin: 12.6 g/dL — ABNORMAL LOW (ref 13.0–17.7)
MCH: 29.4 pg (ref 26.6–33.0)
MCHC: 32.9 g/dL (ref 31.5–35.7)
MCV: 89 fL (ref 79–97)
Platelets: 298 10*3/uL (ref 150–450)
RBC: 4.29 x10E6/uL (ref 4.14–5.80)
RDW: 14.3 % (ref 11.6–15.4)
WBC: 9.6 10*3/uL (ref 3.4–10.8)

## 2019-06-07 LAB — BASIC METABOLIC PANEL
BUN/Creatinine Ratio: 19 (ref 10–24)
BUN: 24 mg/dL (ref 8–27)
CO2: 26 mmol/L (ref 20–29)
Calcium: 9 mg/dL (ref 8.6–10.2)
Chloride: 106 mmol/L (ref 96–106)
Creatinine, Ser: 1.25 mg/dL (ref 0.76–1.27)
GFR calc Af Amer: 65 mL/min/{1.73_m2} (ref >59)
GFR calc non Af Amer: 56 mL/min/{1.73_m2} — ABNORMAL LOW (ref >59)
Glucose: 100 mg/dL — ABNORMAL HIGH (ref 65–99)
Potassium: 4.7 mmol/L (ref 3.5–5.2)
Sodium: 137 mmol/L (ref 134–144)

## 2019-06-07 NOTE — Progress Notes (Signed)
Chief Complaint  Patient presents with  . Follow-up    CAD    History of Present Illness: 74 yo male with history of CAD s/p CABG 2007, Non-hodgkins Lymphoma, HLD here today for cardiac follow up. He had a 4V CABG in 2007. Cath in 2010 showed occluded SVG to Diagonal and occluded SVG to PDA/PLA withpatent LIMA graft to the  LAD. There was diffuse disease in distal vessels felt best to be managed medically. 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 Echo April 2018 with LVEF=40-45% and hypokinesis of the anteroseptal wall and apex. Nuclear stress test April 2018 with possible ischemia. Cardiac cath 06/17/16 with patent LIMA to LAD, all other grafts known to be occluded. Severe stenosis in the proximal RCA treated with a drug eluting stent. He was seen in our office August 2019 by Lyda Jester, PA-C with c/o dyspnea with exertion. Cardiac monitor with sinus rhythm, rare PVCs, rare PACs. Nuclear stress test September 2019 without ischemia. Echo September 2019 with LVEF=40%, trivial MR. He was tried on Cozaar in October 2019 but did not tolerate due to dizziness and presumed hypotension.    He is here today for follow up. The patient denies any chest pain, palpitations, lower extremity edema, orthopnea, PND, dizziness, near syncope or syncope. He has had progressive dyspnea.   Primary Care Physician: [name], MD  Past Medical History:  Diagnosis Date  . Allergic rhinitis    grass, dust  . Buttock pain   . CAD (coronary artery disease)    Last cath 2010 per Dr. Doreatha Lew with diffuse multivessel disease, small caliber vessels not felt to be suitable for PCI  . Chronic left hip pain   . Colon polyps    pt says a colonoscopy did not confirm this, said there was nothing there  . Diverticulosis of sigmoid colon 2010   colonoscopy - [facility]  . Dizziness    1 episode   . Fibromyalgia   . GERD (gastroesophageal reflux disease)   . Head pain    "not  chronic; I have myofasial tightening in my head that causes pain in my skull" (06/17/2016)  . HLD (hyperlipidemia)   . Hypertension   . Leiomyosarcoma of chest wall ([place]) 11/2015   surgical excision alone recommended (per notes from Dr. Shon Baton office)  . Myeloma ([place])    "found it in my chest when they did the excision"  . Myocardial infarction ([place]) 06/2005  . Non Hodgkin's lymphoma ([place]) dx'd 06/2005    Past Surgical History:  Procedure Laterality Date  . BALLOON SINUPLASTY  2014  . CARDIAC CATHETERIZATION  ~ 2008   "Dr. Doreatha Lew"  . CATARACT EXTRACTION W/ INTRAOCULAR LENS IMPLANT Right   . colonscopy  2010  . CORONARY ANGIOPLASTY WITH STENT PLACEMENT  06/17/2016  . CORONARY ARTERY BYPASS GRAFT  5/07   x5. LV dysfunction.   . CORONARY STENT INTERVENTION N/A 06/17/2016   Procedure: Coronary Stent Intervention;  Surgeon: Burnell Blanks, MD;  Location: [place] [facility];  Service: Cardiovascular;  Laterality: N/A;  . epidural steroid injections    . LAPAROSCOPIC CHOLECYSTECTOMY  1996  . LEFT HEART CATH AND CORS/GRAFTS ANGIOGRAPHY N/A 06/17/2016   Procedure: Left Heart Cath and Cors/Grafts Angiography;  Surgeon: Burnell Blanks, MD;  Location: [place] [facility];  Service: Cardiovascular;  Laterality: N/A;  . MASS EXCISION N/A 11/11/2015   Procedure: EXCISION OF MID CHEST  MASS;  Surgeon: Judeth Horn, MD;  Location: [place];  Service: General;  Laterality: N/A;  . MASS EXCISION N/A 12/23/2015   Procedure: REEXCISION OF CHEST WALL TUMOR SITE;  Surgeon: Judeth Horn, MD;  Location: [place];  Service: General;  Laterality: N/A;  . POPLITEAL SYNOVIAL CYST EXCISION Right   . TONSILLECTOMY  1952   & Adenoidectomy    Current Outpatient Medications  Medication Sig Dispense Refill  . cholecalciferol (VITAMIN D) 1000 units tablet Take 2,000 Units by mouth daily.     . clopidogrel (PLAVIX) 75 MG tablet TAKE 1 TABLET (75 MG TOTAL) BY MOUTH  DAILY WITH BREAKFAST. 90 tablet 3  . Coenzyme Q10 300 MG CAPS Take 300 mg by mouth daily.    . cyclobenzaprine (FLEXERIL) 10 MG tablet Take 10 mg by mouth 2 (two) times daily.    Marland Kitchen ezetimibe (ZETIA) 10 MG tablet TAKE 1 TABLET BY MOUTH EVERY DAY 90 tablet 2  . fluocinonide (LIDEX) 0.05 % external solution APPLY ON THE SKIN DAILY    . Fremanezumab-vfrm (AJOVY) 225 MG/1.5ML SOAJ Inject 225 mg into the skin every 30 (thirty) days. 3 pen 0  . L-Arginine 1000 MG TABS Take 1,000 mg by mouth 2 (two) times daily.    . meloxicam (MOBIC) 15 MG tablet Take 15 mg by mouth as needed for pain.     . metoprolol succinate (TOPROL-XL) 25 MG 24 hr tablet TAKE 1 TABLET BY MOUTH DAILY. 90 tablet 2  . naltrexone (DEPADE) 50 MG tablet Take by mouth 2 (two) times daily.    . nitroGLYCERIN (NITROSTAT) 0.4 MG SL tablet Place 1 tablet (0.4 mg total) under the tongue every 5 (five) minutes as needed for chest pain. 25 tablet 3  . OVER THE COUNTER MEDICATION Take 240 mg by mouth daily. Called : [facility]    . rosuvastatin (CRESTOR) 40 MG tablet TAKE 1 TABLET BY MOUTH EVERY DAY 90 tablet 1  . triamcinolone (NASACORT) 55 MCG/ACT AERO nasal inhaler Place 1 spray into the nose daily as needed (for allergies).     Marland Kitchen UNABLE TO FIND L-Arginine & L-Citriline     No current facility-administered medications for this visit.    Allergies  Allergen Reactions  . Niacin And Related Nausea And Vomiting  . Niaspan [Niacin Er] Nausea And Vomiting and Other (See Comments)    Stomach intolerance    Social History   Socioeconomic History  . Marital status: Married    Spouse name: [name]  . Number of children: 2  . Years of education: college  . Highest education level: Not on file  Occupational History  . Occupation: retired  Tobacco Use  . Smoking status: Former Smoker    Packs/day: 1.50    Years: 20.00    Pack years: 30.00    Types: Cigarettes  . Smokeless tobacco: Never Used  . Tobacco comment: "quit 06/2005"    Substance and Sexual Activity  . Alcohol use: No    Alcohol/week: 0.0 standard drinks  . Drug use: No  . Sexual activity: Not on file  Other Topics Concern  . Not on file  Social History Narrative   Married, 2 children.   Runs an executive recruiting company.    Left-handed.   Caffeine: iced tea, green tea; tea daily    Social Determinants of Health   Financial Resource Strain:   . Difficulty of Paying Living Expenses:   Food Insecurity:   . Worried About Charity fundraiser in the Last Year:   .  Ran Out of Food in the Last Year:   Transportation Needs:   . Film/video editor (Medical):   Marland Kitchen Lack of Transportation (Non-Medical):   Physical Activity:   . Days of Exercise per Week:   . Minutes of Exercise per Session:   Stress:   . Feeling of Stress :   Social Connections:   . Frequency of Communication with Friends and Family:   . Frequency of Social Gatherings with Friends and Family:   . Attends Religious Services:   . Active Member of Clubs or Organizations:   . Attends Archivist Meetings:   Marland Kitchen Marital Status:   Intimate Partner Violence:   . Fear of Current or Ex-Partner:   . Emotionally Abused:   Marland Kitchen Physically Abused:   . Sexually Abused:     Family History  Problem Relation Age of Onset  . Heart disease Father   . Heart attack Father   . Congestive Heart Failure Father   . Dementia Mother   . Cancer Paternal Grandfather        ? type    Review of Systems:  As stated in the HPI and otherwise negative.   BP 110/70   Pulse 77   Ht 5\' 11"  (1.803 m)   Wt 193 lb (87.5 kg)   SpO2 98%   BMI 26.92 kg/m   Physical Examination:  General: Well developed, well nourished, NAD  HEENT: OP clear, mucus membranes moist  SKIN: warm, dry. No rashes. Neuro: No focal deficits  Musculoskeletal: Muscle strength 5/5 all ext  Psychiatric: Mood and affect normal  Neck: No JVD, no carotid bruits, no thyromegaly, no lymphadenopathy.  Lungs:Clear  bilaterally, no wheezes, rhonci, crackles Cardiovascular: Regular rate and rhythm. No murmurs, gallops or rubs. Abdomen:Soft. Bowel sounds present. Non-tender.  Extremities: No lower extremity edema. Pulses are 2 + in the bilateral DP/PT.  Echo September 2019: - Left ventricle: The cavity size was normal. Wall thickness was   increased in a pattern of mild LVH. Systolic function was   moderately reduced. The estimated ejection fraction was in the   range of 35% to 40%. Anteroseptal, inferoseptal, and anterior   hypokinesis. Doppler parameters are consistent with abnormal left   ventricular relaxation (grade 1 diastolic dysfunction). - Aortic valve: There was no stenosis. - Mitral valve: Mildly calcified annulus. There was trivial   regurgitation. - Right ventricle: The cavity size was normal. Systolic function   was mildly reduced. - Tricuspid valve: Peak RV-RA gradient (S): 27 mm Hg. - Pulmonary arteries: PA peak pressure: 30 mm Hg (S). - Inferior vena cava: The vessel was normal in size. The   respirophasic diameter changes were in the normal range (>= 50%),   consistent with normal central venous pressure.  Impressions:  - Normal LV size with mild LV hypertrophy. EF 35-40% with   anteroseptal, inferoseptal, and anterior hypokinesis. Normal RV   size with mildly decreased systolic function. No significant   valvular abnormalities.   EKG:  EKG is ordered today. The ekg ordered today demonstrates sinus  Recent Labs: 01/26/2019: ALT 32   Lipid Panel Followed in primary care   Wt Readings from Last 3 Encounters:  06/07/19 193 lb (87.5 kg)  06/06/19 193 lb (87.5 kg)  03/13/19 192 lb 9.6 oz (87.4 kg)     Other studies Reviewed: Additional studies/ records that were reviewed today include: . Review of the above records demonstrates:   Assessment and Plan:   1. CAD with unstable  angina: Cardiac cath May 2018 with patent LIMA to LAD, severe disease in the RCA and  moderately severe disease in the circumflex. A drug eluting stent was placed in the RCA. He does have moderately severe disease in the Circumflex that is unchanged. Nuclear stress test September 2019 with no ischemia. He is now having progressive dyspnea which has been his anginal equivalent. Will arrange cardiac cath at [facility] 06/14/19 at 8:30 am.  I have reviewed the risks, indications, and alternatives to cardiac catheterization, possible angioplasty, and stenting with the patient. Risks include but are not limited to bleeding, infection, vascular injury, stroke, myocardial infection, arrhythmia, kidney injury, radiation-related injury in the case of prolonged fluoroscopy use, emergency cardiac surgery, and death. The patient understands the risks of serious complication is 1-2 in [id number] with diagnostic cardiac cath and 1-2% or less with angioplasty/stenting. Continue Plavix, statin and beta blocker.      Restart ASA 81 mg daily  2. Hyperlipidemia:Lipids followed in primary care. Last LDL 47 in August 2020. Continue statin  3. Ischemic cardiomyopathy: LVEF=35-40% by echo September 2019. He did not tolerate low dose Cozaar due to hypotension and dizziness. I suspect that he will not tolerate afterload reducing agents. Continue beta blocker.   Current medicines are reviewed at length with the patient today.  The patient does not have concerns regarding medicines.  The following changes have been made:  no change  Labs/ tests ordered today include:   Orders Placed This Encounter  Procedures  . CBC  . Basic metabolic panel  . EKG 12-Lead    Disposition:   FU with me or care team APP post cath   Signed, Lauree Chandler, MD 06/07/2019 11:37 AM    [address], [place], [place]  [address] Phone: [phone]; Fax: [phone]

## 2019-06-07 NOTE — Patient Instructions (Signed)
Medication Instructions:  No changes *If you need a refill on your cardiac medications before your next appointment, please call your pharmacy*   Lab Work: Today: BMET, CBC  Testing/Procedures: Your physician has requested that you have a cardiac catheterization. Cardiac catheterization is used to diagnose and/or treat various heart conditions. Doctors may recommend this procedure for a number of different reasons. The most common reason is to evaluate chest pain. Chest pain can be a symptom of coronary artery disease (CAD), and cardiac catheterization can show whether plaque is narrowing or blocking your heart's arteries. This procedure is also used to evaluate the valves, as well as measure the blood flow and oxygen levels in different parts of your heart. For further information please visit [email or web address]. Please follow instruction sheet, as given.  COVID SCREENING: [address] -06/12/19 at 1:50 pm   Follow-Up: At [facility], you and your health needs are our priority.  As part of our continuing mission to provide you with exceptional heart care, we have created designated Provider Care Teams.  These Care Teams include your primary Cardiologist (physician) and Advanced Practice Providers (APPs -  Physician Assistants and Nurse Practitioners) who all work together to provide you with the care you need, when you need it.  Your next appointment:   3 week(s)  The format for your next appointment:   In Person  Provider:   You may see one of the following Advanced Practice Providers on your designated Care Team:    Melina Copa, PA-C  Ermalinda Barrios, PA-C    Other Instructions

## 2019-06-07 NOTE — H&P (View-Only) (Signed)
Chief Complaint  Patient presents with  . Follow-up    CAD    History of Present Illness: 74 yo male with history of CAD s/p CABG 2007, Non-hodgkins Lymphoma, HLD here today for cardiac follow up. He had a 4V CABG in 2007. Cath in 2010 showed occluded SVG to Diagonal and occluded SVG to PDA/PLA withpatent LIMA graft to the  LAD. There was diffuse disease in distal vessels felt best to be managed medically. 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 Echo April 2018 with LVEF=40-45% and hypokinesis of the anteroseptal wall and apex. Nuclear stress test April 2018 with possible ischemia. Cardiac cath 06/17/16 with patent LIMA to LAD, all other grafts known to be occluded. Severe stenosis in the proximal RCA treated with a drug eluting stent. He was seen in our office August 2019 by Lyda Jester, PA-C with c/o dyspnea with exertion. Cardiac monitor with sinus rhythm, rare PVCs, rare PACs. Nuclear stress test September 2019 without ischemia. Echo September 2019 with LVEF=40%, trivial MR. He was tried on Cozaar in October 2019 but did not tolerate due to dizziness and presumed hypotension.    He is here today for follow up. The patient denies any chest pain, palpitations, lower extremity edema, orthopnea, PND, dizziness, near syncope or syncope. He has had progressive dyspnea.   Primary Care Physician: [name], MD  Past Medical History:  Diagnosis Date  . Allergic rhinitis    grass, dust  . Buttock pain   . CAD (coronary artery disease)    Last cath 2010 per Dr. Doreatha Lew with diffuse multivessel disease, small caliber vessels not felt to be suitable for PCI  . Chronic left hip pain   . Colon polyps    pt says a colonoscopy did not confirm this, said there was nothing there  . Diverticulosis of sigmoid colon 2010   colonoscopy - [facility]  . Dizziness    1 episode   . Fibromyalgia   . GERD (gastroesophageal reflux disease)   . Head pain    "not  chronic; I have myofasial tightening in my head that causes pain in my skull" (06/17/2016)  . HLD (hyperlipidemia)   . Hypertension   . Leiomyosarcoma of chest wall ([place]) 11/2015   surgical excision alone recommended (per notes from Dr. Shon Baton office)  . Myeloma ([name])    "found it in my chest when they did the excision"  . Myocardial infarction ([place]) 06/2005  . Non Hodgkin's lymphoma ([place]) dx'd 06/2005    Past Surgical History:  Procedure Laterality Date  . BALLOON SINUPLASTY  2014  . CARDIAC CATHETERIZATION  ~ 2008   "Dr. Doreatha Lew"  . CATARACT EXTRACTION W/ INTRAOCULAR LENS IMPLANT Right   . colonscopy  2010  . CORONARY ANGIOPLASTY WITH STENT PLACEMENT  06/17/2016  . CORONARY ARTERY BYPASS GRAFT  5/07   x5. LV dysfunction.   . CORONARY STENT INTERVENTION N/A 06/17/2016   Procedure: Coronary Stent Intervention;  Surgeon: Burnell Blanks, MD;  Location: [facility];  Service: Cardiovascular;  Laterality: N/A;  . epidural steroid injections    . LAPAROSCOPIC CHOLECYSTECTOMY  1996  . LEFT HEART CATH AND CORS/GRAFTS ANGIOGRAPHY N/A 06/17/2016   Procedure: Left Heart Cath and Cors/Grafts Angiography;  Surgeon: Burnell Blanks, MD;  Location: [facility];  Service: Cardiovascular;  Laterality: N/A;  . MASS EXCISION N/A 11/11/2015   Procedure: EXCISION OF MID CHEST  MASS;  Surgeon: Judeth Horn, MD;  Location: [facility];  Service: General;  Laterality: N/A;  . MASS EXCISION N/A 12/23/2015   Procedure: REEXCISION OF CHEST WALL TUMOR SITE;  Surgeon: Judeth Horn, MD;  Location: [facility];  Service: General;  Laterality: N/A;  . POPLITEAL SYNOVIAL CYST EXCISION Right   . TONSILLECTOMY  1952   & Adenoidectomy    Current Outpatient Medications  Medication Sig Dispense Refill  . cholecalciferol (VITAMIN D) 1000 units tablet Take 2,000 Units by mouth daily.     . clopidogrel (PLAVIX) 75 MG tablet TAKE 1 TABLET (75 MG TOTAL) BY MOUTH  DAILY WITH BREAKFAST. 90 tablet 3  . Coenzyme Q10 300 MG CAPS Take 300 mg by mouth daily.    . cyclobenzaprine (FLEXERIL) 10 MG tablet Take 10 mg by mouth 2 (two) times daily.    Marland Kitchen ezetimibe (ZETIA) 10 MG tablet TAKE 1 TABLET BY MOUTH EVERY DAY 90 tablet 2  . fluocinonide (LIDEX) 0.05 % external solution APPLY ON THE SKIN DAILY    . Fremanezumab-vfrm (AJOVY) 225 MG/1.5ML SOAJ Inject 225 mg into the skin every 30 (thirty) days. 3 pen 0  . L-Arginine 1000 MG TABS Take 1,000 mg by mouth 2 (two) times daily.    . meloxicam (MOBIC) 15 MG tablet Take 15 mg by mouth as needed for pain.     . metoprolol succinate (TOPROL-XL) 25 MG 24 hr tablet TAKE 1 TABLET BY MOUTH DAILY. 90 tablet 2  . naltrexone (DEPADE) 50 MG tablet Take by mouth 2 (two) times daily.    . nitroGLYCERIN (NITROSTAT) 0.4 MG SL tablet Place 1 tablet (0.4 mg total) under the tongue every 5 (five) minutes as needed for chest pain. 25 tablet 3  . OVER THE COUNTER MEDICATION Take 240 mg by mouth daily. Called : [facility]    . rosuvastatin (CRESTOR) 40 MG tablet TAKE 1 TABLET BY MOUTH EVERY DAY 90 tablet 1  . triamcinolone (NASACORT) 55 MCG/ACT AERO nasal inhaler Place 1 spray into the nose daily as needed (for allergies).     Marland Kitchen UNABLE TO FIND L-Arginine & L-Citriline     No current facility-administered medications for this visit.    Allergies  Allergen Reactions  . Niacin And Related Nausea And Vomiting  . Niaspan [Niacin Er] Nausea And Vomiting and Other (See Comments)    Stomach intolerance    Social History   Socioeconomic History  . Marital status: Married    Spouse name: [name]  . Number of children: 2  . Years of education: college  . Highest education level: Not on file  Occupational History  . Occupation: retired  Tobacco Use  . Smoking status: Former Smoker    Packs/day: 1.50    Years: 20.00    Pack years: 30.00    Types: Cigarettes  . Smokeless tobacco: Never Used  . Tobacco comment: "quit 06/2005"    Substance and Sexual Activity  . Alcohol use: No    Alcohol/week: 0.0 standard drinks  . Drug use: No  . Sexual activity: Not on file  Other Topics Concern  . Not on file  Social History Narrative   Married, 2 children.   Runs an executive recruiting company.    Left-handed.   Caffeine: iced tea, green tea; tea daily    Social Determinants of Health   Financial Resource Strain:   . Difficulty of Paying Living Expenses:   Food Insecurity:   . Worried About Charity fundraiser in the Last Year:   .  Ran Out of Food in the Last Year:   Transportation Needs:   . Film/video editor (Medical):   Marland Kitchen Lack of Transportation (Non-Medical):   Physical Activity:   . Days of Exercise per Week:   . Minutes of Exercise per Session:   Stress:   . Feeling of Stress :   Social Connections:   . Frequency of Communication with Friends and Family:   . Frequency of Social Gatherings with Friends and Family:   . Attends Religious Services:   . Active Member of Clubs or Organizations:   . Attends Archivist Meetings:   Marland Kitchen Marital Status:   Intimate Partner Violence:   . Fear of Current or Ex-Partner:   . Emotionally Abused:   Marland Kitchen Physically Abused:   . Sexually Abused:     Family History  Problem Relation Age of Onset  . Heart disease Father   . Heart attack Father   . Congestive Heart Failure Father   . Dementia Mother   . Cancer Paternal Grandfather        ? type    Review of Systems:  As stated in the HPI and otherwise negative.   BP 110/70   Pulse 77   Ht 5\' 11"  (1.803 m)   Wt 193 lb (87.5 kg)   SpO2 98%   BMI 26.92 kg/m   Physical Examination:  General: Well developed, well nourished, NAD  HEENT: OP clear, mucus membranes moist  SKIN: warm, dry. No rashes. Neuro: No focal deficits  Musculoskeletal: Muscle strength 5/5 all ext  Psychiatric: Mood and affect normal  Neck: No JVD, no carotid bruits, no thyromegaly, no lymphadenopathy.  Lungs:Clear  bilaterally, no wheezes, rhonci, crackles Cardiovascular: Regular rate and rhythm. No murmurs, gallops or rubs. Abdomen:Soft. Bowel sounds present. Non-tender.  Extremities: No lower extremity edema. Pulses are 2 + in the bilateral DP/PT.  Echo September 2019: - Left ventricle: The cavity size was normal. Wall thickness was   increased in a pattern of mild LVH. Systolic function was   moderately reduced. The estimated ejection fraction was in the   range of 35% to 40%. Anteroseptal, inferoseptal, and anterior   hypokinesis. Doppler parameters are consistent with abnormal left   ventricular relaxation (grade 1 diastolic dysfunction). - Aortic valve: There was no stenosis. - Mitral valve: Mildly calcified annulus. There was trivial   regurgitation. - Right ventricle: The cavity size was normal. Systolic function   was mildly reduced. - Tricuspid valve: Peak RV-RA gradient (S): 27 mm Hg. - Pulmonary arteries: PA peak pressure: 30 mm Hg (S). - Inferior vena cava: The vessel was normal in size. The   respirophasic diameter changes were in the normal range (>= 50%),   consistent with normal central venous pressure.  Impressions:  - Normal LV size with mild LV hypertrophy. EF 35-40% with   anteroseptal, inferoseptal, and anterior hypokinesis. Normal RV   size with mildly decreased systolic function. No significant   valvular abnormalities.   EKG:  EKG is ordered today. The ekg ordered today demonstrates sinus  Recent Labs: 01/26/2019: ALT 32   Lipid Panel Followed in primary care   Wt Readings from Last 3 Encounters:  06/07/19 193 lb (87.5 kg)  06/06/19 193 lb (87.5 kg)  03/13/19 192 lb 9.6 oz (87.4 kg)     Other studies Reviewed: Additional studies/ records that were reviewed today include: . Review of the above records demonstrates:   Assessment and Plan:   1. CAD with unstable  angina: Cardiac cath May 2018 with patent LIMA to LAD, severe disease in the RCA and  moderately severe disease in the circumflex. A drug eluting stent was placed in the RCA. He does have moderately severe disease in the Circumflex that is unchanged. Nuclear stress test September 2019 with no ischemia. He is now having progressive dyspnea which has been his anginal equivalent. Will arrange cardiac cath at [facility] 06/14/19 at 8:30 am.  I have reviewed the risks, indications, and alternatives to cardiac catheterization, possible angioplasty, and stenting with the patient. Risks include but are not limited to bleeding, infection, vascular injury, stroke, myocardial infection, arrhythmia, kidney injury, radiation-related injury in the case of prolonged fluoroscopy use, emergency cardiac surgery, and death. The patient understands the risks of serious complication is 1-2 in [id number] with diagnostic cardiac cath and 1-2% or less with angioplasty/stenting. Continue Plavix, statin and beta blocker.      Restart ASA 81 mg daily  2. Hyperlipidemia:Lipids followed in primary care. Last LDL 47 in August 2020. Continue statin  3. Ischemic cardiomyopathy: LVEF=35-40% by echo September 2019. He did not tolerate low dose Cozaar due to hypotension and dizziness. I suspect that he will not tolerate afterload reducing agents. Continue beta blocker.   Current medicines are reviewed at length with the patient today.  The patient does not have concerns regarding medicines.  The following changes have been made:  no change  Labs/ tests ordered today include:   Orders Placed This Encounter  Procedures  . CBC  . Basic metabolic panel  . EKG 12-Lead    Disposition:   FU with me or care team APP post cath   Signed, Lauree Chandler, MD 06/07/2019 11:37 AM    [address], [place], [place]  [address] Phone: [phone]; Fax: [phone]

## 2019-06-08 ENCOUNTER — Telehealth: Payer: Self-pay | Admitting: *Deleted

## 2019-06-08 NOTE — Telephone Encounter (Signed)
Patient has a Botox appointment on Monday Jun 12, 2019.  I called [employer] and spoke to [place].  He states that [id number] and [phone] are valid and do not require PA.  Ref# for this call is [id number]

## 2019-06-12 ENCOUNTER — Ambulatory Visit: Payer: PPO | Admitting: Neurology

## 2019-06-12 ENCOUNTER — Other Ambulatory Visit (HOSPITAL_COMMUNITY)
Admission: RE | Admit: 2019-06-12 | Discharge: 2019-06-12 | Disposition: A | Discharge status: Home / Self Care | Payer: PPO | Source: Ambulatory Visit | Attending: Cardiovascular Disease | Admitting: Cardiovascular Disease

## 2019-06-12 ENCOUNTER — Other Ambulatory Visit: Payer: Self-pay

## 2019-06-12 VITALS — Temp 97.6°F

## 2019-06-12 DIAGNOSIS — G43711 Chronic migraine without aura, intractable, with status migrainosus: Secondary | ICD-10-CM | POA: Diagnosis not present

## 2019-06-12 DIAGNOSIS — M5481 Occipital neuralgia: Secondary | ICD-10-CM

## 2019-06-12 DIAGNOSIS — G5700 Lesion of sciatic nerve, unspecified lower limb: Secondary | ICD-10-CM

## 2019-06-12 DIAGNOSIS — Z01812 Encounter for preprocedural laboratory examination: Secondary | ICD-10-CM | POA: Insufficient documentation

## 2019-06-12 DIAGNOSIS — Z20822 Contact with and (suspected) exposure to covid-19: Secondary | ICD-10-CM | POA: Insufficient documentation

## 2019-06-12 LAB — SARS CORONAVIRUS 2 (TAT 6-24 HRS): SARS Coronavirus 2: NEGATIVE

## 2019-06-12 NOTE — Progress Notes (Signed)
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 Laurence Spates for evaluation of Occipital RFA or other interventions.Also for evaluation for injections into the piriformis muscle for buttocks pain vs ESI for lumbosacral radic.  Orders Placed This Encounter  Procedures  . Ambulatory referral to Orthopedic Surgery    Reviewed orally with patient, additionally signature is on file:  Botulism toxin has been approved by the Federal drug administration for treatment of chronic migraine. Botulism toxin does not cure chronic migraine and it may not be effective in some patients.  The administration of botulism toxin is accomplished by injecting a small amount of toxin into the muscles of the neck and head. Dosage must be titrated for each individual. Any benefits resulting from botulism toxin tend to wear off after 3 months with a repeat injection required if benefit is to be maintained. Injections are usually done every 3-4 months with maximum effect peak achieved by about 2 or 3 weeks. Botulism toxin is expensive and you should be sure of what costs you will incur resulting from the injection.  The side effects of botulism toxin use for chronic migraine may include:   -Transient, and usually mild, facial weakness with facial injections  -Transient, and usually mild, head or neck weakness with head/neck injections  -Reduction or loss of forehead facial animation due to forehead muscle weakness  -Eyelid drooping  -Dry eye  -Pain at the site of injection or bruising at the site of injection  -Double vision  -Potential unknown long term risks  Contraindications: You should not have Botox if you are pregnant, nursing, allergic to albumin, have an infection, skin condition, or muscle weakness at the site of the injection, or have myasthenia gravis, Lambert-Eaton syndrome, or ALS.  It is also possible that  as with any injection, there may be an allergic reaction or no effect from the medication. Reduced effectiveness after repeated injections is sometimes seen and rarely infection at the injection site may occur. All care will be taken to prevent these side effects. If therapy is given over a long time, atrophy and wasting in the muscle injected may occur. Occasionally the patient's become refractory to treatment because they develop antibodies to the toxin. In this event, therapy needs to be modified.  I have read the above information and consent to the administration of botulism toxin.    BOTOX PROCEDURE NOTE FOR MIGRAINE HEADACHE    Contraindications and precautions discussed with patient(above). Aseptic procedure was observed and patient tolerated procedure. Procedure performed by Dr. Georgia Dom  The condition has existed for more than 6 months, and pt does not have a diagnosis of ALS, Myasthenia Gravis or Lambert-Eaton Syndrome.  Risks and benefits of injections discussed and pt agrees to proceed with the procedure.  Written consent obtained  These injections are medically necessary. Pt  receives good benefits from these injections. These injections do not cause sedations or hallucinations which the oral therapies may cause.  Description of procedure:  The patient was placed in a sitting position. The standard protocol was used for Botox as follows, with 5 units of Botox injected at each site:   -Procerus muscle, midline injection  -Corrugator muscle, bilateral injection  -Frontalis muscle, bilateral injection, with 2 sites each side, medial injection was performed in the upper one third of the frontalis muscle, in the region vertical from the medial inferior edge of the superior orbital rim.  The lateral injection was again in the upper one third of the forehead vertically above the lateral limbus of the cornea, 1.5 cm lateral to the medial injection site.  -Temporalis muscle  injection, 4 sites, bilaterally. The first injection was 3 cm above the tragus of the ear, second injection site was 1.5 cm to 3 cm up from the first injection site in line with the tragus of the ear. The third injection site was 1.5-3 cm forward between the first 2 injection sites. The fourth injection site was 1.5 cm posterior to the second injection site.   -Occipitalis muscle injection, 3 sites, bilaterally. The first injection was done one half way between the occipital protuberance and the tip of the mastoid process behind the ear. The second injection site was done lateral and superior to the first, 1 fingerbreadth from the first injection. The third injection site was 1 fingerbreadth superiorly and medially from the first injection site.  -Cervical paraspinal muscle injection, 2 sites, bilateral knee first injection site was 1 cm from the midline of the cervical spine, 3 cm inferior to the lower border of the occipital protuberance. The second injection site was 1.5 cm superiorly and laterally to the first injection site.  -Trapezius muscle injection was performed at 3 sites, bilaterally. The first injection site was in the upper trapezius muscle halfway between the inflection point of the neck, and the acromion. The second injection site was one half way between the acromion and the first injection site. The third injection was done between the first injection site and the inflection point of the neck.   Will return for repeat injection in 3 months.   200 units of Botox was used, any Botox not injected was wasted. The patient tolerated the procedure well, there were no complications of the above procedure.

## 2019-06-12 NOTE — Progress Notes (Signed)
Botox consent signed today Botox- 200 units x 1 vial Lot: [id number] Expiration: 01/2022 NDC: [id number]  Bacteriostatic 0.9% Sodium Chloride- 3mL total Lot: [id number] Expiration: 08/10/2019 NDC: [id number]  Dx: [id number] B/B

## 2019-06-12 NOTE — Addendum Note (Signed)
Addended by: Sarina Ill B on: 06/12/2019 07:38 PM   Modules accepted: Orders

## 2019-06-13 ENCOUNTER — Telehealth: Payer: Self-pay | Admitting: *Deleted

## 2019-06-13 NOTE — Telephone Encounter (Signed)
Pt contacted pre-catheterization scheduled at [facility] for: Wednesday Jun 14, 2019 8:30 AM Verified arrival time and place: [facility]) at: 6:30 AM   No solid food after midnight prior to cath, clear liquids until 5 AM day of procedure.  AM meds can be  taken pre-cath with sip of water including: ASA 81 mg Clopidogrel 75 mg  Confirmed patient has responsible adult to drive home post procedure and observe 24 hours after arriving home: yes  You are allowed ONE visitor in the waiting room during your procedures. Both you and your visitor must wear masks.      COVID-19 Pre-Screening Questions:  . In the past 7 to 10 days have you had a cough,  shortness of breath, headache, congestion, fever (100 or greater) body aches, chills, sore throat, or sudden loss of taste or sense of smell? no . Have you been around anyone with known Covid 19 in the past 7 to 10 days? no . Have you been around anyone who is awaiting Covid 19 test results in the past 7 to 10 days? no . Have you been around anyone who has mentioned symptoms of Covid 19 within the past 7 to 10 days? no  Reviewed procedure/mask/visitor instructions, COVID-19 screening questions with patient.

## 2019-06-14 ENCOUNTER — Ambulatory Visit (HOSPITAL_COMMUNITY)
Admission: RE | Disposition: A | Discharge status: Home / Self Care | Payer: Self-pay | Source: Home / Self Care | Attending: Cardiovascular Disease

## 2019-06-14 ENCOUNTER — Ambulatory Visit (HOSPITAL_COMMUNITY)
Admission: RE | Admit: 2019-06-14 | Discharge: 2019-06-14 | Disposition: A | Discharge status: Home / Self Care | Payer: PPO | Attending: Cardiovascular Disease | Admitting: Cardiovascular Disease

## 2019-06-14 DIAGNOSIS — I25710 Atherosclerosis of autologous vein coronary artery bypass graft(s) with unstable angina pectoris: Secondary | ICD-10-CM

## 2019-06-14 DIAGNOSIS — E785 Hyperlipidemia, unspecified: Secondary | ICD-10-CM | POA: Diagnosis not present

## 2019-06-14 DIAGNOSIS — M797 Fibromyalgia: Secondary | ICD-10-CM | POA: Insufficient documentation

## 2019-06-14 DIAGNOSIS — Z7902 Long term (current) use of antithrombotics/antiplatelets: Secondary | ICD-10-CM | POA: Diagnosis not present

## 2019-06-14 DIAGNOSIS — Z955 Presence of coronary angioplasty implant and graft: Secondary | ICD-10-CM | POA: Insufficient documentation

## 2019-06-14 DIAGNOSIS — I252 Old myocardial infarction: Secondary | ICD-10-CM | POA: Diagnosis not present

## 2019-06-14 DIAGNOSIS — I25110 Atherosclerotic heart disease of native coronary artery with unstable angina pectoris: Secondary | ICD-10-CM | POA: Diagnosis not present

## 2019-06-14 DIAGNOSIS — I25708 Atherosclerosis of coronary artery bypass graft(s), unspecified, with other forms of angina pectoris: Secondary | ICD-10-CM

## 2019-06-14 DIAGNOSIS — Z87891 Personal history of nicotine dependence: Secondary | ICD-10-CM | POA: Diagnosis not present

## 2019-06-14 DIAGNOSIS — K219 Gastro-esophageal reflux disease without esophagitis: Secondary | ICD-10-CM | POA: Insufficient documentation

## 2019-06-14 DIAGNOSIS — I255 Ischemic cardiomyopathy: Secondary | ICD-10-CM | POA: Insufficient documentation

## 2019-06-14 DIAGNOSIS — Z8572 Personal history of non-Hodgkin lymphomas: Secondary | ICD-10-CM | POA: Insufficient documentation

## 2019-06-14 DIAGNOSIS — Z79899 Other long term (current) drug therapy: Secondary | ICD-10-CM | POA: Insufficient documentation

## 2019-06-14 DIAGNOSIS — Z951 Presence of aortocoronary bypass graft: Secondary | ICD-10-CM | POA: Diagnosis not present

## 2019-06-14 DIAGNOSIS — Z9861 Coronary angioplasty status: Secondary | ICD-10-CM

## 2019-06-14 DIAGNOSIS — I10 Essential (primary) hypertension: Secondary | ICD-10-CM | POA: Insufficient documentation

## 2019-06-14 HISTORY — PX: CORONARY BALLOON ANGIOPLASTY: CATH118233

## 2019-06-14 HISTORY — PX: LEFT HEART CATH AND CORS/GRAFTS ANGIOGRAPHY: CATH118250

## 2019-06-14 LAB — POCT ACTIVATED CLOTTING TIME
Activated Clotting Time: 230 seconds
Activated Clotting Time: 279 seconds

## 2019-06-14 SURGERY — LEFT HEART CATH AND CORS/GRAFTS ANGIOGRAPHY
Anesthesia: LOCAL

## 2019-06-14 MED ORDER — LIDOCAINE HCL (PF) 1 % IJ SOLN
INTRAMUSCULAR | Status: DC | PRN
  Administered 2019-06-14: 2 mL

## 2019-06-14 MED ORDER — HEPARIN SODIUM (PORCINE) 1000 UNIT/ML IJ SOLN
INTRAMUSCULAR | Status: AC
  Filled 2019-06-14: qty 1

## 2019-06-14 MED ORDER — HEPARIN (PORCINE) IN NACL 1000-0.9 UT/500ML-% IV SOLN
INTRAVENOUS | Status: DC | PRN
  Administered 2019-06-14 (×2): 500 mL

## 2019-06-14 MED ORDER — NITROGLYCERIN 0.4 MG SL SUBL
SUBLINGUAL_TABLET | SUBLINGUAL | Status: AC
  Filled 2019-06-14: qty 1

## 2019-06-14 MED ORDER — MORPHINE SULFATE (PF) 2 MG/ML IV SOLN
INTRAVENOUS | Status: AC
  Filled 2019-06-14: qty 1

## 2019-06-14 MED ORDER — MIDAZOLAM HCL 2 MG/2ML IJ SOLN
INTRAMUSCULAR | Status: DC | PRN
  Administered 2019-06-14 (×2): 1 mg via INTRAVENOUS

## 2019-06-14 MED ORDER — NITROGLYCERIN 1 MG/10 ML FOR IR/CATH LAB
INTRA_ARTERIAL | Status: AC
  Filled 2019-06-14: qty 10

## 2019-06-14 MED ORDER — FENTANYL CITRATE (PF) 100 MCG/2ML IJ SOLN
INTRAMUSCULAR | Status: DC | PRN
  Administered 2019-06-14: 50 ug via INTRAVENOUS
  Administered 2019-06-14 (×3): 25 ug via INTRAVENOUS

## 2019-06-14 MED ORDER — ACETAMINOPHEN 325 MG PO TABS: 650.0000 mg | ORAL_TABLET | ORAL | Status: DC | PRN

## 2019-06-14 MED ORDER — HYDRALAZINE HCL 20 MG/ML IJ SOLN: 10.0000 mg | INTRAMUSCULAR | Status: DC | PRN

## 2019-06-14 MED ORDER — IOHEXOL 350 MG/ML SOLN
INTRAVENOUS | Status: DC | PRN
  Administered 2019-06-14: 09:00:00 170 mL

## 2019-06-14 MED ORDER — ONDANSETRON HCL 4 MG/2ML IJ SOLN: 4.0000 mg | Freq: Four times a day (QID) | INTRAMUSCULAR | Status: DC | PRN

## 2019-06-14 MED ORDER — SODIUM CHLORIDE 0.9% FLUSH: 3.0000 mL | INTRAVENOUS | Status: DC | PRN

## 2019-06-14 MED ORDER — LIDOCAINE HCL (PF) 1 % IJ SOLN
INTRAMUSCULAR | Status: AC
  Filled 2019-06-14: qty 30

## 2019-06-14 MED ORDER — SODIUM CHLORIDE 0.9 % IV SOLN: 250.0000 mL | INTRAVENOUS | Status: DC | PRN

## 2019-06-14 MED ORDER — NITROGLYCERIN 0.4 MG SL SUBL: 0.4000 mg | SUBLINGUAL_TABLET | SUBLINGUAL | 3 refills | Status: DC | PRN

## 2019-06-14 MED ORDER — MORPHINE SULFATE (PF) 2 MG/ML IV SOLN
2.0000 mg | Freq: Once | INTRAVENOUS | Status: AC
  Administered 2019-06-14: 2 mg via INTRAVENOUS

## 2019-06-14 MED ORDER — ASPIRIN 81 MG PO CHEW: 81.0000 mg | CHEWABLE_TABLET | ORAL | Status: DC

## 2019-06-14 MED ORDER — LABETALOL HCL 5 MG/ML IV SOLN: 10.0000 mg | INTRAVENOUS | Status: DC | PRN

## 2019-06-14 MED ORDER — MIDAZOLAM HCL 2 MG/2ML IJ SOLN
INTRAMUSCULAR | Status: AC
  Filled 2019-06-14: qty 2

## 2019-06-14 MED ORDER — HEPARIN (PORCINE) IN NACL 1000-0.9 UT/500ML-% IV SOLN
INTRAVENOUS | Status: AC
  Filled 2019-06-14: qty 1000

## 2019-06-14 MED ORDER — SODIUM CHLORIDE 0.9% FLUSH: 3.0000 mL | Freq: Two times a day (BID) | INTRAVENOUS | Status: DC

## 2019-06-14 MED ORDER — VERAPAMIL HCL 2.5 MG/ML IV SOLN
INTRAVENOUS | Status: AC
  Filled 2019-06-14: qty 2

## 2019-06-14 MED ORDER — NITROGLYCERIN 0.4 MG SL SUBL
SUBLINGUAL_TABLET | SUBLINGUAL | Status: DC | PRN
  Administered 2019-06-14: .4 mg via SUBLINGUAL

## 2019-06-14 MED ORDER — VERAPAMIL HCL 2.5 MG/ML IV SOLN
INTRAVENOUS | Status: DC | PRN
  Administered 2019-06-14: 10 mL via INTRA_ARTERIAL

## 2019-06-14 MED ORDER — FENTANYL CITRATE (PF) 100 MCG/2ML IJ SOLN
INTRAMUSCULAR | Status: AC
  Filled 2019-06-14: qty 2

## 2019-06-14 MED ORDER — SODIUM CHLORIDE 0.9 % IV SOLN: INTRAVENOUS | Status: DC

## 2019-06-14 MED ORDER — HEPARIN SODIUM (PORCINE) 1000 UNIT/ML IJ SOLN
INTRAMUSCULAR | Status: DC | PRN
  Administered 2019-06-14: 4000 [IU] via INTRAVENOUS
  Administered 2019-06-14: 6000 [IU] via INTRAVENOUS
  Administered 2019-06-14: 4000 [IU] via INTRAVENOUS

## 2019-06-14 MED ORDER — IOHEXOL 350 MG/ML SOLN
INTRAVENOUS | Status: AC
  Filled 2019-06-14: qty 1

## 2019-06-14 MED ORDER — SODIUM CHLORIDE 0.9 % WEIGHT BASED INFUSION
3.0000 mL/kg/h | INTRAVENOUS | Status: AC
  Administered 2019-06-14: 3 mL/kg/h via INTRAVENOUS

## 2019-06-14 MED ORDER — SODIUM CHLORIDE 0.9 % WEIGHT BASED INFUSION: 1.0000 mL/kg/h | INTRAVENOUS | Status: DC

## 2019-06-14 SURGICAL SUPPLY — 21 items
BALLN SAPPHIRE 2.0X12 (BALLOONS) ×2
BALLN ~~LOC~~ EMERGE MR 2.0X12 (BALLOONS) ×2
BALLOON SAPPHIRE 2.0X12 (BALLOONS) IMPLANT
BALLOON ~~LOC~~ EMERGE MR 2.0X12 (BALLOONS) IMPLANT
CATH INFINITI 5 FR IM (CATHETERS) ×1 IMPLANT
CATH INFINITI 5FR MULTPACK ANG (CATHETERS) ×1 IMPLANT
CATH TELESCOPE 6F GEC (CATHETERS) ×1 IMPLANT
CATH VISTA GUIDE 6FR XB3 (CATHETERS) ×1 IMPLANT
DEVICE RAD COMP TR BAND LRG (VASCULAR PRODUCTS) ×1 IMPLANT
GLIDESHEATH SLEND SS 6F .021 (SHEATH) ×1 IMPLANT
GUIDEWIRE INQWIRE 1.5J.035X260 (WIRE) IMPLANT
INQWIRE 1.5J .035X260CM (WIRE) ×2
KIT ENCORE 26 ADVANTAGE (KITS) ×1 IMPLANT
KIT HEART LEFT (KITS) ×2 IMPLANT
PACK CARDIAC CATHETERIZATION (CUSTOM PROCEDURE TRAY) ×2 IMPLANT
SHIELD RADPAD SCOOP 12X17 (MISCELLANEOUS) ×1 IMPLANT
SYR MEDRAD MARK 7 150ML (SYRINGE) ×2 IMPLANT
TRANSDUCER W/STOPCOCK (MISCELLANEOUS) ×2 IMPLANT
TUBING CIL FLEX 10 FLL-RA (TUBING) ×2 IMPLANT
WIRE COUGAR XT STRL 190CM (WIRE) ×1 IMPLANT
WIRE HI TORQ WHISPER MS 190CM (WIRE) ×2 IMPLANT

## 2019-06-14 NOTE — Discharge Instructions (Signed)
Coronary Angioplasty, Care After This sheet gives you information about how to care for yourself after your procedure. Your health care provider may also give you more specific instructions. If you have problems or questions, contact your health care provider. What can I expect after the procedure? After your procedure, it is common to have:  Bruising at the catheter insertion site. This usually fades within 1-2 weeks.  Blood collecting in the tissue (hematoma) that may be painful to the touch. It should become smaller and less tender within 1-2 weeks. Follow these instructions at home: Medicines  Take over-the-counter and prescription medicines only as told by your health care provider.  Blood thinners may be prescribed after your procedure to improve blood flow. Bathing  You may shower 24-48 hours after the procedure or as told by your health care provider.  Do not take baths, swim, or use a hot tub until your health care provider approves. Insertion site care   Follow instructions from your health care provider about how to take care of your insertion site. Make sure you: ? Wash your hands with soap and water before you change your bandage (dressing). If soap and water are not available, use hand sanitizer. ? Change your dressing as told by your health care provider. ? Gently wash the site with plain soap and water. ? Use a clean towel to pat the area dry. ? Do not rub the site, because this may cause bleeding. ? Do not apply powder or lotion to the site.  Check your insertion site every day for signs of infection. Check for: ? More redness, swelling, or pain. ? More fluid or blood. ? Warmth. ? Pus or a bad smell. Lifestyle   Make any lifestyle changes as recommended by your health care provider. This may include: ? Not using any products that contain nicotine or tobacco, such as cigarettes and e-cigarettes. If you need help quitting, ask your health care  provider. ? Managing your weight. ? Getting regular exercise. ? Managing your blood pressure. ? Limiting your alcohol intake. ? Managing other health problems, such as diabetes.  Eat a heart-healthy diet. This should include plenty of fresh fruits and vegetables. Avoid foods that are: ? High in salt (sodium). ? Canned or highly processed. ? High in saturated fat or sugar. ? Fried. General instructions  Do not lift over 10 lb (4.5 kg) for 5 days after your procedure or as told by your health care provider.  Ask your health care provider when it is okay to: ? Return to work or school. ? Resume usual physical activities or sports. ? Resume sexual activity.  Keep all follow-up visits as told by your health care provider. This is important. Contact a health care provider if:  You have a fever.  You have chills.  You have increased bleeding from the insertion site. Hold pressure on the site. Get help right away if:  You develop chest pain or shortness of breath, feel faint, or pass out.  You have unusual pain at the insertion site.  You have redness, warmth, or swelling at the insertion site.  You have drainage (other than a small amount of blood on the dressing) from the insertion site.  The insertion site is bleeding, and the bleeding does not stop after 30 minutes of holding steady pressure on the site.  You develop bleeding from any other place, such as from the rectum. There may be bright red blood in your urine or stool, or it  may appear as black, tarry stool. This information is not intended to replace advice given to you by your health care provider. Make sure you discuss any questions you have with your health care provider. Document Revised: 01/08/2017 Document Reviewed: 09/01/2015 Elsevier Patient Education  2020 [employer]  

## 2019-06-14 NOTE — Discharge Summary (Signed)
Discharge Summary for Same Day PCI   Patient ID: Joseph Soto MRN: [id number]; DOB: [birth year]  Admit date: 06/14/2019 Discharge date: 06/14/2019  Primary Care Provider: [name], MD  Primary Cardiologist: Lauree Chandler, MD  Primary Electrophysiologist:  None   Discharge Diagnoses    Active Problems:   * No active hospital problems. *    Diagnostic Studies/Procedures    Cardiac Catheterization 06/14/2019:   Ost LM to LM lesion is 40% stenosed.  LM lesion is 40% stenosed.  Ost LAD to Prox LAD lesion is 90% stenosed.  Mid LAD lesion is 100% stenosed.  Ost Cx to Prox Cx lesion is 95% stenosed.  Previously placed Prox RCA drug eluting stent is widely patent.  Balloon angioplasty was performed.  Prox RCA to Mid RCA lesion is 40% stenosed.  Dist RCA-2 lesion is 50% stenosed.  Dist RCA-1 lesion is 50% stenosed.  Ost RPDA to RPDA lesion is 70% stenosed.  LIMA and is normal in caliber.  SVG.  Origin lesion is 100% stenosed.  SVG.  Origin to Prox Graft lesion before Dist RCA is 100% stenosed.  RPDA lesion is 70% stenosed.  RPAV lesion is 50% stenosed.  The left ventricular ejection fraction is 35-45% by visual estimate.  There is mild to moderate left ventricular systolic dysfunction.  Ost LM to Mid LM lesion is 50% stenosed.  Balloon angioplasty was performed.  Post intervention, there is a 70% residual stenosis.  Origin to Prox Graft lesion is 100% stenosed.  SVG graft was not injected.   1. Triple vessel CAD s/p 4V CABG with 1/4 patent bypass grafts 2. Chronic mid LAD occlusion with patent LIMA graft to the LAD. There is a moderate caliber Diagonal branch that fills antegrade.  3. Severe proximal Circumflex stenosis in a moderate caliber vessel. This has progressed in severity since his last cath. The vein graft to the intermediate branch is known to be occluded by the prior cath.  4. Patent proximal RCA stent. Moderate mid RCA  stenosis.  Moderately severe stenosis in the ostium of the PDA. The sequential vein graft to the PDA and PLA is known to be occluded by prior cath.  5. Moderate left main stenosis 6. Mild to moderate LV systolic dysfunction 7. Balloon angioplasty of the ostial Circumflex. Stenosis taken from 95% down to 70%.   Recommendations: Will continue medical management of CAD. His left main disease and proximal Circumflex disease is not amenable to percutaneous therapy. I do not think that his distal RCA branch disease is favorable for PCI either. If he continues to have dyspnea, may need to consider re-do bypass surgery.    _____________   History of Present Illness     Joseph Soto is a 74 y.o. male with PMH of CAD s/p CABG in 2007 with subsequent occlusion of SVG to diagonal and SVG to PDA/PLA in 2010 and PCI/DES to pRCA in 2018, ischemic cardiomyopathy (EF 40-45% 2018), HTN, HLD, and non-hodgkins lymphoma. Seen outpatient 06/07/19 by Dr. Angelena Form, at which time he reported progressive dyspnea, though no complaints of chest pain, palpitations, LE edema, orthopnea, PND, dizziness, lightheadedness, or syncope. Symptoms c/f unstable angina.  Cardiac catheterization was arranged for further evaluation.  Hospital Course     The patient underwent cardiac cath as noted above with triple vessel CAD s/p 4V CABG with 1/4 patent grafts (LIMA to LAD), with severe pLCx stenosis whic had progressed in severity since last cath - managed with balloon angioplasty, though not amenable to  stenting, nor was his dRCA branch disease favorable to PCI. Plan for DAPT with ASA/plavix indefinitely. Consider referral to [facility] if patient experiences refractory symptoms - may be a candidate for re-do CABG. There were no observed complications post cath. Radial cath site was re-evaluated prior to discharge and found to be stable without any complications. Instructions/precautions regarding cath site care were given prior to  discharge.  Joseph Soto was seen by Dr. Angelena Form and determined stable for discharge home. Follow up with our office has been arranged. Medications are listed below. No pertinent changes occurred at this visit.  _____________  Cath/PCI Registry Performance & Quality Measures: 1. Aspirin prescribed? - Yes 2. ADP Receptor Inhibitor (Plavix/Clopidogrel, Brilinta/Ticagrelor or Effient/Prasugrel) prescribed (includes medically managed patients)? - Yes 3. High Intensity Statin (Lipitor 40-80mg  or Crestor 20-40mg ) prescribed? - Yes 4. For EF <40%, was ACEI/ARB prescribed? - Not Applicable (EF >/= [id number]) 5. For EF <40%, Aldosterone Antagonist (Spironolactone or Eplerenone) prescribed? - Not Applicable (EF >/= [id number]) 6. Cardiac Rehab Phase II ordered (Included Medically managed Patients)? - Yes  _____________   Discharge Vitals Blood pressure (!) 137/59, pulse 67, temperature (!) 97.5 F (36.4 C), temperature source Oral, resp. rate 14, height 5\' 11"  (1.803 m), weight 83.9 kg, SpO2 99 %.  Filed Weights   06/14/19 0652  Weight: 83.9 kg    Last Labs & Radiologic Studies    CBC No results for input(s): WBC, NEUTROABS, HGB, HCT, MCV, PLT in the last 72 hours. Basic Metabolic Panel No results for input(s): NA, K, CL, CO2, GLUCOSE, BUN, CREATININE, CALCIUM, MG, PHOS in the last 72 hours. Liver Function Tests No results for input(s): AST, ALT, ALKPHOS, BILITOT, PROT, ALBUMIN in the last 72 hours. No results for input(s): LIPASE, AMYLASE in the last 72 hours. High Sensitivity Troponin:   No results for input(s): TROPONINIHS in the last 720 hours.  BNP Invalid input(s): POCBNP D-Dimer No results for input(s): DDIMER in the last 72 hours. Hemoglobin A1C No results for input(s): HGBA1C in the last 72 hours. Fasting Lipid Panel No results for input(s): CHOL, HDL, LDLCALC, TRIG, CHOLHDL, LDLDIRECT in the last 72 hours. Thyroid Function Tests No results for input(s): TSH, T4TOTAL, T3FREE,  THYROIDAB in the last 72 hours.  Invalid input(s): FREET3 _____________  CARDIAC CATHETERIZATION  Result Date: 06/14/2019  Ost LM to LM lesion is 40% stenosed.  LM lesion is 40% stenosed.  Ost LAD to Prox LAD lesion is 90% stenosed.  Mid LAD lesion is 100% stenosed.  Ost Cx to Prox Cx lesion is 95% stenosed.  Previously placed Prox RCA drug eluting stent is widely patent.  Balloon angioplasty was performed.  Prox RCA to Mid RCA lesion is 40% stenosed.  Dist RCA-2 lesion is 50% stenosed.  Dist RCA-1 lesion is 50% stenosed.  Ost RPDA to RPDA lesion is 70% stenosed.  LIMA and is normal in caliber.  SVG.  Origin lesion is 100% stenosed.  SVG.  Origin to Prox Graft lesion before Dist RCA is 100% stenosed.  RPDA lesion is 70% stenosed.  RPAV lesion is 50% stenosed.  The left ventricular ejection fraction is 35-45% by visual estimate.  There is mild to moderate left ventricular systolic dysfunction.  Ost LM to Mid LM lesion is 50% stenosed.  Balloon angioplasty was performed.  Post intervention, there is a 70% residual stenosis.  Origin to Prox Graft lesion is 100% stenosed.  SVG graft was not injected.  1. Triple vessel CAD s/p 4V CABG with 1/4 patent  bypass grafts 2. Chronic mid LAD occlusion with patent LIMA graft to the LAD. There is a moderate caliber Diagonal branch that fills antegrade. 3. Severe proximal Circumflex stenosis in a moderate caliber vessel. This has progressed in severity since his last cath. The vein graft to the intermediate branch is known to be occluded by the prior cath. 4. Patent proximal RCA stent. Moderate mid RCA stenosis.  Moderately severe stenosis in the ostium of the PDA. The sequential vein graft to the PDA and PLA is known to be occluded by prior cath. 5. Moderate left main stenosis 6. Mild to moderate LV systolic dysfunction 7. Balloon angioplasty of the ostial Circumflex. Stenosis taken from 95% down to 70%. Recommendations: Will continue medical  management of CAD. His left main disease and proximal Circumflex disease is not amenable to percutaneous therapy. I do not think that his distal RCA branch disease is favorable for PCI either. If he continues to have dyspnea, may need to consider re-do bypass surgery.    Disposition   Pt is being discharged home today in good condition.  Follow-up Plans & Appointments    Follow-up Information    Liliane Shi, PA-C Follow up on 06/23/2019.   Specialties: Cardiology, Physician Assistant Why: Please arrive 15 minutes early for your 9:15am post-catheterization cardiology follow-up appointment.  Contact information: [id number] [place] [address] [phone]          Discharge Instructions    AMB Referral to Cardiac Rehabilitation - Phase II   Complete by: As directed    Diagnosis: PTCA   After initial evaluation and assessments completed: Virtual Based Care may be provided alone or in conjunction with Phase 2 Cardiac Rehab based on patient barriers.: Yes   Diet - low sodium heart healthy   Complete by: As directed    Increase activity slowly   Complete by: As directed        Discharge Medications   Allergies as of 06/14/2019      Reactions   Niacin And Related Nausea And Vomiting   Niaspan [niacin Er] Nausea And Vomiting, Other (See Comments)   Stomach intolerance      Medication List    TAKE these medications   Ajovy 225 MG/1.5ML Soaj Generic drug: Fremanezumab-vfrm Inject 225 mg into the skin every 30 (thirty) days.   aspirin EC 81 MG tablet Take 81 mg by mouth daily.   cholecalciferol 1000 units tablet Commonly known as: VITAMIN D Take 1,000 Units by mouth in the morning and at bedtime.   clopidogrel 75 MG tablet Commonly known as: PLAVIX TAKE 1 TABLET (75 MG TOTAL) BY MOUTH DAILY WITH BREAKFAST. What changed: See the new instructions.   Coenzyme Q10 300 MG Caps Take 300 mg by mouth in the morning and at bedtime.     cyanocobalamin 2000 MCG tablet Take 2,000 mcg by mouth daily.   cyclobenzaprine 10 MG tablet Commonly known as: FLEXERIL Take 10 mg by mouth 2 (two) times daily.   ezetimibe 10 MG tablet Commonly known as: ZETIA TAKE 1 TABLET BY MOUTH EVERY DAY   L-Arginine 1000 MG Tabs Take 1,000 mg by mouth 2 (two) times daily.   metoprolol succinate 25 MG 24 hr tablet Commonly known as: TOPROL-XL TAKE 1 TABLET BY MOUTH DAILY. What changed:   how much to take  when to take this   naltrexone 50 MG tablet Commonly known as: DEPADE Take 50 mg by mouth 2 (two) times daily.  nitroGLYCERIN 0.4 MG SL tablet Commonly known as: NITROSTAT Place 1 tablet (0.4 mg total) under the tongue every 5 (five) minutes as needed for chest pain.   OVER THE COUNTER MEDICATION Take 450 mg by mouth in the morning and at bedtime. Cocoa   rosuvastatin 40 MG tablet Commonly known as: CRESTOR TAKE 1 TABLET BY MOUTH EVERY DAY   TURMERIC CURCUMIN PO Take 1,500 mg by mouth in the morning and at bedtime.   UNABLE TO FIND Take 2,000 mg by mouth in the morning and at bedtime. L-Arginine & L-Citriline          Allergies Allergies  Allergen Reactions  . Niacin And Related Nausea And Vomiting  . Niaspan [Niacin Er] Nausea And Vomiting and Other (See Comments)    Stomach intolerance    Outstanding Labs/Studies   None  Duration of Discharge Encounter   Greater than 30 minutes including physician time.  Signed, Abigail Butts, PA-C 06/14/2019, 1:01 PM

## 2019-06-14 NOTE — Interval H&P Note (Signed)
History and Physical Interval Note:  06/14/2019 7:33 AM  Joseph Soto  has presented today for surgery, with the diagnosis of cad.  The various methods of treatment have been discussed with the patient and family. After consideration of risks, benefits and other options for treatment, the patient has consented to  Procedure(s): LEFT HEART CATH AND CORS/GRAFTS ANGIOGRAPHY (N/A) as a surgical intervention.  The patient's history has been reviewed, patient examined, no change in status, stable for surgery.  I have reviewed the patient's chart and labs.  Questions were answered to the patient's satisfaction.    Cath Lab Visit (complete for each Cath Lab visit)  Clinical Evaluation Leading to the Procedure:   ACS: No.  Non-ACS:    Anginal Classification: CCS III  Anti-ischemic medical therapy: Minimal Therapy (1 class of medications)  Non-Invasive Test Results: No non-invasive testing performed  Prior CABG: Previous CABG        Lauree Chandler

## 2019-06-14 NOTE — Progress Notes (Signed)
[id number] Education competed with pt who voiced understanding. Discussed low sodium and heart healthy food choices. Gave walking instructions for ex and reviewed NTG use.  Pt attended CRP 2 [place] after CABG. Stated he is active and exercises on his own..  Not interested in attending program at  this time. Graylon Good RN BSN 06/14/2019 1:52 PM

## 2019-06-14 NOTE — Progress Notes (Signed)
C/O midsternal CP that goes up to his throat, pain in elbows to hands; c/o hands feeling cold. Dr. Angelena Form at bedside talking w/patient.

## 2019-06-19 ENCOUNTER — Telehealth: Payer: Self-pay

## 2019-06-19 NOTE — Telephone Encounter (Signed)
I spoke to the patient and moved his Friday appointment to Wednesday with Vin.Marland Kitchen  He wanted to be seen earlier.

## 2019-06-20 MED FILL — Nitroglycerin IV Soln 100 MCG/ML in D5W: INTRA_ARTERIAL | Qty: 10 | Status: AC

## 2019-06-21 ENCOUNTER — Other Ambulatory Visit: Payer: Self-pay

## 2019-06-21 ENCOUNTER — Encounter: Payer: Self-pay | Admitting: Physician Assistant

## 2019-06-21 ENCOUNTER — Ambulatory Visit: Payer: PPO | Admitting: Physician Assistant

## 2019-06-21 VITALS — BP 110/64 | HR 84 | Ht 71.0 in | Wt 190.8 lb

## 2019-06-21 DIAGNOSIS — I25110 Atherosclerotic heart disease of native coronary artery with unstable angina pectoris: Secondary | ICD-10-CM

## 2019-06-21 DIAGNOSIS — Z9861 Coronary angioplasty status: Secondary | ICD-10-CM | POA: Diagnosis not present

## 2019-06-21 DIAGNOSIS — I255 Ischemic cardiomyopathy: Secondary | ICD-10-CM

## 2019-06-21 DIAGNOSIS — E785 Hyperlipidemia, unspecified: Secondary | ICD-10-CM

## 2019-06-21 MED ORDER — ISOSORBIDE MONONITRATE ER 30 MG PO TB24
15.0000 mg | ORAL_TABLET | Freq: Every day | ORAL | 3 refills | Status: DC
Start: 2019-06-21 — End: 2020-05-02

## 2019-06-21 NOTE — Progress Notes (Signed)
Cardiology Office Note    Date:  06/21/2019   ID:  Joseph Soto, DOB 1945/09/28, MRN [id number]  PCP:  Joseph Battles, MD  Cardiologist:  Dr. Angelena Form  Chief Complaint: cath follow up   History of Present Illness:   Joseph Soto is a 74 y.o. male with hx of CAD s/p CABG, HTN, HLD, GERD, ICM with LVEF of 35-45% and non-hodgkins lymphoma presents for follow up.   Hx of CAD s/p CABG in 2007 with subsequent graftf failure of SVG to diagonal and SVG to PDA/PLA in 2010 and PCI/DES to [facility] in 2018. Last echo showed LVEF of 35-40% (Previously 40-45%).  Most recently seen by Dr. Angelena Form for progressive worsening of dyspnea and cath arranged which showed only 1/4 patent graft of LIMA to LAD. He underwent balloon angioplasty(not suitable for stenting)  of pLCX which has showed progression of disease since last cath. Other disease were not amenable to stenting as well. May consider re-do CABG if refractory angina.   Here today for follow up.  After his cath he had substernal chest pressure which felt like prior angina when he required CABG.  No recurrence since then.  Yesterday had sharp left-sided chest discomfort which was different.  Patient has not tried to do his routine exercise activity.  Patient reports he has problem initially get going.  No dizziness, palpitation, orthopnea, PND, syncope or melena.  He has mild intermittent lower extremity edema chronically.  Past Medical History:  Diagnosis Date  . Allergic rhinitis    grass, dust  . Buttock pain   . CAD (coronary artery disease)    Last cath 2010 per Dr. Doreatha Lew with diffuse multivessel disease, small caliber vessels not felt to be suitable for PCI  . Chronic left hip pain   . Colon polyps    pt says a colonoscopy did not confirm this, said there was nothing there  . Diverticulosis of sigmoid colon 2010   colonoscopy - [facility]  . Dizziness    1 episode   . Fibromyalgia   . GERD (gastroesophageal reflux disease)   .  Head pain    "not chronic; I have myofasial tightening in my head that causes pain in my skull" (06/17/2016)  . HLD (hyperlipidemia)   . Hypertension   . 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 Shon Baton office)  . Myeloma ([place])    "found it in my chest when they did the excision"  . Myocardial infarction ([place]) 06/2005  . Non Hodgkin's lymphoma ([place]) dx'd 06/2005    Past Surgical History:  Procedure Laterality Date  . BALLOON SINUPLASTY  2014  . CARDIAC CATHETERIZATION  ~ 2008   "Dr. Doreatha Lew"  . CATARACT EXTRACTION W/ INTRAOCULAR LENS IMPLANT Right   . colonscopy  2010  . CORONARY ANGIOPLASTY WITH STENT PLACEMENT  06/17/2016  . CORONARY ARTERY BYPASS GRAFT  5/07   x5. LV dysfunction.   . CORONARY BALLOON ANGIOPLASTY N/A 06/14/2019   Procedure: CORONARY BALLOON ANGIOPLASTY;  Surgeon: Joseph Blanks, MD;  Location: [place] [facility];  Service: Cardiovascular;  Laterality: N/A;  . CORONARY STENT INTERVENTION N/A 06/17/2016   Procedure: Coronary Stent Intervention;  Surgeon: Joseph Blanks, MD;  Location: [place] [facility];  Service: Cardiovascular;  Laterality: N/A;  . epidural steroid injections    . LAPAROSCOPIC CHOLECYSTECTOMY  1996  . LEFT HEART CATH AND CORS/GRAFTS ANGIOGRAPHY N/A 06/17/2016   Procedure: Left Heart Cath  and Cors/Grafts Angiography;  Surgeon: Joseph Blanks, MD;  Location: [place] [facility];  Service: Cardiovascular;  Laterality: N/A;  . LEFT HEART CATH AND CORS/GRAFTS ANGIOGRAPHY N/A 06/14/2019   Procedure: LEFT HEART CATH AND CORS/GRAFTS ANGIOGRAPHY;  Surgeon: Joseph Blanks, MD;  Location: [place] [facility];  Service: Cardiovascular;  Laterality: N/A;  . MASS EXCISION N/A 11/11/2015   Procedure: EXCISION OF MID CHEST  MASS;  Surgeon: Judeth Horn, MD;  Location: [place];  Service: General;  Laterality: N/A;  . MASS EXCISION N/A 12/23/2015   Procedure:  REEXCISION OF CHEST WALL TUMOR SITE;  Surgeon: Judeth Horn, MD;  Location: [place];  Service: General;  Laterality: N/A;  . POPLITEAL SYNOVIAL CYST EXCISION Right   . TONSILLECTOMY  1952   & Adenoidectomy    Current Medications: Prior to Admission medications   Medication Sig Start Date End Date Taking? Authorizing Provider  aspirin EC 81 MG tablet Take 81 mg by mouth daily.    [provider]  cholecalciferol (VITAMIN D) 1000 units tablet Take 1,000 Units by mouth in the morning and at bedtime.     [provider]  clopidogrel (PLAVIX) 75 MG tablet TAKE 1 TABLET (75 MG TOTAL) BY MOUTH DAILY WITH BREAKFAST. Patient taking differently: Take 75 mg by mouth daily with breakfast.  02/14/19   Joseph Blanks, MD  Coenzyme Q10 300 MG CAPS Take 300 mg by mouth in the morning and at bedtime.     [provider]  cyanocobalamin 2000 MCG tablet Take 2,000 mcg by mouth daily.    [provider]  cyclobenzaprine (FLEXERIL) 10 MG tablet Take 10 mg by mouth 2 (two) times daily.    [provider]  ezetimibe (ZETIA) 10 MG tablet TAKE 1 TABLET BY MOUTH EVERY DAY Patient taking differently: Take 10 mg by mouth daily.  04/13/19   Rosita Fire, Brittainy M, PA-C  Fremanezumab-vfrm (AJOVY) 225 MG/1.5ML SOAJ Inject 225 mg into the skin every 30 (thirty) days. 06/06/19   Melvenia Beam, MD  L-Arginine 1000 MG TABS Take 1,000 mg by mouth 2 (two) times daily.    [provider]  metoprolol succinate (TOPROL-XL) 25 MG 24 hr tablet TAKE 1 TABLET BY MOUTH DAILY. Patient taking differently: Take 12.5 mg by mouth in the morning and at bedtime.  03/24/19   Joseph Blanks, MD  naltrexone (DEPADE) 50 MG tablet Take 50 mg by mouth 2 (two) times daily.     [provider]  nitroGLYCERIN (NITROSTAT) 0.4 MG SL tablet Place 1 tablet (0.4 mg total) under the tongue every 5 (five) minutes as needed for chest pain. 06/14/19   Kroeger, Lorelee Cover.,  PA-C  OVER THE COUNTER MEDICATION Take 450 mg by mouth in the morning and at bedtime. Cocoa    [provider]  rosuvastatin (CRESTOR) 40 MG tablet TAKE 1 TABLET BY MOUTH EVERY DAY Patient taking differently: Take 40 mg by mouth daily.  05/12/19   Joseph Blanks, MD  TURMERIC CURCUMIN PO Take 1,500 mg by mouth in the morning and at bedtime.    [provider]  UNABLE TO FIND Take 2,000 mg by mouth in the morning and at bedtime. L-Arginine & L-Citriline     [provider]    Allergies:   Niacin and related and Niaspan [niacin er]   Social History   Socioeconomic History  . Marital status: Married    Spouse name: Joseph Soto  . Number  of children: 2  . Years of education: college  . Highest education Soto: Not on file  Occupational History  . Occupation: retired  Tobacco Use  . Smoking status: Former Smoker    Packs/day: 1.50    Years: 20.00    Pack years: 30.00    Types: Cigarettes  . Smokeless tobacco: Never Used  . Tobacco comment: "quit 06/2005"  Substance and Sexual Activity  . Alcohol use: No    Alcohol/week: 0.0 standard drinks  . Drug use: No  . Sexual activity: Not on file  Other Topics Concern  . Not on file  Social History Narrative   Married, 2 children.   Runs an executive recruiting company.    Left-handed.   Caffeine: iced tea, green tea; tea daily    Social Determinants of Health   Financial Resource Strain:   . Difficulty of Paying Living Expenses:   Food Insecurity:   . Worried About Charity fundraiser in the Last Year:   . Arboriculturist in the Last Year:   Transportation Needs:   . Film/video editor (Medical):   Joseph Kitchen Lack of Transportation (Non-Medical):   Physical Activity:   . Days of Exercise per Week:   . Minutes of Exercise per Session:   Stress:   . Feeling of Stress :   Social Connections:   . Frequency of Communication with Friends and Family:   . Frequency of Social Gatherings with Friends and  Family:   . Attends Religious Services:   . Active Member of Clubs or Organizations:   . Attends Archivist Meetings:   Joseph Kitchen Marital Status:      Family History:  The patient's family history includes Cancer in his paternal grandfather; Congestive Heart Failure in his father; Dementia in his mother; Heart attack in his father; Heart disease in his father.   ROS:   Please see the history of present illness.    ROS All other systems reviewed and are negative.   PHYSICAL EXAM:   VS:  BP 110/64   Pulse 84   Ht 5\' 11"  (1.803 m)   Wt 190 lb 12.8 oz (86.5 kg)   SpO2 98%   BMI 26.61 kg/m    GEN: Well nourished, well developed, in no acute distress  HEENT: normal  Neck: no JVD, carotid bruits, or masses Cardiac: RRR; no murmurs, rubs, or gallops,no edema  Respiratory:  clear to auscultation bilaterally, normal work of breathing GI: soft, nontender, nondistended, + BS MS: no deformity or atrophy  Skin: warm and dry, no rash Neuro:  Alert and Oriented x 3, Strength and sensation are intact Psych: euthymic mood, full affect  Wt Readings from Last 3 Encounters:  06/21/19 190 lb 12.8 oz (86.5 kg)  06/14/19 185 lb (83.9 kg)  06/07/19 193 lb (87.5 kg)      Studies/Labs Reviewed:   EKG:  EKG is ordered today.  The ekg ordered today demonstrates sinus rhythm at rate of 84 bpm  Recent Labs: 01/26/2019: ALT 32 06/07/2019: BUN 24; Creatinine, Ser 1.25; Hemoglobin 12.6; Platelets 298; Potassium 4.7; Sodium 137   Lipid Panel    Component Value Date/Time   CHOL 116 01/24/2018 0813   TRIG 90 01/24/2018 0813   HDL 46 01/24/2018 0813   CHOLHDL 2.5 01/24/2018 0813   CHOLHDL 4 05/02/2012 0752   VLDL 11.8 05/02/2012 0752   LDLCALC 52 01/24/2018 0813   LDLDIRECT 198.4 08/21/2010 0927    Additional studies/ records that  were reviewed today include:   CORONARY BALLOON ANGIOPLASTY   06/14/2019  LEFT HEART CATH AND CORS/GRAFTS ANGIOGRAPHY  Conclusion    Ost LM to LM lesion is  40% stenosed.  LM lesion is 40% stenosed.  Ost LAD to Prox LAD lesion is 90% stenosed.  Mid LAD lesion is 100% stenosed.  Ost Cx to Prox Cx lesion is 95% stenosed.  Previously placed Prox RCA drug eluting stent is widely patent.  Balloon angioplasty was performed.  Prox RCA to Mid RCA lesion is 40% stenosed.  Dist RCA-2 lesion is 50% stenosed.  Dist RCA-1 lesion is 50% stenosed.  Ost RPDA to RPDA lesion is 70% stenosed.  LIMA and is normal in caliber.  SVG.  Origin lesion is 100% stenosed.  SVG.  Origin to Prox Graft lesion before Dist RCA is 100% stenosed.  RPDA lesion is 70% stenosed.  RPAV lesion is 50% stenosed.  The left ventricular ejection fraction is 35-45% by visual estimate.  There is mild to moderate left ventricular systolic dysfunction.  Ost LM to Mid LM lesion is 50% stenosed.  Balloon angioplasty was performed.  Post intervention, there is a 70% residual stenosis.  Origin to Prox Graft lesion is 100% stenosed.  SVG graft was not injected.   1. Triple vessel CAD s/p 4V CABG with 1/4 patent bypass grafts 2. Chronic mid LAD occlusion with patent LIMA graft to the LAD. There is a moderate caliber Diagonal branch that fills antegrade.  3. Severe proximal Circumflex stenosis in a moderate caliber vessel. This has progressed in severity since his last cath. The vein graft to the intermediate branch is known to be occluded by the prior cath.  4. Patent proximal RCA stent. Moderate mid RCA stenosis.  Moderately severe stenosis in the ostium of the PDA. The sequential vein graft to the PDA and PLA is known to be occluded by prior cath.  5. Moderate left main stenosis 6. Mild to moderate LV systolic dysfunction 7. Balloon angioplasty of the ostial Circumflex. Stenosis taken from 95% down to 70%.   Recommendations: Will continue medical management of CAD. His left main disease and proximal Circumflex disease is not amenable to percutaneous therapy. I do  not think that his distal RCA branch disease is favorable for PCI either. If he continues to have dyspnea, may need to consider re-do bypass surgery.    Diagnostic Dominance: Right     Echo 10/2017 Study Conclusions   - Left ventricle: The cavity size was normal. Wall thickness was  increased in a pattern of mild LVH. Systolic function was  moderately reduced. The estimated ejection fraction was in the  range of 35% to 40%. Anteroseptal, inferoseptal, and anterior  hypokinesis. Doppler parameters are consistent with abnormal left  ventricular relaxation (grade 1 diastolic dysfunction).  - Aortic valve: There was no stenosis.  - Mitral valve: Mildly calcified annulus. There was trivial  regurgitation.  - Right ventricle: The cavity size was normal. Systolic function  was mildly reduced.  - Tricuspid valve: Peak RV-RA gradient (S): 27 mm Hg.  - Pulmonary arteries: PA peak pressure: 30 mm Hg (S).  - Inferior vena cava: The vessel was normal in size. The  respirophasic diameter changes were in the normal range (>= 50%),  consistent with normal central venous pressure.   Impressions:   - Normal LV size with mild LV hypertrophy. EF 35-40% with  anteroseptal, inferoseptal, and anterior hypokinesis. Normal RV  size with mildly decreased systolic function. No significant  valvular  abnormalities.   ASSESSMENT & PLAN:    1. CAD s/p CABG Recent cardiac catheterization last week as above.  S/p angioplasty only to ostial circumflex reducing 95% stenosis to 70%.  He had symptoms of angina post PCI which is resolved.  There is a consideration of redo CABG.  After long discussion patient willing to try low-dose Imdur.  Continue aspirin, Plavix, statin.,  Crestor and beta-blocker. -Patient reports recent study done in [place] regarding cyclodextrin which have shown to reduce cholesterol plaque.  FDA approved in [place] but not in [place].  Will foreard information to Dr.  Angelena Form as requested by patient.  2.  Hyperlipidemia No results found for requested labs within last 8760 hours.  -Continue Crestor and Zetia LDL 47 August 2020  3.  Ischemic cardiomyopathy - Last echo showed LVEF of 35-40% (Previously 40-45%). -Euvolemic -Continue Toprol-XL -Consider ACE or ARB in future  4. HTn - BP stable    Medication Adjustments/Labs and Tests Ordered: Current medicines are reviewed at length with the patient today.  Concerns regarding medicines are outlined above.  Medication changes, Labs and Tests ordered today are listed in the Patient Instructions below. Patient Instructions  Medication Instructions:  Your physician has recommended you make the following change in your medication:  1.  START Imdur 30 mg taking only 1/2 tablet daily   *If you need a refill on your cardiac medications before your next appointment, please call your pharmacy*   Lab Work: None ordered  If you have labs (blood work) drawn today and your tests are completely normal, you will receive your results only by: Joseph Kitchen MyChart Message (if you have MyChart) OR . A paper copy in the mail If you have any lab test that is abnormal or we need to change your treatment, we will call you to review the results.   Testing/Procedures: None ordered   Follow-Up: At [facility], you and your health needs are our priority.  As part of our continuing mission to provide you with exceptional heart care, we have created designated Provider Care Teams.  These Care Teams include your primary Cardiologist (physician) and Advanced Practice Providers (APPs -  Physician Assistants and Nurse Practitioners) who all work together to provide you with the care you need, when you need it.  We recommend signing up for the patient portal called "MyChart".  Sign up information is provided on this After Visit Summary.  MyChart is used to connect with patients for Virtual Visits (Telemedicine).  Patients are able  to view lab/test results, encounter notes, upcoming appointments, etc.  Non-urgent messages can be sent to your provider as well.   To learn more about what you can do with MyChart, go to [email or web address].    Your next appointment:   2 month(s)  The format for your next appointment:   In Person  Provider:   You may see Lauree Chandler, MD or one of the following Advanced Practice Providers on your designated Care Team:    Melina Copa, PA-C  Ermalinda Barrios, PA-C    Other Instructions      Signed, Leanor Kail, [place]  06/21/2019 [address], [place], [place]  [address] Phone: [phone]; Fax: [phone]

## 2019-06-21 NOTE — Patient Instructions (Signed)
Medication Instructions:  Your physician has recommended you make the following change in your medication:  1.  START Imdur 30 mg taking only 1/2 tablet daily   *If you need a refill on your cardiac medications before your next appointment, please call your pharmacy*   Lab Work: None ordered  If you have labs (blood work) drawn today and your tests are completely normal, you will receive your results only by: Marland Kitchen MyChart Message (if you have MyChart) OR . A paper copy in the mail If you have any lab test that is abnormal or we need to change your treatment, we will call you to review the results.   Testing/Procedures: None ordered   Follow-Up: At [facility], you and your health needs are our priority.  As part of our continuing mission to provide you with exceptional heart care, we have created designated Provider Care Teams.  These Care Teams include your primary Cardiologist (physician) and Advanced Practice Providers (APPs -  Physician Assistants and Nurse Practitioners) who all work together to provide you with the care you need, when you need it.  We recommend signing up for the patient portal called "MyChart".  Sign up information is provided on this After Visit Summary.  MyChart is used to connect with patients for Virtual Visits (Telemedicine).  Patients are able to view lab/test results, encounter notes, upcoming appointments, etc.  Non-urgent messages can be sent to your provider as well.   To learn more about what you can do with MyChart, go to [email or web address].    Your next appointment:   2 month(s)  The format for your next appointment:   In Person  Provider:   You may see Lauree Chandler, MD or one of the following Advanced Practice Providers on your designated Care Team:    Melina Copa, PA-C  Ermalinda Barrios, PA-C    Other Instructions

## 2019-06-23 ENCOUNTER — Ambulatory Visit: Payer: PPO | Admitting: Physician Assistant

## 2019-06-23 ENCOUNTER — Other Ambulatory Visit: Payer: Self-pay | Admitting: Medical

## 2019-06-23 DIAGNOSIS — I25708 Atherosclerosis of coronary artery bypass graft(s), unspecified, with other forms of angina pectoris: Secondary | ICD-10-CM

## 2019-06-26 ENCOUNTER — Other Ambulatory Visit: Payer: Self-pay | Admitting: Cardiovascular Disease

## 2019-06-28 ENCOUNTER — Encounter: Payer: Self-pay | Admitting: Physical Medicine and Rehabilitation

## 2019-06-28 ENCOUNTER — Ambulatory Visit (INDEPENDENT_AMBULATORY_CARE_PROVIDER_SITE_OTHER): Payer: PPO

## 2019-06-28 ENCOUNTER — Ambulatory Visit: Payer: PPO | Admitting: Physical Medicine and Rehabilitation

## 2019-06-28 ENCOUNTER — Other Ambulatory Visit: Payer: Self-pay

## 2019-06-28 VITALS — BP 118/69 | HR 82 | Ht 71.0 in | Wt 190.0 lb

## 2019-06-28 DIAGNOSIS — M545 Low back pain, unspecified: Secondary | ICD-10-CM

## 2019-06-28 DIAGNOSIS — M5481 Occipital neuralgia: Secondary | ICD-10-CM

## 2019-06-28 DIAGNOSIS — G8929 Other chronic pain: Secondary | ICD-10-CM

## 2019-06-28 DIAGNOSIS — M47816 Spondylosis without myelopathy or radiculopathy, lumbar region: Secondary | ICD-10-CM | POA: Diagnosis not present

## 2019-06-28 DIAGNOSIS — M7918 Myalgia, other site: Secondary | ICD-10-CM | POA: Diagnosis not present

## 2019-06-28 DIAGNOSIS — M542 Cervicalgia: Secondary | ICD-10-CM

## 2019-06-28 NOTE — Progress Notes (Signed)
Pt states pain started years ago and has gotten worse in the past few months. Pt states sleeping makes pain better. Sitting and moving around makes pain worse.  Pt states aches in the head. Pt states botox injections two weeks ago and cannot tell if they have worked or not. Holding head down makes pain worse. Nothing he has tried helped with pain.   .Numeric Pain Rating Scale and Functional Assessment Average Pain 7, 8 (head)  Pain Right Now 6, 0 (head) My pain is constant, sharp and dull Pain is worse with: walking, bending and some activites Pain improves with: rest   In the last MONTH (on 0-10 scale) has pain interfered with the following?  1. General activity like being  able to carry out your everyday physical activities such as walking, climbing stairs, carrying groceries, or moving a chair?  Rating(8)  2. Relation with others like being able to carry out your usual social activities and roles such as  activities at home, at work and in your community. Rating(8)  3. Enjoyment of life such that you have  been bothered by emotional problems such as feeling anxious, depressed or irritable?  Rating(6)

## 2019-06-29 ENCOUNTER — Encounter: Payer: Self-pay | Admitting: Physical Medicine and Rehabilitation

## 2019-06-29 NOTE — Progress Notes (Signed)
Joseph Soto - 74 y.o. male MRN [id number]  Date of birth: 12/14/1945  Office Visit Note: Visit Date: 06/28/2019 PCP: [name], MD Referred by: [name], MD  Subjective: Chief Complaint  Patient presents with  . Head - Pain   HPI: Joseph Soto is a 74 y.o. male who comes in today For evaluation and management of 2 chronic worsening problems with exacerbation as requested by Dr. Sarina Ill.  His first complaint really is chronic low back pain particularly in the left buttock region with some referral down into the lower pelvic region.  His second issue is chronic worsening occipital headache and upper cervical pain.  In terms of the low back, he reports many years of low back pain with no relief really with interventional procedures physical therapy chiropractic care medications.  Currently takes turmeric and Flexeril.  He carries a diagnosis of fibromyalgia.  I actually saw the patient in 2014 and we completed facet joint blocks at L4-5 with some relief of back pain but not buttock pain.  We went on to complete ischial bursa injection on the left again with temporary relief.  He went on to have a physical therapist in town complete pelvic PT with some type of internal release techniques that he said did help greatly on one occasion but was never able to repeat the relief.  This is been an ongoing issue with him for many years.  No specific injury.  He has had updated MRI of the lumbar spine since have seen him and this is reviewed with the patient today using spine models and imaging.  Is reviewed the note below.  He mainly has facet arthropathy of the lumbar spine with some mild lateral recess narrowing but no high-grade central stenosis no nerve compression.  Again he is tried all manner of conservative care and his pain is pretty constant it is worse with bending and walking better at rest.  Rates his pain as 7 out of 10.  No paresthesias down the legs.  No focal  weakness.  In terms of his head pain.  He reports approximately a year of occipital headache type pain with referral upwards and some referral into the upper cervical spine.  No radicular pain in the arms no paresthesias.  Again has tried just about everything without relief.  Dr. Jaynee Eagles recently completed Botox injections and he is not sure if that helps yet or not.  That was a couple of weeks ago.  I am unsure if he has had specific injections of the greater occipital nerves.  He has not had any advanced imaging or other imaging of the cervical spine.  I did get cervical spine x-ray today and this is reviewed with him.  He rates his pain as an 8 out of 10.  Review of Systems  Musculoskeletal: Positive for back pain, joint pain and neck pain.  All other systems reviewed and are negative.  Otherwise per HPI.  Assessment & Plan: Visit Diagnoses:  1. Cervicalgia   2. Bilateral occipital neuralgia   3. Myofascial pain syndrome   4. Spondylosis without myelopathy or radiculopathy, lumbar region   5. Chronic bilateral low back pain without sciatica     Plan: Findings:  1.  Chronic recalcitrant low back pain with particular referral on the left buttocks with pain referring down into the pelvic region really without pain down the legs.  This is been an ongoing problem and in fact I treated him in the past  with facet joint block which was beneficial to a degree for his back pain but not the buttock pain.  At this point he has not had epidural injection and new MRI does show some lateral recess narrowing at L3-4 and L4-5 and I think the right thing to try may be an S1 transforaminal epidural steroid injection.  He is on anticoagulant now that he has had some cardiovascular issues and actually had recent heart catheterization.  Depending on relief with epidural injection would look at sacroiliac joint injection versus piriformis injection.  These would be diagnostic to try to get a better understanding of  where this pain is coming from.  Otherwise he may wish to regroup with a different physical therapist although he has had multiple attempts at that.  2.  Upper cervical neck pain chronic and worsening exacerbation with occipital type headache status post Botox injection without much relief yet.  Other conservative care with dry needling etc. not been beneficial to a degree.  He does have myofascial trigger points on exam.  I think the first step because he does have facet arthritis at C2-3 is to try bilateral C2-3, third occipital nerve blocks and to see how much relief he gets.  His pain is fairly constant so he should be able to see a difference.  If it seems to help quite a bit diagnostically would look at ablation procedure.  If it does not seem to help with please consider trigger point injections as dry needling sometimes is very user dependent.  Otherwise he will continue care for his headache with Dr. Jaynee Eagles    Meds & Orders: No orders of the defined types were placed in this encounter.   Orders Placed This Encounter  Procedures  . XR Cervical Spine 2 or 3 views    Follow-up: Return for Return for planned interventional spine procedure.   Procedures: No procedures performed  No notes on file   Clinical History: [facility] [address], [place], [place] [address] [phone]  NEUROIMAGING REPORT    STUDY DATE: 12/29/2015 PATIENT NAME: Joseph Soto DOB: [birth year] MRN: [id number]  EXAM: MRI of the lumbar spine without contrast  ORDERING CLINICIAN: Marcial Pacas M.D. PhD CLINICAL HISTORY: 74 year old man with back pain and left-sided sciatica COMPARISON FILMS: 07/10/2012  TECHNIQUE: MRI of the lumbar spine was obtained utilizing 4 mm sagittal slices from 0000000 down to the lower sacrum with T1, T2 and inversion recovery views. In addition 4 mm axial slices from Q000111Q down to L5-S1 level were included with T1 and T2 weighted views. CONTRAST:  None IMAGING SITE: [facility], [address] [place], [place], [place]  FINDINGS: On sagittal images, the spine is imaged from T11 to the sacrum.   The conus medullaris and cauda equine appear normal.   The vertebral bodies are normally aligned.   The vertebral bodies have normal signal.    The discs and interspaces were further evaluated on axial views from L1 to S1 as follows:  L1-L2:  There is mild disc bulging, more to the right. The right neural foramen is minimally narrowed. There is no nerve root  compression.  L2-L3: There is broad disc bulging and facet hypertrophy. The neural foramina are mildly narrowed. There is no nerve root compression.  L3-L4:  There is broad disc protrusion and mild to moderate facet hypertrophy with mild ligamentum flavum hypertrophy. Both neural foramina are mildly narrowed and the lateral recess are moderately narrowed, more on the left. There does  not appear to be any nerve root compression.  L4-L5:   There is broad disc bulging and moderate facet hypertrophy with ligamentum flavum hypertrophy. The transverse diameter of the central canal is mildly narrowed but not enough to be considered spinal stenosis.   The neural foramina and lateral recesses are mildly narrowed but there is no nerve root compression.  L5-S1:   There is disc bulging more to the right and mild facet hypertrophy. The right lateral recess is mildly narrowed. The neural foramina are not significantly narrowed. There is no nerve root compression.  Compared to the MRI of the lumbar spine dated 07/10/2012, the disc protrusion at L3-L4 has slightly progressed   IMPRESSION:  This MRI of the lumbar spine without contrast shows the following: 1.   At L3-L4 there is broad disc protrusion and mild to moderate facet hypertrophy causing moderate bilateral narrowing of the lateral recesses, mildly more than left. This level has progressed slightly when compared to the MRI dated  07/10/2012. 2.   There is disc bulging, moderate facet hypertrophy and ligamentum flavum hypertrophy at L4-L5. There does not appear to be any nerve root compression at this level.    This level is stable when compared to the prior MRI. 3.    Moderate degenerative changes are noted at the other lumbar levels as detailed above, unchanged when compared to the previous MRI.      INTERPRETING PHYSICIAN:  Richard A. Felecia Shelling, MD, PhD Certified in  Neuroimaging by [facility] [employer]   He reports that he has quit smoking. His smoking use included cigarettes. He has a 30.00 pack-year smoking history. He has never used smokeless tobacco. No results for input(s): HGBA1C, LABURIC in the last 8760 hours.  Objective:  VS:  HT:5\' 11"  (180.3 cm)   WT:190 lb (86.2 kg)  BMI:26.51    BP:118/69  HR:82bpm  TEMP: ( )  RESP:  Physical Exam Vitals and nursing note reviewed.  Constitutional:      General: He is not in acute distress.    Appearance: He is well-developed.  HENT:     Head: Normocephalic and atraumatic.     Nose: Nose normal.     Mouth/Throat:     Mouth: Mucous membranes are moist.     Pharynx: Oropharynx is clear.  Eyes:     Conjunctiva/sclera: Conjunctivae normal.     Pupils: Pupils are equal, round, and reactive to light.  Neck:     Trachea: No tracheal deviation.  Cardiovascular:     Rate and Rhythm: Normal rate and regular rhythm.     Pulses: Normal pulses.  Pulmonary:     Effort: Pulmonary effort is normal.     Breath sounds: Normal breath sounds.  Abdominal:     General: There is no distension.     Palpations: Abdomen is soft.     Tenderness: There is no guarding or rebound.  Musculoskeletal:        General: No deformity.     Cervical back: Normal range of motion and neck supple. Tenderness present. No rigidity.     Right lower leg: No edema.     Left lower leg: No edema.     Comments: Patient sits with forward flexed cervical spine with increased  lordosis.  He has palpable trigger points in the upper paraspinal region as well as levator scapula rhomboid and trapezius bilaterally.  No real shoulder impingement sign.  He has negative Tinel's over the occipital nerves.  Examination of the  lumbar spine shows the patient arising from seated position without much difficulty but does have pain with extension and facet loading of the lower back.  He has a positive Fortin finger sign on the left.  He does not have much in the way of pain on the PSIS however except on the left.  No pain over the greater trochanters and no pain with hip rotation.  He actually is very flexible and has a negative Patrick's testing on both sides.  Skin:    General: Skin is warm and dry.     Findings: No erythema or rash.  Neurological:     General: No focal deficit present.     Mental Status: He is alert and oriented to person, place, and time.     Sensory: No sensory deficit.     Motor: No weakness or abnormal muscle tone.     Coordination: Coordination normal.     Gait: Gait normal.  Psychiatric:        Mood and Affect: Mood normal.        Behavior: Behavior normal.        Thought Content: Thought content normal.     Ortho Exam  Imaging: XR Cervical Spine 2 or 3 views  Result Date: 06/29/2019 AP and lateral cervical spine shows increased cervical lordosis with may be a very minimal retrolisthesis of C3 on C4 and mild degenerative disc height loss at C3-4 otherwise facet arthropathy noted at C2-3 and at see 6-7.  Normal AP alignment.   Past Medical/Family/Surgical/Social History: Medications & Allergies reviewed per EMR, new medications updated. Patient Active Problem List   Diagnosis Date Noted  . Chest pain 06/12/2017  . ARF (acute renal failure) ([facility]) 06/12/2017  . Unstable angina ([facility])   . 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 Left low back pain 12/02/2015  . Chronic headache 12/02/2015  . Dyspnea on exertion 10/30/2013  . Positional  vertigo 01/23/2013  . 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 CAD (coronary artery disease) 08/21/2010  . Ischemic cardiomyopathy 08/21/2010  . Hyperlipidemia 08/21/2010   Past Medical History:  Diagnosis Date  . Allergic rhinitis    grass, dust  . Buttock pain   . CAD (coronary artery disease)    Last cath 2010 per Dr. Doreatha Lew with diffuse multivessel disease, small caliber vessels not felt to be suitable for PCI  . Chronic left hip pain   . Colon polyps    pt says a colonoscopy did not confirm this, said there was nothing there  . Diverticulosis of sigmoid colon 2010   colonoscopy - [facility]  . Dizziness    1 episode   . Fibromyalgia   . GERD (gastroesophageal reflux disease)   . Head pain    "not chronic; I have myofasial tightening in my head that causes pain in my skull" (06/17/2016)  . HLD (hyperlipidemia)   . Hypertension   . 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 Shon Baton office)  . Myeloma ([facility])    "found it in my chest when they did the excision"  . Myocardial infarction ([facility]) 06/2005  . Non Hodgkin's lymphoma ([facility]) dx'd 06/2005   Family History  Problem Relation Age of Onset  . Heart disease Father   . Heart attack Father   . Congestive Heart Failure Father   . Dementia Mother   . Cancer Paternal Grandfather        ?  type   Past Surgical History:  Procedure Laterality Date  . BALLOON SINUPLASTY  2014  . CARDIAC CATHETERIZATION  ~ 2008   "Dr. Doreatha Lew"  . CATARACT EXTRACTION W/ INTRAOCULAR LENS IMPLANT Right   . colonscopy  2010  . CORONARY ANGIOPLASTY WITH STENT PLACEMENT  06/17/2016  . CORONARY ARTERY BYPASS GRAFT  5/07   x5. LV dysfunction.   . CORONARY BALLOON ANGIOPLASTY N/A 06/14/2019   Procedure: CORONARY BALLOON ANGIOPLASTY;  Surgeon: Burnell Blanks, MD;  Location: [facility];  Service: Cardiovascular;  Laterality: N/A;  .  CORONARY STENT INTERVENTION N/A 06/17/2016   Procedure: Coronary Stent Intervention;  Surgeon: Burnell Blanks, MD;  Location: [facility];  Service: Cardiovascular;  Laterality: N/A;  . epidural steroid injections    . LAPAROSCOPIC CHOLECYSTECTOMY  1996  . LEFT HEART CATH AND CORS/GRAFTS ANGIOGRAPHY N/A 06/17/2016   Procedure: Left Heart Cath and Cors/Grafts Angiography;  Surgeon: Burnell Blanks, MD;  Location: [facility];  Service: Cardiovascular;  Laterality: N/A;  . LEFT HEART CATH AND CORS/GRAFTS ANGIOGRAPHY N/A 06/14/2019   Procedure: LEFT HEART CATH AND CORS/GRAFTS ANGIOGRAPHY;  Surgeon: Burnell Blanks, MD;  Location: [facility];  Service: Cardiovascular;  Laterality: N/A;  . MASS EXCISION N/A 11/11/2015   Procedure: EXCISION OF MID CHEST  MASS;  Surgeon: Judeth Horn, MD;  Location: [facility];  Service: General;  Laterality: N/A;  . MASS EXCISION N/A 12/23/2015   Procedure: REEXCISION OF CHEST WALL TUMOR SITE;  Surgeon: Judeth Horn, MD;  Location: [facility];  Service: General;  Laterality: N/A;  . POPLITEAL SYNOVIAL CYST EXCISION Right   . TONSILLECTOMY  1952   & Adenoidectomy   Social History   Occupational History  . Occupation: retired  Tobacco Use  . Smoking status: Former Smoker    Packs/day: 1.50    Years: 20.00    Pack years: 30.00    Types: Cigarettes  . Smokeless tobacco: Never Used  . Tobacco comment: "quit 06/2005"  Substance and Sexual Activity  . Alcohol use: No    Alcohol/week: 0.0 standard drinks  . Drug use: No  . Sexual activity: Not on file

## 2019-07-06 ENCOUNTER — Ambulatory Visit (INDEPENDENT_AMBULATORY_CARE_PROVIDER_SITE_OTHER): Payer: PPO | Admitting: Physical Medicine and Rehabilitation

## 2019-07-06 ENCOUNTER — Other Ambulatory Visit: Payer: Self-pay

## 2019-07-06 ENCOUNTER — Ambulatory Visit: Payer: Self-pay

## 2019-07-06 VITALS — BP 120/70 | HR 93

## 2019-07-06 DIAGNOSIS — M5416 Radiculopathy, lumbar region: Secondary | ICD-10-CM | POA: Diagnosis not present

## 2019-07-06 MED ORDER — METHYLPREDNISOLONE ACETATE 80 MG/ML IJ SUSP: 40.0000 mg | Freq: Once | INTRAMUSCULAR | Status: DC

## 2019-07-06 NOTE — Progress Notes (Signed)
.  Numeric Pain Rating Scale and Functional Assessment Average Pain 7   In the last MONTH (on 0-10 scale) has pain interfered with the following?  1. General activity like being  able to carry out your everyday physical activities such as walking, climbing stairs, carrying groceries, or moving a chair?  Rating(7)   +Driver, -BT, -Dye Allergies.   

## 2019-07-09 ENCOUNTER — Encounter: Payer: Self-pay | Admitting: Physical Medicine and Rehabilitation

## 2019-07-11 NOTE — Progress Notes (Signed)
Joseph Soto - 74 y.o. male MRN [id number]  Date of birth: Jul 04, 1945  Office Visit Note: Visit Date: 07/06/2019 PCP: [name], MD Referred by: [name], MD  Subjective: Chief Complaint  Patient presents with  . Lower Back - Pain   HPI:  Joseph Soto is a 74 y.o. male who comes in today for planned Left S1-2 lumbar transforaminal epidural steroid injection with fluoroscopic guidance.  The patient has failed conservative care including home exercise, medications, time and activity modification.  This injection will be diagnostic and hopefully therapeutic.  Please see requesting physician notes for further details and justification.   ROS Otherwise per HPI.  Assessment & Plan: Visit Diagnoses:  1. Lumbar radiculopathy     Plan: No additional findings.   Meds & Orders:  Meds ordered this encounter  Medications  . methylPREDNISolone acetate (DEPO-MEDROL) injection 40 mg    Orders Placed This Encounter  Procedures  . XR C-ARM NO REPORT  . Epidural Steroid injection    Follow-up: Return if symptoms worsen or fail to improve.   Procedures: No procedures performed  No notes on file   Clinical History: [facility] [address] [place],  [address] [phone]  NEUROIMAGING REPORT    STUDY DATE: 12/29/2015 PATIENT NAME: Joseph Soto DOB: [birth year] MRN: [id number]  EXAM: MRI of the lumbar spine without contrast  ORDERING CLINICIAN: Marcial Pacas M.D. PhD CLINICAL HISTORY: 74 year old man with back pain and left-sided sciatica COMPARISON FILMS: 07/10/2012  TECHNIQUE: MRI of the lumbar spine was obtained utilizing 4 mm sagittal slices from 0000000 down to the lower sacrum with T1, T2 and inversion recovery views. In addition 4 mm axial slices from Q000111Q down to L5-S1 level were included with T1 and T2 weighted views. CONTRAST: None IMAGING SITE: [facility], [address] [place], [place],  [place]  FINDINGS: On sagittal images, the spine is imaged from T11 to the sacrum.   The conus medullaris and cauda equine appear normal.   The vertebral bodies are normally aligned.   The vertebral bodies have normal signal.    The discs and interspaces were further evaluated on axial views from L1 to S1 as follows:  L1-L2:  There is mild disc bulging, more to the right. The right neural foramen is minimally narrowed. There is no nerve root  compression.  L2-L3: There is broad disc bulging and facet hypertrophy. The neural foramina are mildly narrowed. There is no nerve root compression.  L3-L4:  There is broad disc protrusion and mild to moderate facet hypertrophy with mild ligamentum flavum hypertrophy. Both neural foramina are mildly narrowed and the lateral recess are moderately narrowed, more on the left. There does not appear to be any nerve root compression.  L4-L5:   There is broad disc bulging and moderate facet hypertrophy with ligamentum flavum hypertrophy. The transverse diameter of the central canal is mildly narrowed but not enough to be considered spinal stenosis.   The neural foramina and lateral recesses are mildly narrowed but there is no nerve root compression.  L5-S1:   There is disc bulging more to the right and mild facet hypertrophy. The right lateral recess is mildly narrowed. The neural foramina are not significantly narrowed. There is no nerve root compression.  Compared to the MRI of the lumbar spine dated 07/10/2012, the disc protrusion at L3-L4 has slightly progressed   IMPRESSION:  This MRI of the lumbar spine without contrast shows the following: 1.   At L3-L4  there is broad disc protrusion and mild to moderate facet hypertrophy causing moderate bilateral narrowing of the lateral recesses, mildly more than left. This level has progressed slightly when compared to the MRI dated 07/10/2012. 2.   There is disc bulging, moderate facet hypertrophy and ligamentum  flavum hypertrophy at L4-L5. There does not appear to be any nerve root compression at this level.    This level is stable when compared to the prior MRI. 3.    Moderate degenerative changes are noted at the other lumbar levels as detailed above, unchanged when compared to the previous MRI.      INTERPRETING PHYSICIAN:  Richard A. Felecia Shelling, MD, PhD Certified in  Neuroimaging by [facility] [employer]     Objective:  VS:  HT:    WT:   BMI:     BP:120/70  HR:93bpm  TEMP: ( )  RESP:  Physical Exam Constitutional:      General: He is not in acute distress.    Appearance: Normal appearance. He is not ill-appearing.  HENT:     Head: Normocephalic and atraumatic.     Right Ear: External ear normal.     Left Ear: External ear normal.  Eyes:     Extraocular Movements: Extraocular movements intact.  Cardiovascular:     Rate and Rhythm: Normal rate.     Pulses: Normal pulses.  Abdominal:     General: There is no distension.     Palpations: Abdomen is soft.  Musculoskeletal:        General: No tenderness or signs of injury.     Right lower leg: No edema.     Left lower leg: No edema.     Comments: Patient has good distal strength without clonus.  Skin:    Findings: No erythema or rash.  Neurological:     General: No focal deficit present.     Mental Status: He is alert and oriented to person, place, and time.     Sensory: No sensory deficit.     Motor: No weakness or abnormal muscle tone.     Coordination: Coordination normal.  Psychiatric:        Mood and Affect: Mood normal.        Behavior: Behavior normal.      Imaging: No results found.

## 2019-07-11 NOTE — Procedures (Signed)
S1 Lumbosacral Transforaminal Epidural Steroid Injection - Sub-Pedicular Approach with Fluoroscopic Guidance   Patient: Joseph Soto      Date of Birth: 1945/03/28 MRN: [id number] PCP: [name], MD      Visit Date: 07/06/2019   Universal Protocol:    Date/Time: 06/01/215:41 AM  Consent Given By: the patient  Position:  PRONE  Additional Comments: Vital signs were monitored before and after the procedure. Patient was prepped and draped in the usual sterile fashion. The correct patient, procedure, and site was verified.   Injection Procedure Details:  Procedure Site One Meds Administered:  Meds ordered this encounter  Medications  . methylPREDNISolone acetate (DEPO-MEDROL) injection 40 mg    Laterality: Left  Location/Site:  S1 Foramen   Needle size: 22 ga.  Needle type: Spinal  Needle Placement: Transforaminal  Findings:   -Comments: Excellent flow of contrast along the nerve and into the epidural space.  Epidurogram: Contrast epidurogram showed no nerve root cut off or restricted flow pattern.  Procedure Details: After squaring off the sacral end-plate to get a true AP view, the C-arm was positioned so that the best possible view of the S1 foramen was visualized. The soft tissues overlying this structure were infiltrated with 2-3 ml. of 1% Lidocaine without Epinephrine.    The spinal needle was inserted toward the target using a "trajectory" view along the fluoroscope beam.  Under AP and lateral visualization, the needle was advanced so it did not puncture dura. Biplanar projections were used to confirm position. Aspiration was confirmed to be negative for CSF and/or blood. A 1-2 ml. volume of Isovue-250 was injected and flow of contrast was noted at each level. Radiographs were obtained for documentation purposes.   After attaining the desired flow of contrast documented above, a 0.5 to 1.0 ml test dose of 0.25% Marcaine was injected into each respective  transforaminal space.  The patient was observed for 90 seconds post injection.  After no sensory deficits were reported, and normal lower extremity motor function was noted,   the above injectate was administered so that equal amounts of the injectate were placed at each foramen (level) into the transforaminal epidural space.   Additional Comments:  The patient tolerated the procedure well Dressing: Band-Aid with 2 x 2 sterile gauze    Post-procedure details: Patient was observed during the procedure. Post-procedure instructions were reviewed.  Patient left the clinic in stable condition.

## 2019-07-12 ENCOUNTER — Other Ambulatory Visit: Payer: Self-pay

## 2019-07-12 ENCOUNTER — Ambulatory Visit: Payer: Self-pay

## 2019-07-12 ENCOUNTER — Encounter: Payer: Self-pay | Admitting: Physical Medicine and Rehabilitation

## 2019-07-12 ENCOUNTER — Ambulatory Visit: Payer: PPO | Admitting: Physical Medicine and Rehabilitation

## 2019-07-12 VITALS — BP 123/76 | HR 85

## 2019-07-12 DIAGNOSIS — M25552 Pain in left hip: Secondary | ICD-10-CM

## 2019-07-12 DIAGNOSIS — M542 Cervicalgia: Secondary | ICD-10-CM

## 2019-07-12 DIAGNOSIS — M47816 Spondylosis without myelopathy or radiculopathy, lumbar region: Secondary | ICD-10-CM | POA: Diagnosis not present

## 2019-07-12 DIAGNOSIS — M545 Low back pain, unspecified: Secondary | ICD-10-CM

## 2019-07-12 DIAGNOSIS — G5702 Lesion of sciatic nerve, left lower limb: Secondary | ICD-10-CM | POA: Diagnosis not present

## 2019-07-12 DIAGNOSIS — M47812 Spondylosis without myelopathy or radiculopathy, cervical region: Secondary | ICD-10-CM | POA: Diagnosis not present

## 2019-07-12 MED ORDER — METHYLPREDNISOLONE ACETATE 80 MG/ML IJ SUSP
40.0000 mg | Freq: Once | INTRAMUSCULAR | Status: AC
  Administered 2019-07-12: 40 mg

## 2019-07-12 NOTE — Progress Notes (Signed)
Numeric Pain Rating Scale and Functional Assessment Average Pain 4        In the last MONTH (on 0-10 scale) has pain interfered with the following?  1. General activity like being  able to carry out your everyday physical activities such as walking, climbing stairs, carrying groceries, or moving a chair?  Rating(4)   Pain is right above the neck and may radiate into his neck. Okay when turning his head right to left or up/down.   +Driver,+-BT-Plavix, -Dye Allergies.

## 2019-07-13 NOTE — Procedures (Signed)
Diagnostic Cervical Facet Joint Nerve Block with Fluoroscopic Guidance  Patient: Joseph Soto      Date of Birth: February 17, 1945 MRN: [id number] PCP: [name], MD      Visit Date: 07/12/2019   Universal Protocol:    Date/Time: 06/03/216:10 AM  Consent Given By: the patient  Position: PRONE  Additional Comments: Vital signs were monitored before and after the procedure. Patient was prepped and draped in the usual sterile fashion. The correct patient, procedure, and site was verified.   Injection Procedure Details:  Procedure Site One Meds Administered:  Meds ordered this encounter  Medications  . methylPREDNISolone acetate (DEPO-MEDROL) injection 40 mg     Laterality: Bilateral  Location/Site:  C2-3  Needle size: 25 G  Needle type: Spinal  Needle Placement: Articular Pillar  Findings:  -  -Comments: Excellent flow of contrast across the articular pillars without intravascular flow  Procedure Details: The fluoroscope beam was positioned to square off the endplates of the desired vertebral level to achieve a true AP position. The beam was then moved in a small "counter" oblique to the contralateral side with a small amount of caudal tilt to achieve a trajectory alignment with the desired nerves.  For each target described below the skin was anesthetized with 1 ml of 1% Lidocaine without epinephrine.   To block the facet joint nerve to C2, the needle was fluoroscopically positioned over the inferior lateral portion of the C2/3 facet joint nerve where the third occipital nerve (TON) lies.  After contact with periosteum and negative aspirate for blood and CSF, correct placement without intravascular or epidural spread was confirmed by Bi-planar images and  injecting 0.5 ml. of Omnipaque-240.  A spot radiograph was obtained of this image.  Next, a 0.5 ml. volume of 1% Lidocaine without Epinephrine was then injected.  Prior to the procedure, the patient was given a  Pain Diary which was completed for baseline measurements.  After the procedure, the patient rated their pain every 30 minutes and will continue rating at this frequency for a total of 5 hours.  The patient has been asked to complete the Diary and return to [place] by mail, fax or hand delivered as soon as possible.   Additional Comments:  The patient tolerated the procedure well Dressing: Band-Aid    Post-procedure details: Patient was observed during the procedure. Post-procedure instructions were reviewed.  Patient left the clinic in stable condition.

## 2019-07-13 NOTE — Progress Notes (Signed)
Joseph Soto - 74 y.o. male MRN [id number]  Date of birth: Jul 14, 1945  Office Visit Note: Visit Date: 07/12/2019 PCP: [name], MD Referred by: [name], MD  Subjective: Chief Complaint  Patient presents with  . Neck - Follow-up    ESI   HPI: Joseph Soto is a 74 y.o. male who comes in today for planned Bilateral C2-3 cervical facet/medial branch block with fluoroscopic guidance.  The patient has failed conservative care including home exercise, medications, time and activity modification.  This injection will be diagnostic and hopefully therapeutic.  Please see my notes for further details and justification.  Exam shows concordant neck pain with facet joint loading and extension.   Secondarily, the patient was recently seen for evaluation management of low back and left buttock pain.  He underwent left S1 transforaminal steroid injection without really any relief of his left buttock pain this left buttock pain has been really recalcitrant to all manner of therapy and massage and medication and injection.  He has not had piriformis injection or sacroiliac joint injection that I am aware of.  He tells me that after the injection even though he did not feel much relief he had a new symptom that just started which was acute low back pain when he went from a forward flexion to extension he felt a mechanical complaint across his back.  Worse with standing better at rest.  No radicular complaints from that.  He reports his wrist really never had that before.  I reviewed his MRI with him again today and he does have facet arthropathy of the lower levels particular at L4-5.  Again no bilateral radicular complaints but he still has this left buttock pain.  No focal weakness.  No other red flag complaints or specific injury.    Review of Systems  Musculoskeletal: Positive for back pain, joint pain and neck pain.  All other systems reviewed and are negative.  Otherwise per  HPI.  Assessment & Plan: Visit Diagnoses:  1. Cervical spondylosis without myelopathy   2. Cervicalgia   3. Spondylosis without myelopathy or radiculopathy, lumbar region   4. Acute bilateral low back pain without sciatica   5. Pain in left hip   6. Piriformis syndrome of left side     Plan: Findings:  1.  Upper cervical and lower occipital pain evaluated ulnar evaluation recently we will go ahead and complete bilateral C2-3 facet blocks today with goal towards potential for radiofrequency ablation.  He has had therapy and dry needling in this area before and he has had conservative care for many years.  2.  Acute onset of low back pain worse with going from flexion to extension and consistent with facet mediated pain on exam.  He does have history of fibromyalgia which undercuts all these diagnoses and his pain level.  He has myofascial pain with trigger points as well.  He has this recalcitrant left hip pain.  For his left buttock pain which was not resolved with epidural injection we will refer him to physical therapy here in the office for dry needling and work with the piriformis or anything that may help.  I will add to that his low back pain to see if they can work with this from core strengthening and mechanical evaluation.  If he does not get much relief with his back pain could entertain the idea of diagnostic medial branch blocks of the facet joints versus need for possible MRI although he really  does not have any red flag complaints and it really just started.    Meds & Orders:  Meds ordered this encounter  Medications  . methylPREDNISolone acetate (DEPO-MEDROL) injection 40 mg    Orders Placed This Encounter  Procedures  . Facet Injection  . XR C-ARM NO REPORT  . Ambulatory referral to Physical Therapy    Follow-up: Return if symptoms worsen or fail to improve.   Procedures: No procedures performed  Diagnostic Cervical Facet Joint Nerve Block with Fluoroscopic  Guidance  Patient: Joseph Soto      Date of Birth: 02-Feb-1946 MRN: [id number] PCP: [name], MD      Visit Date: 07/12/2019   Universal Protocol:    Date/Time: 06/03/216:10 AM  Consent Given By: the patient  Position: PRONE  Additional Comments: Vital signs were monitored before and after the procedure. Patient was prepped and draped in the usual sterile fashion. The correct patient, procedure, and site was verified.   Injection Procedure Details:  Procedure Site One Meds Administered:  Meds ordered this encounter  Medications  . methylPREDNISolone acetate (DEPO-MEDROL) injection 40 mg     Laterality: Bilateral  Location/Site:  C2-3  Needle size: 25 G  Needle type: Spinal  Needle Placement: Articular Pillar  Findings:  -  -Comments: Excellent flow of contrast across the articular pillars without intravascular flow  Procedure Details: The fluoroscope beam was positioned to square off the endplates of the desired vertebral level to achieve a true AP position. The beam was then moved in a small "counter" oblique to the contralateral side with a small amount of caudal tilt to achieve a trajectory alignment with the desired nerves.  For each target described below the skin was anesthetized with 1 ml of 1% Lidocaine without epinephrine.   To block the facet joint nerve to C2, the needle was fluoroscopically positioned over the inferior lateral portion of the C2/3 facet joint nerve where the third occipital nerve (TON) lies.  After contact with periosteum and negative aspirate for blood and CSF, correct placement without intravascular or epidural spread was confirmed by Bi-planar images and  injecting 0.5 ml. of Omnipaque-240.  A spot radiograph was obtained of this image.  Next, a 0.5 ml. volume of 1% Lidocaine without Epinephrine was then injected.  Prior to the procedure, the patient was given a Pain Diary which was completed for baseline measurements.  After  the procedure, the patient rated their pain every 30 minutes and will continue rating at this frequency for a total of 5 hours.  The patient has been asked to complete the Diary and return to [place] by mail, fax or hand delivered as soon as possible.   Additional Comments:  The patient tolerated the procedure well Dressing: Band-Aid    Post-procedure details: Patient was observed during the procedure. Post-procedure instructions were reviewed.  Patient left the clinic in stable condition.       Clinical History: [facility] [address] [place], [place] [address] [phone]  NEUROIMAGING REPORT    STUDY DATE: 12/29/2015 PATIENT NAME: Joseph Soto DOB: 1945/09/28 MRN: [id number]  EXAM: MRI of the lumbar spine without contrast  ORDERING CLINICIAN: Marcial Pacas M.D. PhD CLINICAL HISTORY: 74 year old man with back pain and left-sided sciatica COMPARISON FILMS: 07/10/2012  TECHNIQUE: MRI of the lumbar spine was obtained utilizing 4 mm sagittal slices from 0000000 down to the lower sacrum with T1, T2 and inversion recovery views. In addition 4 mm axial slices from Q000111Q down  to L5-S1 level were included with T1 and T2 weighted views. CONTRAST: None IMAGING SITE: [facility], [address] [place], [place], [place]  FINDINGS: On sagittal images, the spine is imaged from T11 to the sacrum.   The conus medullaris and cauda equine appear normal.   The vertebral bodies are normally aligned.   The vertebral bodies have normal signal.    The discs and interspaces were further evaluated on axial views from L1 to S1 as follows:  L1-L2:  There is mild disc bulging, more to the right. The right neural foramen is minimally narrowed. There is no nerve root  compression.  L2-L3: There is broad disc bulging and facet hypertrophy. The neural foramina are mildly narrowed. There is no nerve root compression.  L3-L4:  There is broad disc protrusion and mild  to moderate facet hypertrophy with mild ligamentum flavum hypertrophy. Both neural foramina are mildly narrowed and the lateral recess are moderately narrowed, more on the left. There does not appear to be any nerve root compression.  L4-L5:   There is broad disc bulging and moderate facet hypertrophy with ligamentum flavum hypertrophy. The transverse diameter of the central canal is mildly narrowed but not enough to be considered spinal stenosis.   The neural foramina and lateral recesses are mildly narrowed but there is no nerve root compression.  L5-S1:   There is disc bulging more to the right and mild facet hypertrophy. The right lateral recess is mildly narrowed. The neural foramina are not significantly narrowed. There is no nerve root compression.  Compared to the MRI of the lumbar spine dated 07/10/2012, the disc protrusion at L3-L4 has slightly progressed   IMPRESSION:  This MRI of the lumbar spine without contrast shows the following: 1.   At L3-L4 there is broad disc protrusion and mild to moderate facet hypertrophy causing moderate bilateral narrowing of the lateral recesses, mildly more than left. This level has progressed slightly when compared to the MRI dated 07/10/2012. 2.   There is disc bulging, moderate facet hypertrophy and ligamentum flavum hypertrophy at L4-L5. There does not appear to be any nerve root compression at this level.    This level is stable when compared to the prior MRI. 3.    Moderate degenerative changes are noted at the other lumbar levels as detailed above, unchanged when compared to the previous MRI.      INTERPRETING PHYSICIAN:  Richard A. Felecia Shelling, MD, PhD Certified in  Neuroimaging by [employer]   He reports that he has quit smoking. His smoking use included cigarettes. He has a 30.00 pack-year smoking history. He has never used smokeless tobacco. No results for input(s): HGBA1C, LABURIC in the last 8760  hours.  Objective:  VS:  HT:    WT:   BMI:     BP:123/76  HR:85bpm  TEMP: ( )  RESP:95 % Physical Exam Constitutional:      General: He is not in acute distress.    Appearance: Normal appearance. He is not ill-appearing.  HENT:     Head: Normocephalic and atraumatic.     Right Ear: External ear normal.     Left Ear: External ear normal.  Eyes:     Extraocular Movements: Extraocular movements intact.  Cardiovascular:     Rate and Rhythm: Normal rate.     Pulses: Normal pulses.  Abdominal:     General: There is no distension.     Palpations: Abdomen is soft.  Musculoskeletal:  General: No tenderness or signs of injury.     Right lower leg: No edema.     Left lower leg: No edema.     Comments: Patient has good distal strength without clonus.  He has concordant neck pain with facet loading extension and rotation.  He has concordant low back pain with facet loading and extension.  He has multiple tender points but also some trigger points.  Skin:    Findings: No erythema or rash.  Neurological:     General: No focal deficit present.     Mental Status: He is alert and oriented to person, place, and time.     Sensory: No sensory deficit.     Motor: No weakness or abnormal muscle tone.     Coordination: Coordination normal.  Psychiatric:        Mood and Affect: Mood normal.        Behavior: Behavior normal.     Ortho Exam  Imaging: XR C-ARM NO REPORT  Result Date: 07/12/2019 Please see Notes tab for imaging impression.   Past Medical/Family/Surgical/Social History: Medications & Allergies reviewed per EMR, new medications updated. Patient Active Problem List   Diagnosis Date Noted  . Chest pain 06/12/2017  . ARF (acute renal failure) ([place]) 06/12/2017  . Unstable angina ([place])   . 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 Left low back pain 12/02/2015  . Chronic headache 12/02/2015  . Dyspnea on exertion 10/30/2013  . Positional vertigo 01/23/2013  .  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 CAD (coronary artery disease) 08/21/2010  . Ischemic cardiomyopathy 08/21/2010  . Hyperlipidemia 08/21/2010   Past Medical History:  Diagnosis Date  . Allergic rhinitis    grass, dust  . Buttock pain   . CAD (coronary artery disease)    Last cath 2010 per Dr. Doreatha Lew with diffuse multivessel disease, small caliber vessels not felt to be suitable for PCI  . Chronic left hip pain   . Colon polyps    pt says a colonoscopy did not confirm this, said there was nothing there  . Diverticulosis of sigmoid colon 2010   colonoscopy - [facility]  . Dizziness    1 episode   . Fibromyalgia   . GERD (gastroesophageal reflux disease)   . Head pain    "not chronic; I have myofasial tightening in my head that causes pain in my skull" (06/17/2016)  . HLD (hyperlipidemia)   . Hypertension   . 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 Shon Baton office)  . Myeloma ([place])    "found it in my chest when they did the excision"  . Myocardial infarction ([place]) 06/2005  . Non Hodgkin's lymphoma ([place]) dx'd 06/2005   Family History  Problem Relation Age of Onset  . Heart disease Father   . Heart attack Father   . Congestive Heart Failure Father   . Dementia Mother   . Cancer Paternal Grandfather        ? type   Past Surgical History:  Procedure Laterality Date  . BALLOON SINUPLASTY  2014  . CARDIAC CATHETERIZATION  ~ 2008   "Dr. Doreatha Lew"  . CATARACT EXTRACTION W/ INTRAOCULAR LENS IMPLANT Right   . colonscopy  2010  . CORONARY ANGIOPLASTY WITH STENT PLACEMENT  06/17/2016  . CORONARY ARTERY BYPASS GRAFT  5/07   x5. LV dysfunction.   . CORONARY BALLOON ANGIOPLASTY N/A 06/14/2019   Procedure:  CORONARY BALLOON ANGIOPLASTY;  Surgeon: Burnell Blanks, MD;  Location: [facility];  Service: Cardiovascular;  Laterality: N/A;  . CORONARY STENT INTERVENTION  N/A 06/17/2016   Procedure: Coronary Stent Intervention;  Surgeon: Burnell Blanks, MD;  Location: [facility];  Service: Cardiovascular;  Laterality: N/A;  . epidural steroid injections    . LAPAROSCOPIC CHOLECYSTECTOMY  1996  . LEFT HEART CATH AND CORS/GRAFTS ANGIOGRAPHY N/A 06/17/2016   Procedure: Left Heart Cath and Cors/Grafts Angiography;  Surgeon: Burnell Blanks, MD;  Location: [facility];  Service: Cardiovascular;  Laterality: N/A;  . LEFT HEART CATH AND CORS/GRAFTS ANGIOGRAPHY N/A 06/14/2019   Procedure: LEFT HEART CATH AND CORS/GRAFTS ANGIOGRAPHY;  Surgeon: Burnell Blanks, MD;  Location: [facility];  Service: Cardiovascular;  Laterality: N/A;  . MASS EXCISION N/A 11/11/2015   Procedure: EXCISION OF MID CHEST  MASS;  Surgeon: Judeth Horn, MD;  Location: [facility];  Service: General;  Laterality: N/A;  . MASS EXCISION N/A 12/23/2015   Procedure: REEXCISION OF CHEST WALL TUMOR SITE;  Surgeon: Judeth Horn, MD;  Location: [facility];  Service: General;  Laterality: N/A;  . POPLITEAL SYNOVIAL CYST EXCISION Right   . TONSILLECTOMY  1952   & Adenoidectomy   Social History   Occupational History  . Occupation: retired  Tobacco Use  . Smoking status: Former Smoker    Packs/day: 1.50    Years: 20.00    Pack years: 30.00    Types: Cigarettes  . Smokeless tobacco: Never Used  . Tobacco comment: "quit 06/2005"  Substance and Sexual Activity  . Alcohol use: No    Alcohol/week: 0.0 standard drinks  . Drug use: No  . Sexual activity: Not on file

## 2019-08-01 ENCOUNTER — Telehealth: Payer: Self-pay | Admitting: Cardiovascular Disease

## 2019-08-01 NOTE — Telephone Encounter (Signed)
    Pt is calling about letter he received about his bill. Transferred call to billing

## 2019-08-04 DIAGNOSIS — T161XXA Foreign body in right ear, initial encounter: Secondary | ICD-10-CM | POA: Diagnosis not present

## 2019-08-04 DIAGNOSIS — H903 Sensorineural hearing loss, bilateral: Secondary | ICD-10-CM | POA: Diagnosis not present

## 2019-09-04 ENCOUNTER — Encounter: Payer: Self-pay | Admitting: Cardiovascular Disease

## 2019-09-04 ENCOUNTER — Ambulatory Visit: Payer: PPO | Admitting: Cardiovascular Disease

## 2019-09-04 ENCOUNTER — Other Ambulatory Visit: Payer: Self-pay

## 2019-09-04 VITALS — BP 120/76 | HR 78 | Ht 71.0 in | Wt 190.0 lb

## 2019-09-04 DIAGNOSIS — I255 Ischemic cardiomyopathy: Secondary | ICD-10-CM | POA: Diagnosis not present

## 2019-09-04 DIAGNOSIS — E785 Hyperlipidemia, unspecified: Secondary | ICD-10-CM

## 2019-09-04 DIAGNOSIS — I25708 Atherosclerosis of coronary artery bypass graft(s), unspecified, with other forms of angina pectoris: Secondary | ICD-10-CM | POA: Diagnosis not present

## 2019-09-04 NOTE — Patient Instructions (Signed)

## 2019-09-04 NOTE — Progress Notes (Signed)
Chief Complaint  Patient presents with  . Follow-up    CAD    History of Present Illness: 74 yo male with history of CAD s/p CABG 2007, Non-hodgkins Lymphoma and HLD here today for cardiac follow up. He had a 4V CABG in 2007. Cath in 2010 showed occluded SVG to Diagonal and occluded SVG to PDA/PLA withpatent LIMA graft to the  LAD. There was diffuse disease in distal vessels felt best to be managed medically. 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 Echo April 2018 with LVEF=40-45% and hypokinesis of the anteroseptal wall and apex. Nuclear stress test April 2018 with possible ischemia. Cardiac cath 06/17/16 with patent LIMA to LAD, all other grafts known to be occluded. Severe stenosis in the proximal RCA treated with a drug eluting stent. He was seen in our office August 2019 by Lyda Jester, PA-C with c/o dyspnea with exertion. Cardiac monitor with sinus rhythm, rare PVCs, rare PACs. Nuclear stress test September 2019 without ischemia. Echo September 2019 with LVEF=40%, trivial MR. He was tried on Cozaar in October 2019 but did not tolerate due to dizziness and presumed hypotension.  He was seen in April 2021 and reported dyspnea with exertion. Cardiac cath 06/14/19 showed patent LIMA to LAD with known occlusion of the mid LAD, patent RCA stent. There was a moderate left main stenosis and severe proximal Circumflex stenosis in a moderate caliber vessel. I attempted PCI of the Circumflex which was difficult. Balloon angioplasty of the ostial Circumflex with no stent placement. Plans for medical management of CAD and possible referral for redo CABG if his symptoms could not be controlled with medical therapy. He has started cyclodextrin which he found online with independent research and is hopeful that this will reverse some of his CAD. This is approved for use in [place].   He is here today for follow up. Overall feeling well. The patient denies any chest pain,   palpitations, lower extremity edema, orthopnea, PND, dizziness, near syncope or syncope. Still having some dyspnea at rest but exercising and tolerating the exercise. No exertional chest pain.     Primary Care Physician: [name], MD  Past Medical History:  Diagnosis Date  . Allergic rhinitis    grass, dust  . Buttock pain   . CAD (coronary artery disease)    Last cath 2010 per Dr. Doreatha Lew with diffuse multivessel disease, small caliber vessels not felt to be suitable for PCI  . Chronic left hip pain   . Colon polyps    pt says a colonoscopy did not confirm this, said there was nothing there  . Diverticulosis of sigmoid colon 2010   colonoscopy - [facility]  . Dizziness    1 episode   . Fibromyalgia   . GERD (gastroesophageal reflux disease)   . Head pain    "not chronic; I have myofasial tightening in my head that causes pain in my skull" (06/17/2016)  . HLD (hyperlipidemia)   . Hypertension   . Leiomyosarcoma of chest wall ([place]) 11/2015   surgical excision alone recommended (per notes from Dr. Shon Baton office)  . Myeloma ([place])    "found it in my chest when they did the excision"  . Myocardial infarction ([place]) 06/2005  . Non Hodgkin's lymphoma ([place]) dx'd 06/2005    Past Surgical History:  Procedure Laterality Date  . BALLOON SINUPLASTY  2014  . CARDIAC CATHETERIZATION  ~ 2008   "Dr. Doreatha Lew"  . CATARACT EXTRACTION W/  INTRAOCULAR LENS IMPLANT Right   . colonscopy  2010  . CORONARY ANGIOPLASTY WITH STENT PLACEMENT  06/17/2016  . CORONARY ARTERY BYPASS GRAFT  5/07   x5. LV dysfunction.   . CORONARY BALLOON ANGIOPLASTY N/A 06/14/2019   Procedure: CORONARY BALLOON ANGIOPLASTY;  Surgeon: Burnell Blanks, MD;  Location: [facility];  Service: Cardiovascular;  Laterality: N/A;  . CORONARY STENT INTERVENTION N/A 06/17/2016   Procedure: Coronary Stent Intervention;  Surgeon: Burnell Blanks, MD;  Location: [facility];  Service: Cardiovascular;   Laterality: N/A;  . epidural steroid injections    . LAPAROSCOPIC CHOLECYSTECTOMY  1996  . LEFT HEART CATH AND CORS/GRAFTS ANGIOGRAPHY N/A 06/17/2016   Procedure: Left Heart Cath and Cors/Grafts Angiography;  Surgeon: Burnell Blanks, MD;  Location: [facility];  Service: Cardiovascular;  Laterality: N/A;  . LEFT HEART CATH AND CORS/GRAFTS ANGIOGRAPHY N/A 06/14/2019   Procedure: LEFT HEART CATH AND CORS/GRAFTS ANGIOGRAPHY;  Surgeon: Burnell Blanks, MD;  Location: [facility];  Service: Cardiovascular;  Laterality: N/A;  . MASS EXCISION N/A 11/11/2015   Procedure: EXCISION OF MID CHEST  MASS;  Surgeon: Judeth Horn, MD;  Location: [facility];  Service: General;  Laterality: N/A;  . MASS EXCISION N/A 12/23/2015   Procedure: REEXCISION OF CHEST WALL TUMOR SITE;  Surgeon: Judeth Horn, MD;  Location: [facility];  Service: General;  Laterality: N/A;  . POPLITEAL SYNOVIAL CYST EXCISION Right   . TONSILLECTOMY  1952   & Adenoidectomy    Current Outpatient Medications  Medication Sig Dispense Refill  . aspirin EC 81 MG tablet Take 81 mg by mouth daily.    . cholecalciferol (VITAMIN D) 1000 units tablet Take 1,000 Units by mouth in the morning and at bedtime.     . clopidogrel (PLAVIX) 75 MG tablet TAKE 1 TABLET (75 MG TOTAL) BY MOUTH DAILY WITH BREAKFAST. 90 tablet 3  . Coenzyme Q10 300 MG CAPS Take 300 mg by mouth in the morning and at bedtime.     . cyanocobalamin 2000 MCG tablet Take 2,000 mcg by mouth daily.    . cyclobenzaprine (FLEXERIL) 10 MG tablet Take 10 mg by mouth 2 (two) times daily.    Marland Kitchen ezetimibe (ZETIA) 10 MG tablet TAKE 1 TABLET BY MOUTH EVERY DAY 90 tablet 2  . isosorbide mononitrate (IMDUR) 30 MG 24 hr tablet Take 0.5 tablets (15 mg total) by mouth daily. 45 tablet 3  . metoprolol succinate (TOPROL-XL) 25 MG 24 hr tablet TAKE 1 TABLET BY MOUTH DAILY. 90 tablet 2  . naltrexone (DEPADE) 50 MG tablet Take 50 mg by mouth 2 (two)  times daily.     . nitroGLYCERIN (NITROSTAT) 0.4 MG SL tablet Place 1 tablet (0.4 mg total) under the tongue every 5 (five) minutes as needed for chest pain. 25 tablet 3  . OVER THE COUNTER MEDICATION Take 450 mg by mouth in the morning and at bedtime. Cocoa    . rosuvastatin (CRESTOR) 40 MG tablet TAKE 1 TABLET BY MOUTH EVERY DAY 90 tablet 1  . TURMERIC CURCUMIN PO Take 1,500 mg by mouth in the morning and at bedtime.     No current facility-administered medications for this visit.    Allergies  Allergen Reactions  . Niacin And Related Nausea And Vomiting  . Niaspan [Niacin Er] Nausea And Vomiting and Other (See Comments)    Stomach intolerance    Social History   Socioeconomic History  .  Marital status: Married    Spouse name: [name]  . Number of children: 2  . Years of education: college  . Highest education level: Not on file  Occupational History  . Occupation: retired  Tobacco Use  . Smoking status: Former Smoker    Packs/day: 1.50    Years: 20.00    Pack years: 30.00    Types: Cigarettes  . Smokeless tobacco: Never Used  . Tobacco comment: "quit 06/2005"  Vaping Use  . Vaping Use: Never used  Substance and Sexual Activity  . Alcohol use: No    Alcohol/week: 0.0 standard drinks  . Drug use: No  . Sexual activity: Not on file  Other Topics Concern  . Not on file  Social History Narrative   Married, 2 children.   Runs an executive recruiting company.    Left-handed.   Caffeine: iced tea, green tea; tea daily    Social Determinants of Health   Financial Resource Strain:   . Difficulty of Paying Living Expenses:   Food Insecurity:   . Worried About Charity fundraiser in the Last Year:   . Arboriculturist in the Last Year:   Transportation Needs:   . Film/video editor (Medical):   Marland Kitchen Lack of Transportation (Non-Medical):   Physical Activity:   . Days of Exercise per Week:   . Minutes of Exercise per Session:   Stress:   . Feeling of Stress :     Social Connections:   . Frequency of Communication with Friends and Family:   . Frequency of Social Gatherings with Friends and Family:   . Attends Religious Services:   . Active Member of Clubs or Organizations:   . Attends Archivist Meetings:   Marland Kitchen Marital Status:   Intimate Partner Violence:   . Fear of Current or Ex-Partner:   . Emotionally Abused:   Marland Kitchen Physically Abused:   . Sexually Abused:     Family History  Problem Relation Age of Onset  . Heart disease Father   . Heart attack Father   . Congestive Heart Failure Father   . Dementia Mother   . Cancer Paternal Grandfather        ? type    Review of Systems:  As stated in the HPI and otherwise negative.   BP 120/76   Pulse 78   Ht 5\' 11"  (1.803 m)   Wt 190 lb (86.2 kg)   SpO2 96%   BMI 26.50 kg/m   Physical Examination:  General: Well developed, well nourished, NAD  HEENT: OP clear, mucus membranes moist  SKIN: warm, dry. No rashes. Neuro: No focal deficits  Musculoskeletal: Muscle strength 5/5 all ext  Psychiatric: Mood and affect normal  Neck: No JVD, no carotid bruits, no thyromegaly, no lymphadenopathy.  Lungs:Clear bilaterally, no wheezes, rhonci, crackles Cardiovascular: Regular rate and rhythm. No murmurs, gallops or rubs. Abdomen:Soft. Bowel sounds present. Non-tender.  Extremities: No lower extremity edema. Pulses are 2 + in the bilateral DP/PT.  Echo September 2019: - Left ventricle: The cavity size was normal. Wall thickness was   increased in a pattern of mild LVH. Systolic function was   moderately reduced. The estimated ejection fraction was in the   range of 35% to 40%. Anteroseptal, inferoseptal, and anterior   hypokinesis. Doppler parameters are consistent with abnormal left   ventricular relaxation (grade 1 diastolic dysfunction). - Aortic valve: There was no stenosis. - Mitral valve: Mildly calcified annulus. There was trivial  regurgitation. - Right ventricle: The cavity  size was normal. Systolic function   was mildly reduced. - Tricuspid valve: Peak RV-RA gradient (S): 27 mm Hg. - Pulmonary arteries: PA peak pressure: 30 mm Hg (S). - Inferior vena cava: The vessel was normal in size. The   respirophasic diameter changes were in the normal range (>= 50%),   consistent with normal central venous pressure.  Impressions:  - Normal LV size with mild LV hypertrophy. EF 35-40% with   anteroseptal, inferoseptal, and anterior hypokinesis. Normal RV   size with mildly decreased systolic function. No significant   valvular abnormalities.   EKG:  EKG is not ordered today. The ekg ordered today demonstrates   Recent Labs: 01/26/2019: ALT 32 06/07/2019: BUN 24; Creatinine, Ser 1.25; Hemoglobin 12.6; Platelets 298; Potassium 4.7; Sodium 137   Lipid Panel Followed in primary care   Wt Readings from Last 3 Encounters:  09/04/19 190 lb (86.2 kg)  06/28/19 190 lb (86.2 kg)  06/21/19 190 lb 12.8 oz (86.5 kg)     Other studies Reviewed: Additional studies/ records that were reviewed today include: . Review of the above records demonstrates:   Assessment and Plan:   1. CAD s/p CABG with angina: He has extensive CAD with no good targets for PCI. He is having no chest pain. Dyspnea is improved. Continue ASA, Plavix, statin, Imdur and beta blocker.   2. Hyperlipidemia:Lipids followed in primary care and well controlled. Continue statin  3. Ischemic cardiomyopathy: LVEF=35-40% by echo September 2019. He did not tolerate low dose Cozaar due to hypotension and dizziness. I suspect that he will not tolerate afterload reducing agents. Will continue beta blocker  Current medicines are reviewed at length with the patient today.  The patient does not have concerns regarding medicines.  The following changes have been made:  no change  Labs/ tests ordered today include:   No orders of the defined types were placed in this encounter.   Disposition:   FU with me  or care team APP post cath   Signed, Lauree Chandler, MD 09/04/2019 9:48 AM    [address], [place], [place]  [address] Phone: [phone]; Fax: [phone]

## 2019-09-11 NOTE — Telephone Encounter (Signed)
Dr. Jaynee Eagles asked that I contact pt to see about canceling next botox appt and encourage him to see Dr. Ernestina Patches. I called the pt and LVM advising that I was following up per Dr. Jaynee Eagles based on mychart message and I asked for call back or a mychart message.

## 2019-09-14 DIAGNOSIS — L570 Actinic keratosis: Secondary | ICD-10-CM | POA: Diagnosis not present

## 2019-09-14 DIAGNOSIS — Z85828 Personal history of other malignant neoplasm of skin: Secondary | ICD-10-CM | POA: Diagnosis not present

## 2019-09-14 DIAGNOSIS — L218 Other seborrheic dermatitis: Secondary | ICD-10-CM | POA: Diagnosis not present

## 2019-09-18 ENCOUNTER — Ambulatory Visit: Payer: PPO | Admitting: Neurology

## 2019-11-04 ENCOUNTER — Other Ambulatory Visit: Payer: Self-pay | Admitting: Cardiovascular Disease

## 2019-11-10 DIAGNOSIS — C8290 Follicular lymphoma, unspecified, unspecified site: Secondary | ICD-10-CM | POA: Diagnosis not present

## 2019-11-10 DIAGNOSIS — R0600 Dyspnea, unspecified: Secondary | ICD-10-CM | POA: Diagnosis not present

## 2019-11-10 DIAGNOSIS — I10 Essential (primary) hypertension: Secondary | ICD-10-CM | POA: Diagnosis not present

## 2019-11-10 DIAGNOSIS — G8929 Other chronic pain: Secondary | ICD-10-CM | POA: Diagnosis not present

## 2019-11-10 DIAGNOSIS — E785 Hyperlipidemia, unspecified: Secondary | ICD-10-CM | POA: Diagnosis not present

## 2019-11-10 DIAGNOSIS — I2510 Atherosclerotic heart disease of native coronary artery without angina pectoris: Secondary | ICD-10-CM | POA: Diagnosis not present

## 2019-11-10 DIAGNOSIS — J439 Emphysema, unspecified: Secondary | ICD-10-CM | POA: Diagnosis not present

## 2019-11-22 DIAGNOSIS — Z23 Encounter for immunization: Secondary | ICD-10-CM | POA: Diagnosis not present

## 2019-11-24 DIAGNOSIS — R0600 Dyspnea, unspecified: Secondary | ICD-10-CM | POA: Diagnosis not present

## 2019-11-24 DIAGNOSIS — I10 Essential (primary) hypertension: Secondary | ICD-10-CM | POA: Diagnosis not present

## 2019-11-24 DIAGNOSIS — E785 Hyperlipidemia, unspecified: Secondary | ICD-10-CM | POA: Diagnosis not present

## 2019-11-24 DIAGNOSIS — G8929 Other chronic pain: Secondary | ICD-10-CM | POA: Diagnosis not present

## 2019-11-24 DIAGNOSIS — M545 Low back pain, unspecified: Secondary | ICD-10-CM | POA: Diagnosis not present

## 2019-11-24 DIAGNOSIS — J439 Emphysema, unspecified: Secondary | ICD-10-CM | POA: Diagnosis not present

## 2019-11-24 DIAGNOSIS — I2510 Atherosclerotic heart disease of native coronary artery without angina pectoris: Secondary | ICD-10-CM | POA: Diagnosis not present

## 2019-11-24 DIAGNOSIS — Z79899 Other long term (current) drug therapy: Secondary | ICD-10-CM | POA: Diagnosis not present

## 2019-11-24 NOTE — Telephone Encounter (Addendum)
Reviewed last ov note. I do not see where Dr. Angelena Form recommends increasing Imdur.  Sounds like he is just starting the amlodipine now.

## 2019-11-24 NOTE — Telephone Encounter (Signed)
Added amlodipine to medicine list via [facility] list.

## 2019-12-08 DIAGNOSIS — J439 Emphysema, unspecified: Secondary | ICD-10-CM | POA: Diagnosis not present

## 2019-12-08 DIAGNOSIS — C8290 Follicular lymphoma, unspecified, unspecified site: Secondary | ICD-10-CM | POA: Diagnosis not present

## 2019-12-08 DIAGNOSIS — J309 Allergic rhinitis, unspecified: Secondary | ICD-10-CM | POA: Diagnosis not present

## 2019-12-08 DIAGNOSIS — R0600 Dyspnea, unspecified: Secondary | ICD-10-CM | POA: Diagnosis not present

## 2019-12-08 DIAGNOSIS — J449 Chronic obstructive pulmonary disease, unspecified: Secondary | ICD-10-CM | POA: Diagnosis not present

## 2019-12-11 ENCOUNTER — Inpatient Hospital Stay: Payer: PPO | Attending: Oncology | Admitting: Oncology

## 2019-12-11 ENCOUNTER — Encounter: Payer: Self-pay | Admitting: Oncology

## 2019-12-11 ENCOUNTER — Other Ambulatory Visit: Payer: Self-pay

## 2019-12-11 VITALS — BP 134/73 | HR 88 | Temp 97.0°F | Resp 16 | Ht 71.0 in | Wt 199.0 lb

## 2019-12-11 DIAGNOSIS — I252 Old myocardial infarction: Secondary | ICD-10-CM | POA: Insufficient documentation

## 2019-12-11 DIAGNOSIS — Z8572 Personal history of non-Hodgkin lymphomas: Secondary | ICD-10-CM | POA: Diagnosis not present

## 2019-12-11 DIAGNOSIS — Z8589 Personal history of malignant neoplasm of other organs and systems: Secondary | ICD-10-CM | POA: Insufficient documentation

## 2019-12-11 DIAGNOSIS — I2510 Atherosclerotic heart disease of native coronary artery without angina pectoris: Secondary | ICD-10-CM | POA: Diagnosis not present

## 2019-12-11 DIAGNOSIS — E785 Hyperlipidemia, unspecified: Secondary | ICD-10-CM | POA: Insufficient documentation

## 2019-12-11 DIAGNOSIS — C493 Malignant neoplasm of connective and soft tissue of thorax: Secondary | ICD-10-CM | POA: Diagnosis not present

## 2019-12-11 NOTE — Progress Notes (Signed)
  [facility] OFFICE PROGRESS NOTE   Diagnosis: Non-Hodgkin's lymphoma, leiomyosarcoma  INTERVAL HISTORY:   Mr. Cairns returns as scheduled.  He currently feels well.  He reports a 3 to 61-month history of dyspnea.  The dyspnea resolved when he was placed on oral and nasal inhalers by his primary provider.  The fullness at the left anterior cervical region is currently smaller.  No change at the chest wall surgical site.  Good appetite.  Objective:  Vital signs in last 24 hours:  Blood pressure 134/73, pulse 88, temperature (!) 97 F (36.1 C), temperature source Tympanic, resp. rate 16, height 5\' 11"  (1.803 m), weight 199 lb (90.3 kg), SpO2 100 %.    HEENT: 2 cm fullness at the angle of the left mandible overlying the carotid pulse Lymphatics: No cervical, supraclavicular, axillary, or inguinal nodes Resp: Scattered end inspiratory coarse rhonchi, no respiratory distress Cardio: Regular rate and rhythm GI: No mass, nontender, no hepatosplenomegaly Vascular: No leg edema  Skin: Anterior chest scar without evidence of recurrent tumor Lab Results:  Lab Results  Component Value Date   WBC 9.6 06/07/2019   HGB 12.6 (L) 06/07/2019   HCT 38.3 06/07/2019   MCV 89 06/07/2019   PLT 298 06/07/2019   NEUTROABS 8.4 (H) 12/20/2015    CMP  Lab Results  Component Value Date   NA 137 06/07/2019   K 4.7 06/07/2019   CL 106 06/07/2019   CO2 26 06/07/2019   GLUCOSE 100 (H) 06/07/2019   BUN 24 06/07/2019   CREATININE 1.25 06/07/2019   CALCIUM 9.0 06/07/2019   PROT 6.7 01/26/2019   ALBUMIN 3.9 01/26/2019   AST 40 (H) 01/26/2019   ALT 32 01/26/2019   ALKPHOS 60 01/26/2019   BILITOT 0.5 01/26/2019   GFRNONAA 56 (L) 06/07/2019   GFRAA 65 06/07/2019     Medications: I have reviewed the patient's current medications.   Assessment/Plan:  1. Leiomyosarcoma of the anterior chest wall, status post an excisional biopsy on 11/11/2015 confirming a  leiomyosarcoma ? Positive lateral surgical margin, 2 x 0.6 cm, up to 5 mitoses per 10 high-powered fields ? Reexcision 12/23/2015-no leiomyosarcoma, negative resection margins  2. 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 History of coronary artery disease/myocardial infarction-status post coronary artery bypass surgery in 2007, status post placement of a RCA stent May 2018  4. Hyperlipidemia  5.  Left high anterior cervical node first noted December 2019     Disposition: Mr. Joseph Soto appears stable.  There is no clinical evidence for progression of lymphoma or recurrence of the leiomyosarcoma.  The fullness overlying the left carotid may be a prominent carotid bulb or the submandibular gland.  He will return for an office visit in 9 months.  Betsy Coder, MD  12/11/2019  4:16 PM

## 2019-12-12 ENCOUNTER — Telehealth: Payer: Self-pay | Admitting: Oncology

## 2019-12-12 ENCOUNTER — Other Ambulatory Visit: Payer: Self-pay | Admitting: Cardiovascular Disease

## 2019-12-12 ENCOUNTER — Encounter: Payer: Self-pay | Admitting: *Deleted

## 2019-12-12 NOTE — Progress Notes (Signed)
Patient sent copy of vaccine card to chart his booster dose.

## 2019-12-12 NOTE — Telephone Encounter (Signed)
Scheduled appointment per 1/11 los. Spoke to patient who is aware of appointment date and time.  °

## 2019-12-13 DIAGNOSIS — L812 Freckles: Secondary | ICD-10-CM | POA: Diagnosis not present

## 2019-12-13 DIAGNOSIS — L218 Other seborrheic dermatitis: Secondary | ICD-10-CM | POA: Diagnosis not present

## 2019-12-13 DIAGNOSIS — L570 Actinic keratosis: Secondary | ICD-10-CM | POA: Diagnosis not present

## 2019-12-13 DIAGNOSIS — Z85828 Personal history of other malignant neoplasm of skin: Secondary | ICD-10-CM | POA: Diagnosis not present

## 2019-12-13 DIAGNOSIS — D225 Melanocytic nevi of trunk: Secondary | ICD-10-CM | POA: Diagnosis not present

## 2019-12-13 DIAGNOSIS — L821 Other seborrheic keratosis: Secondary | ICD-10-CM | POA: Diagnosis not present

## 2019-12-13 DIAGNOSIS — L720 Epidermal cyst: Secondary | ICD-10-CM | POA: Diagnosis not present

## 2019-12-15 DIAGNOSIS — R102 Pelvic and perineal pain: Secondary | ICD-10-CM | POA: Diagnosis not present

## 2019-12-19 DIAGNOSIS — R102 Pelvic and perineal pain: Secondary | ICD-10-CM | POA: Diagnosis not present

## 2019-12-21 DIAGNOSIS — R102 Pelvic and perineal pain: Secondary | ICD-10-CM | POA: Diagnosis not present

## 2019-12-23 ENCOUNTER — Other Ambulatory Visit: Payer: Self-pay | Admitting: Cardiovascular Disease

## 2019-12-25 DIAGNOSIS — H6123 Impacted cerumen, bilateral: Secondary | ICD-10-CM | POA: Diagnosis not present

## 2019-12-26 DIAGNOSIS — R102 Pelvic and perineal pain: Secondary | ICD-10-CM | POA: Diagnosis not present

## 2019-12-28 DIAGNOSIS — R102 Pelvic and perineal pain: Secondary | ICD-10-CM | POA: Diagnosis not present

## 2020-01-01 DIAGNOSIS — R102 Pelvic and perineal pain: Secondary | ICD-10-CM | POA: Diagnosis not present

## 2020-01-08 DIAGNOSIS — R102 Pelvic and perineal pain: Secondary | ICD-10-CM | POA: Diagnosis not present

## 2020-01-10 DIAGNOSIS — R102 Pelvic and perineal pain: Secondary | ICD-10-CM | POA: Diagnosis not present

## 2020-01-15 DIAGNOSIS — R102 Pelvic and perineal pain: Secondary | ICD-10-CM | POA: Diagnosis not present

## 2020-01-17 DIAGNOSIS — R102 Pelvic and perineal pain: Secondary | ICD-10-CM | POA: Diagnosis not present

## 2020-01-24 DIAGNOSIS — R102 Pelvic and perineal pain: Secondary | ICD-10-CM | POA: Diagnosis not present

## 2020-01-26 DIAGNOSIS — R102 Pelvic and perineal pain: Secondary | ICD-10-CM | POA: Diagnosis not present

## 2020-01-29 DIAGNOSIS — R102 Pelvic and perineal pain: Secondary | ICD-10-CM | POA: Diagnosis not present

## 2020-02-06 DIAGNOSIS — E785 Hyperlipidemia, unspecified: Secondary | ICD-10-CM | POA: Diagnosis not present

## 2020-02-06 DIAGNOSIS — M545 Low back pain, unspecified: Secondary | ICD-10-CM | POA: Diagnosis not present

## 2020-02-27 DIAGNOSIS — M79605 Pain in left leg: Secondary | ICD-10-CM | POA: Diagnosis not present

## 2020-02-27 DIAGNOSIS — R269 Unspecified abnormalities of gait and mobility: Secondary | ICD-10-CM | POA: Diagnosis not present

## 2020-03-11 DIAGNOSIS — R269 Unspecified abnormalities of gait and mobility: Secondary | ICD-10-CM | POA: Diagnosis not present

## 2020-03-11 DIAGNOSIS — M79605 Pain in left leg: Secondary | ICD-10-CM | POA: Diagnosis not present

## 2020-03-25 DIAGNOSIS — M79605 Pain in left leg: Secondary | ICD-10-CM | POA: Diagnosis not present

## 2020-03-25 DIAGNOSIS — R269 Unspecified abnormalities of gait and mobility: Secondary | ICD-10-CM | POA: Diagnosis not present

## 2020-04-01 DIAGNOSIS — M79605 Pain in left leg: Secondary | ICD-10-CM | POA: Diagnosis not present

## 2020-04-01 DIAGNOSIS — R269 Unspecified abnormalities of gait and mobility: Secondary | ICD-10-CM | POA: Diagnosis not present

## 2020-04-09 DIAGNOSIS — R269 Unspecified abnormalities of gait and mobility: Secondary | ICD-10-CM | POA: Diagnosis not present

## 2020-04-09 DIAGNOSIS — M79605 Pain in left leg: Secondary | ICD-10-CM | POA: Diagnosis not present

## 2020-04-15 DIAGNOSIS — I129 Hypertensive chronic kidney disease with stage 1 through stage 4 chronic kidney disease, or unspecified chronic kidney disease: Secondary | ICD-10-CM | POA: Diagnosis not present

## 2020-04-15 DIAGNOSIS — J069 Acute upper respiratory infection, unspecified: Secondary | ICD-10-CM | POA: Diagnosis not present

## 2020-04-15 DIAGNOSIS — J439 Emphysema, unspecified: Secondary | ICD-10-CM | POA: Diagnosis not present

## 2020-04-15 DIAGNOSIS — J449 Chronic obstructive pulmonary disease, unspecified: Secondary | ICD-10-CM | POA: Diagnosis not present

## 2020-04-15 DIAGNOSIS — R0600 Dyspnea, unspecified: Secondary | ICD-10-CM | POA: Diagnosis not present

## 2020-04-15 DIAGNOSIS — I2510 Atherosclerotic heart disease of native coronary artery without angina pectoris: Secondary | ICD-10-CM | POA: Diagnosis not present

## 2020-04-18 DIAGNOSIS — M79605 Pain in left leg: Secondary | ICD-10-CM | POA: Diagnosis not present

## 2020-04-18 DIAGNOSIS — R269 Unspecified abnormalities of gait and mobility: Secondary | ICD-10-CM | POA: Diagnosis not present

## 2020-04-24 ENCOUNTER — Institutional Professional Consult (permissible substitution): Payer: PPO | Admitting: Internal Medicine

## 2020-04-29 DIAGNOSIS — M79605 Pain in left leg: Secondary | ICD-10-CM | POA: Diagnosis not present

## 2020-04-29 DIAGNOSIS — R269 Unspecified abnormalities of gait and mobility: Secondary | ICD-10-CM | POA: Diagnosis not present

## 2020-05-02 ENCOUNTER — Other Ambulatory Visit: Payer: Self-pay | Admitting: Physician Assistant

## 2020-05-09 DIAGNOSIS — R0602 Shortness of breath: Secondary | ICD-10-CM | POA: Diagnosis not present

## 2020-05-09 DIAGNOSIS — J449 Chronic obstructive pulmonary disease, unspecified: Secondary | ICD-10-CM | POA: Diagnosis not present

## 2020-05-09 DIAGNOSIS — M7912 Myalgia of auxiliary muscles, head and neck: Secondary | ICD-10-CM | POA: Diagnosis not present

## 2020-05-09 DIAGNOSIS — R110 Nausea: Secondary | ICD-10-CM | POA: Diagnosis not present

## 2020-05-10 DIAGNOSIS — Z1152 Encounter for screening for COVID-19: Secondary | ICD-10-CM | POA: Diagnosis not present

## 2020-05-10 DIAGNOSIS — R110 Nausea: Secondary | ICD-10-CM | POA: Diagnosis not present

## 2020-05-10 DIAGNOSIS — R0602 Shortness of breath: Secondary | ICD-10-CM | POA: Diagnosis not present

## 2020-05-16 ENCOUNTER — Encounter: Payer: Self-pay | Admitting: Internal Medicine

## 2020-05-16 ENCOUNTER — Ambulatory Visit: Payer: PPO | Admitting: Internal Medicine

## 2020-05-16 ENCOUNTER — Other Ambulatory Visit: Payer: Self-pay

## 2020-05-16 VITALS — BP 126/60 | HR 97 | Temp 97.9°F | Ht 71.0 in | Wt 200.8 lb

## 2020-05-16 DIAGNOSIS — J449 Chronic obstructive pulmonary disease, unspecified: Secondary | ICD-10-CM | POA: Diagnosis not present

## 2020-05-16 NOTE — Patient Instructions (Signed)
The patient should have follow up scheduled with myself in 6 months.   Prior to next visit patient should have: Full set of PFTs - 1 hour  Understanding COPD   What is COPD? COPD stands for chronic obstructive pulmonary (lung) disease. COPD is a general term used for several lung diseases.  COPD is an umbrella term and encompasses other  common diseases in this group like chronic bronchitis and emphysema. Chronic asthma may also be included in this group. While some patients with COPD have only chronic bronchitis or emphysema, most patients have a combination of both.  You might hear these terms used in exchange for one another.   COPD adds to the work of the heart. Diseased lungs may reduce the amount of oxygen that goes to the blood. High blood pressure in blood vessels from the heart to the lungs makes it difficult for the heart to pump. Lung disease can also cause the body to produce too many red blood cells which may make the blood thicker and harder to pump.   Patients who have COPD with low oxygen levels may develop an enlarged heart (cor pulmonale). This condition weakens the heart and causes increased shortness of breath and swelling in the legs and feet.   Chronic bronchitis Chronic bronchitis is irritation and inflammation (swelling) of the lining in the bronchial tubes (air passages). The irritation causes coughing and an excess amount of mucus in the airways. The swelling makes it difficult to get air in and out of the lungs. The small, hair-like structures on the inside of the airways (called cilia) may be damaged by the irritation. The cilia are then unable to help clean mucus from the airways.  Bronchitis is generally considered to be chronic when you have: a productive cough (cough up mucus) and shortness of breath that lasts about 3 months or more each year for 2 or more years in a row. Your doctor may define chronic bronchitis differently.   Emphysema Emphysema is the  destruction, or breakdown, of the walls of the alveoli (air sacs) located at the end of the bronchial tubes. The damaged alveoli are not able to exchange oxygen and carbon dioxide between the lungs and the blood. The bronchioles lose their elasticity and collapse when you exhale, trapping air in the lungs. The trapped air keeps fresh air and oxygen from entering the lungs.   Who is affected by COPD? Emphysema and chronic bronchitis affect approximately 16 million people in the [place], or close to 11 percent of the population.   Symptoms of COPD   Shortness of breath   Shortness of breath with mild exercise (walking, using the stairs, etc.)   Chronic, productive cough (with mucus)   A feeling of "tightness" in the chest   Wheezing   What causes COPD? The two primary causes of COPD are cigarette smoking and alpha1-antitrypsin (AAT) deficiency. Air pollution and occupational dusts may also contribute to COPD, especially when the person exposed to these substances is a cigarette smoker.  Cigarette smoke causes COPD by irritating the airways and creating inflammation that narrows the airways, making it more difficult to breathe. Cigarette smoke also causes the cilia to stop working properly so mucus and trapped particles are not cleaned from the airways. As a result, chronic cough and excess mucus production develop, leading to chronic bronchitis.  In some people, chronic bronchitis and infections can lead to destruction of the small airways, or emphysema.  AAT deficiency, an inherited disorder, can  also lead to emphysema. Alpha antitrypsin (AAT) is a protective material produced in the liver and transported to the lungs to help combat inflammation. When there is not enough of the chemical AAT, the body is no longer protected from an enzyme in the white blood cells.   How is COPD diagnosed?  To diagnose COPD, the physician needs to know: . Do you smoke?  . Have you had chronic exposure to  dust or air pollutants?  . Do other members of your family have lung disease?  Marland Kitchen Are you short of breath?  . Do you get short of breath with exercise?  Marland Kitchen Do you have chronic cough and/or wheezing?  Marland Kitchen Do you cough up excess mucus?  To help with the diagnosis, the physician will conduct a thorough physical exam which includes:  1. Listening to your lungs and heart  2. Checking your blood pressure and pulse  3. Examining your nose and throat  4. Checking your feet and ankles for swelling   Laboratory and other tests Several laboratory and other tests are needed to confirm a diagnosis of COPD. These tests may include:  . Chest X-ray to look for lung changes that could be caused by COPD  .  Spirometry and pulmonary function tests (PFTs) to determine lung volume and air flow  . Pulse oximetry to measure the saturation of oxygen in the blood  . Arterial blood gases (ABGs) to determine the amount of oxygen and carbon dioxide in the blood  . Exercise testing to determine if the oxygen level in the blood drops during exercise   Treatment In the beginning stages of COPD, there is minimal shortness of breath that may be noticed only during exercise. As the disease progresses, shortness of breath may worsen and you may need to wear an oxygen device.   To help control other symptoms of COPD, the following treatments and lifestyle changes may be prescribed.  . Quitting smoking  . Avoiding cigarette smoke and other irritants  . Taking medications including: a. bronchodilators b. anti-inflammatory agents c. oxygen d. antibiotics  . Maintaining a healthy diet  . Following a structured exercise program such as pulmonary rehabilitation . Preventing respiratory infections  . Controlling stress   If your COPD progresses, you may be eligible to be evaluated for lung volume reduction surgery or lung transplantation. You may also be eligible to participate in certain clinical trials (research studies).  Ask your health care providers about studies being conducted in your hospital.   What is the outlook? Although COPD can not be cured, its symptoms can be treated and your quality of life can be improved. Your prognosis or outlook for the future will depend on how well your lungs are functioning, your symptoms, and how well you respond to and follow your treatment plan.

## 2020-05-16 NOTE — Progress Notes (Signed)
Joseph Soto    [id number]    [birth year]  Primary Care Physician:Paterson, Quillian Quince, MD  Referring Physician: [name], MD [address] [place],  [place] [address] Reason for Consultation: shortness of breath Date of Consultation: 05/16/2020  Chief complaint:   Chief Complaint  Patient presents with  . Consult    Hx of smoking.  Sob for the last 2 months.  Recent flu of some sort.  Uses the treadmill and lifts weights.  Ok when he builds up gradually.  Gets sob with inclines and being in a hurry.     HPI: Joseph Soto (pronounced Pli-sick) is a 75 y.o. gentleman with history of smoking and CAD s/p PCI last in 2020). He had a URI about 2 months ago and feels his breathing hasn't been right since then. This has been gradually coming on for a few months.   He exercises and does treadmill and weights. If he picks up a suit cases, or bends over, going up the stairs, he feels short of breath. He has recently been prescribed trelegy and this does help. He takes rescue inhaler when he is sick - 2 months ago and it help. But hasn't used it since. He denies chest pain, tightness or wheezing.  No hospitalizations for his breathing. He has been given prednisone as well in the past (most recently 2 months ago) and this helped his breathing a lot.   Social history:  Occupation: Retired, ran an Therapist, occupational, worked as an Programme researcher, broadcasting/film/video for [employer]. Exposures: lives at home with wife, no pets.   Social History   Occupational History  . Occupation: retired  Tobacco Use  . Smoking status: Former Smoker    Packs/day: 2.00    Years: 30.00    Pack years: 60.00    Types: Cigarettes    Quit date: 05/16/2000    Years since quitting: 20.0  . Smokeless tobacco: Never Used  . Tobacco comment: "quit 06/2005"  Vaping Use  . Vaping Use: Never used  Substance and Sexual Activity  . Alcohol use: No    Alcohol/week: 0.0 standard drinks  . Drug use: No  .  Sexual activity: Not on file    Relevant family history:  Family History  Problem Relation Age of Onset  . Heart disease Father   . Heart attack Father   . Congestive Heart Failure Father   . COPD Father   . Dementia Mother   . Cancer Paternal Grandfather        ? type    Past Medical History:  Diagnosis Date  . Allergic rhinitis    grass, dust  . Buttock pain   . CAD (coronary artery disease)    Last cath 2010 per Dr. Doreatha Lew with diffuse multivessel disease, small caliber vessels not felt to be suitable for PCI  . Chronic left hip pain   . Colon polyps    pt says a colonoscopy did not confirm this, said there was nothing there  . Diverticulosis of sigmoid colon 2010   colonoscopy - [facility]  . Dizziness    1 episode   . Fibromyalgia   . GERD (gastroesophageal reflux disease)   . Head pain    "not chronic; I have myofasial tightening in my head that causes pain in my skull" (06/17/2016)  . HLD (hyperlipidemia)   . Hypertension   . 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 Shon Baton office)  . Myeloma ([place])    "found it in my chest when they did the excision"  . Myocardial infarction ([place]) 06/2005  . Non Hodgkin's lymphoma ([place]) dx'd 06/2005    Past Surgical History:  Procedure Laterality Date  . BALLOON SINUPLASTY  2014  . CARDIAC CATHETERIZATION  ~ 2008   "Dr. Doreatha Lew"  . CATARACT EXTRACTION W/ INTRAOCULAR LENS IMPLANT Right   . colonscopy  2010  . CORONARY ANGIOPLASTY WITH STENT PLACEMENT  06/17/2016  . CORONARY ARTERY BYPASS GRAFT  5/07   x5. LV dysfunction.   . CORONARY BALLOON ANGIOPLASTY N/A 06/14/2019   Procedure: CORONARY BALLOON ANGIOPLASTY;  Surgeon: Burnell Blanks, MD;  Location: [facility];  Service: Cardiovascular;  Laterality: N/A;  . CORONARY STENT INTERVENTION N/A 06/17/2016   Procedure: Coronary Stent Intervention;  Surgeon: Burnell Blanks, MD;  Location: [facility];  Service: Cardiovascular;  Laterality: N/A;  . epidural steroid injections    . LAPAROSCOPIC CHOLECYSTECTOMY  1996  . LEFT HEART CATH AND CORS/GRAFTS ANGIOGRAPHY N/A 06/17/2016   Procedure: Left Heart Cath and Cors/Grafts Angiography;  Surgeon: Burnell Blanks, MD;  Location: [facility];  Service: Cardiovascular;  Laterality: N/A;  . LEFT HEART CATH AND CORS/GRAFTS ANGIOGRAPHY N/A 06/14/2019   Procedure: LEFT HEART CATH AND CORS/GRAFTS ANGIOGRAPHY;  Surgeon: Burnell Blanks, MD;  Location: [facility];  Service: Cardiovascular;  Laterality: N/A;  . MASS EXCISION N/A 11/11/2015   Procedure: EXCISION OF MID CHEST  MASS;  Surgeon: Judeth Horn, MD;  Location: [facility];  Service: General;  Laterality: N/A;  . MASS EXCISION N/A 12/23/2015   Procedure: REEXCISION OF CHEST WALL TUMOR SITE;  Surgeon: Judeth Horn, MD;  Location: [facility];  Service: General;  Laterality: N/A;  . POPLITEAL SYNOVIAL CYST EXCISION Right   . TONSILLECTOMY  1952   & Adenoidectomy     Physical Exam: Blood pressure 126/60, pulse 97, temperature 97.9 F (36.6 C), temperature source Temporal, height 5\' 11"  (1.803 m), weight 200 lb 12.8 oz (91.1 kg), SpO2 93 %. Gen:      No acute distress ENT:   Mmm, mild erythema, no tonsillar adenopathy. Small mobile left sided cervical lymph node palpated Lungs:    No increased respiratory effort, symmetric chest wall excursion, diminished bilaterally, no wheezes or crackles CV:         Regular rate and rhythm; no murmurs, rubs, or gallops.  No pedal edema Abd:      + bowel sounds; soft, non-tender; no distension MSK: no acute synovitis of DIP or PIP joints, no mechanics hands.  Skin:      Warm and dry; no rashes Neuro: normal speech, no focal facial asymmetry Psych: alert and oriented x3, normal mood and affect   Data Reviewed/Medical Decision Making:  Independent interpretation of tests: Imaging: . Review  of patient's chest xray 2019 May images revealed hyperinflation. The patient's images have been independently reviewed by me.    PFTs: I have personally reviewed the patient's PFTs and which show mild airflow limitation from 2015 but with hyperinflation and air trapping PFT Results Latest Ref Rng & Units 01/26/2014  FVC-Pre L 6.05  FVC-Predicted Pre % 136  FVC-Post L 5.54  FVC-Predicted Post % 125  Pre FEV1/FVC % % 47  Post FEV1/FCV % % 49  FEV1-Pre L 2.85  FEV1-Predicted Pre % 87  FEV1-Post L 2.73  DLCO uncorrected ml/min/mmHg 21.64  DLCO UNC% % 66  DLCO corrected ml/min/mmHg 21.64  DLCO COR %Predicted % 66  DLVA Predicted % 61  TLC L 10.32  TLC % Predicted % 146  RV % Predicted % 171    Labs:  Lab Results  Component Value Date   WBC 9.6 06/07/2019   HGB 12.6 (L) 06/07/2019   HCT 38.3 06/07/2019   MCV 89 06/07/2019   PLT 298 06/07/2019   Lab Results  Component Value Date   NA 137 06/07/2019   K 4.7 06/07/2019   CL 106 06/07/2019   CO2 26 06/07/2019     Immunization status:  Immunization History  Administered Date(s) Administered  . Influenza Split 11/27/2019  . Influenza, High Dose Seasonal PF 11/08/2016  . Influenza-Unspecified 10/16/2010, 11/11/2015, 11/09/2017  . PFIZER(Purple Top)SARS-COV-2 Vaccination 03/19/2019, 04/13/2019, 10/11/2019, 04/30/2020  . Pneumococcal Conjugate-13 02/09/2014  . Pneumococcal Polysaccharide-23 02/10/2012    . I reviewed prior external note(s) from cardiology, PCP . I reviewed the result(s) of the labs and imaging as noted above.  . I have ordered PFT   Assessment:  COPD, mild with progression of symptoms History of tobacco use   Plan/Recommendations: Continue trelegy daily. Would take albuterol as needed and prior to exercise. Up to date on vaccinations.  I will see him back in 6 months with PFTs to assess disease severity and progression  We discussed disease management and progression at length today.   Return  to Care: Return in about 6 months (around 11/15/2020).  Lenice Llamas, MD [facility] [place]  CC: [name], MD

## 2020-05-22 DIAGNOSIS — R269 Unspecified abnormalities of gait and mobility: Secondary | ICD-10-CM | POA: Diagnosis not present

## 2020-05-22 DIAGNOSIS — M79605 Pain in left leg: Secondary | ICD-10-CM | POA: Diagnosis not present

## 2020-05-29 DIAGNOSIS — M79605 Pain in left leg: Secondary | ICD-10-CM | POA: Diagnosis not present

## 2020-05-29 DIAGNOSIS — R269 Unspecified abnormalities of gait and mobility: Secondary | ICD-10-CM | POA: Diagnosis not present

## 2020-06-21 DIAGNOSIS — R269 Unspecified abnormalities of gait and mobility: Secondary | ICD-10-CM | POA: Diagnosis not present

## 2020-06-21 DIAGNOSIS — M79605 Pain in left leg: Secondary | ICD-10-CM | POA: Diagnosis not present

## 2020-07-04 DIAGNOSIS — M79605 Pain in left leg: Secondary | ICD-10-CM | POA: Diagnosis not present

## 2020-07-04 DIAGNOSIS — R269 Unspecified abnormalities of gait and mobility: Secondary | ICD-10-CM | POA: Diagnosis not present

## 2020-07-10 MED ORDER — EZETIMIBE 10 MG PO TABS: 10.0000 mg | ORAL_TABLET | Freq: Every day | ORAL | 2 refills | Status: DC

## 2020-07-18 DIAGNOSIS — M79605 Pain in left leg: Secondary | ICD-10-CM | POA: Diagnosis not present

## 2020-07-18 DIAGNOSIS — R269 Unspecified abnormalities of gait and mobility: Secondary | ICD-10-CM | POA: Diagnosis not present

## 2020-07-30 DIAGNOSIS — N1831 Chronic kidney disease, stage 3a: Secondary | ICD-10-CM | POA: Diagnosis not present

## 2020-07-30 DIAGNOSIS — I129 Hypertensive chronic kidney disease with stage 1 through stage 4 chronic kidney disease, or unspecified chronic kidney disease: Secondary | ICD-10-CM | POA: Diagnosis not present

## 2020-07-30 DIAGNOSIS — R7989 Other specified abnormal findings of blood chemistry: Secondary | ICD-10-CM | POA: Diagnosis not present

## 2020-07-30 DIAGNOSIS — E785 Hyperlipidemia, unspecified: Secondary | ICD-10-CM | POA: Diagnosis not present

## 2020-08-03 ENCOUNTER — Other Ambulatory Visit: Payer: Self-pay | Admitting: Cardiovascular Disease

## 2020-08-06 DIAGNOSIS — R82998 Other abnormal findings in urine: Secondary | ICD-10-CM | POA: Diagnosis not present

## 2020-08-06 DIAGNOSIS — J449 Chronic obstructive pulmonary disease, unspecified: Secondary | ICD-10-CM | POA: Diagnosis not present

## 2020-08-06 DIAGNOSIS — Z1212 Encounter for screening for malignant neoplasm of rectum: Secondary | ICD-10-CM | POA: Diagnosis not present

## 2020-08-06 DIAGNOSIS — I2510 Atherosclerotic heart disease of native coronary artery without angina pectoris: Secondary | ICD-10-CM | POA: Diagnosis not present

## 2020-08-06 DIAGNOSIS — E785 Hyperlipidemia, unspecified: Secondary | ICD-10-CM | POA: Diagnosis not present

## 2020-08-06 DIAGNOSIS — I129 Hypertensive chronic kidney disease with stage 1 through stage 4 chronic kidney disease, or unspecified chronic kidney disease: Secondary | ICD-10-CM | POA: Diagnosis not present

## 2020-08-06 DIAGNOSIS — R0600 Dyspnea, unspecified: Secondary | ICD-10-CM | POA: Diagnosis not present

## 2020-08-06 DIAGNOSIS — R002 Palpitations: Secondary | ICD-10-CM | POA: Diagnosis not present

## 2020-08-06 DIAGNOSIS — Z0000 Encounter for general adult medical examination without abnormal findings: Secondary | ICD-10-CM | POA: Diagnosis not present

## 2020-08-06 DIAGNOSIS — J309 Allergic rhinitis, unspecified: Secondary | ICD-10-CM | POA: Diagnosis not present

## 2020-08-06 DIAGNOSIS — N1831 Chronic kidney disease, stage 3a: Secondary | ICD-10-CM | POA: Diagnosis not present

## 2020-08-06 DIAGNOSIS — F5104 Psychophysiologic insomnia: Secondary | ICD-10-CM | POA: Diagnosis not present

## 2020-08-06 DIAGNOSIS — R7401 Elevation of levels of liver transaminase levels: Secondary | ICD-10-CM | POA: Diagnosis not present

## 2020-08-08 DIAGNOSIS — R269 Unspecified abnormalities of gait and mobility: Secondary | ICD-10-CM | POA: Diagnosis not present

## 2020-08-08 DIAGNOSIS — M79605 Pain in left leg: Secondary | ICD-10-CM | POA: Diagnosis not present

## 2020-08-16 ENCOUNTER — Other Ambulatory Visit: Payer: Self-pay | Admitting: Internal Medicine

## 2020-08-16 DIAGNOSIS — R7401 Elevation of levels of liver transaminase levels: Secondary | ICD-10-CM

## 2020-08-21 ENCOUNTER — Ambulatory Visit
Admission: RE | Admit: 2020-08-21 | Discharge: 2020-08-21 | Disposition: A | Discharge status: Home / Self Care | Payer: PPO | Source: Ambulatory Visit | Attending: Internal Medicine | Admitting: Internal Medicine

## 2020-08-21 DIAGNOSIS — R7401 Elevation of levels of liver transaminase levels: Secondary | ICD-10-CM

## 2020-08-29 DIAGNOSIS — M79605 Pain in left leg: Secondary | ICD-10-CM | POA: Diagnosis not present

## 2020-08-29 DIAGNOSIS — R269 Unspecified abnormalities of gait and mobility: Secondary | ICD-10-CM | POA: Diagnosis not present

## 2020-09-03 DIAGNOSIS — H2511 Age-related nuclear cataract, right eye: Secondary | ICD-10-CM | POA: Diagnosis not present

## 2020-09-09 ENCOUNTER — Other Ambulatory Visit: Payer: Self-pay

## 2020-09-09 ENCOUNTER — Inpatient Hospital Stay: Payer: PPO | Attending: Nurse Practitioner | Admitting: Nurse Practitioner

## 2020-09-09 MED ORDER — ROSUVASTATIN CALCIUM 40 MG PO TABS: 40.0000 mg | ORAL_TABLET | Freq: Every day | ORAL | 0 refills | Status: AC

## 2020-09-11 ENCOUNTER — Other Ambulatory Visit: Payer: Self-pay | Admitting: Internal Medicine

## 2020-09-11 ENCOUNTER — Encounter: Payer: Self-pay | Admitting: *Deleted

## 2020-09-11 ENCOUNTER — Telehealth: Payer: Self-pay | Admitting: Oncology

## 2020-09-11 DIAGNOSIS — R7401 Elevation of levels of liver transaminase levels: Secondary | ICD-10-CM

## 2020-09-11 NOTE — Telephone Encounter (Signed)
Unable to reach patient by phone.  Updated calendar was mailed with new appointment date & time per 8/3 sch msg

## 2020-09-11 NOTE — Progress Notes (Signed)
Did not show for his 8/1 f/u appointment. Scheduling message sent to reschedule for 1 month.

## 2020-09-17 DIAGNOSIS — M79605 Pain in left leg: Secondary | ICD-10-CM | POA: Diagnosis not present

## 2020-09-17 DIAGNOSIS — R269 Unspecified abnormalities of gait and mobility: Secondary | ICD-10-CM | POA: Diagnosis not present

## 2020-09-26 DIAGNOSIS — R269 Unspecified abnormalities of gait and mobility: Secondary | ICD-10-CM | POA: Diagnosis not present

## 2020-09-26 DIAGNOSIS — M79605 Pain in left leg: Secondary | ICD-10-CM | POA: Diagnosis not present

## 2020-10-07 DIAGNOSIS — M79605 Pain in left leg: Secondary | ICD-10-CM | POA: Diagnosis not present

## 2020-10-07 DIAGNOSIS — R269 Unspecified abnormalities of gait and mobility: Secondary | ICD-10-CM | POA: Diagnosis not present

## 2020-10-09 ENCOUNTER — Ambulatory Visit
Admission: RE | Admit: 2020-10-09 | Discharge: 2020-10-09 | Disposition: A | Discharge status: Home / Self Care | Payer: PPO | Source: Ambulatory Visit | Attending: Internal Medicine | Admitting: Internal Medicine

## 2020-10-09 DIAGNOSIS — R935 Abnormal findings on diagnostic imaging of other abdominal regions, including retroperitoneum: Secondary | ICD-10-CM | POA: Diagnosis not present

## 2020-10-09 DIAGNOSIS — Z9049 Acquired absence of other specified parts of digestive tract: Secondary | ICD-10-CM | POA: Diagnosis not present

## 2020-10-09 DIAGNOSIS — R7401 Elevation of levels of liver transaminase levels: Secondary | ICD-10-CM

## 2020-10-09 DIAGNOSIS — K838 Other specified diseases of biliary tract: Secondary | ICD-10-CM | POA: Diagnosis not present

## 2020-10-09 MED ORDER — GADOBENATE DIMEGLUMINE 529 MG/ML IV SOLN
18.0000 mL | Freq: Once | INTRAVENOUS | Status: AC | PRN
  Administered 2020-10-09: 18 mL via INTRAVENOUS

## 2020-10-10 ENCOUNTER — Encounter: Payer: Self-pay | Admitting: Nurse Practitioner

## 2020-10-10 ENCOUNTER — Other Ambulatory Visit: Payer: Self-pay

## 2020-10-10 ENCOUNTER — Inpatient Hospital Stay: Payer: PPO | Attending: Nurse Practitioner | Admitting: Nurse Practitioner

## 2020-10-10 VITALS — BP 123/79 | HR 98 | Temp 98.1°F | Resp 18 | Ht 71.0 in | Wt 195.0 lb

## 2020-10-10 DIAGNOSIS — Z8589 Personal history of malignant neoplasm of other organs and systems: Secondary | ICD-10-CM | POA: Diagnosis not present

## 2020-10-10 DIAGNOSIS — C493 Malignant neoplasm of connective and soft tissue of thorax: Secondary | ICD-10-CM | POA: Diagnosis not present

## 2020-10-10 DIAGNOSIS — Z8572 Personal history of non-Hodgkin lymphomas: Secondary | ICD-10-CM | POA: Insufficient documentation

## 2020-10-10 DIAGNOSIS — E785 Hyperlipidemia, unspecified: Secondary | ICD-10-CM | POA: Insufficient documentation

## 2020-10-10 DIAGNOSIS — C8290 Follicular lymphoma, unspecified, unspecified site: Secondary | ICD-10-CM | POA: Diagnosis not present

## 2020-10-10 DIAGNOSIS — I2510 Atherosclerotic heart disease of native coronary artery without angina pectoris: Secondary | ICD-10-CM | POA: Insufficient documentation

## 2020-10-10 DIAGNOSIS — I252 Old myocardial infarction: Secondary | ICD-10-CM | POA: Insufficient documentation

## 2020-10-10 NOTE — Progress Notes (Signed)
  [facility] OFFICE PROGRESS NOTE   Diagnosis: Non-Hodgkin's lymphoma, leiomyosarcoma  INTERVAL HISTORY:   Joseph Soto returns for follow-up.  He feels well.  No fevers or sweats.  He has a good appetite.  He has not noticed any enlarged lymph nodes.  No change over the chest wall.  No nausea or vomiting.  No change in bowel habits.  Objective:  Vital signs in last 24 hours:  Blood pressure 123/79, pulse 98, temperature 98.1 F (36.7 C), temperature source Oral, resp. rate 18, height '5\' 11"'$  (1.803 m), weight 195 lb (88.5 kg), SpO2 100 %.    HEENT: Persistent approximate 2 cm fullness angle of left mandible overlying the carotid pulse, similar though less prominent fullness angle of the right mandible. Lymphatics: No palpable cervical, supraclavicular, axillary or inguinal lymph nodes. Resp: Lungs clear bilaterally. Cardio: Regular rate and rhythm. GI: Abdomen soft and nontender.  No hepatosplenomegaly. Vascular: No leg edema. Skin: Anterior chest scar without evidence of recurrent tumor.   Lab Results:  Lab Results  Component Value Date   WBC 9.6 06/07/2019   HGB 12.6 (L) 06/07/2019   HCT 38.3 06/07/2019   MCV 89 06/07/2019   PLT 298 06/07/2019   NEUTROABS 8.4 (H) 12/20/2015    Imaging:  No results found.  Medications: I have reviewed the patient's current medications.  Assessment/Plan: Leiomyosarcoma of the anterior chest wall, status post an excisional biopsy on 11/11/2015 confirming a leiomyosarcoma Positive lateral surgical margin, 2 x 0.6 cm, up to 5 mitoses per 10 high-powered fields Reexcision 12/23/2015-no leiomyosarcoma, negative resection margins    2.   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   History of coronary artery disease/myocardial infarction-status post coronary artery bypass surgery in 2007, status post placement of a RCA stent May 2018    4.   Hyperlipidemia   5.  Left high  anterior cervical node first noted December 2019  Disposition: Joseph Soto appears well.  There is no clinical evidence for progression of the lymphoma and no evidence of recurrence of the leiomyosarcoma.  He has persistent fullness overlying the left carotid, unchanged.  He will return for follow-up in 9 months.  We are available to see him sooner if needed.    Nathinel Heckman ANP/GNP-BC   10/10/2020  11:11 AM

## 2020-10-15 DIAGNOSIS — M79605 Pain in left leg: Secondary | ICD-10-CM | POA: Diagnosis not present

## 2020-10-15 DIAGNOSIS — R269 Unspecified abnormalities of gait and mobility: Secondary | ICD-10-CM | POA: Diagnosis not present

## 2020-10-22 DIAGNOSIS — R269 Unspecified abnormalities of gait and mobility: Secondary | ICD-10-CM | POA: Diagnosis not present

## 2020-10-22 DIAGNOSIS — M79605 Pain in left leg: Secondary | ICD-10-CM | POA: Diagnosis not present

## 2020-10-29 ENCOUNTER — Other Ambulatory Visit: Payer: Self-pay | Admitting: Cardiovascular Disease

## 2020-10-30 ENCOUNTER — Other Ambulatory Visit: Payer: Self-pay | Admitting: Cardiovascular Disease

## 2020-11-05 DIAGNOSIS — M79605 Pain in left leg: Secondary | ICD-10-CM | POA: Diagnosis not present

## 2020-11-05 DIAGNOSIS — R269 Unspecified abnormalities of gait and mobility: Secondary | ICD-10-CM | POA: Diagnosis not present

## 2020-11-13 ENCOUNTER — Other Ambulatory Visit: Payer: Self-pay | Admitting: Cardiovascular Disease

## 2020-11-13 DIAGNOSIS — L720 Epidermal cyst: Secondary | ICD-10-CM | POA: Diagnosis not present

## 2020-11-13 DIAGNOSIS — Z85828 Personal history of other malignant neoplasm of skin: Secondary | ICD-10-CM | POA: Diagnosis not present

## 2020-11-25 DIAGNOSIS — L720 Epidermal cyst: Secondary | ICD-10-CM | POA: Diagnosis not present

## 2020-11-25 DIAGNOSIS — Z85828 Personal history of other malignant neoplasm of skin: Secondary | ICD-10-CM | POA: Diagnosis not present

## 2020-11-30 ENCOUNTER — Other Ambulatory Visit: Payer: Self-pay | Admitting: Cardiovascular Disease

## 2020-12-16 DIAGNOSIS — L821 Other seborrheic keratosis: Secondary | ICD-10-CM | POA: Diagnosis not present

## 2020-12-16 DIAGNOSIS — D1801 Hemangioma of skin and subcutaneous tissue: Secondary | ICD-10-CM | POA: Diagnosis not present

## 2020-12-16 DIAGNOSIS — L812 Freckles: Secondary | ICD-10-CM | POA: Diagnosis not present

## 2020-12-16 DIAGNOSIS — D692 Other nonthrombocytopenic purpura: Secondary | ICD-10-CM | POA: Diagnosis not present

## 2020-12-16 DIAGNOSIS — L309 Dermatitis, unspecified: Secondary | ICD-10-CM | POA: Diagnosis not present

## 2020-12-16 DIAGNOSIS — Z85828 Personal history of other malignant neoplasm of skin: Secondary | ICD-10-CM | POA: Diagnosis not present

## 2020-12-16 DIAGNOSIS — L570 Actinic keratosis: Secondary | ICD-10-CM | POA: Diagnosis not present

## 2020-12-17 ENCOUNTER — Other Ambulatory Visit: Payer: Self-pay | Admitting: Cardiovascular Disease

## 2021-01-08 DIAGNOSIS — N183 Chronic kidney disease, stage 3 unspecified: Secondary | ICD-10-CM | POA: Diagnosis not present

## 2021-01-08 DIAGNOSIS — I10 Essential (primary) hypertension: Secondary | ICD-10-CM | POA: Diagnosis not present

## 2021-01-08 DIAGNOSIS — E785 Hyperlipidemia, unspecified: Secondary | ICD-10-CM | POA: Diagnosis not present

## 2021-01-08 DIAGNOSIS — I129 Hypertensive chronic kidney disease with stage 1 through stage 4 chronic kidney disease, or unspecified chronic kidney disease: Secondary | ICD-10-CM | POA: Diagnosis not present

## 2021-02-02 ENCOUNTER — Other Ambulatory Visit: Payer: Self-pay | Admitting: Cardiovascular Disease

## 2021-02-07 DIAGNOSIS — E785 Hyperlipidemia, unspecified: Secondary | ICD-10-CM | POA: Diagnosis not present

## 2021-02-07 DIAGNOSIS — N183 Chronic kidney disease, stage 3 unspecified: Secondary | ICD-10-CM | POA: Diagnosis not present

## 2021-02-07 DIAGNOSIS — I129 Hypertensive chronic kidney disease with stage 1 through stage 4 chronic kidney disease, or unspecified chronic kidney disease: Secondary | ICD-10-CM | POA: Diagnosis not present

## 2021-02-07 DIAGNOSIS — I10 Essential (primary) hypertension: Secondary | ICD-10-CM | POA: Diagnosis not present

## 2021-02-12 ENCOUNTER — Other Ambulatory Visit: Payer: Self-pay | Admitting: Cardiovascular Disease

## 2021-02-20 DIAGNOSIS — M545 Low back pain, unspecified: Secondary | ICD-10-CM | POA: Diagnosis not present

## 2021-02-20 DIAGNOSIS — G8929 Other chronic pain: Secondary | ICD-10-CM | POA: Diagnosis not present

## 2021-02-25 DIAGNOSIS — H903 Sensorineural hearing loss, bilateral: Secondary | ICD-10-CM | POA: Diagnosis not present

## 2021-02-27 ENCOUNTER — Telehealth: Payer: Self-pay | Admitting: Cardiovascular Disease

## 2021-03-03 ENCOUNTER — Other Ambulatory Visit: Payer: Self-pay

## 2021-03-03 MED ORDER — METOPROLOL SUCCINATE ER 25 MG PO TB24: 25.0000 mg | ORAL_TABLET | Freq: Every day | ORAL | 1 refills | Status: DC

## 2021-03-03 NOTE — Telephone Encounter (Signed)
Pt c/o medication issue:   1. Name of Medication  Metoprolol   2. How are you currently taking this medication (dosage and times per day)?   3. Are you having a reaction (difficulty breathing--STAT)? Na   4. What is your medication issue? Pt sent this Via Mychart to the sching pool:   Why am I getting only 15 pills and 0 refills on my metoprolol? This is very inconvenient getting more pills every 2 weeks.    Joseph Soto "Tom"  Patient Appointment Schedule Request Pool 3 days ago   I need the Metoprolol now. [facility] a [facility]ill and were told no refills until an appointment. I wlll ask them to call again. Please alert who fulfills requests that [facility] will be calling for my Rx .

## 2021-03-06 DIAGNOSIS — H6123 Impacted cerumen, bilateral: Secondary | ICD-10-CM | POA: Diagnosis not present

## 2021-03-10 DIAGNOSIS — E785 Hyperlipidemia, unspecified: Secondary | ICD-10-CM | POA: Diagnosis not present

## 2021-03-10 DIAGNOSIS — I10 Essential (primary) hypertension: Secondary | ICD-10-CM | POA: Diagnosis not present

## 2021-03-10 DIAGNOSIS — J439 Emphysema, unspecified: Secondary | ICD-10-CM | POA: Diagnosis not present

## 2021-03-10 DIAGNOSIS — I2510 Atherosclerotic heart disease of native coronary artery without angina pectoris: Secondary | ICD-10-CM | POA: Diagnosis not present

## 2021-03-10 DIAGNOSIS — G8929 Other chronic pain: Secondary | ICD-10-CM | POA: Diagnosis not present

## 2021-03-11 ENCOUNTER — Other Ambulatory Visit: Payer: Self-pay | Admitting: Internal Medicine

## 2021-03-11 DIAGNOSIS — I2510 Atherosclerotic heart disease of native coronary artery without angina pectoris: Secondary | ICD-10-CM

## 2021-03-11 DIAGNOSIS — E785 Hyperlipidemia, unspecified: Secondary | ICD-10-CM

## 2021-03-17 DIAGNOSIS — M7918 Myalgia, other site: Secondary | ICD-10-CM | POA: Diagnosis not present

## 2021-04-02 ENCOUNTER — Other Ambulatory Visit: Payer: Self-pay | Admitting: Cardiovascular Disease

## 2021-04-08 DIAGNOSIS — E785 Hyperlipidemia, unspecified: Secondary | ICD-10-CM | POA: Diagnosis not present

## 2021-04-08 DIAGNOSIS — I129 Hypertensive chronic kidney disease with stage 1 through stage 4 chronic kidney disease, or unspecified chronic kidney disease: Secondary | ICD-10-CM | POA: Diagnosis not present

## 2021-04-08 DIAGNOSIS — N183 Chronic kidney disease, stage 3 unspecified: Secondary | ICD-10-CM | POA: Diagnosis not present

## 2021-04-08 DIAGNOSIS — I10 Essential (primary) hypertension: Secondary | ICD-10-CM | POA: Diagnosis not present

## 2021-04-14 ENCOUNTER — Ambulatory Visit
Admission: RE | Admit: 2021-04-14 | Discharge: 2021-04-14 | Disposition: A | Discharge status: Home / Self Care | Payer: PPO | Source: Ambulatory Visit | Attending: Registered Nurse | Admitting: Registered Nurse

## 2021-04-14 ENCOUNTER — Other Ambulatory Visit: Payer: Self-pay | Admitting: Registered Nurse

## 2021-04-14 DIAGNOSIS — I10 Essential (primary) hypertension: Secondary | ICD-10-CM | POA: Diagnosis not present

## 2021-04-14 DIAGNOSIS — Z1152 Encounter for screening for COVID-19: Secondary | ICD-10-CM | POA: Diagnosis not present

## 2021-04-14 DIAGNOSIS — R0602 Shortness of breath: Secondary | ICD-10-CM

## 2021-04-14 DIAGNOSIS — B001 Herpesviral vesicular dermatitis: Secondary | ICD-10-CM | POA: Diagnosis not present

## 2021-04-14 DIAGNOSIS — J449 Chronic obstructive pulmonary disease, unspecified: Secondary | ICD-10-CM | POA: Diagnosis not present

## 2021-04-14 DIAGNOSIS — R5383 Other fatigue: Secondary | ICD-10-CM | POA: Diagnosis not present

## 2021-04-14 DIAGNOSIS — R059 Cough, unspecified: Secondary | ICD-10-CM | POA: Diagnosis not present

## 2021-04-14 DIAGNOSIS — J439 Emphysema, unspecified: Secondary | ICD-10-CM | POA: Diagnosis not present

## 2021-05-30 DIAGNOSIS — J069 Acute upper respiratory infection, unspecified: Secondary | ICD-10-CM | POA: Diagnosis not present

## 2021-05-30 DIAGNOSIS — R051 Acute cough: Secondary | ICD-10-CM | POA: Diagnosis not present

## 2021-05-30 DIAGNOSIS — R0602 Shortness of breath: Secondary | ICD-10-CM | POA: Diagnosis not present

## 2021-05-30 DIAGNOSIS — J439 Emphysema, unspecified: Secondary | ICD-10-CM | POA: Diagnosis not present

## 2021-05-30 DIAGNOSIS — Z1152 Encounter for screening for COVID-19: Secondary | ICD-10-CM | POA: Diagnosis not present

## 2021-05-30 DIAGNOSIS — I10 Essential (primary) hypertension: Secondary | ICD-10-CM | POA: Diagnosis not present

## 2021-06-02 ENCOUNTER — Ambulatory Visit: Payer: PPO | Admitting: Internal Medicine

## 2021-06-02 ENCOUNTER — Encounter: Payer: Self-pay | Admitting: Internal Medicine

## 2021-06-02 VITALS — BP 118/72 | HR 82 | Ht 71.0 in | Wt 207.2 lb

## 2021-06-02 DIAGNOSIS — J449 Chronic obstructive pulmonary disease, unspecified: Secondary | ICD-10-CM

## 2021-06-02 MED ORDER — BREZTRI AEROSPHERE 160-9-4.8 MCG/ACT IN AERO
2.0000 | INHALATION_SPRAY | Freq: Two times a day (BID) | RESPIRATORY_TRACT | 5 refills | Status: DC
Start: 2021-06-02 — End: 2021-12-16

## 2021-06-02 NOTE — Progress Notes (Signed)
? ?      Joseph Soto    [id number]    11-22-1945 ? ?Primary Care Physician:Paterson, Ermalene Searing, MD ?Date of Appointment: 06/02/2021 ?Established Patient Visit ? ?Chief complaint:   ?Chief Complaint  ?Patient presents with  ? Follow-up  ?  48yrf/u for COPD. States his SOB has increased over the past few weeks as well as fatigue.  ? ? ? ?HPI: ?Joseph GERMONDis a 76y.o. man with COPD.  ? ?Interval Updates: ?Here for follow up after one year. Has been on trelegy. Currently being treated with abx by PCP ? ?Pollen counts have been high and he has a history of allergies. Denies allergy symptoms currently but has taken flonase prn in the past.  ? ?Feels that symptoms have progressed over the past year. Much more short of breath with exertion. No hospitalizations or ED visits. ? ?Having trouble with chronic headaches flaring up.  ? ?I have reviewed the patient's family social and past medical history and updated as appropriate.  ? ?Past Medical History:  ?Diagnosis Date  ? Allergic rhinitis   ? grass, dust  ? Buttock pain   ? CAD (coronary artery disease)   ? Last cath 2010 per Dr. TDoreatha Lewwith diffuse multivessel disease, small caliber vessels not felt to be suitable for PCI  ? Chronic left hip pain   ? Colon polyps   ? pt says a colonoscopy did not confirm this, said there was nothing there  ? Diverticulosis of sigmoid colon 2010  ? colonoscopy - [facility]  ? Dizziness   ? 1 episode   ? Fibromyalgia   ? GERD (gastroesophageal reflux disease)   ? Head pain   ? "not chronic; I have myofasial tightening in my head that causes pain in my skull" (06/17/2016)  ? HLD (hyperlipidemia)   ? Hypertension   ? Le[place]omy[place] of chest [place] ([facility] 11/[place]  ? surgical excision alone recommended (per notes from Dr. PShon Batonoffice)  ? [place]el[place] ([facility]   ? "found it in my chest when they did the excision"  ? [place] [place]  ? [place] [place]  ? ? ?Past Surgical History:  ?Procedure  Laterality Date  ? BALLOON SINUPLASTY  2014  ? CARDIAC CATHETERIZATION  ~ 2008  ? "Dr. TDoreatha Lew  ? CATARACT EXTRACTION W/ INTRAOCULAR LENS IMPLANT Right   ? colonscopy  2010  ? CORONARY ANGIOPLASTY WITH STENT PLACEMENT  06/17/2016  ? CORONARY ARTERY BYPASS GRAFT  5/07  ? x5. LV dysfunction.   ? CORONARY BALLOON ANGIOPLASTY N/A 06/14/2019  ? Procedure: CORONARY BALLOON ANGIOPLASTY;  Surgeon: MBurnell Blanks MD;  [facility];  Service: Cardiovascular;  Laterality: N/A;  ? CORONARY STENT INTERVENTION N/A 06/17/2016  ? Procedure: Coronary Stent Intervention;  Surgeon: MBurnell Blanks MD;  [facility];  Service: Cardiovascular;  Laterality: N/A;  ? epidural steroid injections    ? [place] ? LEFT HEART CATH AND CORS/GRAFTS ANGIOGRAPHY N/A 06/17/2016  ? Procedure: Left Heart Cath and Cors/Grafts Angiography;  Surgeon: MBurnell Blanks MD;  [facility];  Service: Cardiovascular;  Laterality: N/A;  ? LEFT HEART CATH AND CORS/GRAFTS ANGIOGRAPHY N/A 06/14/2019  ? Procedure: LEFT HEART CATH AND CORS/GRAFTS ANGIOGRAPHY;  Surgeon: MBurnell Blanks MD;  [facility];  Service: Cardiovascular;  Laterality: N/A;  ? MASS EXCISION N/A 11/11/2015  ? Procedure: EXCISION OF MID CHEST  MASS;  Surgeon: Judeth Horn, MD;  Location: [facility];  Service: General;  Laterality: N/A;  ? MASS EXCISION N/A 12/23/2015  ? Procedure: REEXCISION OF CHEST WALL TUMOR SITE;  Surgeon: Judeth Horn, MD;  Location: [facility];  Service: General;  Laterality: N/A;  ? POPLITEAL SYNOVIAL CYST EXCISION Right   ? TONSILLECTOMY  1952  ? & Adenoidectomy  ? ? ?Family History  ?Problem Relation Age of Onset  ? Heart disease Father   ? Heart attack Father   ? Congestive Heart Failure Father   ? COPD Father   ? Dementia Mother   ? Cancer Paternal Grandfather   ?     ? type  ? ? ?Social History  ? ?Occupational History  ?  Occupation: retired  ?Tobacco Use  ? Smoking status: Former  ?  Packs/day: 2.00  ?  Years: 30.00  ?  Pack years: 60.00  ?  Types: Cigarettes  ?  Quit date: 05/16/2000  ?  Years since quitting: 21.0  ? Smokeless tobacco: Never  ? Tobacco comments:  ?  "quit 06/2005"  ?Vaping Use  ? Vaping Use: Never used  ?Substance and Sexual Activity  ? Alcohol use: No  ?  Alcohol/week: 0.0 standard drinks  ? Drug use: No  ? Sexual activity: Not on file  ? ? ? ?Physical Exam: ?Blood pressure 118/72, pulse 82, height '5\' 11"'$  (1.803 m), weight 207 lb 3.2 oz (94 kg), SpO2 96 %. ? ?Gen:      No acute distress ?ENT:  no nasal polyps, mucus membranes moist ?Lungs:   diminished, no wheezes or crackles ?CV:         Regular rate and rhythm; no murmurs, rubs, or gallops.  No pedal edema ? ? ?Data Reviewed: ?Imaging: ?I have personally reviewed the chest xray March 2023 - hyperinflation and interstitial changes consistent with COPD.  ? ?PFTs: ? ? ?  Latest Ref Rng & Units 01/26/2014  ?  3:47 PM  ?PFT Results  ?FVC-Pre L 6.05    ?FVC-Predicted Pre % 136    ?FVC-Post L 5.54    ?FVC-Predicted Post % 125    ?Pre FEV1/FVC % % 47    ?Post FEV1/FCV % % 49    ?FEV1-Pre L 2.85    ?FEV1-Predicted Pre % 87    ?FEV1-Post L 2.73    ?DLCO uncorrected ml/min/mmHg 21.64    ?DLCO UNC% % 66    ?DLCO corrected ml/min/mmHg 21.64    ?DLCO COR %Predicted % 66    ?DLVA Predicted % 61    ?TLC L 10.32    ?TLC % Predicted % 146    ?RV % Predicted % 171    ? ?I have personally reviewed the patient's PFTs and mild airflow limitation with hyperinflation and air trapping.  ? ?Labs: ?Lab Results  ?Component Value Date  ? WBC 9.6 06/07/2019  ? HGB 12.6 (L) 06/07/2019  ? HCT 38.3 06/07/2019  ? MCV 89 06/07/2019  ? PLT 298 06/07/2019  ? ? ? ?Immunization status: ?Immunization History  ?Administered Date(s) Administered  ? Influenza Split 11/27/2019  ? Influenza, High Dose Seasonal PF 11/08/2016  ? Influenza-Unspecified 10/16/2010, 11/11/2015, 11/09/2017  ? PFIZER(Purple  Top)SARS-COV-2 Vaccination 03/19/2019, 04/13/2019, 10/11/2019  ? Pneumococcal Conjugate-13 02/09/2014  ? Pneumococcal Polysaccharide-23 02/10/2012  ? ? ?External Records Personally Reviewed: PCP ? ?Assessment:  ?COPD mild FEV1 87% with progression of symptoms ? ?Plan/Recommendations: ?Will repeat PFTs.  ?Stop trelegy, switch to breztri given worsening  symptoms.  ?Continue albuterol ?He does not feel his current symptoms are related to allergies. Would revisit if symptoms persist.  ?He will call us if current symptoms do not resolve with abx prescribed by PCP.  ? ?Return to Care: ?Return in about 3 months (around 09/01/2021). ? ? ?Lenice Llamas, MD ?Pulmonary and Critical Care Medicine ?[place] ?[phone] ? ? ? ? ? ?

## 2021-06-02 NOTE — Patient Instructions (Addendum)
Please schedule follow up scheduled with myself in 3 months.  If my schedule is not open yet, we will contact you with a reminder closer to that time. Please call [phone] if you haven't heard from [place] a month before.  ? ?Before your next visit I would like you to have: ?Spirometry/DLCO - next available, will contact with results.  ? ?Stop trelegy and switch to [employer]. 2 puffs twice a day, gargle after use.  ? ? ?

## 2021-06-23 ENCOUNTER — Telehealth: Payer: Self-pay | Admitting: Internal Medicine

## 2021-06-23 MED ORDER — PREDNISONE 20 MG PO TABS: 40.0000 mg | ORAL_TABLET | Freq: Every day | ORAL | 0 refills | Status: DC

## 2021-06-23 NOTE — Telephone Encounter (Signed)
Fine to try prednisone 40 mg x 5 days for copd exacerbation. Can also offer him nebulizer machine with albuterol nebs every 6 hours as needed to help with wheezing and dyspnea in addition to breztri.  ?

## 2021-06-23 NOTE — Telephone Encounter (Signed)
Called the pt on mobile number and unable to get through- it asks for a mailbox number- tried entering phone number again and this did not work. I called number listed for his home and there was no answer- LMTCB. ?

## 2021-06-23 NOTE — Telephone Encounter (Signed)
Spoke with the pt and notified of response per Dr Shearon Stalls  ?He verbalized understanding  ?He wants to hold off on nebs and just take the albuterol  ?Rx sent to pharm  ?He will call back if not improving  ?

## 2021-06-23 NOTE — Telephone Encounter (Signed)
Spoke with the pt  ?He states having increased DOE over the past few wks  ?He has been taking Breztri since ov 06/02/21 and he feels breathing has been progressively worse  ?He is doing some wheezing  ?Gets winded walking room to room at home  ?He has minimal cough with white sputum  ?Offered appt and he refused, sees cards tomorrow  ?He states willing to take pred if Dr Shearon Stalls thinks that this would help  ?Denies any fevers, chills, aches, sweats  ?He has tried albuterol inhaler without relief  ?Please advise thanks! ? ?Allergies  ?Allergen Reactions  ? Niacin And Related Nausea And Vomiting  ? Niaspan [Niacin Er] Nausea And Vomiting and Other (See Comments)  ?  Stomach intolerance  ? ? ?

## 2021-06-24 NOTE — Progress Notes (Signed)
? ? ?Chief Complaint  ?Patient presents with  ? Follow-up  ?  CAD  ?  ?History of Present Illness: 76 yo male with history of CAD s/p CABG 2007, Non-hodgkins Lymphoma, ischemic cardiomyopathy and HLD here today for cardiac follow up. He had a 4V CABG in 2007. Cath in 2010 showed occluded SVG to Diagonal and occluded SVG to PDA/PLA with patent LIMA graft to the  LAD. There was diffuse disease in distal vessels felt best to be managed medically. 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 Echo April 2018 with LVEF=40-45% and hypokinesis of the anteroseptal wall and apex. Nuclear stress test April 2018 with possible ischemia. Cardiac cath 06/17/16 with patent LIMA to LAD, all other grafts known to be occluded. Severe stenosis in the proximal RCA treated with a drug eluting stent. He was seen in our office August 2019 by Lyda Jester, PA-C with c/o dyspnea with exertion. Cardiac monitor with sinus rhythm, rare PVCs, rare PACs. Nuclear stress test September 2019 without ischemia. Echo September 2019 with LVEF=40%, trivial MR. He was tried on Cozaar in October 2019 but did not tolerate due to dizziness and presumed hypotension.  He was seen in April 2021 and reported dyspnea with exertion. Cardiac cath 06/14/19 showed patent LIMA to LAD with known occlusion of the mid LAD, patent RCA stent. There was a moderate left main stenosis and severe proximal Circumflex stenosis in a moderate caliber vessel. I attempted PCI of the Circumflex which was difficult. Balloon angioplasty of the ostial Circumflex with no stent placement. Plans for medical management of CAD and possible referral for redo CABG if his symptoms could not be controlled with medical therapy. He has started cyclodextrin which he found online with independent research and is hopeful that this will reverse some of his CAD. This is approved for use in [place].  ? ?He is here today for follow up. The patient denies any chest pain,  dyspnea, palpitations, lower extremity edema, orthopnea, PND, dizziness, near syncope or syncope.  ? ? ?Primary Care Physician: Donnajean Lopes, MD ? ?Past Medical History:  ?Diagnosis Date  ? Allergic rhinitis   ? grass, dust  ? Buttock pain   ? CAD (coronary artery disease)   ? Last cath 2010 per Dr. Doreatha Lew with diffuse multivessel disease, small caliber vessels not felt to be suitable for PCI  ? Chronic left hip pain   ? Colon polyps   ? pt says a colonoscopy did not confirm this, said there was nothing there  ? Diverticulosis of sigmoid colon 2010  ? colonoscopy - [facility]  ? Dizziness   ? 1 episode   ? Fibromyalgia   ? GERD (gastroesophageal reflux disease)   ? Head pain   ? "not chronic; I have myofasial tightening in my head that causes pain in my skull" (06/17/2016)  ? HLD (hyperlipidemia)   ? Hypertension   ? Leiomyosarcoma of chest wall ([place]) 11/2015  ? surgical excision alone recommended (per notes from Dr. Shon Baton office)  ? Myeloma ([place])   ? "found it in my chest when they did the excision"  ? Myocardial infarction ([place]) 06/2005  ? Non Hodgkin's lymphoma ([place]) dx'd 06/2005  ? ? ?Past Surgical History:  ?Procedure Laterality Date  ? BALLOON SINUPLASTY  2014  ? CARDIAC CATHETERIZATION  ~ 2008  ? "Dr. Doreatha Lew"  ? CATARACT EXTRACTION W/ INTRAOCULAR LENS IMPLANT Right   ? colonscopy  2010  ? CORONARY ANGIOPLASTY WITH STENT  PLACEMENT  06/17/2016  ? CORONARY ARTERY BYPASS GRAFT  5/07  ? x5. LV dysfunction.   ? CORONARY BALLOON ANGIOPLASTY N/A 06/14/2019  ? Procedure: CORONARY BALLOON ANGIOPLASTY;  Surgeon: Burnell Blanks, MD;  Location: [place] [facility];  Service: Cardiovascular;  Laterality: N/A;  ? CORONARY STENT INTERVENTION N/A 06/17/2016  ? Procedure: Coronary Stent Intervention;  Surgeon: Burnell Blanks, MD;  Location: [place] [facility];  Service: Cardiovascular;  Laterality: N/A;  ? epidural steroid injections    ? [place]  ? LEFT HEART CATH AND  CORS/GRAFTS ANGIOGRAPHY N/A 06/17/2016  ? Procedure: Left Heart Cath and Cors/Grafts Angiography;  Surgeon: Burnell Blanks, MD;  Location: [place] [facility];  Service: Cardiovascular;  Laterality: N/A;  ? LEFT HEART CATH AND CORS/GRAFTS ANGIOGRAPHY N/A 06/14/2019  ? Procedure: LEFT HEART CATH AND CORS/GRAFTS ANGIOGRAPHY;  Surgeon: Burnell Blanks, MD;  Location: [place] [facility];  Service: Cardiovascular;  Laterality: N/A;  ? MASS EXCISION N/A 11/11/2015  ? Procedure: EXCISION OF MID CHEST  MASS;  Surgeon: Judeth Horn, MD;  Location: [place];  Service: General;  Laterality: N/A;  ? MASS EXCISION N/A 12/23/2015  ? Procedure: REEXCISION OF CHEST WALL TUMOR SITE;  Surgeon: Judeth Horn, MD;  Location: [facility];  Service: General;  Laterality: N/A;  ? POPLITEAL SYNOVIAL CYST EXCISION Right   ? TONSILLECTOMY  1952  ? & Adenoidectomy  ? ? ?Current Outpatient Medications  ?Medication Sig Dispense Refill  ? amLODipine (NORVASC) 5 MG tablet Take 5 mg by mouth daily.    ? aspirin EC 81 MG tablet Take 81 mg by mouth daily.    ? Budeson-Glycopyrrol-Formoterol (BREZTRI AEROSPHERE) 160-9-4.8 MCG/ACT AERO Inhale 2 puffs into the lungs in the morning and at bedtime. 10.7 g 5  ? cefdinir (OMNICEF) 300 MG capsule Take 300 mg by mouth 2 (two) times daily.    ? cholecalciferol (VITAMIN D) 1000 units tablet Take 1,000 Units by mouth in the morning and at bedtime.     ? clopidogrel (PLAVIX) 75 MG tablet TAKE 1 TABLET BY MOUTH DAILY WITH BREAKFAST. 30 tablet 11  ? Coenzyme Q10 300 MG CAPS Take 300 mg by mouth in the morning and at bedtime.     ? cyclobenzaprine (FLEXERIL) 10 MG tablet Take 10 mg by mouth 2 (two) times daily.    ? DULoxetine (CYMBALTA) 60 MG capsule Take 1 capsule by mouth daily.    ? ezetimibe (ZETIA) 10 MG tablet Take 1 tablet (10 mg total) by mouth daily. Please keep upcoming appointment for future refills. Thank you 90 tablet 1  ? fluticasone (FLONASE) 50 MCG/ACT nasal  spray Place 1 spray into both nostrils daily as needed.    ? isosorbide mononitrate (IMDUR) 30 MG 24 hr tablet Take 1 tablet (30 mg total) by mouth daily. Please schedule yearly appointment for future refills. 1st attempt. Thank you 45 tablet 0  ? metoprolol succinate (TOPROL-XL) 25 MG 24 hr tablet Take 1 tablet (25 mg total) by mouth daily. 90 tablet 1  ? naltrexone (DEPADE) 50 MG tablet Take 50 mg by mouth 2 (two) times daily.     ? nitroGLYCERIN (NITROSTAT) 0.4 MG SL tablet Place 1 tablet (0.4 mg total) under the tongue every 5 (five) minutes as needed for chest pain. 25 tablet 3  ? predniSONE (DELTASONE) 10 MG tablet Take 10 mg by mouth daily with breakfast.    ? rosuvastatin (CRESTOR) 40 MG tablet Take  1 tablet (40 mg total) by mouth daily. 90 tablet 0  ? triamcinolone (NASACORT) 55 MCG/ACT AERO nasal inhaler Place into the nose. Place into the nose.    ? TURMERIC CURCUMIN PO Take 1,500 mg by mouth in the morning and at bedtime.    ? valACYclovir (VALTREX) 1000 MG tablet Take 2,000 mg by mouth daily.    ? albuterol (VENTOLIN HFA) 108 (90 Base) MCG/ACT inhaler Inhale 2 puffs into the lungs every 6 (six) hours as needed. (Patient not taking: Reported on 06/25/2021)    ? ?No current facility-administered medications for this visit.  ? ? ?Allergies  ?Allergen Reactions  ? Niacin And Related Nausea And Vomiting  ? Niaspan [Niacin Er] Nausea And Vomiting and Other (See Comments)  ?  Stomach intolerance  ? ? ?Social History  ? ?Socioeconomic History  ? Marital status: Married  ?  Spouse name: [name]  ? Number of children: 2  ? Years of education: college  ? Highest education level: Not on file  ?Occupational History  ? Occupation: retired  ?Tobacco Use  ? Smoking status: Former  ?  Packs/day: 2.00  ?  Years: 30.00  ?  Pack years: 60.00  ?  Types: Cigarettes  ?  Quit date: 05/16/2000  ?  Years since quitting: 21.1  ? Smokeless tobacco: Never  ? Tobacco comments:  ?  "quit 06/2005"  ?Vaping Use  ? Vaping Use: Never used   ?Substance and Sexual Activity  ? Alcohol use: No  ?  Alcohol/week: 0.0 standard drinks  ? Drug use: No  ? Sexual activity: Not on file  ?Other Topics Concern  ? Not on file  ?Social History Narrative  ? Married,

## 2021-06-25 ENCOUNTER — Ambulatory Visit: Payer: PPO | Admitting: Cardiovascular Disease

## 2021-06-25 ENCOUNTER — Encounter: Payer: Self-pay | Admitting: Cardiovascular Disease

## 2021-06-25 VITALS — BP 112/66 | HR 78 | Ht 71.0 in | Wt 208.4 lb

## 2021-06-25 DIAGNOSIS — I255 Ischemic cardiomyopathy: Secondary | ICD-10-CM | POA: Diagnosis not present

## 2021-06-25 DIAGNOSIS — I25708 Atherosclerosis of coronary artery bypass graft(s), unspecified, with other forms of angina pectoris: Secondary | ICD-10-CM | POA: Diagnosis not present

## 2021-06-25 DIAGNOSIS — E785 Hyperlipidemia, unspecified: Secondary | ICD-10-CM | POA: Diagnosis not present

## 2021-06-25 NOTE — Patient Instructions (Signed)
Medication Instructions:  ?No changes ?*If you need a refill on your cardiac medications before your next appointment, please call your pharmacy* ? ? ?Lab Work: ?none ?If you have labs (blood work) drawn today and your tests are completely normal, you will receive your results only by: ?MyChart Message (if you have MyChart) OR ?A paper copy in the mail ?If you have any lab test that is abnormal or we need to change your treatment, we will call you to review the results. ? ? ?Testing/Procedures: ?Your physician has requested that you have an echocardiogram. Echocardiography is a painless test that uses sound waves to create images of your heart. It provides your doctor with information about the size and shape of your heart and how well your heart?s chambers and valves are working. This procedure takes approximately one hour. There are no restrictions for this procedure. ? ? ?Follow-Up: ?At [facility], you and your health needs are our priority.  As part of our continuing mission to provide you with exceptional heart care, we have created designated Provider Care Teams.  These Care Teams include your primary Cardiologist (physician) and Advanced Practice Providers (APPs -  Physician Assistants and Nurse Practitioners) who all work together to provide you with the care you need, when you need it. ? ?We recommend signing up for the patient portal called "MyChart".  Sign up information is provided on this After Visit Summary.  MyChart is used to connect with patients for Virtual Visits (Telemedicine).  Patients are able to view lab/test results, encounter notes, upcoming appointments, etc.  Non-urgent messages can be sent to your provider as well.   ?To learn more about what you can do with MyChart, go to [email or web address].   ? ?Your next appointment:   ?12 month(s) ? ?The format for your next appointment:   ?In Person ? ?Provider:   ?Lauree Chandler, MD   ? ? ?Important Information About Sugar ? ? ? ? ?   ?

## 2021-07-01 ENCOUNTER — Telehealth: Payer: Self-pay | Admitting: Internal Medicine

## 2021-07-01 NOTE — Telephone Encounter (Signed)
ATC patient on mobile number and unable to get through as it rang once and then asks for a mailbox number, will try again later

## 2021-07-02 NOTE — Telephone Encounter (Signed)
ATC mobile #, no answer, rang once and then asks for a mail box.  ATC home #, LVM to return call.

## 2021-07-08 ENCOUNTER — Telehealth: Payer: Self-pay | Admitting: Internal Medicine

## 2021-07-08 NOTE — Telephone Encounter (Signed)
LMTCB

## 2021-07-10 ENCOUNTER — Inpatient Hospital Stay: Payer: PPO | Attending: Oncology | Admitting: Oncology

## 2021-07-10 VITALS — BP 116/70 | HR 60 | Temp 98.1°F | Resp 18 | Ht 71.0 in | Wt 205.8 lb

## 2021-07-10 DIAGNOSIS — J449 Chronic obstructive pulmonary disease, unspecified: Secondary | ICD-10-CM | POA: Diagnosis not present

## 2021-07-10 DIAGNOSIS — Z8589 Personal history of malignant neoplasm of other organs and systems: Secondary | ICD-10-CM | POA: Insufficient documentation

## 2021-07-10 DIAGNOSIS — C8290 Follicular lymphoma, unspecified, unspecified site: Secondary | ICD-10-CM | POA: Diagnosis not present

## 2021-07-10 DIAGNOSIS — Z8572 Personal history of non-Hodgkin lymphomas: Secondary | ICD-10-CM | POA: Insufficient documentation

## 2021-07-10 DIAGNOSIS — I2510 Atherosclerotic heart disease of native coronary artery without angina pectoris: Secondary | ICD-10-CM | POA: Diagnosis not present

## 2021-07-10 NOTE — Progress Notes (Signed)
  [facility] OFFICE PROGRESS NOTE   Diagnosis: Non-Hodgkin lymphoma  INTERVAL HISTORY:   Joseph Soto returns as scheduled.  He reports recent exertional dyspnea.  He has been evaluated by pulmonary medicine.  He was treated for a COPD flare.  He reports his symptoms are partially improved with a new inhaler.  The dyspnea was improved when he was vacationing in [place].  No fever.  Good appetite.  The left upper neck lymph node is smaller.  He had an upper respiratory infection a few months ago.    Objective:  Vital signs in last 24 hours:  Blood pressure 116/70, pulse 60, temperature 98.1 F (36.7 C), temperature source Oral, resp. rate 18, height '5\' 11"'$  (1.803 m), weight 205 lb 12.8 oz (93.4 kg), SpO2 100 %.    HEENT: Oropharynx without visible mass, slight firm fullness superior inferior to the angle of the left mandible and posterior to the carotid, no discrete mass Lymphatics: No cervical, supraclavicular, axillary, or inguinal nodes Resp: Lungs clear bilaterally Cardio: Regular rate and rhythm GI: No hepatosplenomegaly Vascular: No leg edema  Skin: No evidence of recurrent tumor at the anterior chest scar  Portacath/PICC-without erythema  Lab Results:  Lab Results  Component Value Date   WBC 9.6 06/07/2019   HGB 12.6 (L) 06/07/2019   HCT 38.3 06/07/2019   MCV 89 06/07/2019   PLT 298 06/07/2019   NEUTROABS 8.4 (H) 12/20/2015    CMP  Lab Results  Component Value Date   NA 137 06/07/2019   K 4.7 06/07/2019   CL 106 06/07/2019   CO2 26 06/07/2019   GLUCOSE 100 (H) 06/07/2019   BUN 24 06/07/2019   CREATININE 1.25 06/07/2019   CALCIUM 9.0 06/07/2019   PROT 6.7 01/26/2019   ALBUMIN 3.9 01/26/2019   AST 40 (H) 01/26/2019   ALT 32 01/26/2019   ALKPHOS 60 01/26/2019   BILITOT 0.5 01/26/2019   GFRNONAA 56 (L) 06/07/2019   GFRAA 65 06/07/2019     Medications: I have reviewed the patient's current medications.   Assessment/Plan: Joseph Soto  of the anterior chest wall, status post an excisional biopsy on 11/11/2015 confirming a Joseph Soto Positive lateral surgical margin, 2 x 0.6 cm, up to 5 mitoses per 10 high-powered fields Reexcision 12/23/2015-no Joseph Soto, negative resection margins    2.   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   History of coronary artery disease/myocardial infarction-status post coronary artery bypass surgery in 2007, status post placement of a RCA stent May 2018    4.   Hyperlipidemia   5.  Left high anterior cervical node first noted December 2019    Disposition: Joseph Minjares was diagnosed with follicular lymphoma in 1700.  He has been followed with observation.  There is no evidence of disease progression.  He will continue follow-up with pulmonary medicine for evaluation of exertional dyspnea.  He will return for an office visit in 8 months.  Betsy Coder, MD  07/10/2021  1:29 PM

## 2021-07-16 ENCOUNTER — Ambulatory Visit (HOSPITAL_COMMUNITY): Payer: PPO | Attending: Cardiovascular Disease

## 2021-07-16 DIAGNOSIS — I255 Ischemic cardiomyopathy: Secondary | ICD-10-CM | POA: Insufficient documentation

## 2021-07-16 LAB — ECHOCARDIOGRAM COMPLETE
Area-P 1/2: 2.13 cm2
S' Lateral: 3 cm

## 2021-08-07 NOTE — Telephone Encounter (Signed)
Since calling the office, pt has had a visit with oncology and the dyspnea was discussed during that visit. Nothing further needed.

## 2021-08-27 ENCOUNTER — Ambulatory Visit (INDEPENDENT_AMBULATORY_CARE_PROVIDER_SITE_OTHER): Payer: PPO | Admitting: Internal Medicine

## 2021-08-27 DIAGNOSIS — J449 Chronic obstructive pulmonary disease, unspecified: Secondary | ICD-10-CM | POA: Diagnosis not present

## 2021-08-27 LAB — PULMONARY FUNCTION TEST
DL/VA % pred: 43 %
DL/VA: 1.72 ml/min/mmHg/L
DLCO cor % pred: 46 %
DLCO cor: 11.56 ml/min/mmHg
DLCO unc % pred: 46 %
DLCO unc: 11.56 ml/min/mmHg
FEF 25-75 Post: 1.04 L/sec
FEF 25-75 Pre: 0.91 L/sec
FEF2575-%Change-Post: 13 %
FEF2575-%Pred-Post: 48 %
FEF2575-%Pred-Pre: 43 %
FEV1-%Change-Post: 0 %
FEV1-%Pred-Post: 75 %
FEV1-%Pred-Pre: 75 %
FEV1-Post: 2.25 L
FEV1-Pre: 2.25 L
FEV1FVC-%Change-Post: -2 %
FEV1FVC-%Pred-Pre: 78 %
FEV6-%Change-Post: 2 %
FEV6-%Pred-Post: 101 %
FEV6-%Pred-Pre: 99 %
FEV6-Post: 3.96 L
FEV6-Pre: 3.87 L
FEV6FVC-%Change-Post: 0 %
FEV6FVC-%Pred-Post: 104 %
FEV6FVC-%Pred-Pre: 104 %
FVC-%Change-Post: 2 %
FVC-%Pred-Post: 97 %
FVC-%Pred-Pre: 95 %
FVC-Post: 4.06 L
FVC-Pre: 3.98 L
Post FEV1/FVC ratio: 56 %
Post FEV6/FVC ratio: 98 %
Pre FEV1/FVC ratio: 57 %
Pre FEV6/FVC Ratio: 97 %
RV % pred: 158 %
RV: 4.11 L
TLC % pred: 123 %
TLC: 8.74 L

## 2021-08-27 NOTE — Progress Notes (Signed)
Full PFT Performed Today  

## 2021-08-27 NOTE — Patient Instructions (Signed)
Full PFT Performed Today  

## 2021-08-29 ENCOUNTER — Encounter: Payer: Self-pay | Admitting: Internal Medicine

## 2021-08-29 ENCOUNTER — Ambulatory Visit (INDEPENDENT_AMBULATORY_CARE_PROVIDER_SITE_OTHER): Payer: PPO | Admitting: Internal Medicine

## 2021-08-29 VITALS — BP 126/70 | HR 83 | Temp 97.5°F | Ht 70.0 in | Wt 204.6 lb

## 2021-08-29 DIAGNOSIS — J449 Chronic obstructive pulmonary disease, unspecified: Secondary | ICD-10-CM | POA: Diagnosis not present

## 2021-08-29 NOTE — Telephone Encounter (Signed)
Will forward to Dr. Shearon Stalls. Pt had OV today 08/29/21. Pt notified of Dr. Shearon Stalls being unavailable until late July/early August.

## 2021-08-29 NOTE — Patient Instructions (Signed)
Please schedule follow up scheduled with APP in 6 months.  If my schedule is not open yet, we will contact you with a reminder closer to that time. Please call [phone] if you haven't heard from [place] a month before.   Continue breztri. Let me know if allergy symptoms worsen.  You can send me a mychart message with photos about the device you were referring to.   Take the albuterol rescue inhaler every 4 to 6 hours as needed for wheezing or shortness of breath. You can also take it 15 minutes before exercise or exertional activity. Side effects include heart racing or pounding, jitters or anxiety. If you have a history of an irregular heart rhythm, it can make this worse. Can also give some patients a hard time sleeping.

## 2021-08-29 NOTE — Progress Notes (Signed)
Joseph Soto    [id number]    10/20/1945  Primary Care Physician:Paterson, Ermalene Searing, MD Date of Appointment: 08/29/2021 Established Patient Visit  Chief complaint:   Chief Complaint  Patient presents with   Follow-up    Cough improved      HPI: Joseph Soto is a 76 y.o. man with COPD.   Interval Updates: Here for follow up after 3 months. Received prednisone x 1 over the phone for exacerbation back in may. Otherwise doing better on breztri than trelegy.   Had PFTs done which show mild airflow limitation with air trapping and reduced dlco  Allergy symptoms controlled.   Staying active around the house and has a treadmill at home he uses.   I have reviewed the patient's family social and past medical history and updated as appropriate.   Past Medical History:  Diagnosis Date   Allergic rhinitis    grass, dust   Buttock pain    CAD (coronary artery disease)    Last cath 2010 per Dr. Doreatha Lew with diffuse multivessel disease, small caliber vessels not felt to be suitable for PCI   Chronic left hip pain    Colon polyps    pt says a colonoscopy did not confirm this, said there was nothing there   Diverticulosis of sigmoid colon 2010   colonoscopy - [facility]   Dizziness    1 episode    Fibromyalgia    GERD (gastroesophageal reflux disease)    Head pain    "not chronic; I have myofasial tightening in my head that causes pain in my skull" (06/17/2016)   HLD (hyperlipidemia)    Hypertension    Leiomyosarcoma of chest wall ([place]) 11/2015   surgical excision alone recommended (per notes from Dr. Shon Baton office)   Myeloma [facility])    "found it in my chest when they did the excision"   Myocardial infarction ([place]) 06/2005   Non Hodgkin's lymphoma ([place]) dx'd 06/2005    Past Surgical History:  Procedure Laterality Date   BALLOON SINUPLASTY  2014   CARDIAC CATHETERIZATION  ~ 2008   "Dr. Doreatha Lew"   CATARACT EXTRACTION W/ INTRAOCULAR LENS IMPLANT Right     colonscopy  2010   CORONARY ANGIOPLASTY WITH STENT PLACEMENT  06/17/2016   CORONARY ARTERY BYPASS GRAFT  5/07   x5. LV dysfunction.    CORONARY BALLOON ANGIOPLASTY N/A 06/14/2019   Procedure: CORONARY BALLOON ANGIOPLASTY;  Surgeon: Burnell Blanks, MD;  Location: [place];  Service: Cardiovascular;  Laterality: N/A;   CORONARY STENT INTERVENTION N/A 06/17/2016   Procedure: Coronary Stent Intervention;  Surgeon: Burnell Blanks, MD;  Location: [facility] [facility];  Service: Cardiovascular;  Laterality: N/A;   epidural steroid injections     LAPAROSCOPIC CHOLECYSTECTOMY  1996   LEFT HEART CATH AND CORS/GRAFTS ANGIOGRAPHY N/A 06/17/2016   Procedure: Left Heart Cath and Cors/Grafts Angiography;  Surgeon: Burnell Blanks, MD;  Location: [place] [facility];  Service: Cardiovascular;  Laterality: N/A;   LEFT HEART CATH AND CORS/GRAFTS ANGIOGRAPHY N/A 06/14/2019   Procedure: LEFT HEART CATH AND CORS/GRAFTS ANGIOGRAPHY;  Surgeon: Burnell Blanks, MD;  Location: [place] [facility];  Service: Cardiovascular;  Laterality: N/A;   MASS EXCISION N/A 11/11/2015   Procedure: EXCISION OF MID CHEST  MASS;  Surgeon: Judeth Horn, MD;  Location: [facility];  Service: General;  Laterality: N/A;   MASS EXCISION N/A 12/23/2015   Procedure: REEXCISION  OF CHEST WALL TUMOR SITE;  Surgeon: Judeth Horn, MD;  Location: [facility];  Service: General;  Laterality: N/A;   POPLITEAL SYNOVIAL CYST EXCISION Right    TONSILLECTOMY  1952   & Adenoidectomy    Family History  Problem Relation Age of Onset   Heart disease Father    Heart attack Father    Congestive Heart Failure Father    COPD Father    Dementia Mother    Cancer Paternal Grandfather        ? type    Social History   Occupational History   Occupation: retired  Tobacco Use   Smoking status: Former    Packs/day: 2.00    Years: 30.00    Total pack years: 60.00    Types: Cigarettes    Quit  date: 05/16/2000    Years since quitting: 21.3   Smokeless tobacco: Never   Tobacco comments:    "quit 06/2005"  Vaping Use   Vaping Use: Never used  Substance and Sexual Activity   Alcohol use: No    Alcohol/week: 0.0 standard drinks of alcohol   Drug use: No   Sexual activity: Not on file     Physical Exam: Blood pressure 126/70, pulse 83, temperature (!) 97.5 F (36.4 C), temperature source Oral, height '5\' 10"'$  (1.778 m), weight 204 lb 9.6 oz (92.8 kg), SpO2 95 %.  Gen:      No acute distress Lungs:   diminished, no wheezes or crackles CV:         Regular rate and rhythm; no murmurs, rubs, or gallops.  No pedal edema   Data Reviewed: Imaging: I have personally reviewed the chest xray March 2023 - hyperinflation and interstitial changes consistent with COPD.   PFTs:     Latest Ref Rng & Units 08/27/2021    3:41 PM 01/26/2014    3:47 PM  PFT Results  FVC-Pre L 3.98  6.05   FVC-Predicted Pre % 95  136   FVC-Post L 4.06  5.54   FVC-Predicted Post % 97  125   Pre FEV1/FVC % % 57  47   Post FEV1/FCV % % 56  49   FEV1-Pre L 2.25  2.85   FEV1-Predicted Pre % 75  87   FEV1-Post L 2.25  2.73   DLCO uncorrected ml/min/mmHg 11.56  21.64   DLCO UNC% % 46  66   DLCO corrected ml/min/mmHg 11.56  21.64   DLCO COR %Predicted % 46  66   DLVA Predicted % 43  61   TLC L 8.74  10.32   TLC % Predicted % 123  146   RV % Predicted % 158  171    I have personally reviewed the patient's PFTs and mild airflow limitation with hyperinflation and air trapping.   Labs: Lab Results  Component Value Date   WBC 9.6 06/07/2019   HGB 12.6 (L) 06/07/2019   HCT 38.3 06/07/2019   MCV 89 06/07/2019   PLT 298 06/07/2019     Immunization status: Immunization History  Administered Date(s) Administered   Influenza Split 11/27/2019   Influenza, High Dose Seasonal PF 11/08/2016   Influenza-Unspecified 10/16/2010, 11/11/2015, 11/09/2017   PFIZER(Purple Top)SARS-COV-2 Vaccination 03/19/2019,  04/13/2019, 10/11/2019   Pfizer Covid-19 Vaccine Bivalent Booster 61yr & up 10/30/2020   Pneumococcal Conjugate-13 02/09/2014   Pneumococcal Polysaccharide-23 02/10/2012   Zoster Recombinat (Shingrix) 11/20/2020    External Records Personally Reviewed: PCP  Assessment:  COPD mild FEV1 77%. Lung  function has declined when compared to 2015.  Allergic rhinitis, controlled   Plan/Recommendations: Continue trelegy continue prn albuterol Monitor symptoms of allergies - can consider allergy testing and treatment for allergic rhinitis.  Outside the window for lung cancer screening.   Return to Care: Return in about 6 months (around 03/01/2022).   Lenice Llamas, MD Pulmonary and [place]

## 2021-09-01 ENCOUNTER — Ambulatory Visit: Payer: PPO | Admitting: Internal Medicine

## 2021-09-01 ENCOUNTER — Other Ambulatory Visit: Payer: Self-pay | Admitting: Cardiovascular Disease

## 2021-09-02 ENCOUNTER — Encounter: Payer: Self-pay | Admitting: Cardiovascular Disease

## 2021-09-25 DIAGNOSIS — H6123 Impacted cerumen, bilateral: Secondary | ICD-10-CM | POA: Diagnosis not present

## 2021-10-06 DIAGNOSIS — Z0000 Encounter for general adult medical examination without abnormal findings: Secondary | ICD-10-CM | POA: Diagnosis not present

## 2021-10-06 DIAGNOSIS — E785 Hyperlipidemia, unspecified: Secondary | ICD-10-CM | POA: Diagnosis not present

## 2021-10-06 DIAGNOSIS — I10 Essential (primary) hypertension: Secondary | ICD-10-CM | POA: Diagnosis not present

## 2021-10-06 DIAGNOSIS — Z125 Encounter for screening for malignant neoplasm of prostate: Secondary | ICD-10-CM | POA: Diagnosis not present

## 2021-10-06 DIAGNOSIS — R5383 Other fatigue: Secondary | ICD-10-CM | POA: Diagnosis not present

## 2021-10-06 DIAGNOSIS — R7989 Other specified abnormal findings of blood chemistry: Secondary | ICD-10-CM | POA: Diagnosis not present

## 2021-10-10 ENCOUNTER — Other Ambulatory Visit: Payer: Self-pay | Admitting: Cardiovascular Disease

## 2021-10-14 DIAGNOSIS — J449 Chronic obstructive pulmonary disease, unspecified: Secondary | ICD-10-CM | POA: Diagnosis not present

## 2021-10-14 DIAGNOSIS — I2510 Atherosclerotic heart disease of native coronary artery without angina pectoris: Secondary | ICD-10-CM | POA: Diagnosis not present

## 2021-10-14 DIAGNOSIS — Z8572 Personal history of non-Hodgkin lymphomas: Secondary | ICD-10-CM | POA: Diagnosis not present

## 2021-10-14 DIAGNOSIS — I129 Hypertensive chronic kidney disease with stage 1 through stage 4 chronic kidney disease, or unspecified chronic kidney disease: Secondary | ICD-10-CM | POA: Diagnosis not present

## 2021-10-14 DIAGNOSIS — F5104 Psychophysiologic insomnia: Secondary | ICD-10-CM | POA: Diagnosis not present

## 2021-10-14 DIAGNOSIS — R0600 Dyspnea, unspecified: Secondary | ICD-10-CM | POA: Diagnosis not present

## 2021-10-14 DIAGNOSIS — E785 Hyperlipidemia, unspecified: Secondary | ICD-10-CM | POA: Diagnosis not present

## 2021-10-14 DIAGNOSIS — Z0000 Encounter for general adult medical examination without abnormal findings: Secondary | ICD-10-CM | POA: Diagnosis not present

## 2021-10-14 DIAGNOSIS — R82998 Other abnormal findings in urine: Secondary | ICD-10-CM | POA: Diagnosis not present

## 2021-10-24 DIAGNOSIS — R051 Acute cough: Secondary | ICD-10-CM | POA: Diagnosis not present

## 2021-10-24 DIAGNOSIS — J029 Acute pharyngitis, unspecified: Secondary | ICD-10-CM | POA: Diagnosis not present

## 2021-10-24 DIAGNOSIS — Z1152 Encounter for screening for COVID-19: Secondary | ICD-10-CM | POA: Diagnosis not present

## 2021-10-24 DIAGNOSIS — J449 Chronic obstructive pulmonary disease, unspecified: Secondary | ICD-10-CM | POA: Diagnosis not present

## 2021-10-24 DIAGNOSIS — J069 Acute upper respiratory infection, unspecified: Secondary | ICD-10-CM | POA: Diagnosis not present

## 2021-10-24 DIAGNOSIS — I129 Hypertensive chronic kidney disease with stage 1 through stage 4 chronic kidney disease, or unspecified chronic kidney disease: Secondary | ICD-10-CM | POA: Diagnosis not present

## 2021-10-28 ENCOUNTER — Encounter: Payer: Self-pay | Admitting: Internal Medicine

## 2021-10-31 NOTE — Telephone Encounter (Signed)
Dr. Shearon Stalls, please see pt's message about his 76 year old sleep study. Please advise if you would like to set up a video/telephone visit or wait until the January follow visit with you. Thanks.

## 2021-11-03 ENCOUNTER — Telehealth: Payer: Self-pay | Admitting: *Deleted

## 2021-11-03 NOTE — Telephone Encounter (Signed)
Pt agreeable to plan of care for tele pre op appt 11/07/21 @ 9:20. Pt advised to not take Plavix on 11/06/21 in order to hold for x 5 days for upcoming procedure. Med rec and consent are done.     Patient Consent for Virtual Visit        Joseph Soto has provided verbal consent on 11/03/2021 for a virtual visit (video or telephone).   CONSENT FOR VIRTUAL VISIT FOR:  Joseph Soto  By participating in this virtual visit I agree to the following:  I hereby voluntarily request, consent and authorize [facility] [facility] and its employed or contracted physicians, physician assistants, nurse practitioners or other licensed health care professionals (the Practitioner), to provide me with telemedicine health care services (the "Services") as deemed necessary by the treating Practitioner. I acknowledge and consent to receive the Services by the Practitioner via telemedicine. I understand that the telemedicine visit will involve communicating with the Practitioner through live audiovisual communication technology and the disclosure of certain medical information by electronic transmission. I acknowledge that I have been given the opportunity to request an in-person assessment or other available alternative prior to the telemedicine visit and am voluntarily participating in the telemedicine visit.  I understand that I have the right to withhold or withdraw my consent to the use of telemedicine in the course of my care at any time, without affecting my right to future care or treatment, and that the Practitioner or I may terminate the telemedicine visit at any time. I understand that I have the right to inspect all information obtained and/or recorded in the course of the telemedicine visit and may receive copies of available information for a reasonable fee.  I understand that some of the potential risks of receiving the Services via telemedicine include:  Delay or interruption in medical evaluation due  to technological equipment failure or disruption; Information transmitted may not be sufficient (e.g. poor resolution of images) to allow for appropriate medical decision making by the Practitioner; and/or  In rare instances, security protocols could fail, causing a breach of personal health information.  Furthermore, I acknowledge that it is my responsibility to provide information about my medical history, conditions and care that is complete and accurate to the best of my ability. I acknowledge that Practitioner's advice, recommendations, and/or decision may be based on factors not within their control, such as incomplete or inaccurate data provided by me or distortions of diagnostic images or specimens that may result from electronic transmissions. I understand that the practice of medicine is not an exact science and that Practitioner makes no warranties or guarantees regarding treatment outcomes. I acknowledge that a copy of this consent can be made available to me via my patient portal ([place]), or I can request a printed copy by calling the office of [facility].    I understand that my insurance will be billed for this visit.   I have read or had this consent read to me. I understand the contents of this consent, which adequately explains the benefits and risks of the Services being provided via telemedicine.  I have been provided ample opportunity to ask questions regarding this consent and the Services and have had my questions answered to my satisfaction. I give my informed consent for the services to be provided through the use of telemedicine in my medical care

## 2021-11-03 NOTE — Telephone Encounter (Signed)
Pt agreeable to plan of care for tele pre op appt 11/07/21 @ 9:20. Pt advised to not take Plavix on 11/06/21 in order to hold for x 5 days for upcoming procedure. Med rec and consent are done.

## 2021-11-03 NOTE — Telephone Encounter (Signed)
Sounds like he wants to hold off on pulmonary rehab? Can revisit at next office visit. 6 years unfortunately too old for a sleep study now. Recommend home sleep apnea test order and follow up with APP after to review results. Since I will likely be on leave when this happens.

## 2021-11-03 NOTE — Telephone Encounter (Signed)
   Pre-operative Risk Assessment    Patient Name: Joseph Soto  DOB: 1945-06-15 MRN: [id number]      Request for Surgical Clearance    Procedure:   COLONOSCOPY  Date of Surgery:  Clearance 11/12/21                                 Surgeon:  DR. Darlin Priestly Surgeon's Group or Practice Name:  [facility] Phone number:  [phone] Fax number:  [phone]   Type of Clearance Requested:   - Medical  - Pharmacy:  Hold Clopidogrel (Plavix)     Type of Anesthesia:   PROPOFOL   Additional requests/questions:    [name]   11/03/2021, 4:48 PM

## 2021-11-03 NOTE — Telephone Encounter (Signed)
   Name: Joseph Soto  DOB: 09-17-1945  MRN: [id number]  Primary Cardiologist: Lauree Chandler, MD   Preoperative team, please contact this patient and set up a phone call appointment for further preoperative risk assessment. Please obtain consent and complete medication review. Thank you for your help.  I confirm that guidance regarding antiplatelet and oral anticoagulation therapy has been completed and, if necessary, noted below.  Per office protocol, if patient is without any new symptoms or concerns at the time of their virtual visit, he/she may hold Plavix for 5 days prior to procedure. We recommend continuing Aspirin throughout the perioperative period. Please resume Plavix as soon as possible postprocedure, at the discretion of the surgeon.    Lenna Sciara, NP 11/03/2021, 5:16 PM [place]

## 2021-11-04 ENCOUNTER — Other Ambulatory Visit: Payer: Self-pay

## 2021-11-04 DIAGNOSIS — R0683 Snoring: Secondary | ICD-10-CM

## 2021-11-06 ENCOUNTER — Encounter: Payer: Self-pay | Admitting: Cardiovascular Disease

## 2021-11-07 ENCOUNTER — Ambulatory Visit: Payer: PPO | Attending: Cardiovascular Disease | Admitting: Nurse Practitioner

## 2021-11-07 DIAGNOSIS — Z01810 Encounter for preprocedural cardiovascular examination: Secondary | ICD-10-CM | POA: Diagnosis not present

## 2021-11-07 NOTE — Progress Notes (Signed)
Virtual Visit via Telephone Note   Because of Joseph Soto's co-morbid illnesses, he is at least at moderate risk for complications without adequate follow up.  This format is felt to be most appropriate for this patient at this time.  The patient did not have access to video technology/had technical difficulties with video requiring transitioning to audio format only (telephone).  All issues noted in this document were discussed and addressed.  No physical exam could be performed with this format.  Please refer to the patient's chart for his consent to telehealth for [facility].  Evaluation Performed:  Preoperative cardiovascular risk assessment _____________   Date:  11/07/2021   Patient ID:  Joseph Soto, DOB Aug 28, 1945, MRN [id number] Patient Location:  Home Provider location:   Office  Primary Care Provider:  Donnajean Lopes, MD Primary Cardiologist:  Lauree Chandler, MD  Chief Complaint / Patient Profile   76 y.o. y/o male with a h/o CAD s/p CABG in 2007, ICM,, hyperlipidemia, non-Hodgkin's lymphoma, fibromyalgia, and GERD who is pending colonoscopy on 11/12/2021 with Dr. Darlin Priestly of [facility] and presents today for telephonic preoperative cardiovascular risk assessment.  Past Medical History    Past Medical History:  Diagnosis Date   Allergic rhinitis    grass, dust   Buttock pain    CAD (coronary artery disease)    Last cath 2010 per Dr. 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 Shon Baton office)   Myeloma [facility])    "found it in my chest when they did the excision"   Myocardial infarction ([place]) 06/2005   Non Hodgkin's lymphoma ([place]) dx'd 06/2005   Past Surgical History:  Procedure Laterality Date   BALLOON SINUPLASTY  2014   CARDIAC CATHETERIZATION  ~ 2008   "Dr. Doreatha Lew"   CATARACT EXTRACTION W/ INTRAOCULAR LENS IMPLANT Right    colonscopy  2010   CORONARY ANGIOPLASTY WITH STENT PLACEMENT  06/17/2016   CORONARY ARTERY BYPASS GRAFT  5/07   x5. LV dysfunction.    CORONARY BALLOON ANGIOPLASTY N/A 06/14/2019   Procedure: CORONARY BALLOON ANGIOPLASTY;  Surgeon: Burnell Blanks, MD;  Location: [place] [facility];  Service: Cardiovascular;  Laterality: N/A;   CORONARY STENT INTERVENTION N/A 06/17/2016   Procedure: Coronary Stent Intervention;  Surgeon: Burnell Blanks, MD;  Location: [place] [facility];  Service: Cardiovascular;  Laterality: N/A;   epidural steroid injections     LAPAROSCOPIC CHOLECYSTECTOMY  1996   LEFT HEART CATH AND CORS/GRAFTS ANGIOGRAPHY N/A 06/17/2016   Procedure: Left Heart Cath and Cors/Grafts Angiography;  Surgeon: Burnell Blanks, MD;  Location: [place] [facility];  Service: Cardiovascular;  Laterality: N/A;   LEFT HEART CATH AND CORS/GRAFTS ANGIOGRAPHY N/A 06/14/2019   Procedure: LEFT HEART CATH AND CORS/GRAFTS ANGIOGRAPHY;  Surgeon: Burnell Blanks, MD;  Location: [place] [facility];  Service: Cardiovascular;  Laterality: N/A;   MASS EXCISION N/A 11/11/2015   Procedure: EXCISION OF MID CHEST  MASS;  Surgeon: Judeth Horn, MD;  Location: [place];  Service: General;  Laterality: N/A;   MASS EXCISION N/A 12/23/2015   Procedure: REEXCISION OF CHEST WALL TUMOR SITE;  Surgeon: Judeth Horn, MD;  Location: [place];  Service: General;  Laterality: N/A;   POPLITEAL SYNOVIAL CYST EXCISION  Right    TONSILLECTOMY  1952   & Adenoidectomy    Allergies  Allergies  Allergen Reactions   Niacin And Related Nausea And Vomiting   Niaspan [Niacin Er] Nausea And Vomiting and Other (See Comments)    Stomach intolerance    History of Present Illness    Joseph Soto is a 76 y.o. male who presents via audio/video conferencing for a telehealth visit today.  Pt was last seen in cardiology clinic on 06/25/2021 by Dr. Angelena Form. At that time Joseph Soto was doing well. The patient is now pending procedure as outlined above. Since his last visit, he has been stable overall from a cardiac standpoint. He has noted some dyspnea, particularly when bending down. He is following with pulmonology and is pending a sleep study.  His,symptoms are not similar to his prior anginal equivalent. He denies any significant exertional dyspnea, denies chest pain. He is able to complete > 4 METS without difficulty. Discussed possibility of stress test prior to colonoscopy, however, patient declines at this time and wishes to proceed without further testing. He denies palpitations, pnd, orthopnea, n, v, dizziness, syncope, edema, weight gain, or early satiety. All other systems reviewed and are otherwise negative except as noted above.   Home Medications    Prior to Admission medications   Medication Sig Start Date End Date Taking? Authorizing Provider  albuterol (VENTOLIN HFA) 108 (90 Base) MCG/ACT inhaler Inhale 2 puffs into the lungs every 6 (six) hours as needed. Patient not taking: Reported on 06/25/2021 12/08/19   [provider]  amLODipine (NORVASC) 5 MG tablet Take 5 mg by mouth daily. 11/10/19   [provider]  aspirin EC 81 MG tablet Take 81 mg by mouth daily.    [provider]  Budeson-Glycopyrrol-Formoterol (BREZTRI AEROSPHERE) 160-9-4.8 MCG/ACT AERO Inhale 2 puffs into the lungs in the morning and at bedtime. Patient not taking: Reported on 11/03/2021 06/02/21   Spero Geralds, MD  cefdinir (OMNICEF) 300 MG capsule Take 300 mg by mouth 2 (two) times daily. 05/30/21   [provider]  cholecalciferol (VITAMIN D) 1000 units tablet Take 1,000 Units by mouth in the morning and at bedtime.     [provider]  clopidogrel (PLAVIX) 75 MG tablet TAKE 1 TABLET BY MOUTH DAILY WITH BREAKFAST. 12/17/20   Burnell Blanks, MD  Coenzyme Q10 300 MG CAPS Take 300 mg by mouth in the morning and at bedtime.     [provider]  cyclobenzaprine (FLEXERIL) 10 MG tablet Take 10 mg by mouth 2 (two) times daily.    [provider]  DULoxetine (CYMBALTA) 60 MG capsule Take 1 capsule by mouth daily. 02/18/21   [provider]  ezetimibe (ZETIA) 10 MG tablet Take 1 tablet (10 mg total) by mouth daily. 10/10/21   Burnell Blanks, MD  fluticasone (FLONASE) 50 MCG/ACT nasal spray Place 1 spray into both nostrils daily as needed. 12/08/19   [provider]  Fluticasone-Umeclidin-Vilant (TRELEGY ELLIPTA) 100-62.5-25 MCG/ACT  AEPB Inhale into the lungs. DAILY PER PT    [provider]  isosorbide mononitrate (IMDUR) 30 MG 24 hr tablet Take 1 tablet (30 mg total) by mouth daily. Please schedule yearly appointment for future refills. 1st attempt. Thank you 08/05/20   Burnell Blanks, MD  metoprolol succinate (TOPROL-XL) 25 MG 24 hr tablet TAKE 1 TABLET (25 MG TOTAL) BY MOUTH DAILY. 09/02/21   Burnell Blanks, MD  naltrexone (DEPADE) 50 MG tablet Take 50 mg by mouth 2 (two) times daily.     [provider]  nitroGLYCERIN (NITROSTAT) 0.4 MG SL tablet Place 1 tablet (0.4 mg total) under the tongue every 5 (five) minutes as needed for chest pain. 06/14/19   Kroeger, Lorelee Cover., PA-C  predniSONE (DELTASONE) 10 MG tablet Take 10 mg by mouth as needed.    [provider]  rosuvastatin (CRESTOR) 40 MG tablet Take 1 tablet (40 mg total) by mouth daily. 09/09/20   Burnell Blanks, MD  triamcinolone  (NASACORT) 55 MCG/ACT AERO nasal inhaler Place into the nose. Place into the nose.    [provider]  TURMERIC CURCUMIN PO Take 1,500 mg by mouth in the morning and at bedtime.    [provider]  valACYclovir (VALTREX) 1000 MG tablet Take 2,000 mg by mouth daily. 04/21/21   [provider]    Physical Exam    Vital Signs:  Joseph Soto does not have vital signs available for review today.  Given telephonic nature of communication, physical exam is limited. AAOx3. NAD. Normal affect.  Speech and respirations are unlabored.  Accessory Clinical Findings    None  Assessment & Plan    1.  Preoperative Cardiovascular Risk Assessment:  According to the Revised Cardiac Risk Index (RCRI), his Perioperative Risk of Major Cardiac Event is (%): 0.9. His Functional Capacity in METs is: 7.01 according to the [place] Activity Status Index (DASI). Therefore, based on ACC/AHA guidelines, patient would be at acceptable risk for the planned procedure without further cardiovascular testing.  The patient was advised that if he develops new symptoms prior to surgery to contact our office to arrange for a follow-up visit, and he verbalized understanding.  Per office protocol, patient may hold Plavix for 5 days prior to procedure. We recommend continuing Aspirin throughout the perioperative period. Please resume Plavix as soon as possible postprocedure, at the discretion of the surgeon.   A copy of this note will be routed to requesting surgeon.  Time:   Today, I have spent 10 minutes with the patient with telehealth technology discussing medical history, symptoms, and management plan.     Lenna Sciara, NP  11/07/2021, 9:37 AM

## 2021-11-12 DIAGNOSIS — K635 Polyp of colon: Secondary | ICD-10-CM | POA: Diagnosis not present

## 2021-11-12 DIAGNOSIS — K6389 Other specified diseases of intestine: Secondary | ICD-10-CM | POA: Diagnosis not present

## 2021-11-12 DIAGNOSIS — K648 Other hemorrhoids: Secondary | ICD-10-CM | POA: Diagnosis not present

## 2021-11-12 DIAGNOSIS — Z86010 Personal history of colonic polyps: Secondary | ICD-10-CM | POA: Diagnosis not present

## 2021-11-12 DIAGNOSIS — K5730 Diverticulosis of large intestine without perforation or abscess without bleeding: Secondary | ICD-10-CM | POA: Diagnosis not present

## 2021-11-12 DIAGNOSIS — Z09 Encounter for follow-up examination after completed treatment for conditions other than malignant neoplasm: Secondary | ICD-10-CM | POA: Diagnosis not present

## 2021-11-12 DIAGNOSIS — K621 Rectal polyp: Secondary | ICD-10-CM | POA: Diagnosis not present

## 2021-11-13 ENCOUNTER — Other Ambulatory Visit: Payer: Self-pay | Admitting: Gastroenterology

## 2021-11-13 DIAGNOSIS — R933 Abnormal findings on diagnostic imaging of other parts of digestive tract: Secondary | ICD-10-CM

## 2021-11-19 DIAGNOSIS — K621 Rectal polyp: Secondary | ICD-10-CM | POA: Diagnosis not present

## 2021-11-29 ENCOUNTER — Other Ambulatory Visit: Payer: Self-pay | Admitting: Cardiovascular Disease

## 2021-12-12 ENCOUNTER — Ambulatory Visit
Admission: RE | Admit: 2021-12-12 | Discharge: 2021-12-12 | Disposition: A | Discharge status: Home / Self Care | Payer: PPO | Source: Ambulatory Visit | Attending: Gastroenterology | Admitting: Gastroenterology

## 2021-12-12 DIAGNOSIS — K4020 Bilateral inguinal hernia, without obstruction or gangrene, not specified as recurrent: Secondary | ICD-10-CM | POA: Diagnosis not present

## 2021-12-12 DIAGNOSIS — R933 Abnormal findings on diagnostic imaging of other parts of digestive tract: Secondary | ICD-10-CM

## 2021-12-12 MED ORDER — IOPAMIDOL (ISOVUE-300) INJECTION 61%
75.0000 mL | Freq: Once | INTRAVENOUS | Status: AC | PRN
  Administered 2021-12-12: 75 mL via INTRAVENOUS

## 2021-12-16 ENCOUNTER — Encounter: Payer: Self-pay | Admitting: Cardiovascular Disease

## 2021-12-16 NOTE — Telephone Encounter (Signed)
Per chart, CT abdomen/pelvis done 12/12/21:  FINDINGS: Lower chest: Centrilobular and paraseptal emphysema. Mild dependent atelectasis. Heart size normal. No pericardial or pleural effusion. Atherosclerotic calcification of the aorta, aortic valve and coronary arteries. Heart size normal. No pericardial or pleural effusion. Distal esophagus is unremarkable  Patient is on zetia '10mg'$  daily and rosuvastatin '40mg'$  daily for his cholesterol and last had a cath 06/14/19: Recommendations: Will continue medical management of CAD. His left main disease and proximal Circumflex disease is not amenable to percutaneous therapy. I do not think that his distal RCA branch disease is favorable for PCI either. If he continues to have dyspnea, may need to consider re-do bypass surgery   Will route to primary MD for review, but assured patient that this is not new information and if not symptomatic, likely will continue plan as is. Pt last seen 06/25/21 and advised to return in 1 year.

## 2021-12-17 DIAGNOSIS — L812 Freckles: Secondary | ICD-10-CM | POA: Diagnosis not present

## 2021-12-17 DIAGNOSIS — Z85828 Personal history of other malignant neoplasm of skin: Secondary | ICD-10-CM | POA: Diagnosis not present

## 2021-12-17 DIAGNOSIS — L821 Other seborrheic keratosis: Secondary | ICD-10-CM | POA: Diagnosis not present

## 2021-12-17 DIAGNOSIS — L570 Actinic keratosis: Secondary | ICD-10-CM | POA: Diagnosis not present

## 2021-12-17 DIAGNOSIS — D1801 Hemangioma of skin and subcutaneous tissue: Secondary | ICD-10-CM | POA: Diagnosis not present

## 2021-12-17 DIAGNOSIS — D224 Melanocytic nevi of scalp and neck: Secondary | ICD-10-CM | POA: Diagnosis not present

## 2021-12-23 ENCOUNTER — Telehealth: Payer: Self-pay | Admitting: Internal Medicine

## 2021-12-23 NOTE — Telephone Encounter (Signed)
Pt had not been scheduled but he has appt with ND on 11/20 and I went ahead and got him scheduled this week.  Spoke to pt.  Nothing further needed.

## 2021-12-24 ENCOUNTER — Ambulatory Visit: Payer: PPO

## 2021-12-24 DIAGNOSIS — G4733 Obstructive sleep apnea (adult) (pediatric): Secondary | ICD-10-CM | POA: Diagnosis not present

## 2021-12-24 DIAGNOSIS — R0683 Snoring: Secondary | ICD-10-CM

## 2021-12-25 ENCOUNTER — Other Ambulatory Visit: Payer: Self-pay | Admitting: Internal Medicine

## 2021-12-25 ENCOUNTER — Telehealth: Payer: Self-pay | Admitting: Pulmonary Disease

## 2021-12-25 DIAGNOSIS — G4733 Obstructive sleep apnea (adult) (pediatric): Secondary | ICD-10-CM | POA: Diagnosis not present

## 2021-12-25 DIAGNOSIS — I2510 Atherosclerotic heart disease of native coronary artery without angina pectoris: Secondary | ICD-10-CM | POA: Diagnosis not present

## 2021-12-25 DIAGNOSIS — Z8572 Personal history of non-Hodgkin lymphomas: Secondary | ICD-10-CM | POA: Diagnosis not present

## 2021-12-25 DIAGNOSIS — R42 Dizziness and giddiness: Secondary | ICD-10-CM | POA: Diagnosis not present

## 2021-12-25 DIAGNOSIS — I10 Essential (primary) hypertension: Secondary | ICD-10-CM | POA: Diagnosis not present

## 2021-12-25 DIAGNOSIS — J309 Allergic rhinitis, unspecified: Secondary | ICD-10-CM | POA: Diagnosis not present

## 2021-12-25 DIAGNOSIS — J449 Chronic obstructive pulmonary disease, unspecified: Secondary | ICD-10-CM | POA: Diagnosis not present

## 2021-12-25 NOTE — Telephone Encounter (Signed)
Call patient  Sleep study result  Date of study: 12/24/2021  Impression: Mild obstructive sleep apnea Severe oxygen desaturations likely related to underlying obstructive lung disease  Recommendation: Oxygen supplementation for underlying hypoxemia should be considered  Options of treatment for mild obstructive sleep apnea if there is significant symptoms should entail an in lab sleep study to ascertain optimal pressures to treat sleep disordered breathing and assess oxygen need despite CPAP-need to figure out how much oxygen is needed to be bled into the system  If CPAP needed needs to be followed closely  Schedule follow-up with Dr. Shearon Stalls

## 2021-12-26 NOTE — Telephone Encounter (Signed)
Dr Shearon Stalls, this pt is scheduled with you for 12/29/21 [facility]

## 2021-12-29 ENCOUNTER — Ambulatory Visit: Payer: PPO | Admitting: Internal Medicine

## 2021-12-29 ENCOUNTER — Encounter: Payer: Self-pay | Admitting: Internal Medicine

## 2021-12-29 VITALS — BP 110/70 | HR 75 | Temp 97.5°F | Ht 71.0 in | Wt 207.2 lb

## 2021-12-29 DIAGNOSIS — J4489 Other specified chronic obstructive pulmonary disease: Secondary | ICD-10-CM | POA: Diagnosis not present

## 2021-12-29 DIAGNOSIS — K5790 Diverticulosis of intestine, part unspecified, without perforation or abscess without bleeding: Secondary | ICD-10-CM | POA: Diagnosis not present

## 2021-12-29 DIAGNOSIS — J439 Emphysema, unspecified: Secondary | ICD-10-CM | POA: Diagnosis not present

## 2021-12-29 DIAGNOSIS — Z86010 Personal history of colonic polyps: Secondary | ICD-10-CM | POA: Diagnosis not present

## 2021-12-29 DIAGNOSIS — G4734 Idiopathic sleep related nonobstructive alveolar hypoventilation: Secondary | ICD-10-CM | POA: Diagnosis not present

## 2021-12-29 DIAGNOSIS — G4733 Obstructive sleep apnea (adult) (pediatric): Secondary | ICD-10-CM | POA: Diagnosis not present

## 2021-12-29 NOTE — Addendum Note (Signed)
Addended by: Loma Sousa on: 12/29/2021 02:44 PM   Modules accepted: Orders

## 2021-12-29 NOTE — Patient Instructions (Addendum)
Please schedule follow up scheduled with APP in 2 months.  If my schedule is not open yet, we will contact you with a reminder closer to that time. Please call [phone] if you haven't heard from [place] a month before.   We will start you on night time oxygen 2L. They will call you to set this up at your home. Let's see if this helps your symptoms during the day of fatigue and shortness of breath.  Overall I think your symptoms are disproportionate to the severity of your lung disease which is rather mild. If nightime oxygen is not enough we can try a split night study in our sleep lab to see if CPAP therapy would be helpful. If treating your COPD with breztri and treating your mild sleep apnea with CPAP/oxygen does not help, I think we have to consider that this could be your heart. I will reach out to your cardiology team as well.

## 2021-12-29 NOTE — Progress Notes (Signed)
Joseph Soto    [id number]    [birth year]  Primary Care Physician:Paterson, Ermalene Searing, MD Date of Appointment: 12/29/2021 Established Patient Visit  Chief complaint:   Chief Complaint  Patient presents with   Follow-up    Ss results, sob during exertion      HPI: Joseph Soto is a 76 y.o. man with COPD FEV1 77% on breztri and CAD s/p CABG  Interval Updates: Here for follow up after home sleep apnea test which showed AHI 8.1/hr with severe oxygen desaturations likely attributed to chronic lung disease.    Received prednisone x 1 over the phone for exacerbation back in may.   Had PFTs done which show mild airflow limitation with air trapping and reduced dlco  Allergy symptoms controlled.   Staying active around the house and has a treadmill at home he uses.   I have reviewed the patient's family social and past medical history and updated as appropriate.   Past Medical History:  Diagnosis Date   Allergic rhinitis    grass, dust   Buttock pain    CAD (coronary artery disease)    Last cath 2010 per Dr. 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 Doreatha Lew"   CATARACT EXTRACTION W/ INTRAOCULAR LENS IMPLANT Right    colonscopy  2010   CORONARY ANGIOPLASTY WITH STENT PLACEMENT  06/17/2016   CORONARY ARTERY BYPASS GRAFT  5/07   x5. LV dysfunction.    CORONARY BALLOON ANGIOPLASTY N/A 06/14/2019   Procedure: CORONARY BALLOON ANGIOPLASTY;  Surgeon: Burnell Blanks, MD;  Location: [place] [facility];  Service: Cardiovascular;  Laterality: N/A;   CORONARY STENT INTERVENTION N/A 06/17/2016   Procedure: Coronary Stent Intervention;  Surgeon: Burnell Blanks, MD;  Location: [place] [facility];  Service: Cardiovascular;  Laterality: N/A;   epidural steroid injections     LAPAROSCOPIC CHOLECYSTECTOMY  1996   LEFT HEART CATH AND CORS/GRAFTS ANGIOGRAPHY N/A 06/17/2016   Procedure: Left Heart Cath and Cors/Grafts Angiography;  Surgeon: Burnell Blanks, MD;  Location: [place] [facility];  Service: Cardiovascular;  Laterality: N/A;   LEFT HEART CATH AND CORS/GRAFTS ANGIOGRAPHY N/A 06/14/2019   Procedure: LEFT HEART CATH AND CORS/GRAFTS ANGIOGRAPHY;  Surgeon: Burnell Blanks, MD;  Location: [place] [facility];  Service: Cardiovascular;  Laterality: N/A;   MASS EXCISION N/A 11/11/2015   Procedure: EXCISION OF MID CHEST  MASS;  Surgeon: Judeth Horn, MD;  Location: [place];  Service: General;  Laterality: N/A;   MASS EXCISION N/A 12/23/2015   Procedure: REEXCISION OF CHEST WALL TUMOR SITE;  Surgeon: Judeth Horn, MD;  Location: [place];  Service: General;  Laterality: N/A;   POPLITEAL SYNOVIAL CYST EXCISION Right    TONSILLECTOMY  1952   & Adenoidectomy    Family History  Problem Relation Age of Onset   Heart disease Father    Heart attack Father    Congestive Heart Failure Father    COPD Father    Dementia Mother    Cancer Paternal Grandfather        ? type    Social History   Occupational History   Occupation: retired  Tobacco Use    Smoking status: Former    Packs/day: 2.00    Years: 30.00    Total pack years: 60.00    Types: Cigarettes    Quit date: 05/16/2000    Years since quitting: 21.6   Smokeless tobacco: Never   Tobacco comments:    "quit 06/2005"  Vaping Use   Vaping Use: Never used  Substance and Sexual Activity   Alcohol use: No    Alcohol/week: 0.0 standard drinks of alcohol   Drug use: No   Sexual activity: Not on file     Physical Exam: Blood pressure 110/70, pulse 75, temperature (!) 97.5 F (36.4 C), temperature source Oral, height '5\' 11"'$  (1.803 m), weight 207 lb 3.2 oz (94 kg), SpO2 95 %.  Gen:      NAD Lungs:   ctab no wheezes or crackles CV:        RRR, no edema   Data Reviewed: Imaging: I have personally reviewed the chest xray March 2023 - hyperinflation and interstitial changes consistent with COPD.   PFTs:     Latest Ref Rng & Units 08/27/2021    3:41 PM 01/26/2014    3:47 PM  PFT Results  FVC-Pre L 3.98  6.05   FVC-Predicted Pre % 95  136   FVC-Post L 4.06  5.54   FVC-Predicted Post % 97  125   Pre FEV1/FVC % % 57  47   Post FEV1/FCV % % 56  49   FEV1-Pre L 2.25  2.85   FEV1-Predicted Pre % 75  87   FEV1-Post L 2.25  2.73   DLCO uncorrected ml/min/mmHg 11.56  21.64   DLCO UNC% % 46  66   DLCO corrected ml/min/mmHg 11.56  21.64   DLCO COR %Predicted % 46  66   DLVA Predicted % 43  61   TLC L 8.74  10.32   TLC % Predicted % 123  146   RV % Predicted % 158  171    I have personally reviewed the patient's PFTs and mild airflow limitation with hyperinflation and air trapping.   Labs: Lab Results  Component Value Date   WBC 9.6 06/07/2019   HGB 12.6 (L) 06/07/2019   HCT 38.3 06/07/2019   MCV 89 06/07/2019   PLT 298 06/07/2019     Immunization status: Immunization History  Administered Date(s) Administered   Influenza Split 11/27/2019   Influenza, High Dose Seasonal PF 11/08/2016   Influenza-Unspecified 10/16/2010, 11/11/2015, 11/09/2017, 11/07/2021    PFIZER(Purple Top)SARS-COV-2 Vaccination 03/19/2019, 04/13/2019, 10/11/2019, 04/30/2020   Pfizer Covid-19 Vaccine Bivalent Booster 46yr & up 10/30/2020   Pneumococcal Conjugate-13 02/09/2014   Pneumococcal Polysaccharide-23 02/10/2012   Zoster Recombinat (Shingrix) 11/20/2020    External Records Personally  Reviewed: PCP  Assessment:  COPD mild FEV1 77%. Lung function has declined when compared to 2015.  Allergic rhinitis, controlled Mild OSA - AHI 8/hr with severe nocturnal desaturation CAD s/p CABG with stenosis not amenable to PCI - stable LVEF as of June 2023  Plan/Recommendations: We will start you on night time oxygen 2L. They will call you to set this up at your home. Let's see if this helps your symptoms during the day of fatigue and shortness of breath.  Overall I think your symptoms are disproportionate to the severity of your lung disease which is rather mild. If nightime oxygen is not enough we can try a split night study in our sleep lab to see if CPAP therapy would be helpful. If treating your COPD with breztri and treating your mild sleep apnea with CPAP/oxygen does not help, I think we have to consider that this could be your heart. I will reach out to your cardiology team as well.   In the mean time continue Breztri with albuterol as needed.   Return to Care: Return in about 2 months (around 02/28/2022).   Lenice Llamas, MD Pulmonary and [place]

## 2022-01-05 DIAGNOSIS — J449 Chronic obstructive pulmonary disease, unspecified: Secondary | ICD-10-CM | POA: Diagnosis not present

## 2022-01-08 ENCOUNTER — Encounter: Payer: Self-pay | Admitting: Cardiovascular Disease

## 2022-01-09 DIAGNOSIS — I10 Essential (primary) hypertension: Secondary | ICD-10-CM | POA: Diagnosis not present

## 2022-01-09 DIAGNOSIS — J309 Allergic rhinitis, unspecified: Secondary | ICD-10-CM | POA: Diagnosis not present

## 2022-01-09 DIAGNOSIS — Z1152 Encounter for screening for COVID-19: Secondary | ICD-10-CM | POA: Diagnosis not present

## 2022-01-09 DIAGNOSIS — J449 Chronic obstructive pulmonary disease, unspecified: Secondary | ICD-10-CM | POA: Diagnosis not present

## 2022-01-09 DIAGNOSIS — R059 Cough, unspecified: Secondary | ICD-10-CM | POA: Diagnosis not present

## 2022-01-09 DIAGNOSIS — R5383 Other fatigue: Secondary | ICD-10-CM | POA: Diagnosis not present

## 2022-01-09 DIAGNOSIS — R0600 Dyspnea, unspecified: Secondary | ICD-10-CM | POA: Diagnosis not present

## 2022-02-04 DIAGNOSIS — J449 Chronic obstructive pulmonary disease, unspecified: Secondary | ICD-10-CM | POA: Diagnosis not present

## 2022-02-09 DIAGNOSIS — J449 Chronic obstructive pulmonary disease, unspecified: Secondary | ICD-10-CM | POA: Diagnosis not present

## 2022-02-15 NOTE — Progress Notes (Unsigned)
No chief complaint on file.   History of Present Illness: 77 yo male with history of CAD s/p CABG 2007, Non-hodgkins Lymphoma, ischemic cardiomyopathy and HLD here today for cardiac follow up. He had a 4V CABG in 2007. Cath in 2010 showed occluded SVG to Diagonal and occluded SVG to PDA/PLA with patent LIMA graft to the  LAD. There was diffuse disease in distal vessels felt best to be managed medically. 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 Echo April 2018 with LVEF=40-45% and hypokinesis of the anteroseptal wall and apex. Nuclear stress test April 2018 with possible ischemia. Cardiac cath 06/17/16 with patent LIMA to LAD, all other grafts known to be occluded. Severe stenosis in the proximal RCA treated with a drug eluting stent. He was seen in our office August 2019 by Robbie Lis, PA-C with c/o dyspnea with exertion. Cardiac monitor with sinus rhythm, rare PVCs, rare PACs. Nuclear stress test September 2019 without ischemia. Echo September 2019 with LVEF=40%, trivial MR. He was tried on Cozaar in October 2019 but did not tolerate due to dizziness and presumed hypotension.  He was seen in April 2021 and reported dyspnea with exertion. Cardiac cath 06/14/19 showed patent LIMA to LAD with known occlusion of the mid LAD, patent RCA stent. There was a moderate left main stenosis and severe proximal Circumflex stenosis in a moderate caliber vessel. I attempted PCI of the Circumflex which was difficult. Balloon angioplasty of the ostial Circumflex with no stent placement. Plans for medical management of CAD and possible referral for redo CABG if his symptoms could not be controlled with medical therapy. He has started cyclodextrin which he found online with independent research and is hopeful that this will reverse some of his CAD. This is approved for use in [place]. Echo June 2023 with LVEF=55-60%. No valve disease.   He is here today for follow up. The patient denies  any chest pain, dyspnea, palpitations, lower extremity edema, orthopnea, PND, dizziness, near syncope or syncope.   Primary Care Physician: Garlan Fillers, MD  Past Medical History:  Diagnosis Date   Allergic rhinitis    grass, dust   Buttock pain    CAD (coronary artery disease)    Last cath 2010 per Dr. 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 Silvano Rusk office)   Myeloma [facility])    "found it in my chest when they did the excision"   Myocardial infarction ([facility]) 06/2005   Non Hodgkin's lymphoma ([facility]) dx'd 06/2005    Past Surgical History:  Procedure Laterality Date   BALLOON SINUPLASTY  2014   CARDIAC CATHETERIZATION  ~ 2008   "Dr. Deborah Chalk"   CATARACT EXTRACTION W/ INTRAOCULAR LENS IMPLANT Right    colonscopy  2010   CORONARY ANGIOPLASTY WITH STENT PLACEMENT  06/17/2016   CORONARY ARTERY BYPASS GRAFT  5/07   x5. LV dysfunction.    CORONARY BALLOON ANGIOPLASTY N/A 06/14/2019   Procedure: CORONARY BALLOON ANGIOPLASTY;  Surgeon: Kathleene Hazel, MD;  [facility]: [facility];  Service: Cardiovascular;  Laterality: N/A;   CORONARY STENT INTERVENTION N/A 06/17/2016   Procedure: Coronary Stent Intervention;  Surgeon: Kathleene Hazel, MD;  Location: [facility];  Service: Cardiovascular;  Laterality: N/A;   epidural steroid injections     LAPAROSCOPIC CHOLECYSTECTOMY  1996   LEFT HEART CATH  AND CORS/GRAFTS ANGIOGRAPHY N/A 06/17/2016   Procedure: Left Heart Cath and Cors/Grafts Angiography;  Surgeon: Kathleene Hazel, MD;  Location: [facility];  Service: Cardiovascular;  Laterality: N/A;   LEFT HEART CATH AND CORS/GRAFTS ANGIOGRAPHY N/A 06/14/2019   Procedure: LEFT HEART CATH AND CORS/GRAFTS ANGIOGRAPHY;  Surgeon: Kathleene Hazel, MD;  [facility];  Service: Cardiovascular;  Laterality: N/A;   MASS EXCISION N/A 11/11/2015   Procedure: EXCISION OF MID CHEST  MASS;  Surgeon: Jimmye Norman, MD;  Location: [facility];  Service: General;  Laterality: N/A;   MASS EXCISION N/A 12/23/2015   Procedure: REEXCISION OF CHEST WALL TUMOR SITE;  Surgeon: Jimmye Norman, MD;  Location: [facility];  Service: General;  Laterality: N/A;   POPLITEAL SYNOVIAL CYST EXCISION Right    TONSILLECTOMY  1952   & Adenoidectomy    Current Outpatient Medications  Medication Sig Dispense Refill   aspirin EC 81 MG tablet Take 81 mg by mouth daily.     cholecalciferol (VITAMIN D) 1000 units tablet Take 1,000 Units by mouth in the morning and at bedtime.      clopidogrel (PLAVIX) 75 MG tablet TAKE 1 TABLET BY MOUTH EVERY DAY WITH BREAKFAST 90 tablet 3   cyclobenzaprine (FLEXERIL) 10 MG tablet Take 10 mg by mouth 2 (two) times daily.     ezetimibe (ZETIA) 10 MG tablet Take 1 tablet (10 mg total) by mouth daily. 90 tablet 1   fluticasone (FLONASE) 50 MCG/ACT nasal spray Place 1 spray into both nostrils daily as needed.     Fluticasone-Umeclidin-Vilant (TRELEGY ELLIPTA) 100-62.5-25 MCG/ACT AEPB Inhale into the lungs. DAILY PER PT     isosorbide mononitrate (IMDUR) 30 MG 24 hr tablet Take 1 tablet (30 mg total) by mouth daily. Please schedule yearly appointment for future refills. 1st attempt. Thank you 45 tablet 0   metoprolol succinate (TOPROL-XL) 25 MG 24 hr tablet TAKE 1 TABLET (25 MG TOTAL) BY MOUTH DAILY. 90 tablet 3   naltrexone (DEPADE) 50 MG tablet  Take 50 mg by mouth 2 (two) times daily.      nitroGLYCERIN (NITROSTAT) 0.4 MG SL tablet Place 1 tablet (0.4 mg total) under the tongue every 5 (five) minutes as needed for chest pain. 25 tablet 3   rosuvastatin (CRESTOR) 40 MG tablet Take 1 tablet (40 mg total) by mouth daily. 90 tablet 0   No current facility-administered medications for this visit.    Allergies  Allergen Reactions   Niacin And Related Nausea And Vomiting   Niaspan [Niacin Er] Nausea And Vomiting and Other (See Comments)    Stomach intolerance    Social History   Socioeconomic History   Marital status: Married    Spouse name: Rosalita Chessman   Number of children: 2   Years of education: college   Highest education level: Not on file  Occupational History   Occupation: retired  Tobacco Use  Smoking status: Former    Packs/day: 2.00    Years: 30.00    Total pack years: 60.00    Types: Cigarettes    Quit date: 05/16/2000    Years since quitting: 21.7   Smokeless tobacco: Never   Tobacco comments:    "quit 06/2005"  Vaping Use   Vaping Use: Never used  Substance and Sexual Activity   Alcohol use: No    Alcohol/week: 0.0 standard drinks of alcohol   Drug use: No   Sexual activity: Not on file  Other Topics Concern   Not on file  Social History Narrative   Married, 2 children.   Runs an executive recruiting company.    Left-handed.   Caffeine: iced tea, green tea; tea daily    Social Determinants of Health   Financial Resource Strain: Not on file  Food Insecurity: Not on file  Transportation Needs: Not on file  Physical Activity: Not on file  Stress: Not on file  Social Connections: Not on file  Intimate Partner Violence: Not on file    Family History  Problem Relation Age of Onset   Heart disease Father    Heart attack Father    Congestive Heart Failure Father    COPD Father    Dementia Mother    Cancer Paternal Grandfather        ? type    Review of Systems:  As stated in the HPI and  otherwise negative.   There were no vitals taken for this visit.  Physical Examination:  General: Well developed, well nourished, NAD  HEENT: OP clear, mucus membranes moist  SKIN: warm, dry. No rashes. Neuro: No focal deficits  Musculoskeletal: Muscle strength 5/5 all ext  Psychiatric: Mood and affect normal  Neck: No JVD, no carotid bruits, no thyromegaly, no lymphadenopathy.  Lungs:Clear bilaterally, no wheezes, rhonci, crackles Cardiovascular: Regular rate and rhythm. No murmurs, gallops or rubs. Abdomen:Soft. Bowel sounds present. Non-tender.  Extremities: No lower extremity edema. Pulses are 2 + in the bilateral DP/PT.  Echo June 2023:   1. Left ventricular ejection fraction, by estimation, is 55 to 60%. The  left ventricle has normal function. The left ventricle demonstrates  regional wall motion abnormalities (see scoring diagram/findings for  description). Left ventricular diastolic  parameters are consistent with Grade I diastolic dysfunction (impaired  relaxation).   2. Right ventricular systolic function is normal. The right ventricular  size is normal. Tricuspid regurgitation signal is inadequate for assessing  PA pressure.   3. The mitral valve is grossly normal. No evidence of mitral valve  regurgitation. No evidence of mitral stenosis.   4. The aortic valve is tricuspid. Aortic valve regurgitation is not  visualized. No aortic stenosis is present.   5. The inferior vena cava is normal in size with greater than 50%  respiratory variability, suggesting right atrial pressure of 3 mmHg.   EKG:  EKG is *** ordered today. The ekg ordered today demonstrates   Recent Labs: No results found for requested labs within last 365 days.   Lipid Panel Followed in primary care   Wt Readings from Last 3 Encounters:  12/29/21 94 kg  08/29/21 92.8 kg  07/10/21 93.4 kg    Assessment and Plan:   1. CAD s/p CABG with angina: He has extensive CAD with no good targets for  PCI. No chest pain but he is having ***.  Continue ASA, Plavix, Imdur, beta blocker and statin.     2. Hyperlipidemia: Lipids followed  in primary care. LDL ***. Continue statin and Zetia.   3. Ischemic cardiomyopathy: LV function normal by echo in June 2023. He did not tolerate low dose Cozaar due to hypotension and dizziness. I suspect that he will not tolerate afterload reducing agents. Will continue the beta blocker.  Labs/ tests ordered today include:  No orders of the defined types were placed in this encounter.  Disposition:   F/U with me one year  Signed, Verne Carrow, MD 02/15/2022 3:18 PM    [facility] [address] [place], [place], [place]  [address] Phone: [phone]; Fax: [phone]

## 2022-02-15 NOTE — H&P (View-Only) (Signed)
Chief Complaint  Patient presents with   Follow-up    CAD     History of Present Illness: 77 yo male with history of CAD s/p CABG 2007, Non-hodgkins Lymphoma, ischemic cardiomyopathy and HLD here today for cardiac follow up. He had a 4V CABG in 2007. Cath in 2010 showed occluded SVG to Diagonal and occluded SVG to PDA/PLA with patent LIMA graft to the  LAD. There was diffuse disease in distal vessels felt best to be managed medically. 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 Echo April 2018 with LVEF=40-45% and hypokinesis of the anteroseptal wall and apex. Nuclear stress test April 2018 with possible ischemia. Cardiac cath 06/17/16 with patent LIMA to LAD, all other grafts known to be occluded. Severe stenosis in the proximal RCA treated with a drug eluting stent. He was seen in our office August 2019 by Lyda Jester, PA-C with c/o dyspnea with exertion. Cardiac monitor with sinus rhythm, rare PVCs, rare PACs. Nuclear stress test September 2019 without ischemia. Echo September 2019 with LVEF=40%, trivial MR. He was tried on Cozaar in October 2019 but did not tolerate due to dizziness and presumed hypotension.  He was seen in April 2021 and reported dyspnea with exertion. Cardiac cath 06/14/19 showed patent LIMA to LAD with known occlusion of the mid LAD, patent RCA stent. There was a moderate left main stenosis and severe proximal Circumflex stenosis in a moderate caliber vessel. I attempted PCI of the Circumflex which was difficult. Balloon angioplasty of the ostial Circumflex with no stent placement as I could not effectively expand the lesion due to tortuosity and calcification. The vessel was not felt to be favorable for further attempts at PCI given difficult anatomy. Plans for medical management of CAD and possible referral for redo CABG if his symptoms could not be controlled with medical therapy. He has started cyclodextrin which he found online with independent  research and is hopeful that this will reverse some of his CAD. This is approved for use in [place]. Echo June 2023 with LVEF=55-60%. No valve disease. He has been seen over the past few months by Dr. Shearon Stalls with Pulmonary and is felt to have sleep apnea and mild COPD. He has had no improvement in dyspnea with use of supplemental O2.   He is here today for follow up. He describes ongoing dyspnea as above. He has chest pain at times that lasts for several seconds. Dyspnea with minimal exertion. The patient denies any palpitations, lower extremity edema, orthopnea, PND, dizziness, near syncope or syncope.   Primary Care Physician: Donnajean Lopes, MD  Past Medical History:  Diagnosis Date   Allergic rhinitis    grass, dust   Buttock pain    CAD (coronary artery disease)    Last cath 2010 per Dr. Doreatha Lew with diffuse multivessel disease, small caliber vessels not felt to be suitable for PCI   Chronic left hip pain    Colon polyps    pt says a colonoscopy did not confirm this, said there was nothing there   Diverticulosis of sigmoid colon 2010   colonoscopy - [facility]   Dizziness    1 episode    Fibromyalgia    GERD (gastroesophageal reflux disease)    Head pain    "not chronic; I have myofasial tightening in my head that causes pain in my skull" (06/17/2016)   HLD (hyperlipidemia)    Hypertension    Leiomyosarcoma of chest wall ([place]) 11/2015  surgical excision alone recommended (per notes from Dr. Shon Baton office)   Myeloma [facility])    "found it in my chest when they did the excision"   Myocardial infarction ([place]) 06/2005   Non Hodgkin's lymphoma ([place]) dx'd 06/2005    Past Surgical History:  Procedure Laterality Date   BALLOON SINUPLASTY  2014   CARDIAC CATHETERIZATION  ~ 2008   "Dr. Doreatha Lew"   CATARACT EXTRACTION W/ INTRAOCULAR LENS IMPLANT Right    colonscopy  2010   CORONARY ANGIOPLASTY WITH STENT PLACEMENT  06/17/2016   CORONARY ARTERY BYPASS GRAFT  5/07   x5. LV  dysfunction.    CORONARY BALLOON ANGIOPLASTY N/A 06/14/2019   Procedure: CORONARY BALLOON ANGIOPLASTY;  Surgeon: Burnell Blanks, MD;  Location: [place] [facility];  Service: Cardiovascular;  Laterality: N/A;   CORONARY STENT INTERVENTION N/A 06/17/2016   Procedure: Coronary Stent Intervention;  Surgeon: Burnell Blanks, MD;  Location: [place] [facility];  Service: Cardiovascular;  Laterality: N/A;   epidural steroid injections     LAPAROSCOPIC CHOLECYSTECTOMY  1996   LEFT HEART CATH AND CORS/GRAFTS ANGIOGRAPHY N/A 06/17/2016   Procedure: Left Heart Cath and Cors/Grafts Angiography;  Surgeon: Burnell Blanks, MD;  Location: [place] [facility];  Service: Cardiovascular;  Laterality: N/A;   LEFT HEART CATH AND CORS/GRAFTS ANGIOGRAPHY N/A 06/14/2019   Procedure: LEFT HEART CATH AND CORS/GRAFTS ANGIOGRAPHY;  Surgeon: Burnell Blanks, MD;  Location: [place] [facility];  Service: Cardiovascular;  Laterality: N/A;   MASS EXCISION N/A 11/11/2015   Procedure: EXCISION OF MID CHEST  MASS;  Surgeon: Judeth Horn, MD;  Location: [place];  Service: General;  Laterality: N/A;   MASS EXCISION N/A 12/23/2015   Procedure: REEXCISION OF CHEST WALL TUMOR SITE;  Surgeon: Judeth Horn, MD;  Location: [place];  Service: General;  Laterality: N/A;   POPLITEAL SYNOVIAL CYST EXCISION Right    TONSILLECTOMY  1952   & Adenoidectomy    Current Outpatient Medications  Medication Sig Dispense Refill   amLODipine (NORVASC) 5 MG tablet Take 5 mg by mouth daily.     aspirin EC 81 MG tablet Take 81 mg by mouth daily.     BREZTRI AEROSPHERE 160-9-4.8 MCG/ACT AERO in the morning and at bedtime.     cholecalciferol (VITAMIN D) 1000 units tablet Take 1,000 Units by mouth in the morning and at bedtime.      clopidogrel (PLAVIX) 75 MG tablet TAKE 1 TABLET BY MOUTH EVERY DAY WITH BREAKFAST 90 tablet 3   cyclobenzaprine (FLEXERIL) 10 MG tablet Take 10 mg by mouth 2 (two)  times daily.     ezetimibe (ZETIA) 10 MG tablet Take 1 tablet (10 mg total) by mouth daily. 90 tablet 1   fluticasone (FLONASE) 50 MCG/ACT nasal spray Place 1 spray into both nostrils daily as needed.     Fluticasone-Umeclidin-Vilant (TRELEGY ELLIPTA) 100-62.5-25 MCG/ACT AEPB Inhale into the lungs. DAILY PER PT     isosorbide mononitrate (IMDUR) 30 MG 24 hr tablet Take 1 tablet (30 mg total) by mouth daily. Please schedule yearly appointment for future refills. 1st attempt. Thank you 45 tablet 0   metoprolol succinate (TOPROL-XL) 25 MG 24 hr tablet TAKE 1 TABLET (25 MG TOTAL) BY MOUTH DAILY. 90 tablet 3   naltrexone (DEPADE) 50 MG tablet Take 50 mg by mouth 2 (two) times daily.      nitroGLYCERIN (NITROSTAT) 0.4 MG SL tablet Place 1 tablet (0.4 mg total) under the tongue every  5 (five) minutes as needed for chest pain. 25 tablet 3   rosuvastatin (CRESTOR) 40 MG tablet Take 1 tablet (40 mg total) by mouth daily. 90 tablet 0   No current facility-administered medications for this visit.    Allergies  Allergen Reactions   Niacin And Related Nausea And Vomiting   Niaspan [Niacin Er] Nausea And Vomiting and Other (See Comments)    Stomach intolerance    Social History   Socioeconomic History   Marital status: Married    Spouse name: [name]   Number of children: 2   Years of education: college   Highest education level: Not on file  Occupational History   Occupation: retired  Tobacco Use   Smoking status: Former    Packs/day: 2.00    Years: 30.00    Total pack years: 60.00    Types: Cigarettes    Quit date: 05/16/2000    Years since quitting: 21.7   Smokeless tobacco: Never   Tobacco comments:    "quit 06/2005"  Vaping Use   Vaping Use: Never used  Substance and Sexual Activity   Alcohol use: No    Alcohol/week: 0.0 standard drinks of alcohol   Drug use: No   Sexual activity: Not on file  Other Topics Concern   Not on file  Social History Narrative   Married, 2 children.    Runs an executive recruiting company.    Left-handed.   Caffeine: iced tea, green tea; tea daily    Social Determinants of Health   Financial Resource Strain: Not on file  Food Insecurity: Not on file  Transportation Needs: Not on file  Physical Activity: Not on file  Stress: Not on file  Social Connections: Not on file  Intimate Partner Violence: Not on file    Family History  Problem Relation Age of Onset   Heart disease Father    Heart attack Father    Congestive Heart Failure Father    COPD Father    Dementia Mother    Cancer Paternal Grandfather        ? type    Review of Systems:  As stated in the HPI and otherwise negative.   BP 124/70   Pulse 80   Ht '5\' 11"'$  (1.803 m)   Wt 89.8 kg   SpO2 96%   BMI 27.62 kg/m   Physical Examination:  General: Well developed, well nourished, NAD  HEENT: OP clear, mucus membranes moist  SKIN: warm, dry. No rashes. Neuro: No focal deficits  Musculoskeletal: Muscle strength 5/5 all ext  Psychiatric: Mood and affect normal  Neck: No JVD, no carotid bruits, no thyromegaly, no lymphadenopathy.  Lungs:Clear bilaterally, no wheezes, rhonci, crackles Cardiovascular: Regular rate and rhythm. No murmurs, gallops or rubs. Abdomen:Soft. Bowel sounds present. Non-tender.  Extremities: No lower extremity edema. Pulses are 2 + in the bilateral DP/PT.  Echo June 2023:   1. Left ventricular ejection fraction, by estimation, is 55 to 60%. The  left ventricle has normal function. The left ventricle demonstrates  regional wall motion abnormalities (see scoring diagram/findings for  description). Left ventricular diastolic  parameters are consistent with Grade I diastolic dysfunction (impaired  relaxation).   2. Right ventricular systolic function is normal. The right ventricular  size is normal. Tricuspid regurgitation signal is inadequate for assessing  PA pressure.   3. The mitral valve is grossly normal. No evidence of mitral valve   regurgitation. No evidence of mitral stenosis.   4. The aortic valve is  tricuspid. Aortic valve regurgitation is not  visualized. No aortic stenosis is present.   5. The inferior vena cava is normal in size with greater than 50%  respiratory variability, suggesting right atrial pressure of 3 mmHg.   EKG:  EKG is ordered today. The ekg ordered today demonstrates NSR  Recent Labs: No results found for requested labs within last 365 days.   Lipid Panel Followed in primary care   Wt Readings from Last 3 Encounters:  02/16/22 89.8 kg  12/29/21 94 kg  08/29/21 92.8 kg    Assessment and Plan:   1. CAD s/p CABG with unstable angina: He has extensive CAD with no good targets for PCI by cath in 2021. He has done well with medical management of his CAD but over the past six months has been having dyspnea which is lifestyle limiting. He has had no relief with supplemental O2. His COPD is felt to be mild by the pulmonary team. His dyspnea may be his anginal equivalent. I have told him that he will likely not be a candidate for PCI as his anatomy has not been favorable. We will repeat his cardiac cath on 02/26/22 to see if there are any options for PCI. If no options for PCI, will refer to CT surgery for consideration for redo CABG.  -I have reviewed the risks, indications, and alternatives to cardiac catheterization, possible angioplasty, and stenting with the patient. Risks include but are not limited to bleeding, infection, vascular injury, stroke, myocardial infection, arrhythmia, kidney injury, radiation-related injury in the case of prolonged fluoroscopy use, emergency cardiac surgery, and death. The patient understands the risks of serious complication is 1-2 in 4008 with diagnostic cardiac cath and 1-2% or less with angioplasty/stenting.  -Continue ASA, Plavix, Imdur, beta blocker and statin.    -Pre-cath labs today  2. Hyperlipidemia: Lipids followed in primary care. LDL 62 in August 2023.  Continue statin and Zetia.   3. Ischemic cardiomyopathy: LV function normal by echo in June 2023. He did not tolerate low dose Cozaar due to hypotension and dizziness. I suspect that he will not tolerate afterload reducing agents. Will continue the beta blocker.  Labs/ tests ordered today include:   Orders Placed This Encounter  Procedures   CBC   Basic metabolic panel   EKG [id number]   Disposition:   F/U with me post cath  Signed, Lauree Chandler, MD 02/16/2022 3:37 PM    [facility] [address], [place], [place]  [address] Phone: [phone]; Fax: [phone]

## 2022-02-16 ENCOUNTER — Encounter: Payer: Self-pay | Admitting: Cardiovascular Disease

## 2022-02-16 ENCOUNTER — Ambulatory Visit: Payer: PPO | Attending: Cardiovascular Disease | Admitting: Cardiovascular Disease

## 2022-02-16 VITALS — BP 124/70 | HR 80 | Ht 71.0 in | Wt 198.0 lb

## 2022-02-16 DIAGNOSIS — I255 Ischemic cardiomyopathy: Secondary | ICD-10-CM

## 2022-02-16 DIAGNOSIS — E7800 Pure hypercholesterolemia, unspecified: Secondary | ICD-10-CM

## 2022-02-16 DIAGNOSIS — Z01812 Encounter for preprocedural laboratory examination: Secondary | ICD-10-CM | POA: Diagnosis not present

## 2022-02-16 DIAGNOSIS — I25110 Atherosclerotic heart disease of native coronary artery with unstable angina pectoris: Secondary | ICD-10-CM | POA: Diagnosis not present

## 2022-02-16 NOTE — Patient Instructions (Addendum)
Medication Instructions:  No changes *If you need a refill on your cardiac medications before your next appointment, please call your pharmacy*   Lab Work: Today: cbc, bmet   Testing/Procedures: Your physician has requested that you have a cardiac catheterization. Cardiac catheterization is used to diagnose and/or treat various heart conditions. Doctors may recommend this procedure for a number of different reasons. The most common reason is to evaluate chest pain. Chest pain can be a symptom of coronary artery disease (CAD), and cardiac catheterization can show whether plaque is narrowing or blocking your heart's arteries. This procedure is also used to evaluate the valves, as well as measure the blood flow and oxygen levels in different parts of your heart. For further information please visit [email or web address]. Please follow instruction sheet, as given.   Follow-Up: To be determined after cath per Dr. Angelena Form         Cardiac/Peripheral Catheterization   You are scheduled for a Cardiac Catheterization on Thursday, January 18 with Dr. Lauree Chandler.  1. Please arrive at the Main Entrance A at [facility]: [place], [place] [address] on January 18 at 7:00 AM (This time is two hours before your procedure to ensure your preparation). Free valet parking service is available. You will check in at ADMITTING. The support person will be asked to wait in the waiting room.  It is OK to have someone drop you off and come back when you are ready to be discharged.        Special note: Every effort is made to have your procedure done on time. Please understand that emergencies sometimes delay scheduled procedures.   . 2. Diet: Do not eat solid foods after midnight.  You may have clear liquids until 5 AM the day of the procedure.  3. Labs: You will need to have blood drawn today, you do not need to be fasting.  4. Medication instructions in preparation for your  procedure:   Contrast Allergy: No   On the morning of your procedure, take aspirin, Plavix/Clopidogrel and any morning medicines NOT listed above.  You may use sips of water.  5. Plan to go home the same day, you will only stay overnight if medically necessary. 6. You MUST have a responsible adult to drive you home. 7. An adult MUST be with you the first 24 hours after you arrive home. 8. Bring a current list of your medications, and the last time and date medication taken. 9. Bring ID and current insurance cards. 10.Please wear clothes that are easy to get on and off and wear slip-on shoes.  Thank you for allowing [place] to care for you!   -- [facility]

## 2022-02-17 LAB — BASIC METABOLIC PANEL
BUN/Creatinine Ratio: 20 (ref 10–24)
BUN: 27 mg/dL (ref 8–27)
CO2: 22 mmol/L (ref 20–29)
Calcium: 9.2 mg/dL (ref 8.6–10.2)
Chloride: 104 mmol/L (ref 96–106)
Creatinine, Ser: 1.34 mg/dL — ABNORMAL HIGH (ref 0.76–1.27)
Glucose: 109 mg/dL — ABNORMAL HIGH (ref 70–99)
Potassium: 4.7 mmol/L (ref 3.5–5.2)
Sodium: 140 mmol/L (ref 134–144)
eGFR: 55 mL/min/{1.73_m2} — ABNORMAL LOW (ref >59)

## 2022-02-17 LAB — CBC
Hematocrit: 39.9 % (ref 37.5–51.0)
Hemoglobin: 12.7 g/dL — ABNORMAL LOW (ref 13.0–17.7)
MCH: 29.3 pg (ref 26.6–33.0)
MCHC: 31.8 g/dL (ref 31.5–35.7)
MCV: 92 fL (ref 79–97)
Platelets: 263 10*3/uL (ref 150–450)
RBC: 4.33 x10E6/uL (ref 4.14–5.80)
RDW: 12.8 % (ref 11.6–15.4)
WBC: 9.7 10*3/uL (ref 3.4–10.8)

## 2022-02-25 ENCOUNTER — Telehealth: Payer: Self-pay | Admitting: *Deleted

## 2022-02-25 NOTE — Telephone Encounter (Signed)
Cardiac Catheterization scheduled at [facility] for: Thursday February 26, 2022 9 AM Arrival time and place: [place] Entrance A at: 7 AM  Nothing to eat after midnight prior to procedure, clear liquids until 5 AM day of procedure.  Medication instructions: -Usual morning medications can be taken with sips of water including aspirin 81 mg and Plavix 75 mg.  Confirmed patient has responsible adult to drive home post procedure and be with patient first 24 hours after arriving home.  Patient reports no new symptoms concerning for COVID-19 in the past 10 days.  Reviewed procedure instructions with patient.

## 2022-02-26 ENCOUNTER — Encounter (HOSPITAL_COMMUNITY): Payer: Self-pay | Admitting: Cardiovascular Disease

## 2022-02-26 ENCOUNTER — Other Ambulatory Visit: Payer: Self-pay

## 2022-02-26 ENCOUNTER — Encounter (HOSPITAL_COMMUNITY)
Admission: RE | Disposition: A | Discharge status: Home / Self Care | Payer: Self-pay | Source: Home / Self Care | Attending: Cardiovascular Disease

## 2022-02-26 ENCOUNTER — Ambulatory Visit (HOSPITAL_COMMUNITY)
Admission: RE | Admit: 2022-02-26 | Discharge: 2022-02-26 | Disposition: A | Discharge status: Home / Self Care | Payer: PPO | Attending: Cardiovascular Disease | Admitting: Cardiovascular Disease

## 2022-02-26 DIAGNOSIS — Z87891 Personal history of nicotine dependence: Secondary | ICD-10-CM | POA: Diagnosis not present

## 2022-02-26 DIAGNOSIS — Z951 Presence of aortocoronary bypass graft: Secondary | ICD-10-CM | POA: Diagnosis not present

## 2022-02-26 DIAGNOSIS — I2584 Coronary atherosclerosis due to calcified coronary lesion: Secondary | ICD-10-CM | POA: Diagnosis not present

## 2022-02-26 DIAGNOSIS — I25112 Atherosclerotic heart disease of native coronary artery with refractory angina pectoris: Secondary | ICD-10-CM

## 2022-02-26 DIAGNOSIS — Z8572 Personal history of non-Hodgkin lymphomas: Secondary | ICD-10-CM | POA: Insufficient documentation

## 2022-02-26 DIAGNOSIS — I2582 Chronic total occlusion of coronary artery: Secondary | ICD-10-CM | POA: Insufficient documentation

## 2022-02-26 DIAGNOSIS — Z8249 Family history of ischemic heart disease and other diseases of the circulatory system: Secondary | ICD-10-CM | POA: Insufficient documentation

## 2022-02-26 DIAGNOSIS — J449 Chronic obstructive pulmonary disease, unspecified: Secondary | ICD-10-CM | POA: Insufficient documentation

## 2022-02-26 DIAGNOSIS — Z7982 Long term (current) use of aspirin: Secondary | ICD-10-CM | POA: Insufficient documentation

## 2022-02-26 DIAGNOSIS — I25700 Atherosclerosis of coronary artery bypass graft(s), unspecified, with unstable angina pectoris: Secondary | ICD-10-CM | POA: Diagnosis present

## 2022-02-26 DIAGNOSIS — E785 Hyperlipidemia, unspecified: Secondary | ICD-10-CM | POA: Insufficient documentation

## 2022-02-26 DIAGNOSIS — G4730 Sleep apnea, unspecified: Secondary | ICD-10-CM | POA: Diagnosis not present

## 2022-02-26 DIAGNOSIS — Z79899 Other long term (current) drug therapy: Secondary | ICD-10-CM | POA: Diagnosis not present

## 2022-02-26 DIAGNOSIS — I25110 Atherosclerotic heart disease of native coronary artery with unstable angina pectoris: Secondary | ICD-10-CM | POA: Diagnosis not present

## 2022-02-26 DIAGNOSIS — Z7902 Long term (current) use of antithrombotics/antiplatelets: Secondary | ICD-10-CM | POA: Diagnosis not present

## 2022-02-26 DIAGNOSIS — I255 Ischemic cardiomyopathy: Secondary | ICD-10-CM | POA: Insufficient documentation

## 2022-02-26 HISTORY — PX: LEFT HEART CATH AND CORS/GRAFTS ANGIOGRAPHY: CATH118250

## 2022-02-26 SURGERY — LEFT HEART CATH AND CORS/GRAFTS ANGIOGRAPHY
Anesthesia: LOCAL

## 2022-02-26 MED ORDER — HEPARIN SODIUM (PORCINE) 1000 UNIT/ML IJ SOLN
INTRAMUSCULAR | Status: DC | PRN
  Administered 2022-02-26: 4500 [IU] via INTRAVENOUS

## 2022-02-26 MED ORDER — MIDAZOLAM HCL 2 MG/2ML IJ SOLN
INTRAMUSCULAR | Status: AC
  Filled 2022-02-26: qty 2

## 2022-02-26 MED ORDER — MIDAZOLAM HCL 2 MG/2ML IJ SOLN
INTRAMUSCULAR | Status: DC | PRN
  Administered 2022-02-26 (×2): 1 mg via INTRAVENOUS

## 2022-02-26 MED ORDER — SODIUM CHLORIDE 0.9 % IV SOLN: INTRAVENOUS | Status: AC

## 2022-02-26 MED ORDER — HEPARIN SODIUM (PORCINE) 1000 UNIT/ML IJ SOLN
INTRAMUSCULAR | Status: AC
  Filled 2022-02-26: qty 10

## 2022-02-26 MED ORDER — SODIUM CHLORIDE 0.9 % IV SOLN: 250.0000 mL | INTRAVENOUS | Status: DC | PRN

## 2022-02-26 MED ORDER — SODIUM CHLORIDE 0.9% FLUSH: 3.0000 mL | INTRAVENOUS | Status: DC | PRN

## 2022-02-26 MED ORDER — SODIUM CHLORIDE 0.9% FLUSH: 3.0000 mL | Freq: Two times a day (BID) | INTRAVENOUS | Status: DC

## 2022-02-26 MED ORDER — FENTANYL CITRATE (PF) 100 MCG/2ML IJ SOLN
INTRAMUSCULAR | Status: DC | PRN
  Administered 2022-02-26 (×2): 25 ug via INTRAVENOUS

## 2022-02-26 MED ORDER — SODIUM CHLORIDE 0.9 % WEIGHT BASED INFUSION
3.0000 mL/kg/h | INTRAVENOUS | Status: AC
  Administered 2022-02-26: 3 mL/kg/h via INTRAVENOUS

## 2022-02-26 MED ORDER — ACETAMINOPHEN 325 MG PO TABS: 650.0000 mg | ORAL_TABLET | ORAL | Status: DC | PRN

## 2022-02-26 MED ORDER — ONDANSETRON HCL 4 MG/2ML IJ SOLN: 4.0000 mg | Freq: Four times a day (QID) | INTRAMUSCULAR | Status: DC | PRN

## 2022-02-26 MED ORDER — LIDOCAINE HCL (PF) 1 % IJ SOLN
INTRAMUSCULAR | Status: DC | PRN
  Administered 2022-02-26: 2 mL

## 2022-02-26 MED ORDER — FENTANYL CITRATE (PF) 100 MCG/2ML IJ SOLN
INTRAMUSCULAR | Status: AC
  Filled 2022-02-26: qty 2

## 2022-02-26 MED ORDER — HEPARIN (PORCINE) IN NACL 1000-0.9 UT/500ML-% IV SOLN
INTRAVENOUS | Status: DC | PRN
  Administered 2022-02-26: 500 mL via INTRA_ARTERIAL
  Administered 2022-02-26: 500 mL
  Administered 2022-02-26: 500 mL via INTRA_ARTERIAL
  Administered 2022-02-26: 500 mL

## 2022-02-26 MED ORDER — LABETALOL HCL 5 MG/ML IV SOLN: 10.0000 mg | INTRAVENOUS | Status: DC | PRN

## 2022-02-26 MED ORDER — SODIUM CHLORIDE 0.9 % WEIGHT BASED INFUSION: 1.0000 mL/kg/h | INTRAVENOUS | Status: DC

## 2022-02-26 MED ORDER — VERAPAMIL HCL 2.5 MG/ML IV SOLN
INTRAVENOUS | Status: DC | PRN
  Administered 2022-02-26: 10 mL via INTRA_ARTERIAL

## 2022-02-26 MED ORDER — LIDOCAINE HCL (PF) 1 % IJ SOLN
INTRAMUSCULAR | Status: AC
  Filled 2022-02-26: qty 30

## 2022-02-26 MED ORDER — VERAPAMIL HCL 2.5 MG/ML IV SOLN
INTRAVENOUS | Status: AC
  Filled 2022-02-26: qty 2

## 2022-02-26 MED ORDER — HEPARIN (PORCINE) IN NACL 1000-0.9 UT/500ML-% IV SOLN
INTRAVENOUS | Status: AC
  Filled 2022-02-26: qty 1000

## 2022-02-26 MED ORDER — HYDRALAZINE HCL 20 MG/ML IJ SOLN: 10.0000 mg | INTRAMUSCULAR | Status: DC | PRN

## 2022-02-26 MED ORDER — IOHEXOL 350 MG/ML SOLN
INTRAVENOUS | Status: DC | PRN
  Administered 2022-02-26: 45 mL via INTRA_ARTERIAL

## 2022-02-26 MED ORDER — ASPIRIN 81 MG PO CHEW
81.0000 mg | CHEWABLE_TABLET | ORAL | Status: AC
  Administered 2022-02-26: 81 mg via ORAL
  Filled 2022-02-26: qty 1

## 2022-02-26 SURGICAL SUPPLY — 12 items
CATH 5FR JL3.5 JR4 ANG PIG MP (CATHETERS) IMPLANT
CATH INFINITI 5 FR IM (CATHETERS) IMPLANT
DEVICE RAD COMP TR BAND LRG (VASCULAR PRODUCTS) IMPLANT
ELECT DEFIB PAD ADLT CADENCE (PAD) IMPLANT
GLIDESHEATH SLEND SS 6F .021 (SHEATH) IMPLANT
GUIDEWIRE INQWIRE 1.5J.035X260 (WIRE) IMPLANT
INQWIRE 1.5J .035X260CM (WIRE) ×1
KIT HEART LEFT (KITS) ×1 IMPLANT
PACK CARDIAC CATHETERIZATION (CUSTOM PROCEDURE TRAY) ×1 IMPLANT
SHEATH PROBE COVER 6X72 (BAG) IMPLANT
TRANSDUCER W/STOPCOCK (MISCELLANEOUS) ×1 IMPLANT
TUBING CIL FLEX 10 FLL-RA (TUBING) ×1 IMPLANT

## 2022-02-26 NOTE — Progress Notes (Signed)
Patient and son was given discharge instructions. Both verbalized understanding.

## 2022-02-26 NOTE — Interval H&P Note (Signed)
History and Physical Interval Note:  02/26/2022 7:37 AM  Joseph Soto  has presented today for surgery, with the diagnosis of cad - unstable angina.  The various methods of treatment have been discussed with the patient and family. After consideration of risks, benefits and other options for treatment, the patient has consented to  Procedure(s): LEFT HEART CATH AND CORS/GRAFTS ANGIOGRAPHY (N/A) as a surgical intervention.  The patient's history has been reviewed, patient examined, no change in status, stable for surgery.  I have reviewed the patient's chart and labs.  Questions were answered to the patient's satisfaction.    Cath Lab Visit (complete for each Cath Lab visit)  Clinical Evaluation Leading to the Procedure:   ACS: No.  Non-ACS:    Anginal Classification: CCS III  Anti-ischemic medical therapy: Maximal Therapy (2 or more classes of medications)  Non-Invasive Test Results: No non-invasive testing performed  Prior CABG: Previous CABG        Lauree Chandler

## 2022-02-26 NOTE — Discharge Instructions (Signed)

## 2022-02-27 ENCOUNTER — Encounter: Payer: Self-pay | Admitting: Cardiovascular Disease

## 2022-03-02 ENCOUNTER — Encounter: Payer: Self-pay | Admitting: Nurse Practitioner

## 2022-03-02 ENCOUNTER — Ambulatory Visit (INDEPENDENT_AMBULATORY_CARE_PROVIDER_SITE_OTHER): Payer: PPO | Admitting: Nurse Practitioner

## 2022-03-02 VITALS — BP 128/80 | HR 93 | Ht 71.0 in | Wt 200.2 lb

## 2022-03-02 DIAGNOSIS — J449 Chronic obstructive pulmonary disease, unspecified: Secondary | ICD-10-CM

## 2022-03-02 DIAGNOSIS — G4734 Idiopathic sleep related nonobstructive alveolar hypoventilation: Secondary | ICD-10-CM | POA: Diagnosis not present

## 2022-03-02 DIAGNOSIS — J439 Emphysema, unspecified: Secondary | ICD-10-CM

## 2022-03-02 DIAGNOSIS — J4489 Other specified chronic obstructive pulmonary disease: Secondary | ICD-10-CM

## 2022-03-02 DIAGNOSIS — I25810 Atherosclerosis of coronary artery bypass graft(s) without angina pectoris: Secondary | ICD-10-CM | POA: Diagnosis not present

## 2022-03-02 MED ORDER — BREZTRI AEROSPHERE 160-9-4.8 MCG/ACT IN AERO: 2.0000 | INHALATION_SPRAY | Freq: Two times a day (BID) | RESPIRATORY_TRACT | 11 refills | Status: DC

## 2022-03-02 NOTE — Assessment & Plan Note (Addendum)
Desaturations on HST likely secondary to cardiac and lung disease. He continues on supplemental oxygen 2 lpm at night.

## 2022-03-02 NOTE — Assessment & Plan Note (Signed)
See above

## 2022-03-02 NOTE — Assessment & Plan Note (Signed)
Moderate COPD with FEV1 77%. The severity of his dyspnea was out of proportion to his lung function testing. He underwent LHC with only 1/4 patent stents. Suspect this is a major contributor to his symptoms. Plans to see CT surgery 2/5 to discuss possible redo CABG.  He will remain on triple therapy regimen with Breztri. Action plan in place. Encouraged him to remain as active as possible.   Patient Instructions  Continue Breztri 2 puffs Twice daily. Brush tongue and rinse mouth afterwards Continue Albuterol inhaler 2 puffs every 6 hours as needed for shortness of breath or wheezing. Notify if symptoms persist despite rescue inhaler/neb use.  Continue fluticasone 1 spray each nostril daily for allergies/nasal congestion Continue supplemental oxygen 2 lpm at night  Follow up with Dr. Roxan Hockey as scheduled to discuss bypass surgery   Follow up with Dr. Shearon Stalls in 4 months. If symptoms worsen, please contact office for sooner follow up or seek emergency care.

## 2022-03-02 NOTE — Patient Instructions (Addendum)
Continue Breztri 2 puffs Twice daily. Brush tongue and rinse mouth afterwards Continue Albuterol inhaler 2 puffs every 6 hours as needed for shortness of breath or wheezing. Notify if symptoms persist despite rescue inhaler/neb use.  Continue fluticasone 1 spray each nostril daily for allergies/nasal congestion Continue supplemental oxygen 2 lpm at night  Follow up with Dr. Roxan Hockey as scheduled to discuss bypass surgery   Follow up with Dr. Shearon Stalls in 4 months. If symptoms worsen, please contact office for sooner follow up or seek emergency care.

## 2022-03-02 NOTE — Progress Notes (Signed)
$'@Patient'M$  ID: Joseph Soto, male    DOB: 08-29-1945, 77 y.o.   MRN: [id number]  Chief Complaint  Patient presents with   Follow-up    Pt f/u for COPD, he states that he is still having SOB. Cardiology is scheduling him to have 4 bypasses. States the SOB is a combo of the blockages and COPD    Referring provider: Donnajean Lopes, MD  HPI: 77 year old male, former smoker (quit 2002) followed for COPD with chronic bronchitis and emphysema and nocturnal hypoxemia.  He is a patient of Dr. Mauricio Po and last seen in office 12/29/2021.  Past medical history significant for CAD status post CABG, ischemic cardiomyopathy, angina, HLD, leiomyosarcoma of chest wall.  TEST/EVENTS:  04/14/2021 CXR 2 view: Prior median sternotomy and CABG.  Pulmonary hyperinflation.  Biapical pleural-parenchymal scarring.  No acute process. 08/27/2021 PFT: FVC 95, FEV1 75, ratio 56, TLC 123, DLCO 46 12/24/2021 HST: AHI 8/h, SpO2 low 79% with average of 88%  03/02/2022: Today - follow up Patient presents today for follow up. Since he was here last, he underwent left heart catheterization which showed triple-vessel CAD with only 1 out of his 4 previous bypass grafts were patent.  He has plans to see Dr. Roxan Hockey on February 5 for consultation to discuss redo bypass.  Otherwise, he has been feeling relatively stable since he was here last.  Feels like his breathing is unchanged.  Primarily gets short winded with uphill climbing or carrying items.  Does not have any trouble when he is on level ground.  He does feel like the Judithann Sauger has helped his breathing some.  Does not feel like he gets as short winded as he used to.  No significant cough, chest congestion or wheezing.  Denies any orthopnea or lower extremity swelling. He does have bruising to his left arm from his LHC. No significant swelling. No numbness or tingling.  He had a previous home sleep study which showed mild OSA and nocturnal desaturations.  He was started on  supplemental oxygen 2 L/min.  Feels like he is doing fine with this; wearing his oxygen nightly.  Has not noticed a huge difference in his daytime symptoms.  Allergies  Allergen Reactions   Niacin And Related Nausea And Vomiting   Niaspan [Niacin Er] Nausea And Vomiting and Other (See Comments)    Stomach intolerance    Immunization History  Administered Date(s) Administered   Influenza Split 11/27/2019   Influenza, High Dose Seasonal PF 11/08/2016   Influenza-Unspecified 10/16/2010, 11/11/2015, 11/09/2017, 11/07/2021   PFIZER(Purple Top)SARS-COV-2 Vaccination 03/19/2019, 04/13/2019, 10/11/2019, 04/30/2020   Pfizer Covid-19 Vaccine Bivalent Booster 21yr & up 10/30/2020   Pneumococcal Conjugate-13 02/09/2014   Pneumococcal Polysaccharide-23 02/10/2012   Zoster Recombinat (Shingrix) 11/20/2020    Past Medical History:  Diagnosis Date   Allergic rhinitis    grass, dust   Buttock pain    CAD (coronary artery disease)    Last cath 2010 per Dr. TDoreatha Lewwith diffuse multivessel disease, small caliber vessels not felt to be suitable for PCI   Chronic left hip pain    Colon polyps    pt says a colonoscopy did not confirm this, said there was nothing there   Diverticulosis of sigmoid colon 2010   colonoscopy - [facility]   Dizziness    1 episode    Fibromyalgia    GERD (gastroesophageal reflux disease)    Head pain    "not chronic; I have myofasial tightening in my head that causes  pain in my skull" (06/17/2016)   HLD (hyperlipidemia)    Hypertension    Leiomyosarcoma of chest wall ([place]) 11/2015   surgical excision alone recommended (per notes from Dr. Shon Baton office)   [facility])    "found it in my chest when they did the excision"   Myocardial infarction ([place]) 06/2005   Non Hodgkin's lymphoma ([place]) dx'd 06/2005    Tobacco History: Social History   Tobacco Use  Smoking Status Former   Packs/day: 2.00   Years: 30.00   Total pack years: 60.00   Types: Cigarettes    Quit date: 05/16/2000   Years since quitting: 21.8  Smokeless Tobacco Never  Tobacco Comments   "quit 06/2005"   Counseling given: Not Answered Tobacco comments: "quit 06/2005"   Outpatient Medications Prior to Visit  Medication Sig Dispense Refill   acetaminophen (TYLENOL) 500 MG tablet Take 500-1,000 mg by mouth every 6 (six) hours as needed (pain.).     amLODipine (NORVASC) 5 MG tablet Take 5 mg by mouth at bedtime.     aspirin EC 81 MG tablet Take 81 mg by mouth at bedtime.     cholecalciferol (VITAMIN D) 1000 units tablet Take 1,000 Units by mouth daily with breakfast.     clopidogrel (PLAVIX) 75 MG tablet TAKE 1 TABLET BY MOUTH EVERY DAY WITH BREAKFAST 90 tablet 3   Coenzyme Q10 300 MG CAPS Take 300 mg by mouth in the morning and at bedtime.     cyclobenzaprine (FLEXERIL) 10 MG tablet Take 10 mg by mouth 2 (two) times daily.     ezetimibe (ZETIA) 10 MG tablet Take 1 tablet (10 mg total) by mouth daily. (Patient taking differently: Take 10 mg by mouth at bedtime.) 90 tablet 1   fluticasone (FLONASE) 50 MCG/ACT nasal spray Place 1 spray into both nostrils daily as needed for allergies.     isosorbide mononitrate (IMDUR) 30 MG 24 hr tablet Take 1 tablet (30 mg total) by mouth daily. Please schedule yearly appointment for future refills. 1st attempt. Thank you 45 tablet 0   metoprolol succinate (TOPROL-XL) 25 MG 24 hr tablet TAKE 1 TABLET (25 MG TOTAL) BY MOUTH DAILY. (Patient taking differently: Take 12.5 mg by mouth in the morning and at bedtime.) 90 tablet 3   naltrexone (DEPADE) 50 MG tablet Take 50 mg by mouth 2 (two) times daily.      nitroGLYCERIN (NITROSTAT) 0.4 MG SL tablet Place 1 tablet (0.4 mg total) under the tongue every 5 (five) minutes as needed for chest pain. 25 tablet 3   Omega-3 Fatty Acids (FISH OIL) 1200 MG CAPS Take 1,200 mg by mouth daily with breakfast.     rosuvastatin (CRESTOR) 40 MG tablet Take 1 tablet (40 mg total) by mouth daily. (Patient taking differently:  Take 40 mg by mouth at bedtime.) 90 tablet 0   BREZTRI AEROSPHERE 160-9-4.8 MCG/ACT AERO Inhale 2 puffs into the lungs in the morning and at bedtime.     No facility-administered medications prior to visit.     Review of Systems:   Constitutional: No weight loss or gain, night sweats, fevers, chills, or lassitude. +fatigue (baseline) HEENT: No headaches, difficulty swallowing, tooth/dental problems, or sore throat. No sneezing, itching, ear ache, nasal congestion, or post nasal drip CV:  No chest pain, orthopnea, PND, swelling in lower extremities, anasarca, dizziness, palpitations, syncope Resp: +shortness of breath with exertion. No excess mucus or change in color of mucus. No productive or non-productive. No hemoptysis. No wheezing.  No chest wall deformity GI:  No heartburn, indigestion, abdominal pain, nausea, vomiting, diarrhea, change in bowel habits, loss of appetite, bloody stools.  GU: No dysuria, change in color of urine, urgency or frequency.  Skin: No rash, lesions, ulcerations. +post operative bruising left arm MSK:  No joint pain or swelling.   Neuro: No dizziness or lightheadedness.  Psych: No depression or anxiety. Mood stable.     Physical Exam:  BP 128/80   Pulse 93   Ht '5\' 11"'$  (1.803 m)   Wt 200 lb 3.2 oz (90.8 kg)   SpO2 98%   BMI 27.92 kg/m   GEN: Pleasant, interactive, well-appearing; in no acute distress HEENT:  Normocephalic and atraumatic. PERRLA. Sclera white. Nasal turbinates pink, moist and patent bilaterally. No rhinorrhea present. Oropharynx pink and moist, without exudate or edema. No lesions, ulcerations, or postnasal drip.  NECK:  Supple w/ fair ROM. No JVD present. Normal carotid impulses w/o bruits. Thyroid symmetrical with no goiter or nodules palpated. No lymphadenopathy.   CV: RRR, no m/r/g, no peripheral edema. Pulses intact, +2 bilaterally. No cyanosis, pallor or clubbing. PULMONARY:  Unlabored, regular breathing. Clear bilaterally A&P w/o  wheezes/rales/rhonchi. No accessory muscle use.  GI: BS present and normoactive. Soft, non-tender to palpation. No organomegaly or masses detected MSK: No erythema, warmth or tenderness. Cap refil <2 sec all extrem. No deformities or joint swelling noted.  Neuro: A/Ox3. No focal deficits noted.   Skin: Warm, no lesions or rashes. Ecchymosis to left wrist and forearm; no significant edema.  Psych: Normal affect and behavior. Judgement and thought content appropriate.     Lab Results:  CBC    Component Value Date/Time   WBC 9.7 02/16/2022 1547   WBC 10.8 (H) 06/12/2017 0409   RBC 4.33 02/16/2022 1547   RBC 4.44 06/12/2017 0409   HGB 12.7 (L) 02/16/2022 1547   HGB 13.4 11/02/2011 1042   HCT 39.9 02/16/2022 1547   HCT 40.0 11/02/2011 1042   PLT 263 02/16/2022 1547   MCV 92 02/16/2022 1547   MCV 91.2 11/02/2011 1042   MCH 29.3 02/16/2022 1547   MCH 29.3 06/12/2017 0409   MCHC 31.8 02/16/2022 1547   MCHC 33.2 06/12/2017 0409   RDW 12.8 02/16/2022 1547   RDW 13.6 11/02/2011 1042   LYMPHSABS 2.1 12/20/2015 1030   LYMPHSABS 1.7 11/02/2011 1042   MONOABS 0.9 12/20/2015 1030   MONOABS 0.9 11/02/2011 1042   EOSABS 0.5 12/20/2015 1030   EOSABS 0.2 11/02/2011 1042   BASOSABS 0.1 12/20/2015 1030   BASOSABS 0.1 11/02/2011 1042    BMET    Component Value Date/Time   NA 140 02/16/2022 1547   NA 140 05/12/2012 1003   K 4.7 02/16/2022 1547   K 4.7 05/12/2012 1003   CL 104 02/16/2022 1547   CL 109 (H) 05/12/2012 1003   CO2 22 02/16/2022 1547   CO2 23 05/12/2012 1003   GLUCOSE 109 (H) 02/16/2022 1547   GLUCOSE 117 (H) 06/11/2017 1822   GLUCOSE 98 05/12/2012 1003   BUN 27 02/16/2022 1547   BUN 28.4 (H) 05/12/2012 1003   CREATININE 1.34 (H) 02/16/2022 1547   CREATININE 1.2 05/12/2012 1003   CALCIUM 9.2 02/16/2022 1547   CALCIUM 8.7 05/12/2012 1003   GFRNONAA 56 (L) 06/07/2019 1121   GFRAA 65 06/07/2019 1121    BNP No results found for: "BNP"   Imaging:  CARDIAC  CATHETERIZATION  Result Date: 02/26/2022   Ost LM to LM lesion is 40% stenosed.  LM lesion is 40% stenosed.   Ost LAD to Prox LAD lesion is 90% stenosed.   Mid LAD lesion is 100% stenosed.   Prox RCA to Mid RCA lesion is 40% stenosed.   Dist RCA-2 lesion is 50% stenosed.   Dist RCA-1 lesion is 50% stenosed.   Origin lesion is 100% stenosed.   Origin to Prox Graft lesion before Dist RCA  is 100% stenosed.   Origin to Prox Graft lesion is 100% stenosed.   Ost RPDA to RPDA lesion is 70% stenosed.   RPDA lesion is 70% stenosed.   RPAV lesion is 50% stenosed.   1st Diag lesion is 95% stenosed.   Ost Cx to Prox Cx lesion is 90% stenosed.   Mid LM to Dist LM lesion is 70% stenosed.   Non-stenotic Prox RCA lesion was previously treated.   SVG.   SVG.   SVG.   LIMA graft was visualized by angiography. 1. Triple vessel CAD s/p 4V CABG with 1/4 patent bypass grafts 2. Chronic mid LAD occlusion with patent LIMA graft to the LAD. There is a moderate caliber Diagonal branch that fills antegrade. The Diagonal branch has severe ostial stenosis. 3. Severe proximal Circumflex stenosis in a moderate caliber vessel.  The vein graft to the intermediate branch is known to be occluded by the prior cath. I attempted to work on this vessel in 2021 but due to angulation and calcification in the distal left main and Circumflex, I could not adequately treat this vessel. PCI is not an option in this segment. 4. Patent proximal RCA stent. Moderate mid RCA stenosis.  Severe stenosis in the ostium of the PDA. The sequential vein graft to the PDA and PLA is known to be occluded by prior cath. 5. Moderate calcified distal left main stenosis  Recommendations: Will continue medical management of CAD. His left main disease and proximal Circumflex disease is not amenable to percutaneous therapy. These vessels are all potential targets for bypass. The Diagonal and right sided PDA are also potential targets for bypass. I will continue medical therapy  for now. He wishes to be referred to CT surgery to discuss re-do bypass. I will place this referral. He is a very functional 77 yo male with some degree of COPD and Non Hodgkins Lymphoma in remission.         Latest Ref Rng & Units 08/27/2021    3:41 PM 01/26/2014    3:47 PM  PFT Results  FVC-Pre L 3.98  6.05   FVC-Predicted Pre % 95  136   FVC-Post L 4.06  5.54   FVC-Predicted Post % 97  125   Pre FEV1/FVC % % 57  47   Post FEV1/FCV % % 56  49   FEV1-Pre L 2.25  2.85   FEV1-Predicted Pre % 75  87   FEV1-Post L 2.25  2.73   DLCO uncorrected ml/min/mmHg 11.56  21.64   DLCO UNC% % 46  66   DLCO corrected ml/min/mmHg 11.56  21.64   DLCO COR %Predicted % 46  66   DLVA Predicted % 43  61   TLC L 8.74  10.32   TLC % Predicted % 123  146   RV % Predicted % 158  171     No results found for: "NITRICOXIDE"      Assessment & Plan:   COPD, moderate (HCC) Moderate COPD with FEV1 77%. The severity of his dyspnea was out of proportion to his lung function testing. He  underwent LHC with only 1/4 patent stents. Suspect this is a major contributor to his symptoms. Plans to see CT surgery 2/5 to discuss possible redo CABG.  He will remain on triple therapy regimen with Breztri. Action plan in place. Encouraged him to remain as active as possible.   Patient Instructions  Continue Breztri 2 puffs Twice daily. Brush tongue and rinse mouth afterwards Continue Albuterol inhaler 2 puffs every 6 hours as needed for shortness of breath or wheezing. Notify if symptoms persist despite rescue inhaler/neb use.  Continue fluticasone 1 spray each nostril daily for allergies/nasal congestion Continue supplemental oxygen 2 lpm at night  Follow up with Dr. Roxan Hockey as scheduled to discuss bypass surgery   Follow up with Dr. Shearon Stalls in 4 months. If symptoms worsen, please contact office for sooner follow up or seek emergency care.    CAD (coronary artery disease) See above  Nocturnal  hypoxemia Desaturations on HST likely secondary to cardiac and lung disease. He continues on supplemental oxygen 2 lpm at night.  I spent 28 minutes of dedicated to the care of this patient on the date of this encounter to include pre-visit review of records, face-to-face time with the patient discussing conditions above, post visit ordering of testing, clinical documentation with the electronic health record, making appropriate referrals as documented, and communicating necessary findings to members of the patients care team.  Clayton Bibles, NP 03/02/2022  Pt aware and understands NP's role.

## 2022-03-04 DIAGNOSIS — I2510 Atherosclerotic heart disease of native coronary artery without angina pectoris: Secondary | ICD-10-CM | POA: Diagnosis not present

## 2022-03-04 DIAGNOSIS — N1831 Chronic kidney disease, stage 3a: Secondary | ICD-10-CM | POA: Diagnosis not present

## 2022-03-04 DIAGNOSIS — R82998 Other abnormal findings in urine: Secondary | ICD-10-CM | POA: Diagnosis not present

## 2022-03-04 DIAGNOSIS — R5383 Other fatigue: Secondary | ICD-10-CM | POA: Diagnosis not present

## 2022-03-07 DIAGNOSIS — J449 Chronic obstructive pulmonary disease, unspecified: Secondary | ICD-10-CM | POA: Diagnosis not present

## 2022-03-12 ENCOUNTER — Inpatient Hospital Stay: Payer: PPO | Attending: Oncology | Admitting: Oncology

## 2022-03-15 ENCOUNTER — Telehealth: Payer: Self-pay | Admitting: Cardiology

## 2022-03-15 ENCOUNTER — Emergency Department (HOSPITAL_COMMUNITY)
Admission: EM | Admit: 2022-03-15 | Discharge: 2022-03-15 | Disposition: A | Discharge status: Home / Self Care | Payer: PPO | Attending: Emergency Medicine | Admitting: Emergency Medicine

## 2022-03-15 ENCOUNTER — Emergency Department (HOSPITAL_COMMUNITY): Payer: PPO

## 2022-03-15 ENCOUNTER — Encounter (HOSPITAL_COMMUNITY): Payer: Self-pay | Admitting: Emergency Medicine

## 2022-03-15 DIAGNOSIS — I10 Essential (primary) hypertension: Secondary | ICD-10-CM | POA: Diagnosis not present

## 2022-03-15 DIAGNOSIS — I2510 Atherosclerotic heart disease of native coronary artery without angina pectoris: Secondary | ICD-10-CM | POA: Insufficient documentation

## 2022-03-15 DIAGNOSIS — R079 Chest pain, unspecified: Secondary | ICD-10-CM | POA: Diagnosis not present

## 2022-03-15 DIAGNOSIS — M25511 Pain in right shoulder: Secondary | ICD-10-CM | POA: Insufficient documentation

## 2022-03-15 DIAGNOSIS — Z8679 Personal history of other diseases of the circulatory system: Secondary | ICD-10-CM

## 2022-03-15 DIAGNOSIS — J439 Emphysema, unspecified: Secondary | ICD-10-CM | POA: Diagnosis not present

## 2022-03-15 LAB — BASIC METABOLIC PANEL
Anion gap: 9 (ref 5–15)
BUN: 23 mg/dL (ref 8–23)
CO2: 23 mmol/L (ref 22–32)
Calcium: 8.6 mg/dL — ABNORMAL LOW (ref 8.9–10.3)
Chloride: 102 mmol/L (ref 98–111)
Creatinine, Ser: 1.32 mg/dL — ABNORMAL HIGH (ref 0.61–1.24)
GFR, Estimated: 56 mL/min — ABNORMAL LOW (ref >60)
Glucose, Bld: 107 mg/dL — ABNORMAL HIGH (ref 70–99)
Potassium: 4.5 mmol/L (ref 3.5–5.1)
Sodium: 134 mmol/L — ABNORMAL LOW (ref 135–145)

## 2022-03-15 LAB — CBC
HCT: 37.2 % — ABNORMAL LOW (ref 39.0–52.0)
Hemoglobin: 12.3 g/dL — ABNORMAL LOW (ref 13.0–17.0)
MCH: 30.2 pg (ref 26.0–34.0)
MCHC: 33.1 g/dL (ref 30.0–36.0)
MCV: 91.4 fL (ref 80.0–100.0)
Platelets: 242 10*3/uL (ref 150–400)
RBC: 4.07 MIL/uL — ABNORMAL LOW (ref 4.22–5.81)
RDW: 14.7 % (ref 11.5–15.5)
WBC: 9.1 10*3/uL (ref 4.0–10.5)
nRBC: 0 % (ref 0.0–0.2)

## 2022-03-15 LAB — TROPONIN I (HIGH SENSITIVITY)
Troponin I (High Sensitivity): 7 ng/L (ref <18)
Troponin I (High Sensitivity): 6 ng/L (ref <18)

## 2022-03-15 MED ORDER — ASPIRIN 81 MG PO CHEW
162.0000 mg | CHEWABLE_TABLET | ORAL | Status: DC
  Filled 2022-03-15: qty 2

## 2022-03-15 NOTE — ED Triage Notes (Signed)
Pt reports right sided shoulder pain x1 hour. Pt due to see cardiology due to blockages. Denies CP or SOB.

## 2022-03-15 NOTE — Telephone Encounter (Signed)
Patient called in with complaints of right shoulder pain radiating down into his arm.  Patient recently underwent cardiac catheterization noting 1/4 patent bypass grafts (LIMA to LAD) and has been referred to [facility] for consideration of redo bypass surgery.  States his symptoms started earlier today and have intensified.  Denies any straining of the shoulder or arm.  No shortness of breath, nausea vomiting or diaphoresis.  Reports his blood pressure is stable. Previous anginal symptoms/MI or pain in the left arm.  I advised given his symptoms and known CAD he should be evaluated in the ED over concern for ACS. He was agreeable to this plan, stated he would have his wife drive him.  Thanked me for call back.

## 2022-03-15 NOTE — ED Provider Notes (Signed)
[facility] Provider Note   CSN: [id number] Arrival date & time: 03/15/22  1414     History  Chief Complaint  Patient presents with   Shoulder Pain    CADON RACZKA is a 77 y.o. male.  77 year old male with a history of CAD status post CABG, NHL in remission, ischemic cardiomyopathy, and hyperlipidemia who presents to the emergency department with right shoulder pain.  Patient reports that approximately 1 hour ago started having atraumatic right shoulder pain.  Not worsened with movement of his shoulder.  Radiates down his right arm to his elbow.  Has difficulty characterizing the pain.  No diaphoresis or radiation otherwise.  Says it is not exertional.  No shortness of breath.  No recent illness.  Called his cardiology team who was concerned about possible MI so referred him to the emergency department for repeat evaluation.  Did have a heart catheterization in early January that showed significant disease but was not amenable to stenting.  Patient was told that he may need to follow-up with a cardiac surgeon for surgical intervention. Follows with Dr Angelena Form from heart care.   02/26/21 Cath Report Impression  1. Triple vessel CAD s/p 4V CABG with 1/4 patent bypass grafts 2. Chronic mid LAD occlusion with patent LIMA graft to the LAD. There is a moderate caliber Diagonal branch that fills antegrade. The Diagonal branch has severe ostial stenosis.  3. Severe proximal Circumflex stenosis in a moderate caliber vessel.  The vein graft to the intermediate branch is known to be occluded by the prior cath. I attempted to work on this vessel in 2021 but due to angulation and calcification in the distal left main and Circumflex, I could not adequately treat this vessel. PCI is not an option in this segment.  4. Patent proximal RCA stent. Moderate mid RCA stenosis.  Severe stenosis in the ostium of the PDA. The sequential vein graft to the PDA and PLA is  known to be occluded by prior cath.  5. Moderate calcified distal left main stenosis   Recommendations: Will continue medical management of CAD. His left main disease and proximal Circumflex disease is not amenable to percutaneous therapy. These vessels are all potential targets for bypass. The Diagonal and right sided PDA are also potential targets for bypass. I will continue medical therapy for now. He wishes to be referred to CT surgery to discuss re-do bypass. I will place this referral. He is a very functional 77 yo male with some degree of COPD and Non Hodgkins Lymphoma in remission.        Home Medications Prior to Admission medications   Medication Sig Start Date End Date Taking? Authorizing Provider  acetaminophen (TYLENOL) 500 MG tablet Take 500-1,000 mg by mouth every 6 (six) hours as needed (pain.).    [provider]  amLODipine (NORVASC) 5 MG tablet Take 5 mg by mouth at bedtime. 01/04/22   [provider]  aspirin EC 81 MG tablet Take 81 mg by mouth at bedtime.    [provider]  BREZTRI AEROSPHERE 160-9-4.8 MCG/ACT AERO Inhale 2 puffs into the lungs in the morning and at bedtime. 03/02/22   Cobb, Karie Schwalbe, NP  cholecalciferol (VITAMIN D) 1000 units tablet Take 1,000 Units by mouth daily with breakfast.    [provider]  clopidogrel (PLAVIX) 75 MG tablet TAKE 1 TABLET BY MOUTH EVERY DAY WITH BREAKFAST 12/01/21   Burnell Blanks, MD  Coenzyme Q10 300  MG CAPS Take 300 mg by mouth in the morning and at bedtime.    [provider]  cyclobenzaprine (FLEXERIL) 10 MG tablet Take 10 mg by mouth 2 (two) times daily.    [provider]  ezetimibe (ZETIA) 10 MG tablet Take 1 tablet (10 mg total) by mouth daily. Patient taking differently: Take 10 mg by mouth at bedtime. 10/10/21   Burnell Blanks, MD  fluticasone (FLONASE) 50 MCG/ACT nasal spray Place 1 spray into both nostrils daily as needed for allergies. 12/08/19    [provider]  isosorbide mononitrate (IMDUR) 30 MG 24 hr tablet Take 1 tablet (30 mg total) by mouth daily. Please schedule yearly appointment for future refills. 1st attempt. Thank you 08/05/20   Burnell Blanks, MD  metoprolol succinate (TOPROL-XL) 25 MG 24 hr tablet TAKE 1 TABLET (25 MG TOTAL) BY MOUTH DAILY. Patient taking differently: Take 12.5 mg by mouth in the morning and at bedtime. 09/02/21   Burnell Blanks, MD  naltrexone (DEPADE) 50 MG tablet Take 50 mg by mouth 2 (two) times daily.     [provider]  nitroGLYCERIN (NITROSTAT) 0.4 MG SL tablet Place 1 tablet (0.4 mg total) under the tongue every 5 (five) minutes as needed for chest pain. 06/14/19   Kroeger, Lorelee Cover., PA-C  Omega-3 Fatty Acids (FISH OIL) 1200 MG CAPS Take 1,200 mg by mouth daily with breakfast.    [provider]  rosuvastatin (CRESTOR) 40 MG tablet Take 1 tablet (40 mg total) by mouth daily. Patient taking differently: Take 40 mg by mouth at bedtime. 09/09/20   Burnell Blanks, MD      Allergies    Niacin and related and Niaspan [niacin er]    Review of Systems   Review of Systems  Physical Exam Updated Vital Signs BP 136/72   Pulse 67   Temp 97.6 F (36.4 C) (Oral)   Resp 15   Ht '5\' 11"'$  (1.803 m)   Wt 86.2 kg   SpO2 98%   BMI 26.50 kg/m  Physical Exam Vitals and nursing note reviewed.  Constitutional:      General: He is not in acute distress.    Appearance: He is well-developed.  HENT:     Head: Normocephalic and atraumatic.     Right Ear: External ear normal.     Left Ear: External ear normal.     Nose: Nose normal.  Eyes:     Extraocular Movements: Extraocular movements intact.     Conjunctiva/sclera: Conjunctivae normal.     Pupils: Pupils are equal, round, and reactive to light.  Cardiovascular:     Rate and Rhythm: Normal rate and regular rhythm.     Heart sounds: Normal heart sounds.     Comments: Scar from CABG.  Radial pulses 2+  bilaterally Pulmonary:     Effort: Pulmonary effort is normal. No respiratory distress.     Breath sounds: Normal breath sounds.  Musculoskeletal:     Cervical back: Normal range of motion and neck supple.     Right lower leg: No edema.     Left lower leg: No edema.     Comments: No tenderness to palpation of right shoulder.  Full range of motion of right shoulder.  No joint effusion palpated.  No redness or warmth.  Skin:    General: Skin is warm and dry.  Neurological:     Mental Status: He is alert. Mental status is at baseline.  Psychiatric:  Mood and Affect: Mood normal.        Behavior: Behavior normal.     ED Results / Procedures / Treatments   Labs (all labs ordered are listed, but only abnormal results are displayed) Labs Reviewed  BASIC METABOLIC PANEL - Abnormal; Notable for the following components:      Result Value   Sodium 134 (*)    Glucose, Bld 107 (*)    Creatinine, Ser 1.32 (*)    Calcium 8.6 (*)    GFR, Estimated 56 (*)    All other components within normal limits  CBC - Abnormal; Notable for the following components:   RBC 4.07 (*)    Hemoglobin 12.3 (*)    HCT 37.2 (*)    All other components within normal limits  TROPONIN I (HIGH SENSITIVITY)  TROPONIN I (HIGH SENSITIVITY)    EKG EKG Interpretation  Date/Time:  Sunday March 15 2022 16:53:49 EST Ventricular Rate:  70 PR Interval:  150 QRS Duration: 99 QT Interval:  400 QTC Calculation: 432 R Axis:   50 Text Interpretation: Sinus rhythm Abnormal R-wave progression, early transition No significant change since last tracing Confirmed by Margaretmary Eddy [id number]) on 03/15/2022 5:18:20 PM  Radiology DG Shoulder Right  Result Date: 03/15/2022 CLINICAL DATA:  Right shoulder pain radiating into arm. EXAM: RIGHT SHOULDER - 2+ VIEW COMPARISON:  None Available. FINDINGS: No signs of acute fracture or dislocation. Degenerative changes noted at the acromioclavicular joint. Soft tissues are  unremarkable. Signs of previous median sternotomy and CABG procedure. IMPRESSION: 1. No acute findings. 2. AC joint osteoarthritis. Electronically Signed   By: Kerby Moors M.D.   On: 03/15/2022 15:12   DG Chest 2 View  Result Date: 03/15/2022 CLINICAL DATA:  Right shoulder pain radiating to arm. EXAM: CHEST - 2 VIEW COMPARISON:  April 14, 2021 FINDINGS: Postsurgical changes from CABG. Calcific atherosclerotic disease and tortuosity of the aorta. Cardiomediastinal silhouette is normal. Mediastinal contours appear intact. There is no evidence of focal airspace consolidation, pleural effusion or pneumothorax. Moderate to severe emphysematous changes. Osseous structures are without acute abnormality. Soft tissues are grossly normal. IMPRESSION: Moderate to severe emphysematous changes. No acute findings. Electronically Signed   By: Fidela Salisbury M.D.   On: 03/15/2022 15:11    Procedures Procedures   Medications Ordered in ED Medications  aspirin chewable tablet 162 mg (162 mg Oral Patient Refused/Not Given 03/15/22 1515)    ED Course/ Medical Decision Making/ A&P Clinical Course as of 03/15/22 2158  Nancy Fetter Mar 15, 2022  1558 Per Dr Acie Fredrickson from cardiology, feels that if repeat troponin and EKG are unremarkable that he can go home.  Says he can try nitro at home. Feel that he can follow-up with Dr Julianne Handler as an outpatient.  Also has an appointment tomorrow with cardiothoracic surgery. [RP]    Clinical Course User Index [RP] Fransico Meadow, MD                             Medical Decision Making Amount and/or Complexity of Data Reviewed Labs: ordered. Radiology: ordered.  Risk OTC drugs.   Naszir Cott Puleo is a 77 y.o. male with comorbidities that complicate the patient evaluation including CAD status post CABG, NHL, ischemic cardiomyopathy, and hyperlipidemia who presents to the emergency department with right shoulder pain.    Initial Ddx:  MI, shoulder injury,  radiculopathy  MDM:  With patient's extensive cardiac history and atraumatic initiation of  the pain without any significant worsening of pain with ranging his shoulder are concerned that could potentially have cardiac cause of his shoulder pain.  No obvious shoulder injury but will obtain x-rays.  No neck pain that was just radiculopathy.  Plan:  Labs Troponin Chest x-ray Shoulder x-ray Aspirin  ED Summary/Re-evaluation:  Patient observed in the emergency department his right arm and shoulder pain resolved.  Serial troponins EKGs were reassuring.  Discussed with cardiology who reviewed his most recent heart cath and feels that he can follow-up with them as an outpatient.  Does have a cardiothoracic surgery appointment tomorrow that he will go to as well.  Referral to cardiology placed.  Return precautions discussed prior to discharge.  This patient presents to the ED for concern of complaints listed in HPI, this involves an extensive number of treatment options, and is a complaint that carries with it a high risk of complications and morbidity. Disposition including potential need for admission considered.   Dispo: DC Home. Return precautions discussed including, but not limited to, those listed in the AVS. Allowed pt time to ask questions which were answered fully prior to dc.  Additional history obtained from spouse Records reviewed Outpatient Clinic Notes The following labs were independently interpreted: Serial Troponins and show no acute abnormality I independently reviewed the following imaging with scope of interpretation limited to determining acute life threatening conditions related to emergency care: Chest x-ray and agree with the radiologist interpretation with the following exceptions: none I personally reviewed and interpreted cardiac monitoring: normal sinus rhythm  I personally reviewed and interpreted the pt's EKG: see above for interpretation  I have reviewed the patients  home medications and made adjustments as needed Consults: Cardiology Social Determinants of health:  Elderly  Final Clinical Impression(s) / ED Diagnoses Final diagnoses:  Acute pain of right shoulder  History of CAD (coronary artery disease)    Rx / DC Orders ED Discharge Orders          Ordered    Ambulatory referral to Cardiology        03/15/22 1604              Fransico Meadow, MD 03/15/22 2158

## 2022-03-15 NOTE — ED Notes (Signed)
Patient transported to X-ray 

## 2022-03-15 NOTE — Discharge Instructions (Signed)
You were seen for your right shoulder pain in the emergency department since there was some concern that this could be coming from your heart.  At home, please continue to take your medications as well as nitroglycerin as needed for any pain that you may have.  You may also take Tylenol and use over-the-counter lidocaine patches for your shoulder.  Follow-up with your primary doctor in 2-3 days regarding your visit.  Follow-up with the cardiothoracic surgeons tomorrow as scheduled.  A referral to cardiology was placed so Dr. Camillia Herter group will be calling you regarding an upcoming appointment.  Return immediately to the emergency department if you experience any of the following: Worsening shoulder pain or chest pain, shortness of breath, vomiting, sweating, or any other concerning symptoms.    Thank you for visiting our Emergency Department. It was a pleasure taking care of you today.

## 2022-03-16 ENCOUNTER — Institutional Professional Consult (permissible substitution) (INDEPENDENT_AMBULATORY_CARE_PROVIDER_SITE_OTHER): Payer: PPO | Admitting: Thoracic Surgery (Cardiothoracic Vascular Surgery)

## 2022-03-16 ENCOUNTER — Encounter: Payer: Self-pay | Admitting: Thoracic Surgery (Cardiothoracic Vascular Surgery)

## 2022-03-16 VITALS — BP 119/55 | HR 54 | Resp 20 | Ht 71.0 in | Wt 190.0 lb

## 2022-03-16 DIAGNOSIS — I25708 Atherosclerosis of coronary artery bypass graft(s), unspecified, with other forms of angina pectoris: Secondary | ICD-10-CM | POA: Diagnosis not present

## 2022-03-16 DIAGNOSIS — Z951 Presence of aortocoronary bypass graft: Secondary | ICD-10-CM

## 2022-03-16 DIAGNOSIS — I2510 Atherosclerotic heart disease of native coronary artery without angina pectoris: Secondary | ICD-10-CM

## 2022-03-16 MED ORDER — NITROGLYCERIN 0.4 MG SL SUBL: 0.4000 mg | SUBLINGUAL_TABLET | SUBLINGUAL | 3 refills | Status: AC | PRN

## 2022-03-16 NOTE — Progress Notes (Signed)
PCP is Donnajean Lopes, MD Referring Provider is Burnell Blanks*  Chief Complaint  Patient presents with   Coronary Artery Disease    Surgical consult, Cardiac Cath 02/26/22/ ECHO 07/16/21/ PFT's 08/27/21/ HX of CABG 06/2005    HPI: Joseph Soto sent for consultation for possible redo coronary bypass grafting.  Joseph Soto is a 77 year old Soto with known history of coronary disease status post emergency coronary bypass grafting x 4 in 2007 (operative note currently unavailable).  Past medical history also significant for hypertension, hyperlipidemia, leiomyosarcoma of the chest wall, reflux, fibromyalgia, and non-Hodgkin's lymphoma.  He has recently been experiencing dyspnea with exertion.  He had a workup including pulmonary evaluation and cardiology evaluation.  The underlying cause of his dyspnea was mysterious.  He does say that that has actually improved significantly over the past few weeks.  Yesterday he had an episode of right shoulder pain radiating to the right elbow.  It was not preceded by any trauma.  It resolved about 45 minutes after taking a nitroglycerin.  He presented to the emergency room.  EKG was normal and there was no evidence of MI by enzymes.  That was the only episode of pain that he has had recently.  Remains fairly active.  Has been limited somewhat by his dyspnea recently.  Can currently walk up a flight of stairs and only be mildly short of breath.  Past Medical History:  Diagnosis Date   Allergic rhinitis    grass, dust   Buttock pain    CAD (coronary artery disease)    Last cath 2010 per Dr. Doreatha Lew with diffuse multivessel disease, small caliber vessels not felt to be suitable for PCI   Chronic left hip pain    Colon polyps    pt says a colonoscopy did not confirm this, said there was nothing there   Diverticulosis of sigmoid colon 2010   colonoscopy - [facility]   Dizziness    1 episode    Fibromyalgia    GERD (gastroesophageal reflux  disease)    Head pain    "not chronic; I have myofasial tightening in my head that causes pain in my skull" (06/17/2016)   HLD (hyperlipidemia)    Hypertension    Leiomyosarcoma of chest wall ([place]) 11/2015   surgical excision alone recommended (per notes from Dr. Shon Baton office)   Myeloma [facility])    "found it in my chest when they did the excision"   Myocardial infarction ([place]) 06/2005   Non Hodgkin's lymphoma ([place]) dx'd 06/2005    Past Surgical History:  Procedure Laterality Date   BALLOON SINUPLASTY  2014   CARDIAC CATHETERIZATION  ~ 2008   "Dr. Doreatha Lew"   CATARACT EXTRACTION W/ INTRAOCULAR LENS IMPLANT Right    colonscopy  2010   CORONARY ANGIOPLASTY WITH STENT PLACEMENT  06/17/2016   CORONARY ARTERY BYPASS GRAFT  5/07   x5. LV dysfunction.    CORONARY BALLOON ANGIOPLASTY N/A 06/14/2019   Procedure: CORONARY BALLOON ANGIOPLASTY;  Surgeon: Burnell Blanks, MD;  Location: [place] [facility];  Service: Cardiovascular;  Laterality: N/A;   CORONARY STENT INTERVENTION N/A 06/17/2016   Procedure: Coronary Stent Intervention;  Surgeon: Burnell Blanks, MD;  Location: [place] [facility];  Service: Cardiovascular;  Laterality: N/A;   epidural steroid injections     LAPAROSCOPIC CHOLECYSTECTOMY  1996   LEFT HEART CATH AND CORS/GRAFTS ANGIOGRAPHY N/A 06/17/2016   Procedure: Left Heart Cath and Cors/Grafts Angiography;  Surgeon: Burnell Blanks, MD;  Location: [place] [facility];  Service: Cardiovascular;  Laterality: N/A;   LEFT HEART CATH AND CORS/GRAFTS ANGIOGRAPHY N/A 06/14/2019   Procedure: LEFT HEART CATH AND CORS/GRAFTS ANGIOGRAPHY;  Surgeon: Burnell Blanks, MD;  Location: [place] [facility];  Service: Cardiovascular;  Laterality: N/A;   LEFT HEART CATH AND CORS/GRAFTS ANGIOGRAPHY N/A 02/26/2022   Procedure: LEFT HEART CATH AND CORS/GRAFTS ANGIOGRAPHY;  Surgeon: Burnell Blanks, MD;  Location: [place] [facility];  Service: Cardiovascular;  Laterality: N/A;    MASS EXCISION N/A 11/11/2015   Procedure: EXCISION OF MID CHEST  MASS;  Surgeon: Judeth Horn, MD;  Location: [place];  Service: General;  Laterality: N/A;   MASS EXCISION N/A 12/23/2015   Procedure: REEXCISION OF CHEST WALL TUMOR SITE;  Surgeon: Judeth Horn, MD;  Location: [place];  Service: General;  Laterality: N/A;   POPLITEAL SYNOVIAL CYST EXCISION Right    TONSILLECTOMY  1952   & Adenoidectomy    Family History  Problem Relation Age of Onset   Heart disease Father    Heart attack Father    Congestive Heart Failure Father    COPD Father    Dementia Mother    Cancer Paternal Grandfather        ? type    Social History Social History   Tobacco Use   Smoking status: Former    Packs/day: 2.00    Years: 30.00    Total pack years: 60.00    Types: Cigarettes    Quit date: 05/16/2000    Years since quitting: 21.8   Smokeless tobacco: Never   Tobacco comments:    "quit 06/2005"  Vaping Use   Vaping Use: Never used  Substance Use Topics   Alcohol use: No    Alcohol/week: 0.0 standard drinks of alcohol   Drug use: No    Current Outpatient Medications  Medication Sig Dispense Refill   acetaminophen (TYLENOL) 500 MG tablet Take 500-1,000 mg by mouth every 6 (six) hours as needed (pain.).     amLODipine (NORVASC) 5 MG tablet Take 5 mg by mouth at bedtime.     aspirin EC 81 MG tablet Take 81 mg by mouth at bedtime.     BREZTRI AEROSPHERE 160-9-4.8 MCG/ACT AERO Inhale 2 puffs into the lungs in the morning and at bedtime. 10.7 g 11   cholecalciferol (VITAMIN D) 1000 units tablet Take 1,000 Units by mouth daily with breakfast.     clopidogrel (PLAVIX) 75 MG tablet TAKE 1 TABLET BY MOUTH EVERY DAY WITH BREAKFAST 90 tablet 3   Coenzyme Q10 300 MG CAPS Take 300 mg by mouth in the morning and at bedtime.     cyclobenzaprine (FLEXERIL) 10 MG tablet Take 10 mg by mouth 2 (two) times daily.     ezetimibe (ZETIA) 10 MG tablet Take 1 tablet (10 mg  total) by mouth daily. (Patient taking differently: Take 10 mg by mouth at bedtime.) 90 tablet 1   fluticasone (FLONASE) 50 MCG/ACT nasal spray Place 1 spray into both nostrils daily as needed for allergies.     isosorbide mononitrate (IMDUR) 30 MG 24 hr tablet Take 1 tablet (30 mg total) by mouth daily. Please schedule yearly appointment for future refills. 1st attempt. Thank you 45 tablet 0   metoprolol succinate (TOPROL-XL) 25 MG 24 hr tablet TAKE 1 TABLET (25 MG TOTAL) BY MOUTH DAILY. (Patient taking differently: Take 12.5 mg by mouth in the morning and at bedtime.) 90 tablet 3  naltrexone (DEPADE) 50 MG tablet Take 50 mg by mouth 2 (two) times daily.      Omega-3 Fatty Acids (FISH OIL) 1200 MG CAPS Take 1,200 mg by mouth daily with breakfast.     rosuvastatin (CRESTOR) 40 MG tablet Take 1 tablet (40 mg total) by mouth daily. (Patient taking differently: Take 40 mg by mouth at bedtime.) 90 tablet 0   nitroGLYCERIN (NITROSTAT) 0.4 MG SL tablet Place 1 tablet (0.4 mg total) under the tongue every 5 (five) minutes as needed for chest pain. 25 tablet 3   No current facility-administered medications for this visit.    Allergies  Allergen Reactions   Niacin And Related Nausea And Vomiting   Niaspan [Niacin Er] Nausea And Vomiting and Other (See Comments)    Stomach intolerance   Ramipril     Other Reaction(s): cough    Review of Systems  Constitutional:  Positive for activity change. Negative for unexpected weight change.  HENT:  Positive for hearing loss.   Eyes:  Negative for visual disturbance.  Respiratory:  Positive for shortness of breath. Negative for wheezing.   Cardiovascular:  Positive for chest pain (Right shoulder pain yesterday) and leg swelling. Negative for palpitations.  Genitourinary:  Negative for difficulty urinating and dysuria.  Musculoskeletal:  Negative for arthralgias.  Neurological:  Negative for speech difficulty and weakness.  Hematological:  Negative for  adenopathy. Bruises/bleeds easily.    BP (!) 119/Joseph   Pulse (!) 54   Resp 20   Ht '5\' 11"'$  (1.803 m)   Wt 190 lb (86.2 kg)   SpO2 97% Comment: RA  BMI 26.50 kg/m  Physical Exam Vitals reviewed.  Constitutional:      General: He is not in acute distress.    Appearance: Normal appearance.  HENT:     Head: Normocephalic and atraumatic.  Eyes:     General: No scleral icterus.    Extraocular Movements: Extraocular movements intact.  Cardiovascular:     Rate and Rhythm: Normal rate and regular rhythm.     Heart sounds: Normal heart sounds. No murmur heard.    No friction rub. No gallop.  Pulmonary:     Effort: No respiratory distress.     Breath sounds: Normal breath sounds. No wheezing or rales.  Abdominal:     General: There is no distension.     Palpations: Abdomen is soft.  Musculoskeletal:        General: No swelling.     Comments: Well-healed midline chest incision, sternum stable  Skin:    General: Skin is warm and dry.  Neurological:     General: No focal deficit present.     Mental Status: He is alert and oriented to person, place, and time.     Cranial Nerves: No cranial nerve deficit.     Motor: No weakness.    Diagnostic Tests: Conclusion      Ost LM to LM lesion is 40% stenosed.   LM lesion is 40% stenosed.   Ost LAD to Prox LAD lesion is 90% stenosed.   Mid LAD lesion is 100% stenosed.   Prox RCA to Mid RCA lesion is 40% stenosed.   Dist RCA-2 lesion is 50% stenosed.   Dist RCA-1 lesion is 50% stenosed.   Origin lesion is 100% stenosed.   Origin to Prox Graft lesion before Dist RCA  is 100% stenosed.   Origin to Prox Graft lesion is 100% stenosed.   Ost RPDA to RPDA lesion is 70% stenosed.  RPDA lesion is 70% stenosed.   RPAV lesion is 50% stenosed.   1st Diag lesion is 95% stenosed.   Ost Cx to Prox Cx lesion is 90% stenosed.   Mid LM to Dist LM lesion is 70% stenosed.   Non-stenotic Prox RCA lesion was previously treated.   SVG.   SVG.    SVG.   LIMA graft was visualized by angiography.   1. Triple vessel CAD s/p 4V CABG with 1/4 patent bypass grafts 2. Chronic mid LAD occlusion with patent LIMA graft to the LAD. There is a moderate caliber Diagonal branch that fills antegrade. The Diagonal branch has severe ostial stenosis.  3. Severe proximal Circumflex stenosis in a moderate caliber vessel.  The vein graft to the intermediate branch is known to be occluded by the prior cath. I attempted to work on this vessel in 2021 but due to angulation and calcification in the distal left main and Circumflex, I could not adequately treat this vessel. PCI is not an option in this segment.  4. Patent proximal RCA stent. Moderate mid RCA stenosis.  Severe stenosis in the ostium of the PDA. The sequential vein graft to the PDA and PLA is known to be occluded by prior cath.  5. Moderate calcified distal left main stenosis   Recommendations: Will continue medical management of CAD. His left main disease and proximal Circumflex disease is not amenable to percutaneous therapy. These vessels are all potential targets for bypass. The Diagonal and right sided PDA are also potential targets for bypass. I will continue medical therapy for now. He wishes to be referred to CT surgery to discuss re-do bypass. I will place this referral. He is a very functional 77 yo Soto with some degree of COPD and Non Hodgkins Lymphoma in remission.     I personally reviewed the catheterization images.  There is left main and three-vessel disease.  Patent mammary to LAD.  Impression: Joseph Soto is a Joseph Soto with a past medical history also significant for CAD, CABG, PCI, hypertension, hyperlipidemia, leiomyosarcoma of the chest wall, reflux, fibromyalgia, and non-Hodgkin's lymphoma (in remission).  He underwent emergency coronary bypass grafting in 2007.  He has had catheterization since then with known occlusion of his vein grafts.  His mammary to LAD graft  has remained patent over time.  Recently he presented with exertional dyspnea.  Pulmonary workup was unrevealing.  That led to a cardiac workup which showed some progression of his native coronary disease.  Interestingly, his dyspnea with exertion has improved recently.  However, he had an episode very concerning for unstable angina yesterday with right shoulder pain radiating to his right elbow at rest.  Was relieved about 45 minutes after taking a nitroglycerin.  Still unclear to me how much of his dyspnea might be related to his coronary disease.  Episode yesterday was concerning for unstable angina but a little unusual time course to resolution after taking nitroglycerin.  I discussed the possibility of redo coronary bypass grafting with Joseph Soto.  They understand there is really not a significant survival advantage to redo bypass surgery in the setting of a patent left mammary to LAD graft.  However it is possible he could experience significant relief of symptoms.  It is just a little uncertain to what degree his CAD is contributing to his symptoms.  I described the procedure to him.  He understands he would do this through a redo sternotomy.  We would use his right mammary artery  and saphenous vein.  He understands the general nature of the procedure including the need for general anesthesia, the incisions to be used, the use of cardiopulmonary bypass, the use of drainage tubes and temporary pacemaker wires postoperatively.  We discussed the expected hospital stay and overall recovery.  I informed him of the indications, risks, benefits, and alternatives.  He understands the risks include, but not limited to death, MI, DVT, PE, bleeding, possible need for transfusion, infection, injury to left mammary artery, cardiac arrhythmias, respiratory or renal failure, as well as possibility of other unforeseeable complications.  He understands in general the risks of redo bypass grafting are  significantly higher than initial surgery.  He understands that the results are less certain in terms of symptom relief and survival benefit.  He wishes to think over his options before making a decision.  If he decides to proceed with redo CABG we can plan to do it next Wednesday, 03/25/2022.  Plan: Patient will call if he would like to proceed with redo CABG  I spent over 60 minutes in review of records, images, and in consultation with Joseph Soto today. Melrose Nakayama, MD [facility] [phone]

## 2022-03-17 ENCOUNTER — Encounter: Payer: Self-pay | Admitting: *Deleted

## 2022-03-18 DIAGNOSIS — I2510 Atherosclerotic heart disease of native coronary artery without angina pectoris: Secondary | ICD-10-CM | POA: Diagnosis not present

## 2022-03-18 DIAGNOSIS — R82998 Other abnormal findings in urine: Secondary | ICD-10-CM | POA: Diagnosis not present

## 2022-03-18 DIAGNOSIS — N1831 Chronic kidney disease, stage 3a: Secondary | ICD-10-CM | POA: Diagnosis not present

## 2022-03-21 ENCOUNTER — Encounter: Payer: Self-pay | Admitting: Cardiovascular Disease

## 2022-03-27 ENCOUNTER — Inpatient Hospital Stay: Payer: PPO | Admitting: Oncology

## 2022-04-06 DIAGNOSIS — R1032 Left lower quadrant pain: Secondary | ICD-10-CM | POA: Diagnosis not present

## 2022-04-06 DIAGNOSIS — K4090 Unilateral inguinal hernia, without obstruction or gangrene, not specified as recurrent: Secondary | ICD-10-CM | POA: Diagnosis not present

## 2022-04-07 DIAGNOSIS — J449 Chronic obstructive pulmonary disease, unspecified: Secondary | ICD-10-CM | POA: Diagnosis not present

## 2022-04-22 ENCOUNTER — Telehealth: Payer: Self-pay | Admitting: *Deleted

## 2022-04-22 DIAGNOSIS — K429 Umbilical hernia without obstruction or gangrene: Secondary | ICD-10-CM | POA: Diagnosis not present

## 2022-04-22 DIAGNOSIS — K4020 Bilateral inguinal hernia, without obstruction or gangrene, not specified as recurrent: Secondary | ICD-10-CM | POA: Diagnosis not present

## 2022-04-22 NOTE — Telephone Encounter (Signed)
   Pre-operative Risk Assessment    Patient Name: Joseph Soto  DOB: 10-13-1945 MRN: [id number]      Request for Surgical Clearance    Procedure:   HERNIA SURGERY  Date of Surgery:  Clearance TBD                                 Surgeon:  Louanna Raw, MD Surgeon's Group or Practice Name:  [facility] Phone number:  [phone] Fax number:  [phone]   Type of Clearance Requested:   - Medical  - Pharmacy:  Hold Clopidogrel (Plavix) NOT INDICATED HOW LONG   Type of Anesthesia:  General    Additional requests/questions:    Astrid Divine   04/22/2022, 2:25 PM

## 2022-04-22 NOTE — Telephone Encounter (Signed)
Pt agreeable to in office appt with Ambrose Pancoast, NP same day Dr. Angelena Form is in the office @ 1:55 05/01/22. I will update all parties involved.

## 2022-04-22 NOTE — Telephone Encounter (Signed)
   Name: Joseph Soto  DOB: 1945/11/23  MRN: [id number]  Primary Cardiologist: Lauree Chandler, MD  Chart reviewed as part of pre-operative protocol coverage. Because of Joseph Soto's past medical history and time since last visit, he will require a follow-up in-office visit in order to better assess preoperative cardiovascular risk.  Pre-op covering staff: - Please schedule appointment and call patient to inform them. If patient already had an upcoming appointment within acceptable timeframe, please add "pre-op clearance" to the appointment notes so provider is aware. - Please contact requesting surgeon's office via preferred method (i.e, phone, fax) to inform them of need for appointment prior to surgery.    Mable Fill, Marissa Nestle, NP  04/22/2022, 2:33 PM

## 2022-04-28 ENCOUNTER — Other Ambulatory Visit: Payer: Self-pay

## 2022-04-28 MED ORDER — EZETIMIBE 10 MG PO TABS: 10.0000 mg | ORAL_TABLET | Freq: Every day | ORAL | 3 refills | Status: DC

## 2022-04-30 NOTE — Progress Notes (Signed)
Office Visit    Patient Name: Joseph Soto Date of Encounter: 04/30/2022  Primary Care Provider:  Donnajean Lopes, MD Primary Cardiologist:  Lauree Chandler, MD Primary Electrophysiologist: None  Chief Complaint    Joseph Soto is a 77 y.o. male with PMH of CAD s/p CABG times 4 in 2007, [place] with DES to proximal RCA in 2018, repeat Lucky 2021 with severe proximal circumflex stenosis with no target for PCI, non-Hodgkin's lymphoma, GERD, HTN, HLD, myeloma who presents today for preoperative clearance of upcoming hernia surgery.  Past Medical History    Past Medical History:  Diagnosis Date   Allergic rhinitis    grass, dust   Buttock pain    CAD (coronary artery disease)    Last cath 2010 per Dr. 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 Shon Baton office)   Myeloma [facility])    "found it in my chest when they did the excision"   Myocardial infarction ([place]) 06/2005   Non Hodgkin's lymphoma ([place]) dx'd 06/2005   Past Surgical History:  Procedure Laterality Date   BALLOON SINUPLASTY  2014   CARDIAC CATHETERIZATION  ~ 2008   "Dr. Doreatha Lew"   CATARACT EXTRACTION W/ INTRAOCULAR LENS IMPLANT Right    colonscopy  2010   CORONARY ANGIOPLASTY WITH STENT PLACEMENT  06/17/2016   CORONARY ARTERY BYPASS GRAFT  5/07   x5. LV dysfunction.    CORONARY BALLOON ANGIOPLASTY N/A 06/14/2019   Procedure: CORONARY  BALLOON ANGIOPLASTY;  Surgeon: Burnell Blanks, MD;  Location: [place] [facility];  Service: Cardiovascular;  Laterality: N/A;   CORONARY STENT INTERVENTION N/A 06/17/2016   Procedure: Coronary Stent Intervention;  Surgeon: Burnell Blanks, MD;  Location: [place] [facility];  Service: Cardiovascular;  Laterality: N/A;   epidural steroid injections     LAPAROSCOPIC CHOLECYSTECTOMY  1996   LEFT HEART CATH AND CORS/GRAFTS ANGIOGRAPHY N/A 06/17/2016   Procedure: Left Heart Cath and Cors/Grafts Angiography;  Surgeon: Burnell Blanks, MD;  Location: [place] [facility];  Service: Cardiovascular;  Laterality: N/A;   LEFT HEART CATH AND CORS/GRAFTS ANGIOGRAPHY N/A 06/14/2019   Procedure: LEFT HEART CATH AND CORS/GRAFTS ANGIOGRAPHY;  Surgeon: Burnell Blanks, MD;  Location: [place] [facility];  Service: Cardiovascular;  Laterality: N/A;   LEFT HEART CATH AND CORS/GRAFTS ANGIOGRAPHY N/A 02/26/2022   Procedure: LEFT HEART CATH AND CORS/GRAFTS ANGIOGRAPHY;  Surgeon: Burnell Blanks, MD;  Location: [place] [facility];  Service: Cardiovascular;  Laterality: N/A;   MASS EXCISION N/A 11/11/2015   Procedure: EXCISION OF MID CHEST  MASS;  Surgeon: Judeth Horn, MD;  Location: [place];  Service: General;  Laterality: N/A;   MASS EXCISION N/A 12/23/2015   Procedure: REEXCISION OF CHEST WALL TUMOR SITE;  Surgeon: Judeth Horn, MD;  Location: [facility]  [place];  Service: General;  Laterality: N/A;   POPLITEAL SYNOVIAL CYST EXCISION Right    TONSILLECTOMY  1952   & Adenoidectomy    Allergies  Allergies  Allergen Reactions   Niacin And Related Nausea And Vomiting   Niaspan [Niacin Er] Nausea And Vomiting and Other (See Comments)    Stomach intolerance   Ramipril     Other Reaction(s): cough    History of Present Illness    Joseph Soto  is a 77 year old male with the above mention past medical history who presents today for preoperative clearance of  upcoming hernia surgery.  Joseph Soto has been followed by Dr. Angelena Form since 2010.  He has an an extensive cardiac history with bypass in 2007 by Dr. Roxan Hockey.  He has also undergone numerous PCI procedures with DES placed to RCA in 2018, repeat cath in 06/2019 with severe proximal circumflex stenosis with no targets for PCI and treated with balloon angioplasty with inability to expand alone due to torturous calcification and lesion.  Medical management was recommended and treated.  He had 2D echo completed 07/2021 with EF=55-60%. No valve disease.  He was seen by Dr. Angelena Form last on 02/16/2022 for follow-up visit.  He continued to have complaints of dyspnea with use of supplemental O2.  It was felt that dyspnea is his possible anginal equivalent and the decision was made to reattempt PCI with the plan to refer to CVTS for consideration of redo CABG if unsuccessful.  Left heart catheter was completed with severe stenosis and inability to complete intervention.  He was referred to CVTS for consideration of redo CABG.  He was consulted by Dr. Roxan Hockey on 03/16/2022 however patient was informed that there was not a significant survival advantage to redo bypass.  He decided to think over his options before making a decision.  Joseph Soto presents today for preoperative clearance for upcoming hernia surgery.  Since last being seen in the office patient reports that he is doing much better and denies any chest discomfort or shortness of breath which is his previous anginal equivalent.  He reports that he believes that the shortness of breath was related to possible upper respiratory infection and pulmonary concerns.  His blood pressure today is 130/62 and heart rate is stable at 79 bpm.  He is tolerating his current medications without any adverse reactions.  He is able to achieve greater than 4 METS of activity without any cardiac symptoms or complaints.  Patient denies chest pain, palpitations, dyspnea, PND,  orthopnea, nausea, vomiting, dizziness, syncope, edema, weight gain, or early satiety.  Home Medications    Current Outpatient Medications  Medication Sig Dispense Refill   acetaminophen (TYLENOL) 500 MG tablet Take 500-1,000 mg by mouth every 6 (six) hours as needed (pain.).     amLODipine (NORVASC) 5 MG tablet Take 5 mg by mouth at bedtime.     aspirin EC 81 MG tablet Take 81 mg by mouth at bedtime.     BREZTRI AEROSPHERE 160-9-4.8 MCG/ACT AERO Inhale 2 puffs into the lungs in the morning and at bedtime. 10.7 g 11   cholecalciferol (VITAMIN D) 1000 units tablet Take 1,000 Units by mouth daily with breakfast.     clopidogrel (PLAVIX) 75 MG tablet TAKE 1 TABLET BY MOUTH EVERY DAY WITH BREAKFAST 90 tablet 3   Coenzyme Q10 300 MG CAPS Take 300 mg by mouth in the morning and at bedtime.     cyclobenzaprine (FLEXERIL) 10 MG tablet Take 10  mg by mouth 2 (two) times daily.     ezetimibe (ZETIA) 10 MG tablet Take 1 tablet (10 mg total) by mouth daily. 90 tablet 3   fluticasone (FLONASE) 50 MCG/ACT nasal spray Place 1 spray into both nostrils daily as needed for allergies.     isosorbide mononitrate (IMDUR) 30 MG 24 hr tablet Take 1 tablet (30 mg total) by mouth daily. Please schedule yearly appointment for future refills. 1st attempt. Thank you 45 tablet 0   metoprolol succinate (TOPROL-XL) 25 MG 24 hr tablet TAKE 1 TABLET (25 MG TOTAL) BY MOUTH DAILY. (Patient taking differently: Take 12.5 mg by mouth in the morning and at bedtime.) 90 tablet 3   naltrexone (DEPADE) 50 MG tablet Take 50 mg by mouth 2 (two) times daily.      nitroGLYCERIN (NITROSTAT) 0.4 MG SL tablet Place 1 tablet (0.4 mg total) under the tongue every 5 (five) minutes as needed for chest pain. 25 tablet 3   Omega-3 Fatty Acids (FISH OIL) 1200 MG CAPS Take 1,200 mg by mouth daily with breakfast.     rosuvastatin (CRESTOR) 40 MG tablet Take 1 tablet (40 mg total) by mouth daily. (Patient taking differently: Take 40 mg by mouth at  bedtime.) 90 tablet 0   No current facility-administered medications for this visit.     Review of Systems  Please see the history of present illness.     All other systems reviewed and are otherwise negative except as noted above.  Physical Exam    Wt Readings from Last 3 Encounters:  03/16/22 190 lb (86.2 kg)  03/15/22 190 lb (86.2 kg)  03/02/22 200 lb 3.2 oz (90.8 kg)   [id number] were no vitals filed for this visit.,There is no height or weight on file to calculate BMI.  Constitutional:      Appearance: Healthy appearance. Not in distress.  Neck:     Vascular: JVD normal.  Pulmonary:     Effort: Pulmonary effort is normal.     Breath sounds: No wheezing. No rales. Diminished in the bases Cardiovascular:     Normal rate. Regular rhythm. Normal S1. Normal S2.      Murmurs: There is no murmur.  Edema:    Peripheral edema absent.  Abdominal:     Palpations: Abdomen is soft non tender. There is no hepatomegaly.  Skin:    General: Skin is warm and dry.  Neurological:     General: No focal deficit present.     Mental Status: Alert and oriented to person, place and time.     Cranial Nerves: Cranial nerves are intact.  EKG/LABS/ Recent Cardiac Studies    ECG personally reviewed by me today -sinus rhythm with a rate of 79 bpm no acute changes consistent with previous EKG QTc of 433 ms     Lab Results  Component Value Date   WBC 9.1 03/15/2022   HGB 12.3 (L) 03/15/2022   HCT 37.2 (L) 03/15/2022   MCV 91.4 03/15/2022   PLT 242 03/15/2022   Lab Results  Component Value Date   CREATININE 1.32 (H) 03/15/2022   BUN 23 03/15/2022   NA 134 (L) 03/15/2022   K 4.5 03/15/2022   CL 102 03/15/2022   CO2 23 03/15/2022   Lab Results  Component Value Date   ALT 32 01/26/2019   AST 40 (H) 01/26/2019   ALKPHOS 60 01/26/2019   BILITOT 0.5 01/26/2019   Lab Results  Component Value Date   CHOL 116 01/24/2018  HDL 46 01/24/2018   LDLCALC 52 01/24/2018   LDLDIRECT 198.4  08/21/2010   TRIG 90 01/24/2018   CHOLHDL 2.5 01/24/2018    No results found for: "HGBA1C"  Cardiac Studies & Procedures   CARDIAC CATHETERIZATION  CARDIAC CATHETERIZATION 02/26/2022  Narrative   Ost LM to LM lesion is 40% stenosed.   LM lesion is 40% stenosed.   Ost LAD to Prox LAD lesion is 90% stenosed.   Mid LAD lesion is 100% stenosed.   Prox RCA to Mid RCA lesion is 40% stenosed.   Dist RCA-2 lesion is 50% stenosed.   Dist RCA-1 lesion is 50% stenosed.   Origin lesion is 100% stenosed.   Origin to Prox Graft lesion before Dist RCA  is 100% stenosed.   Origin to Prox Graft lesion is 100% stenosed.   Ost RPDA to RPDA lesion is 70% stenosed.   RPDA lesion is 70% stenosed.   RPAV lesion is 50% stenosed.   1st Diag lesion is 95% stenosed.   Ost Cx to Prox Cx lesion is 90% stenosed.   Mid LM to Dist LM lesion is 70% stenosed.   Non-stenotic Prox RCA lesion was previously treated.   SVG.   SVG.   SVG.   LIMA graft was visualized by angiography.  1. Triple vessel CAD s/p 4V CABG with 1/4 patent bypass grafts 2. Chronic mid LAD occlusion with patent LIMA graft to the LAD. There is a moderate caliber Diagonal branch that fills antegrade. The Diagonal branch has severe ostial stenosis. 3. Severe proximal Circumflex stenosis in a moderate caliber vessel.  The vein graft to the intermediate branch is known to be occluded by the prior cath. I attempted to work on this vessel in 2021 but due to angulation and calcification in the distal left main and Circumflex, I could not adequately treat this vessel. PCI is not an option in this segment. 4. Patent proximal RCA stent. Moderate mid RCA stenosis.  Severe stenosis in the ostium of the PDA. The sequential vein graft to the PDA and PLA is known to be occluded by prior cath. 5. Moderate calcified distal left main stenosis  Recommendations: Will continue medical management of CAD. His left main disease and proximal Circumflex disease is not  amenable to percutaneous therapy. These vessels are all potential targets for bypass. The Diagonal and right sided PDA are also potential targets for bypass. I will continue medical therapy for now. He wishes to be referred to CT surgery to discuss re-do bypass. I will place this referral. He is a very functional 77 yo male with some degree of COPD and Non Hodgkins Lymphoma in remission.  Findings Coronary Findings Diagnostic  Dominance: Right  Left Main Ost LM to LM lesion is 40% stenosed. The lesion is eccentric. The lesion is calcified. LM lesion is 40% stenosed. Mid LM to Dist LM lesion is 70% stenosed. The lesion is calcified.  Left Anterior Descending Ost LAD to Prox LAD lesion is 90% stenosed. The lesion is calcified. Mid LAD lesion is 100% stenosed. The lesion is chronically occluded.  First Diagonal Branch Vessel is small in size. 1st Diag lesion is 95% stenosed.  First Septal Branch Vessel is small in size.  Second Diagonal Branch Vessel is small in size.  Second Septal Branch Vessel is small in size.  Third Diagonal Branch Vessel is small in size.  Third Septal Branch Vessel is small in size.  Ramus Intermedius Vessel is small.  Left Circumflex Vessel is moderate  in size. Ost Cx to Prox Cx lesion is 90% stenosed. The lesion is calcified. The lesion was previously treated .  First Obtuse Marginal Branch Vessel is moderate in size.  Second Obtuse Marginal Branch Vessel is small in size.  Right Coronary Artery Non-stenotic Prox RCA lesion was previously treated. Prox RCA to Mid RCA lesion is 40% stenosed. The lesion is calcified. Dist RCA-1 lesion is 50% stenosed. The lesion is calcified. Dist RCA-2 lesion is 50% stenosed. The lesion is calcified.  Right Posterior Descending Artery Ost RPDA to RPDA lesion is 70% stenosed. The lesion is calcified. RPDA lesion is 70% stenosed.  Right Posterior Atrioventricular Artery RPAV lesion is 50%  stenosed.  Saphenous Graft To Ramus SVG. Origin lesion is 100% stenosed. The lesion is chronically occluded.  Sequential Saphenous Graft To Dist RCA, RPDA SVG. Origin to Prox Graft lesion before Dist RCA  is 100% stenosed. The lesion is chronically occluded.  Saphenous Graft To 1st Mrg SVG. Origin to Prox Graft lesion is 100% stenosed. The lesion is chronically occluded.  LIMA LIMA Graft To Dist LAD LIMA graft was visualized by angiography.  Intervention  No interventions have been documented.   CARDIAC CATHETERIZATION  CARDIAC CATHETERIZATION 06/14/2019  Narrative  Ost LM to LM lesion is 40% stenosed.  LM lesion is 40% stenosed.  Ost LAD to Prox LAD lesion is 90% stenosed.  Mid LAD lesion is 100% stenosed.  Ost Cx to Prox Cx lesion is 95% stenosed.  Previously placed Prox RCA drug eluting stent is widely patent.  Balloon angioplasty was performed.  Prox RCA to Mid RCA lesion is 40% stenosed.  Dist RCA-2 lesion is 50% stenosed.  Dist RCA-1 lesion is 50% stenosed.  Ost RPDA to RPDA lesion is 70% stenosed.  LIMA and is normal in caliber.  SVG.  Origin lesion is 100% stenosed.  SVG.  Origin to Prox Graft lesion before Dist RCA is 100% stenosed.  RPDA lesion is 70% stenosed.  RPAV lesion is 50% stenosed.  The left ventricular ejection fraction is 35-45% by visual estimate.  There is mild to moderate left ventricular systolic dysfunction.  Ost LM to Mid LM lesion is 50% stenosed.  Balloon angioplasty was performed.  Post intervention, there is a 70% residual stenosis.  Origin to Prox Graft lesion is 100% stenosed.  SVG graft was not injected.  1. Triple vessel CAD s/p 4V CABG with 1/4 patent bypass grafts 2. Chronic mid LAD occlusion with patent LIMA graft to the LAD. There is a moderate caliber Diagonal branch that fills antegrade. 3. Severe proximal Circumflex stenosis in a moderate caliber vessel. This has progressed in severity since his  last cath. The vein graft to the intermediate branch is known to be occluded by the prior cath. 4. Patent proximal RCA stent. Moderate mid RCA stenosis.  Moderately severe stenosis in the ostium of the PDA. The sequential vein graft to the PDA and PLA is known to be occluded by prior cath. 5. Moderate left main stenosis 6. Mild to moderate LV systolic dysfunction 7. Balloon angioplasty of the ostial Circumflex. Stenosis taken from 95% down to 70%.  Recommendations: Will continue medical management of CAD. His left main disease and proximal Circumflex disease is not amenable to percutaneous therapy. I do not think that his distal RCA branch disease is favorable for PCI either. If he continues to have dyspnea, may need to consider re-do bypass surgery.  Findings Coronary Findings Diagnostic  Dominance: Right  Left Main Ost LM to  LM lesion is 40% stenosed. The lesion is eccentric. The lesion is calcified. Ost LM to Mid LM lesion is 50% stenosed. LM lesion is 40% stenosed.  Left Anterior Descending Ost LAD to Prox LAD lesion is 90% stenosed. The lesion is calcified. Mid LAD lesion is 100% stenosed. The lesion is chronically occluded.  First Diagonal Branch Vessel is small in size.  First Septal Branch Vessel is small in size.  Second Diagonal Branch Vessel is small in size.  Second Septal Branch Vessel is small in size.  Third Diagonal Branch Vessel is small in size.  Third Septal Branch Vessel is small in size.  Ramus Intermedius Vessel is small.  Left Circumflex Vessel is moderate in size. Ost Cx to Prox Cx lesion is 95% stenosed.  First Obtuse Marginal Branch Vessel is small in size.  Second Obtuse Marginal Branch Vessel is small in size.  Right Coronary Artery Previously placed Prox RCA drug eluting stent is widely patent. Prox RCA to Mid RCA lesion is 40% stenosed. The lesion is calcified. Dist RCA-1 lesion is 50% stenosed. The lesion is calcified. Dist RCA-2  lesion is 50% stenosed. The lesion is calcified.  Right Posterior Descending Artery Ost RPDA to RPDA lesion is 70% stenosed. The lesion is calcified. RPDA lesion is 70% stenosed.  Right Posterior Atrioventricular Artery RPAV lesion is 50% stenosed.  LIMA LIMA Graft To Mid LAD LIMA and is normal in caliber.  Saphenous Graft To Ramus SVG. Origin lesion is 100% stenosed. The lesion is chronically occluded.  Sequential Saphenous Graft To Dist RCA, RPDA SVG. Origin to Prox Graft lesion before Dist RCA  is 100% stenosed. The lesion is chronically occluded.  Saphenous Graft To 1st Mrg SVG graft was not injected. Origin to Prox Graft lesion is 100% stenosed. The lesion is chronically occluded.  Intervention  Ost Cx to Prox Cx lesion Angioplasty Balloon angioplasty was performed. Post-Intervention Lesion Assessment The intervention was successful. Pre-interventional TIMI flow is 3. Post-intervention TIMI flow is 3. No complications occurred at this lesion. There is a 70% residual stenosis post intervention.   STRESS TESTS  MYOCARDIAL PERFUSION IMAGING 10/13/2017  Narrative  Nuclear stress EF: 37%. The left ventricular ejection fraction is moderately decreased (30-44%).  Pt walked for 7:15 of a standard Bruce protocol GXT . There were no ST or T wave changes to suggest ischemia . BP response was normal  This is an intermediate risk study ( based on reduction of LV function ) There is no ischemia or evidence of infarction   ECHOCARDIOGRAM  ECHOCARDIOGRAM COMPLETE 07/16/2021  Narrative ECHOCARDIOGRAM REPORT    Patient Name:   Joseph Soto Date of Exam: 07/16/2021 Medical Rec #:  [id number]        Height:       71.0 in Accession #:    [id number]       Weight:       205.8 lb Date of Birth:  Oct 09, 1945        BSA:          2.134 m Patient Age:    34 years         BP:           116/70 mmHg Patient Gender: M                HR:           82 bpm. Exam Location:  [place]  Procedure: 2D Echo, Cardiac Doppler and Color Doppler  Indications:  I25.5 Ischemic cardiomyopathy  History:        Patient has prior history of Echocardiogram examinations, most recent 10/18/2017. CAD and Previous Myocardial Infarction, Signs/Symptoms:Chest Pain; Risk Factors:Hypertension and Dyslipidemia. Unstable angina. Dyspnea on exertion. Fibromyalgia.  Sonographer:    Diamond Nickel RCS Referring Phys: Burnell Blanks  IMPRESSIONS   1. Left ventricular ejection fraction, by estimation, is 55 to 60%. The left ventricle has normal function. The left ventricle demonstrates regional wall motion abnormalities (see scoring diagram/findings for description). Left ventricular diastolic parameters are consistent with Grade I diastolic dysfunction (impaired relaxation). 2. Right ventricular systolic function is normal. The right ventricular size is normal. Tricuspid regurgitation signal is inadequate for assessing PA pressure. 3. The mitral valve is grossly normal. No evidence of mitral valve regurgitation. No evidence of mitral stenosis. 4. The aortic valve is tricuspid. Aortic valve regurgitation is not visualized. No aortic stenosis is present. 5. The inferior vena cava is normal in size with greater than 50% respiratory variability, suggesting right atrial pressure of 3 mmHg.  Comparison(s): Changes from prior study are noted. EF has improved. Mild hypokinesis of the apical septum and apex.  FINDINGS Left Ventricle: Left ventricular ejection fraction, by estimation, is 55 to 60%. The left ventricle has normal function. The left ventricle demonstrates regional wall motion abnormalities. The left ventricular internal cavity size was normal in size. There is no left ventricular hypertrophy. Left ventricular diastolic parameters are consistent with Grade I diastolic dysfunction (impaired relaxation).   LV Wall Scoring: The apical septal segment and apex are  hypokinetic.  Right Ventricle: The right ventricular size is normal. No increase in right ventricular wall thickness. Right ventricular systolic function is normal. Tricuspid regurgitation signal is inadequate for assessing PA pressure.  Left Atrium: Left atrial size was normal in size.  Right Atrium: Right atrial size was normal in size.  Pericardium: There is no evidence of pericardial effusion.  Mitral Valve: The mitral valve is grossly normal. Mild mitral annular calcification. No evidence of mitral valve regurgitation. No evidence of mitral valve stenosis.  Tricuspid Valve: The tricuspid valve is grossly normal. Tricuspid valve regurgitation is not demonstrated. No evidence of tricuspid stenosis.  Aortic Valve: The aortic valve is tricuspid. Aortic valve regurgitation is not visualized. No aortic stenosis is present.  Pulmonic Valve: The pulmonic valve was grossly normal. Pulmonic valve regurgitation is not visualized. No evidence of pulmonic stenosis.  Aorta: The aortic root and ascending aorta are structurally normal, with no evidence of dilitation.  Venous: The inferior vena cava is normal in size with greater than 50% respiratory variability, suggesting right atrial pressure of 3 mmHg.  IAS/Shunts: The atrial septum is grossly normal.   LEFT VENTRICLE PLAX 2D LVIDd:         4.60 cm   Diastology LVIDs:         3.00 cm   LV e' medial:    7.07 cm/s LV PW:         0.80 cm   LV E/e' medial:  7.2 LV IVS:        0.90 cm   LV e' lateral:   8.16 cm/s LVOT diam:     2.10 cm   LV E/e' lateral: 6.2 LV SV:         54 LV SV Index:   25 LVOT Area:     3.46 cm   RIGHT VENTRICLE RV Basal diam:  2.50 cm RV S prime:     11.20 cm/s TAPSE (M-mode): 1.1  cm  LEFT ATRIUM             Index        RIGHT ATRIUM           Index LA diam:        3.10 cm 1.45 cm/m   RA Area:     13.20 cm LA Vol (A2C):   42.7 ml 20.01 ml/m  RA Volume:   25.90 ml  12.13 ml/m LA Vol (A4C):   33.9 ml 15.88  ml/m LA Biplane Vol: 37.9 ml 17.76 ml/m AORTIC VALVE LVOT Vmax:   91.00 cm/s LVOT Vmean:  63.300 cm/s LVOT VTI:    0.157 m  AORTA Ao Root diam: 3.70 cm Ao Asc diam:  3.60 cm  MITRAL VALVE MV Area (PHT): 2.13 cm    SHUNTS MV Decel Time: 356 msec    Systemic VTI:  0.16 m MV E velocity: 50.80 cm/s  Systemic Diam: 2.10 cm MV A velocity: 96.90 cm/s MV E/A ratio:  0.52  Eleonore Chiquito MD Electronically signed by Eleonore Chiquito MD Signature Date/Time: 07/16/2021/11:22:37 AM    Final             Assessment & Plan    Preoperative clearance:  Mr. Stawicki's perioperative risk of a major cardiac event is 11% according to the Revised Cardiac Risk Index (RCRI).  Therefore, he is at high risk for perioperative complications.   His functional capacity is fair at 6.05 METs according to the [facility] Activity Status Index (DASI). Recommendations: According to ACC/AHA guidelines, no further cardiovascular testing needed.  The patient may proceed to surgery at acceptable risk.   Antiplatelet and/or Anticoagulation Recommendations: Aspirin can be held for 7 days prior to his surgery.  Please resume Aspirin post operatively when it is felt to be safe from a bleeding standpoint.  Clopidogrel (Plavix) can be held for 5 days prior to his surgery and resumed as soon as possible post op.  2.  Coronary artery disease: -s/p CABG 2007 with 1/4 patent bypass grafts and chronic mid LAD occlusion with LIMA graft subsequent LHC and most recent heart cath completed 02/2022 with no targets for PCI.  Patient was referred to CVTS but is high risk for bypass. -Today patient reports that his shortness of breath has resolved and has not return current since his left heart catheterization 2 months ago. -Continue GDMT with with ASA 81 mg, Plavix 75 mg, Zetia 10 mg, Imdur 30 mg, Toprol-XL 25 mg, as needed Nitrostat, Crestor 40 mg daily  3.  Hyperlipidemia: -Patient's last LDL cholesterol was 62 at goal -Continue Crestor  and Zetia as noted above  4.  Dyspnea on exertion: -Today patient reports that shortness of breath has resolved and is no longer bothersome since his left heart catheterization.   Disposition: Follow-up with Lauree Chandler, MD or APP as scheduled    Medication Adjustments/Labs and Tests Ordered: Current medicines are reviewed at length with the patient today.  Concerns regarding medicines are outlined above.   Signed, Mable Fill, Marissa Nestle, NP 04/30/2022, 1:22 PM [facility]  Note:  This document was prepared using Dragon voice recognition software and may include unintentional dictation errors.

## 2022-05-01 ENCOUNTER — Encounter: Payer: Self-pay | Admitting: Nurse Practitioner

## 2022-05-01 ENCOUNTER — Ambulatory Visit: Payer: PPO | Attending: Nurse Practitioner | Admitting: Nurse Practitioner

## 2022-05-01 VITALS — BP 130/62 | HR 79 | Ht 71.0 in | Wt 206.6 lb

## 2022-05-01 DIAGNOSIS — R0609 Other forms of dyspnea: Secondary | ICD-10-CM | POA: Diagnosis not present

## 2022-05-01 DIAGNOSIS — I25810 Atherosclerosis of coronary artery bypass graft(s) without angina pectoris: Secondary | ICD-10-CM | POA: Diagnosis not present

## 2022-05-01 DIAGNOSIS — E785 Hyperlipidemia, unspecified: Secondary | ICD-10-CM

## 2022-05-01 DIAGNOSIS — Z01810 Encounter for preprocedural cardiovascular examination: Secondary | ICD-10-CM | POA: Diagnosis not present

## 2022-05-01 NOTE — Patient Instructions (Addendum)
Medication Instructions:  HOLD Plavix 5 days prior to surgery, then resume once bleeding has stopped CONTINUE Aspirin; BUT if needing to hold, hold for 7 days, then resume once bleeding has stopped *If you need a refill on your cardiac medications before your next appointment, please call your pharmacy*   Lab Work: None ordered   Testing/Procedures: None ordered   Follow-Up: At [facility], you and your health needs are our priority.  As part of our continuing mission to provide you with exceptional heart care, we have created designated Provider Care Teams.  These Care Teams include your primary Cardiologist (physician) and Advanced Practice Providers (APPs -  Physician Assistants and Nurse Practitioners) who all work together to provide you with the care you need, when you need it.  We recommend signing up for the patient portal called "MyChart".  Sign up information is provided on this After Visit Summary.  MyChart is used to connect with patients for Virtual Visits (Telemedicine).  Patients are able to view lab/test results, encounter notes, upcoming appointments, etc.  Non-urgent messages can be sent to your provider as well.   To learn more about what you can do with MyChart, go to [email or web address].    Your next appointment:   6 month(s)  Provider:   Lauree Chandler, MD     Other Instructions YOU ARE CLEARED FOR YOUR PROCEDURE.

## 2022-05-04 DIAGNOSIS — H6121 Impacted cerumen, right ear: Secondary | ICD-10-CM | POA: Diagnosis not present

## 2022-05-06 DIAGNOSIS — H25811 Combined forms of age-related cataract, right eye: Secondary | ICD-10-CM | POA: Diagnosis not present

## 2022-05-06 DIAGNOSIS — D23111 Other benign neoplasm of skin of right upper eyelid, including canthus: Secondary | ICD-10-CM | POA: Diagnosis not present

## 2022-05-06 DIAGNOSIS — H01021 Squamous blepharitis right upper eyelid: Secondary | ICD-10-CM | POA: Diagnosis not present

## 2022-05-06 DIAGNOSIS — J449 Chronic obstructive pulmonary disease, unspecified: Secondary | ICD-10-CM | POA: Diagnosis not present

## 2022-05-06 DIAGNOSIS — H43812 Vitreous degeneration, left eye: Secondary | ICD-10-CM | POA: Diagnosis not present

## 2022-05-08 ENCOUNTER — Inpatient Hospital Stay: Payer: PPO | Attending: Oncology | Admitting: Oncology

## 2022-05-08 ENCOUNTER — Encounter: Payer: Self-pay | Admitting: *Deleted

## 2022-05-08 NOTE — Progress Notes (Signed)
"  No show" for OV on 05/06/22. Scheduling message sent to reschedule for April

## 2022-05-14 DIAGNOSIS — K4020 Bilateral inguinal hernia, without obstruction or gangrene, not specified as recurrent: Secondary | ICD-10-CM | POA: Diagnosis not present

## 2022-05-14 DIAGNOSIS — K429 Umbilical hernia without obstruction or gangrene: Secondary | ICD-10-CM | POA: Diagnosis not present

## 2022-05-17 ENCOUNTER — Ambulatory Visit: Payer: Self-pay | Admitting: Surgery

## 2022-05-20 NOTE — Patient Instructions (Signed)
DUE TO COVID-19 ONLY TWO VISITORS  (aged 78 and older)  ARE ALLOWED TO COME WITH YOU AND STAY IN THE WAITING ROOM ONLY DURING PRE OP AND PROCEDURE.   **NO VISITORS ARE ALLOWED IN THE SHORT STAY AREA OR RECOVERY ROOM!!**  IF YOU WILL BE ADMITTED INTO THE HOSPITAL YOU ARE ALLOWED ONLY FOUR SUPPORT PEOPLE DURING VISITATION HOURS ONLY (7 AM -8PM)   The support person(s) must pass our screening, gel in and out, and wear a mask at all times, including in the patient's room. Patients must also wear a mask when staff or their support person are in the room. Visitors GUEST BADGE MUST BE WORN VISIBLY  One adult visitor may remain with you overnight and MUST be in the room by 8 P.M.     Your procedure is scheduled on: 06/01/22   Report to [facility] Main Entrance    Report to admitting at : 11:00 AM   Call this number if you have problems the morning of surgery [phone]   Do not eat food :After Midnight.   After Midnight you may have the following liquids until : 10:00 AM DAY OF SURGERY  Water Black Coffee (sugar ok, NO MILK/CREAM OR CREAMERS)  Tea (sugar ok, NO MILK/CREAM OR CREAMERS) regular and decaf                             Plain Jell-O (NO RED)                                           Fruit ices (not with fruit pulp, NO RED)                                     Popsicles (NO RED)                                                                  Juice: apple, WHITE grape, WHITE cranberry Sports drinks like Gatorade (NO RED)        Oral Hygiene is also important to reduce your risk of infection.                                    Remember - BRUSH YOUR TEETH THE MORNING OF SURGERY WITH YOUR REGULAR TOOTHPASTE  DENTURES WILL BE REMOVED PRIOR TO SURGERY PLEASE DO NOT APPLY "Poly grip" OR ADHESIVES!!!   Do NOT smoke after Midnight   Take these medicines the morning of surgery with A SIP OF WATER: isosorbide,metoprolol,amlodipine.Use inhalers as usual.Tylenol as needed.                               You may not have any metal on your body including hair pins, jewelry, and body piercing             Do not wear lotions, powders, perfumes/cologne, or deodorant  Men may shave face and neck.   Do not bring valuables to the hospital. [facility] IS NOT             RESPONSIBLE   FOR VALUABLES.   Contacts, glasses, or bridgework may not be worn into surgery.   Bring small overnight bag day of surgery.   DO NOT BRING YOUR HOME MEDICATIONS TO THE HOSPITAL. PHARMACY WILL DISPENSE MEDICATIONS LISTED ON YOUR MEDICATION LIST TO YOU DURING YOUR ADMISSION IN THE HOSPITAL!    Patients discharged on the day of surgery will not be allowed to drive home.  Someone NEEDS to stay with you for the first 24 hours after anesthesia.   Special Instructions: Bring a copy of your healthcare power of attorney and living will documents         the day of surgery if you haven't scanned them before.              Please read over the following fact sheets you were given: IF YOU HAVE QUESTIONS ABOUT YOUR PRE-OP INSTRUCTIONS PLEASE CALL [phone]    [facility] - Preparing for Surgery Before surgery, you can play an important role.  Because skin is not sterile, your skin needs to be as free of germs as possible.  You can reduce the number of germs on your skin by washing with CHG (chlorahexidine gluconate) soap before surgery.  CHG is an antiseptic cleaner which kills germs and bonds with the skin to continue killing germs even after washing. Please DO NOT use if you have an allergy to CHG or antibacterial soaps.  If your skin becomes reddened/irritated stop using the CHG and inform your nurse when you arrive at Short Stay. Do not shave (including legs and underarms) for at least 48 hours prior to the first CHG shower.  You may shave your face/neck. Please follow these instructions carefully:  1.  Shower with CHG Soap the night before surgery and the  morning of Surgery.  2.  If  you choose to wash your hair, wash your hair first as usual with your  normal  shampoo.  3.  After you shampoo, rinse your hair and body thoroughly to remove the  shampoo.                           4.  Use CHG as you would any other liquid soap.  You can apply chg directly  to the skin and wash                       Gently with a scrungie or clean washcloth.  5.  Apply the CHG Soap to your body ONLY FROM THE NECK DOWN.   Do not use on face/ open                           Wound or open sores. Avoid contact with eyes, ears mouth and genitals (private parts).                       Wash face,  Genitals (private parts) with your normal soap.             6.  Wash thoroughly, paying special attention to the area where your surgery  will be performed.  7.  Thoroughly rinse your body with warm water from the neck down.  8.  DO  NOT shower/wash with your normal soap after using and rinsing off  the CHG Soap.                9.  Pat yourself dry with a clean towel.            10.  Wear clean pajamas.            11.  Place clean sheets on your bed the night of your first shower and do not  sleep with pets. Day of Surgery : Do not apply any lotions/deodorants the morning of surgery.  Please wear clean clothes to the hospital/surgery center.  FAILURE TO FOLLOW THESE INSTRUCTIONS MAY RESULT IN THE CANCELLATION OF YOUR SURGERY PATIENT SIGNATURE_________________________________  NURSE SIGNATURE__________________________________  ________________________________________________________________________

## 2022-05-21 ENCOUNTER — Encounter (HOSPITAL_COMMUNITY): Payer: Self-pay

## 2022-05-21 ENCOUNTER — Encounter (HOSPITAL_COMMUNITY)
Admission: RE | Admit: 2022-05-21 | Discharge: 2022-05-21 | Disposition: A | Discharge status: Home / Self Care | Payer: PPO | Source: Ambulatory Visit | Attending: Internal Medicine | Admitting: Internal Medicine

## 2022-05-21 ENCOUNTER — Other Ambulatory Visit: Payer: Self-pay

## 2022-05-21 VITALS — BP 137/75 | HR 80 | Temp 97.9°F | Ht 71.0 in | Wt 200.0 lb

## 2022-05-21 DIAGNOSIS — Z8572 Personal history of non-Hodgkin lymphomas: Secondary | ICD-10-CM | POA: Insufficient documentation

## 2022-05-21 DIAGNOSIS — K4020 Bilateral inguinal hernia, without obstruction or gangrene, not specified as recurrent: Secondary | ICD-10-CM | POA: Diagnosis not present

## 2022-05-21 DIAGNOSIS — Z87891 Personal history of nicotine dependence: Secondary | ICD-10-CM | POA: Diagnosis not present

## 2022-05-21 DIAGNOSIS — I2510 Atherosclerotic heart disease of native coronary artery without angina pectoris: Secondary | ICD-10-CM | POA: Diagnosis not present

## 2022-05-21 DIAGNOSIS — K219 Gastro-esophageal reflux disease without esophagitis: Secondary | ICD-10-CM | POA: Diagnosis not present

## 2022-05-21 DIAGNOSIS — K429 Umbilical hernia without obstruction or gangrene: Secondary | ICD-10-CM | POA: Insufficient documentation

## 2022-05-21 DIAGNOSIS — I25810 Atherosclerosis of coronary artery bypass graft(s) without angina pectoris: Secondary | ICD-10-CM

## 2022-05-21 DIAGNOSIS — Z951 Presence of aortocoronary bypass graft: Secondary | ICD-10-CM | POA: Insufficient documentation

## 2022-05-21 DIAGNOSIS — Z01812 Encounter for preprocedural laboratory examination: Secondary | ICD-10-CM | POA: Diagnosis not present

## 2022-05-21 DIAGNOSIS — I10 Essential (primary) hypertension: Secondary | ICD-10-CM | POA: Insufficient documentation

## 2022-05-21 LAB — CBC
HCT: 38.3 % — ABNORMAL LOW (ref 39.0–52.0)
Hemoglobin: 12.3 g/dL — ABNORMAL LOW (ref 13.0–17.0)
MCH: 29.9 pg (ref 26.0–34.0)
MCHC: 32.1 g/dL (ref 30.0–36.0)
MCV: 93 fL (ref 80.0–100.0)
Platelets: 285 10*3/uL (ref 150–400)
RBC: 4.12 MIL/uL — ABNORMAL LOW (ref 4.22–5.81)
RDW: 13.5 % (ref 11.5–15.5)
WBC: 8.6 10*3/uL (ref 4.0–10.5)
nRBC: 0 % (ref 0.0–0.2)

## 2022-05-21 LAB — BASIC METABOLIC PANEL
Anion gap: 5 (ref 5–15)
BUN: 19 mg/dL (ref 8–23)
CO2: 25 mmol/L (ref 22–32)
Calcium: 8.8 mg/dL — ABNORMAL LOW (ref 8.9–10.3)
Chloride: 108 mmol/L (ref 98–111)
Creatinine, Ser: 1.32 mg/dL — ABNORMAL HIGH (ref 0.61–1.24)
GFR, Estimated: 55 mL/min — ABNORMAL LOW (ref >60)
Glucose, Bld: 94 mg/dL (ref 70–99)
Potassium: 4.1 mmol/L (ref 3.5–5.1)
Sodium: 138 mmol/L (ref 135–145)

## 2022-05-21 NOTE — Progress Notes (Signed)
For Short Stay: COVID SWAB appointment date:  Bowel Prep reminder:N/A   For Anesthesia: PCP - Dr. Jarome Matin. Cardiologist - Dr. Verne Carrow. Clearance: Robin Searing: NP: 05/01/22 Chest x-ray - 03/15/22 EKG - 05/01/22 Stress Test -  ECHO - 07/16/21 Cardiac Cath - 02/26/22 Pacemaker/ICD device last checked: Pacemaker orders received: Device Rep notified:  Spinal Cord Stimulator:  Sleep Study - Yes CPAP - Yes  Fasting Blood Sugar - N/A Checks Blood Sugar _____ times a day Date and result of last Hgb A1c-  Last dose of GLP1 agonist- N/A GLP1 instructions:   Last dose of SGLT-2 inhibitors-  SGLT-2 instructions:   Blood Thinner Instructions: Plavix: will be hold after: 05/26/22 Aspirin Instructions:Will be held after: 05/24/22 Last Dose:  Activity level: Can go up a flight of stairs and activities of daily living without stopping and without chest pain and/or shortness of breath   Able to exercise without chest pain and/or shortness of breath  Anesthesia review: Hx: OSA(CPAP),MI,CAD,HTN,CABG x 4: 2007.  Patient denies shortness of breath, fever, cough and chest pain at PAT appointment   Patient verbalized understanding of instructions that were given to them at the PAT appointment. Patient was also instructed that they will need to review over the PAT instructions again at home before surgery.

## 2022-05-22 NOTE — Progress Notes (Signed)
Anesthesia chart reviewed   Case: [id number] Date/Time: 06/01/22 1300   Procedures:      LAPAROSCOPIC BILATERAL INGUINAL HERNIA REPAIR WITH MESH (Bilateral)     OPEN PRIMARY UMBILICAL HERNIA REPAIR   Anesthesia type: General   Pre-op diagnosis: BILATERAL INGUINAL HERNIA AND UMBILICAL HERNIA   Location: [facility] ROOM 02 / [facility]   Surgeons: Quentin Ore, MD       DISCUSSION: 77 year old former smoker with history of HTN, GERD, CAD (CABG 2007, DES 2018), non-Hodgkin's lymphoma, bilateral inguinal hernia and umbilical hernia scheduled for above procedure 06/01/22 Dr. Ivar Drape.   Patient left seen by cardiology 05/01/2022 for preoperative evaluation.  Per office visit note, "Joseph Soto's perioperative risk of a major cardiac event is 11% according to the Revised Cardiac Risk Index (RCRI).  Therefore, he is at high risk for perioperative complications.   His functional capacity is fair at 6.05 METs according to the Duke Activity Status Index (DASI). Recommendations: According to ACC/AHA guidelines, no further cardiovascular testing needed.  The patient may proceed to surgery at acceptable risk.   Antiplatelet and/or Anticoagulation Recommendations: Aspirin can be held for 7 days prior to his surgery.  Please resume Aspirin post operatively when it is felt to be safe from a bleeding standpoint.  Clopidogrel (Plavix) can be held for 5 days prior to his surgery and resumed as soon as possible post op."  Anticipate pt can proceed with planned procedure barring acute status change.   VS: BP 137/75   Pulse 80   Temp 36.6 C (Oral)   Ht  (1.803 m)   Wt 90.7 kg   SpO2 100%   BMI 27.89 kg/m   PROVIDERS: Garlan Fillers, MD is PCP  Cardiologist - Dr. Verne Carrow.  LABS: Labs reviewed: Acceptable for surgery. (all labs ordered are listed, but only abnormal results are displayed)  Labs Reviewed  BASIC METABOLIC PANEL - Abnormal; Notable for the following components:       Result Value   Creatinine, Ser 1.32 (*)    Calcium 8.8 (*)    GFR, Estimated 55 (*)    All other components within normal limits  CBC - Abnormal; Notable for the following components:   RBC 4.12 (*)    Hemoglobin 12.3 (*)    HCT 38.3 (*)    All other components within normal limits     IMAGES:   EKG:   CV: Echo 07/16/2021 1. Left ventricular ejection fraction, by estimation, is 55 to 60%. The  left ventricle has normal function. The left ventricle demonstrates  regional wall motion abnormalities (see scoring diagram/findings for  description). Left ventricular diastolic  parameters are consistent with Grade I diastolic dysfunction (impaired  relaxation).   2. Right ventricular systolic function is normal. The right ventricular  size is normal. Tricuspid regurgitation signal is inadequate for assessing  PA pressure.   3. The mitral valve is grossly normal. No evidence of mitral valve  regurgitation. No evidence of mitral stenosis.   4. The aortic valve is tricuspid. Aortic valve regurgitation is not  visualized. No aortic stenosis is present.   5. The inferior vena cava is normal in size with greater than 50%  respiratory variability, suggesting right atrial pressure of 3 mmHg.  Past Medical History:  Diagnosis Date   Allergic rhinitis    grass, dust   Buttock pain    CAD (coronary artery disease)    Last cath 2010 per Dr. 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 Silvano Rusk office)   Myeloma    "found it in my chest when they did the excision"   Myocardial infarction 06/2005   Non Hodgkin's lymphoma dx'd 06/2005    Past Surgical History:  Procedure Laterality Date   BALLOON SINUPLASTY  2014   CARDIAC CATHETERIZATION  ~ 2008   "Dr. Deborah Chalk"   CATARACT EXTRACTION W/ INTRAOCULAR LENS IMPLANT Right    colonscopy  2010   CORONARY ANGIOPLASTY WITH STENT PLACEMENT  06/17/2016   CORONARY ARTERY BYPASS GRAFT  5/07   x5. LV dysfunction.    CORONARY BALLOON ANGIOPLASTY N/A 06/14/2019   Procedure: CORONARY BALLOON ANGIOPLASTY;  Surgeon: Kathleene Hazel, MD;  Location: [facility];  Service: Cardiovascular;  Laterality: N/A;   CORONARY STENT INTERVENTION N/A 06/17/2016   Procedure: Coronary Stent Intervention;  Surgeon: Kathleene Hazel, MD;  Location: [facility];  Service: Cardiovascular;  Laterality: N/A;   epidural steroid injections     LAPAROSCOPIC CHOLECYSTECTOMY  1996   LEFT HEART CATH AND CORS/GRAFTS ANGIOGRAPHY N/A 06/17/2016   Procedure: Left Heart Cath and Cors/Grafts Angiography;  Surgeon: Kathleene Hazel, MD;  Location: [facility];  Service: Cardiovascular;  Laterality: N/A;   LEFT HEART CATH AND CORS/GRAFTS ANGIOGRAPHY N/A 06/14/2019   Procedure: LEFT HEART CATH AND CORS/GRAFTS ANGIOGRAPHY;  Surgeon: Kathleene Hazel, MD;  Location: [facility];  Service: Cardiovascular;  Laterality: N/A;   LEFT HEART CATH AND CORS/GRAFTS ANGIOGRAPHY N/A 02/26/2022   Procedure: LEFT HEART CATH AND CORS/GRAFTS ANGIOGRAPHY;  Surgeon: Kathleene Hazel, MD;  Location: [facility];  Service: Cardiovascular;  Laterality: N/A;   MASS EXCISION N/A 11/11/2015   Procedure: EXCISION OF MID CHEST  MASS;  Surgeon: Jimmye Norman, MD;  Location: [facility];  Service: General;  Laterality: N/A;   MASS EXCISION N/A 12/23/2015    Procedure: REEXCISION OF CHEST WALL TUMOR SITE;  Surgeon: Jimmye Norman, MD;  Location: [facility];  Service: General;  Laterality: N/A;   POPLITEAL SYNOVIAL CYST EXCISION Right    TONSILLECTOMY  1952   & Adenoidectomy    MEDICATIONS:  acetaminophen (TYLENOL) 500 MG tablet   albuterol (VENTOLIN HFA) 108 (90 Base) MCG/ACT inhaler   amLODipine (NORVASC) 5 MG tablet   aspirin EC 81 MG tablet   BREZTRI AEROSPHERE 160-9-4.8 MCG/ACT AERO   cholecalciferol (VITAMIN D) 1000 units tablet   clopidogrel (PLAVIX) 75 MG tablet   Coenzyme Q10 300 MG CAPS   cyclobenzaprine (FLEXERIL) 10 MG tablet   ezetimibe (ZETIA) 10 MG tablet   fluticasone (FLONASE) 50 MCG/ACT nasal spray   isosorbide mononitrate (IMDUR) 30 MG 24 hr tablet   metoprolol succinate (TOPROL-XL) 25 MG 24 hr tablet   naltrexone (DEPADE) 50 MG tablet   nitroGLYCERIN (NITROSTAT) 0.4 MG  SL tablet   Olopatadine HCl (PATADAY) 0.2 % SOLN   Omega-3 Fatty Acids (FISH OIL) 1200 MG CAPS   Polyvinyl Alcohol-Povidone PF (REFRESH) 1.4-0.6 % SOLN   rosuvastatin (CRESTOR) 40 MG tablet   No current facility-administered medications for this encounter.    Jodell Cipro Ward, PA-C [facility]Surgical Testing [phone]

## 2022-05-22 NOTE — Anesthesia Preprocedure Evaluation (Addendum)
Anesthesia Evaluation  Patient identified by MRN, date of birth, ID band Patient awake    Reviewed: Allergy & Precautions, NPO status , Patient's Chart, lab work & pertinent test results, reviewed documented beta blocker date and time   History of Anesthesia Complications Negative for: history of anesthetic complications  Airway Mallampati: III  TM Distance: >3 FB Neck ROM: Full    Dental  (+) Dental Advisory Given, Teeth Intact   Pulmonary neg shortness of breath, COPD,  COPD inhaler, neg recent URI, former smoker   breath sounds clear to auscultation       Cardiovascular hypertension, Pt. on medications and Pt. on home beta blockers (-) angina + CAD, + Past MI and + CABG   Rhythm:Regular     Neuro/Psych  Neuromuscular disease  negative psych ROS   GI/Hepatic Neg liver ROS,GERD  ,,  Endo/Other    Renal/GU CRFRenal disease     Musculoskeletal  (+)  Fibromyalgia -  Abdominal   Peds  Hematology  (+) Blood dyscrasia, anemia Lab Results      Component                Value               Date                      WBC                      8.6                 05/21/2022                HGB                      12.3 (L)            05/21/2022                HCT                      38.3 (L)            05/21/2022                MCV                      93.0                05/21/2022                PLT                      285                 05/21/2022              Anesthesia Other Findings   Reproductive/Obstetrics                             Anesthesia Physical Anesthesia Plan  ASA: 3  Anesthesia Plan: General   Post-op Pain Management: Tylenol PO (pre-op)*   Induction: Intravenous  PONV Risk Score and Plan: 2 and Ondansetron and Dexamethasone  Airway Management Planned: Oral ETT  Additional Equipment: None  Intra-op Plan:   Post-operative Plan: Extubation in OR  Informed Consent:  I have reviewed the patients History and  Physical, chart, labs and discussed the procedure including the risks, benefits and alternatives for the proposed anesthesia with the patient or authorized representative who has indicated his/her understanding and acceptance.     Dental advisory given  Plan Discussed with: CRNA  Anesthesia Plan Comments: (See PAT note 05/21/2022)       Anesthesia Quick Evaluation

## 2022-06-01 ENCOUNTER — Encounter (HOSPITAL_COMMUNITY)
Admission: RE | Disposition: A | Discharge status: Home / Self Care | Payer: Self-pay | Source: Ambulatory Visit | Attending: Surgery

## 2022-06-01 ENCOUNTER — Ambulatory Visit (HOSPITAL_BASED_OUTPATIENT_CLINIC_OR_DEPARTMENT_OTHER): Payer: PPO | Admitting: Certified Registered"

## 2022-06-01 ENCOUNTER — Ambulatory Visit (HOSPITAL_COMMUNITY): Payer: PPO | Admitting: Physician Assistant

## 2022-06-01 ENCOUNTER — Other Ambulatory Visit: Payer: Self-pay

## 2022-06-01 ENCOUNTER — Ambulatory Visit (HOSPITAL_COMMUNITY)
Admission: RE | Admit: 2022-06-01 | Discharge: 2022-06-01 | Disposition: A | Discharge status: Home / Self Care | Payer: PPO | Source: Ambulatory Visit | Attending: Surgery | Admitting: Surgery

## 2022-06-01 ENCOUNTER — Encounter (HOSPITAL_COMMUNITY): Payer: Self-pay | Admitting: Surgery

## 2022-06-01 DIAGNOSIS — K429 Umbilical hernia without obstruction or gangrene: Secondary | ICD-10-CM | POA: Insufficient documentation

## 2022-06-01 DIAGNOSIS — Z951 Presence of aortocoronary bypass graft: Secondary | ICD-10-CM | POA: Insufficient documentation

## 2022-06-01 DIAGNOSIS — K4090 Unilateral inguinal hernia, without obstruction or gangrene, not specified as recurrent: Secondary | ICD-10-CM | POA: Insufficient documentation

## 2022-06-01 DIAGNOSIS — Z87891 Personal history of nicotine dependence: Secondary | ICD-10-CM | POA: Insufficient documentation

## 2022-06-01 DIAGNOSIS — N189 Chronic kidney disease, unspecified: Secondary | ICD-10-CM

## 2022-06-01 DIAGNOSIS — J449 Chronic obstructive pulmonary disease, unspecified: Secondary | ICD-10-CM | POA: Diagnosis not present

## 2022-06-01 DIAGNOSIS — Z79899 Other long term (current) drug therapy: Secondary | ICD-10-CM | POA: Diagnosis not present

## 2022-06-01 DIAGNOSIS — I129 Hypertensive chronic kidney disease with stage 1 through stage 4 chronic kidney disease, or unspecified chronic kidney disease: Secondary | ICD-10-CM

## 2022-06-01 DIAGNOSIS — M797 Fibromyalgia: Secondary | ICD-10-CM | POA: Diagnosis not present

## 2022-06-01 DIAGNOSIS — Z8249 Family history of ischemic heart disease and other diseases of the circulatory system: Secondary | ICD-10-CM | POA: Insufficient documentation

## 2022-06-01 DIAGNOSIS — K4030 Unilateral inguinal hernia, with obstruction, without gangrene, not specified as recurrent: Secondary | ICD-10-CM | POA: Insufficient documentation

## 2022-06-01 DIAGNOSIS — I2510 Atherosclerotic heart disease of native coronary artery without angina pectoris: Secondary | ICD-10-CM | POA: Diagnosis not present

## 2022-06-01 DIAGNOSIS — D631 Anemia in chronic kidney disease: Secondary | ICD-10-CM | POA: Diagnosis not present

## 2022-06-01 DIAGNOSIS — I252 Old myocardial infarction: Secondary | ICD-10-CM | POA: Insufficient documentation

## 2022-06-01 DIAGNOSIS — K4000 Bilateral inguinal hernia, with obstruction, without gangrene, not specified as recurrent: Secondary | ICD-10-CM

## 2022-06-01 DIAGNOSIS — K4020 Bilateral inguinal hernia, without obstruction or gangrene, not specified as recurrent: Secondary | ICD-10-CM | POA: Diagnosis not present

## 2022-06-01 DIAGNOSIS — G709 Myoneural disorder, unspecified: Secondary | ICD-10-CM | POA: Diagnosis not present

## 2022-06-01 HISTORY — PX: UMBILICAL HERNIA REPAIR: SHX196

## 2022-06-01 HISTORY — PX: INGUINAL HERNIA REPAIR: SHX194

## 2022-06-01 SURGERY — REPAIR, HERNIA, INGUINAL, BILATERAL, LAPAROSCOPIC
Anesthesia: General

## 2022-06-01 MED ORDER — OXYCODONE HCL 5 MG PO TABS
ORAL_TABLET | ORAL | Status: AC
  Administered 2022-06-01: 5 mg via ORAL
  Filled 2022-06-01: qty 1

## 2022-06-01 MED ORDER — ENOXAPARIN SODIUM 40 MG/0.4ML IJ SOSY
40.0000 mg | PREFILLED_SYRINGE | Freq: Once | INTRAMUSCULAR | Status: DC
  Filled 2022-06-01: qty 0.4

## 2022-06-01 MED ORDER — BUPIVACAINE LIPOSOME 1.3 % IJ SUSP
INTRAMUSCULAR | Status: AC
  Filled 2022-06-01: qty 20

## 2022-06-01 MED ORDER — FENTANYL CITRATE (PF) 250 MCG/5ML IJ SOLN
INTRAMUSCULAR | Status: DC | PRN
  Administered 2022-06-01: 100 ug via INTRAVENOUS
  Administered 2022-06-01 (×2): 50 ug via INTRAVENOUS

## 2022-06-01 MED ORDER — OXYCODONE HCL 5 MG/5ML PO SOLN: 5.0000 mg | Freq: Once | ORAL | Status: AC | PRN

## 2022-06-01 MED ORDER — LIDOCAINE 2% (20 MG/ML) 5 ML SYRINGE
INTRAMUSCULAR | Status: DC | PRN
  Administered 2022-06-01: 60 mg via INTRAVENOUS

## 2022-06-01 MED ORDER — BUPIVACAINE HCL 0.25 % IJ SOLN
INTRAMUSCULAR | Status: DC | PRN
  Administered 2022-06-01: 30 mL

## 2022-06-01 MED ORDER — ROCURONIUM BROMIDE 10 MG/ML (PF) SYRINGE
PREFILLED_SYRINGE | INTRAVENOUS | Status: DC | PRN
  Administered 2022-06-01: 70 mg via INTRAVENOUS

## 2022-06-01 MED ORDER — PROPOFOL 10 MG/ML IV BOLUS
INTRAVENOUS | Status: DC | PRN
  Administered 2022-06-01: 40 mg via INTRAVENOUS
  Administered 2022-06-01: 110 mg via INTRAVENOUS
  Administered 2022-06-01: 50 mg via INTRAVENOUS

## 2022-06-01 MED ORDER — BUPIVACAINE-EPINEPHRINE 0.25% -1:200000 IJ SOLN: INTRAMUSCULAR | Status: DC | PRN

## 2022-06-01 MED ORDER — SUGAMMADEX SODIUM 200 MG/2ML IV SOLN
INTRAVENOUS | Status: DC | PRN
  Administered 2022-06-01: 200 mg via INTRAVENOUS

## 2022-06-01 MED ORDER — ACETAMINOPHEN 500 MG PO TABS
1000.0000 mg | ORAL_TABLET | ORAL | Status: AC
  Administered 2022-06-01: 1000 mg via ORAL
  Filled 2022-06-01: qty 2

## 2022-06-01 MED ORDER — ACETAMINOPHEN 10 MG/ML IV SOLN
INTRAVENOUS | Status: AC
  Filled 2022-06-01: qty 100

## 2022-06-01 MED ORDER — BUPIVACAINE LIPOSOME 1.3 % IJ SUSP
INTRAMUSCULAR | Status: DC | PRN
  Administered 2022-06-01: 20 mL

## 2022-06-01 MED ORDER — BUPIVACAINE LIPOSOME 1.3 % IJ SUSP: 20.0000 mL | Freq: Once | INTRAMUSCULAR | Status: DC

## 2022-06-01 MED ORDER — FENTANYL CITRATE PF 50 MCG/ML IJ SOSY
PREFILLED_SYRINGE | INTRAMUSCULAR | Status: AC
  Administered 2022-06-01: 50 ug via INTRAVENOUS
  Filled 2022-06-01: qty 2

## 2022-06-01 MED ORDER — FENTANYL CITRATE (PF) 250 MCG/5ML IJ SOLN
INTRAMUSCULAR | Status: AC
  Filled 2022-06-01: qty 5

## 2022-06-01 MED ORDER — OXYCODONE-ACETAMINOPHEN 5-325 MG PO TABS: 1.0000 | ORAL_TABLET | ORAL | 0 refills | Status: DC | PRN

## 2022-06-01 MED ORDER — FENTANYL CITRATE PF 50 MCG/ML IJ SOSY
PREFILLED_SYRINGE | INTRAMUSCULAR | Status: AC
  Administered 2022-06-01: 25 ug via INTRAVENOUS
  Filled 2022-06-01: qty 1

## 2022-06-01 MED ORDER — CHLORHEXIDINE GLUCONATE 0.12 % MT SOLN
15.0000 mL | Freq: Once | OROMUCOSAL | Status: AC
  Administered 2022-06-01: 15 mL via OROMUCOSAL

## 2022-06-01 MED ORDER — CEFAZOLIN SODIUM-DEXTROSE 2-4 GM/100ML-% IV SOLN
2.0000 g | INTRAVENOUS | Status: AC
  Administered 2022-06-01: 2 g via INTRAVENOUS
  Filled 2022-06-01: qty 100

## 2022-06-01 MED ORDER — ACETAMINOPHEN 500 MG PO TABS: 1000.0000 mg | ORAL_TABLET | Freq: Once | ORAL | Status: DC | PRN

## 2022-06-01 MED ORDER — BUPIVACAINE HCL (PF) 0.25 % IJ SOLN
INTRAMUSCULAR | Status: AC
  Filled 2022-06-01: qty 30

## 2022-06-01 MED ORDER — ACETAMINOPHEN 160 MG/5ML PO SOLN: 1000.0000 mg | Freq: Once | ORAL | Status: DC | PRN

## 2022-06-01 MED ORDER — ONDANSETRON HCL 4 MG/2ML IJ SOLN
INTRAMUSCULAR | Status: DC | PRN
  Administered 2022-06-01: 4 mg via INTRAVENOUS

## 2022-06-01 MED ORDER — BUPIVACAINE HCL 0.25 % IJ SOLN
INTRAMUSCULAR | Status: AC
  Filled 2022-06-01: qty 1

## 2022-06-01 MED ORDER — ORAL CARE MOUTH RINSE: 15.0000 mL | Freq: Once | OROMUCOSAL | Status: AC

## 2022-06-01 MED ORDER — DEXAMETHASONE SODIUM PHOSPHATE 10 MG/ML IJ SOLN
INTRAMUSCULAR | Status: DC | PRN
  Administered 2022-06-01: 10 mg via INTRAVENOUS

## 2022-06-01 MED ORDER — CHLORHEXIDINE GLUCONATE CLOTH 2 % EX PADS: 6.0000 | MEDICATED_PAD | Freq: Once | CUTANEOUS | Status: DC

## 2022-06-01 MED ORDER — FENTANYL CITRATE PF 50 MCG/ML IJ SOSY
25.0000 ug | PREFILLED_SYRINGE | INTRAMUSCULAR | Status: DC | PRN
  Administered 2022-06-01: 25 ug via INTRAVENOUS
  Administered 2022-06-01: 50 ug via INTRAVENOUS

## 2022-06-01 MED ORDER — OXYCODONE HCL 5 MG PO TABS: 5.0000 mg | ORAL_TABLET | Freq: Once | ORAL | Status: AC | PRN

## 2022-06-01 MED ORDER — ACETAMINOPHEN 10 MG/ML IV SOLN
1000.0000 mg | Freq: Once | INTRAVENOUS | Status: DC | PRN
  Administered 2022-06-01: 1000 mg via INTRAVENOUS

## 2022-06-01 MED ORDER — 0.9 % SODIUM CHLORIDE (POUR BTL) OPTIME
TOPICAL | Status: DC | PRN
  Administered 2022-06-01: 1000 mL

## 2022-06-01 MED ORDER — LACTATED RINGERS IV SOLN: INTRAVENOUS | Status: DC

## 2022-06-01 SURGICAL SUPPLY — 53 items
ADH SKN CLS APL DERMABOND .7 (GAUZE/BANDAGES/DRESSINGS) ×2
APL PRP STRL LF DISP 70% ISPRP (MISCELLANEOUS) ×2
BAG COUNTER SPONGE SURGICOUNT (BAG) ×2 IMPLANT
BAG SPNG CNTER NS LX DISP (BAG) ×2
BALL CTTN LRG ABS STRL LF (GAUZE/BANDAGES/DRESSINGS) ×2
CABLE HIGH FREQUENCY MONO STRZ (ELECTRODE) ×2 IMPLANT
CHLORAPREP W/TINT 26 (MISCELLANEOUS) ×2 IMPLANT
COTTONBALL LRG STERILE PKG (GAUZE/BANDAGES/DRESSINGS) ×2 IMPLANT
COVER SURGICAL LIGHT HANDLE (MISCELLANEOUS) ×2 IMPLANT
DERMABOND ADVANCED .7 DNX12 (GAUZE/BANDAGES/DRESSINGS) ×2 IMPLANT
DRAPE LAPAROTOMY T 98X78 PEDS (DRAPES) ×2 IMPLANT
DRSG TEGADERM 4X4.75 (GAUZE/BANDAGES/DRESSINGS) ×2 IMPLANT
ELECT REM PT RETURN 15FT ADLT (MISCELLANEOUS) ×2 IMPLANT
GLOVE BIO SURGEON STRL SZ7.5 (GLOVE) ×2 IMPLANT
GLOVE BIOGEL PI IND STRL 8 (GLOVE) ×2 IMPLANT
GLOVE INDICATOR 8.0 STRL GRN (GLOVE) ×2 IMPLANT
GOWN STRL REUS W/ TWL XL LVL3 (GOWN DISPOSABLE) ×4 IMPLANT
GOWN STRL REUS W/TWL XL LVL3 (GOWN DISPOSABLE) ×4
GRASPER SUT TROCAR 14GX15 (MISCELLANEOUS) ×2 IMPLANT
IRRIG SUCT STRYKERFLOW 2 WTIP (MISCELLANEOUS)
IRRIGATION SUCT STRKRFLW 2 WTP (MISCELLANEOUS) IMPLANT
KIT BASIN OR (CUSTOM PROCEDURE TRAY) ×2 IMPLANT
KIT TURNOVER KIT A (KITS) IMPLANT
MARKER SKIN DUAL TIP RULER LAB (MISCELLANEOUS) ×2 IMPLANT
MESH 3DMAX 5X7 LT XLRG (Mesh General) IMPLANT
MESH 3DMAX 5X7 RT XLRG (Mesh General) IMPLANT
NDL HYPO 22X1.5 SAFETY MO (MISCELLANEOUS) ×2 IMPLANT
NDL INSUFFLATION 14GA 120MM (NEEDLE) ×4 IMPLANT
NEEDLE HYPO 22X1.5 SAFETY MO (MISCELLANEOUS) ×2 IMPLANT
NEEDLE INSUFFLATION 14GA 120MM (NEEDLE) ×4 IMPLANT
NS IRRIG 1000ML POUR BTL (IV SOLUTION) ×2 IMPLANT
PACK GENERAL/GYN (CUSTOM PROCEDURE TRAY) ×2 IMPLANT
RELOAD STAPLE 4.0 BLU F/HERNIA (INSTRUMENTS) IMPLANT
RELOAD STAPLE 4.8 BLK F/HERNIA (STAPLE) ×2 IMPLANT
RELOAD STAPLE HERNIA 4.0 BLUE (INSTRUMENTS) ×2 IMPLANT
RELOAD STAPLE HERNIA 4.8 BLK (STAPLE) ×2 IMPLANT
SCISSORS LAP 5X35 DISP (ENDOMECHANICALS) ×2 IMPLANT
SET TUBE SMOKE EVAC HIGH FLOW (TUBING) ×2 IMPLANT
SPIKE FLUID TRANSFER (MISCELLANEOUS) ×2 IMPLANT
STAPLER HERNIA 12 8.5 360D (INSTRUMENTS) ×2 IMPLANT
SUT MNCRL AB 4-0 PS2 18 (SUTURE) ×2 IMPLANT
SUT NOVA NAB GS-21 0 18 T12 DT (SUTURE) IMPLANT
SUT PDS AB 2-0 CT2 27 (SUTURE) IMPLANT
SUT VIC AB 3-0 SH 8-18 (SUTURE) ×2 IMPLANT
SUT VICRYL 0 UR6 27IN ABS (SUTURE) IMPLANT
SYR CONTROL 10ML LL (SYRINGE) ×2 IMPLANT
TOWEL OR 17X26 10 PK STRL BLUE (TOWEL DISPOSABLE) ×2 IMPLANT
TRAY FOL W/BAG SLVR 16FR STRL (SET/KITS/TRAYS/PACK) ×2 IMPLANT
TRAY FOLEY MTR SLVR 16FR STAT (SET/KITS/TRAYS/PACK) IMPLANT
TRAY FOLEY W/BAG SLVR 16FR LF (SET/KITS/TRAYS/PACK)
TRAY LAPAROSCOPIC (CUSTOM PROCEDURE TRAY) ×2 IMPLANT
TROCAR ADV FIXATION 12X100MM (TROCAR) ×2 IMPLANT
TROCAR Z-THREAD OPTICAL 5X100M (TROCAR) ×4 IMPLANT
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
## 2022-06-01 NOTE — H&P (Signed)
Admitting Physician: Hyman Hopes Ashleah Valtierra  Service: General Surgery  CC: Bilateral inguinal hernias  Subjective   HPI: Joseph Soto is an 77 y.o. male who is here for laparoscopic bilateral inguinal hernia repair.  Past Medical History:  Diagnosis Date   Allergic rhinitis    grass, dust   Buttock pain    CAD (coronary artery disease)    Last cath 2010 per Dr. 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 Silvano Rusk office)   Myeloma    "found it in my chest when they did the excision"   Myocardial infarction 06/2005   Non Hodgkin's lymphoma dx'd 06/2005    Past Surgical History:  Procedure Laterality Date   BALLOON SINUPLASTY  2014   CARDIAC CATHETERIZATION  ~ 2008   "Dr. Deborah Chalk"   CATARACT EXTRACTION W/ INTRAOCULAR LENS IMPLANT Right    colonscopy  2010   CORONARY ANGIOPLASTY WITH STENT PLACEMENT  06/17/2016   CORONARY ARTERY BYPASS GRAFT  5/07   x5. LV dysfunction.    CORONARY BALLOON ANGIOPLASTY N/A 06/14/2019   Procedure: CORONARY BALLOON ANGIOPLASTY;  Surgeon: Kathleene Hazel, MD;  Location: [facility];  Service: Cardiovascular;  Laterality: N/A;   CORONARY STENT INTERVENTION N/A 06/17/2016   Procedure: Coronary Stent Intervention;  Surgeon: Kathleene Hazel, MD;  Location: [facility];  Service: Cardiovascular;  Laterality: N/A;   epidural steroid injections     LAPAROSCOPIC  CHOLECYSTECTOMY  1996   LEFT HEART CATH AND CORS/GRAFTS ANGIOGRAPHY N/A 06/17/2016   Procedure: Left Heart Cath and Cors/Grafts Angiography;  Surgeon: Kathleene Hazel, MD;  Location: [facility];  Service: Cardiovascular;  Laterality: N/A;   LEFT HEART CATH AND CORS/GRAFTS ANGIOGRAPHY N/A 06/14/2019   Procedure: LEFT HEART CATH AND CORS/GRAFTS ANGIOGRAPHY;  Surgeon: Kathleene Hazel, MD;  Location: [facility];  Service: Cardiovascular;  Laterality: N/A;   LEFT HEART CATH AND CORS/GRAFTS ANGIOGRAPHY N/A 02/26/2022   Procedure: LEFT HEART CATH AND CORS/GRAFTS ANGIOGRAPHY;  Surgeon: Kathleene Hazel, MD;  Location: [facility];  Service: Cardiovascular;  Laterality: N/A;   MASS EXCISION N/A 11/11/2015   Procedure: EXCISION OF MID CHEST  MASS;  Surgeon: Jimmye Norman, MD;  Location: [facility];  Service: General;  Laterality: N/A;   MASS EXCISION N/A 12/23/2015   Procedure: REEXCISION OF CHEST WALL TUMOR SITE;  Surgeon: Jimmye Norman, MD;  Location: [facility];  Service: General;  Laterality: N/A;   POPLITEAL SYNOVIAL CYST EXCISION Right    TONSILLECTOMY  1952   & Adenoidectomy    Family History  Problem Relation Age of Onset   Heart disease Father    Heart attack Father    Congestive Heart Failure Father    COPD Father    Dementia Mother  Cancer Paternal Grandfather        ? type    Social:  reports that he quit smoking about 22 years ago. His smoking use included cigarettes. He has a 60.00 pack-year smoking history. He has never used smokeless tobacco. He reports that he does not currently use alcohol. He reports that he does not use drugs.  Allergies:  Allergies  Allergen Reactions   Niacin And Related Nausea And Vomiting   Niaspan [Niacin Er] Nausea And Vomiting and Other (See Comments)    Stomach intolerance   Ramipril     Other Reaction(s): cough    Medications: Current Outpatient Medications  Medication  Instructions   acetaminophen (TYLENOL) 500-1,000 mg, Oral, Every 6 hours PRN   albuterol (VENTOLIN HFA) 108 (90 Base) MCG/ACT inhaler Inhalation, Every 6 hours PRN   amLODipine (NORVASC) 5 mg, Oral, Daily at bedtime   aspirin EC 81 mg, Oral, Daily at bedtime   BREZTRI AEROSPHERE 160-9-4.8 MCG/ACT AERO 2 puffs, Inhalation, 2 times daily   cholecalciferol (VITAMIN D) 2,000 Units, Oral, Daily with breakfast   clopidogrel (PLAVIX) 75 MG tablet TAKE 1 TABLET BY MOUTH EVERY DAY WITH BREAKFAST   Coenzyme Q10 300 mg, Oral, 2 times daily   cyclobenzaprine (FLEXERIL) 10 mg, Oral, 2 times daily   ezetimibe (ZETIA) 10 mg, Oral, Daily   Fish Oil 1,200 mg, Oral, 2 times daily   fluticasone (FLONASE) 50 MCG/ACT nasal spray 1 spray, Each Nare, Daily PRN   isosorbide mononitrate (IMDUR) 30 mg, Oral, Daily, Please schedule yearly appointment for future refills. 1st attempt. Thank you   metoprolol succinate (TOPROL-XL) 12.5 mg, Oral, Daily   naltrexone (DEPADE) 50 mg, Oral, 2 times daily   nitroGLYCERIN (NITROSTAT) 0.4 mg, Sublingual, Every 5 min PRN   Olopatadine HCl (PATADAY) 0.2 % SOLN Ophthalmic   Polyvinyl Alcohol-Povidone PF (REFRESH) 1.4-0.6 % SOLN 1 drop, Both Eyes, 4 times daily   rosuvastatin (CRESTOR) 40 mg, Oral, Daily    ROS - all of the below systems have been reviewed with the patient and positives are indicated with bold text General: chills, fever or night sweats Eyes: blurry vision or double vision ENT: epistaxis or sore throat Allergy/Immunology: itchy/watery eyes or nasal congestion Hematologic/Lymphatic: bleeding problems, blood clots or swollen lymph nodes Endocrine: temperature intolerance or unexpected weight changes Breast: new or changing breast lumps or nipple discharge Resp: cough, shortness of breath, or wheezing CV: chest pain or dyspnea on exertion GI: as per HPI GU: dysuria, trouble voiding, or hematuria MSK: joint pain or joint stiffness Neuro: TIA or stroke  symptoms Derm: pruritus and skin lesion changes Psych: anxiety and depression  Objective   PE Blood pressure 121/78, pulse 79, temperature 97.9 F (36.6 C), temperature source Oral, resp. rate 18, height  (1.803 m), weight 90.7 kg, SpO2 97 %. Constitutional: NAD; conversant; no deformities Eyes: Moist conjunctiva; no lid lag; anicteric; PERRL Neck: Trachea midline; no thyromegaly Lungs: Normal respiratory effort; no tactile fremitus CV: RRR; no palpable thrills; no pitting edema GI: Abd Bilateral inguinal hernias; no palpable hepatosplenomegaly MSK: Normal range of motion of extremities; no clubbing/cyanosis Psychiatric: Appropriate affect; alert and oriented x3 Lymphatic: No palpable cervical or axillary lymphadenopathy  No results found for this or any previous visit (from the past 24 hour(s)).  Imaging Orders  No imaging studies ordered today     Assessment and Plan   Non-recurrent bilateral inguinal hernia without obstruction or gangrene  Umbilical hernia without obstruction and without gangrene  Umbilical hernia  measures 1.2 cm tall by 0.8 cm wide  Notes from previous office visit: Today we discussed the pathophysiology of umbilical and inguinal hernias. I explained the options for observation versus surgery for these hernias. I described laparoscopic bilateral inguinal hernia repair with mesh and primary closure of the umbilical hernia defect. We discussed the procedure itself as well as its risk, benefits, and alternatives. I suspect with his recent cardiac issues he would be deemed high risk for any sort of general anesthetic. I recommend attempted observation with symptom control with a hernia truss. The patient is adamant that he would like this hernia repaired so he can continue his very active lifestyle. I explained I am not the one to evaluate his cardiac risks. We will send a preoperative cardiac evaluation to his cardiologist and have the patient follow-up in 1  month. In the meantime I encouraged him to return to his all of his normal activities and uses hernia truss as needed to see if living with this hernia is an option.  Notes from previous office visit: Today he returns to review his risks prior to surgery. I again described laparoscopic bilateral inguinal hernia repair with mesh and primary closure of the umbilical hernia defect. We discussed the procedure, its risks, benefits and alterantives. We had a lengthy discussion about his cardiac risks. We reviewed the cardiology evaluation from Robin Searing, NP [facility]) which quoted an 11% risk of major cardiac event according to the Revised Cardiac Risk Index (RCRI). I explained my understanding of major cardiac event to include myocardial infarction [MI], pulmonary embolus [PE], VF, heart block, or cardiac arrest. This calculator does not include risks of stroke, major bleeding, prolonged hospitalization and ICU admission. He explained his symptoms are severely affecting his quality of life - despite wearing a hernia truss. He understands the risks and would like to have the surgery.   Notes from today: Today he presents for surgery.  We again discussed the procedure, its risks, benefits and alternatives.  After a full discussion and all questions answered, the patient granted consent to proceed.  We again discussed his increased cardiac risk.    We will proceed as scheduled.   Quentin Ore, MD  [facility] Use [email or web address] to contact on call provider

## 2022-06-01 NOTE — Transfer of Care (Signed)
Immediate Anesthesia Transfer of Care Note  Patient: Joseph Soto  Procedure(s) Performed: LAPAROSCOPIC BILATERAL INGUINAL HERNIA REPAIR WITH MESH (Bilateral) OPEN PRIMARY UMBILICAL HERNIA REPAIR  Patient Location: PACU  Anesthesia Type:General  Level of Consciousness: awake, alert , and oriented  Airway & Oxygen Therapy: Patient Spontanous Breathing and Patient connected to face mask oxygen  Post-op Assessment: Report given to RN, Post -op Vital signs reviewed and stable, and Patient moving all extremities X 4  Post vital signs: Reviewed and stable  Last Vitals:  Vitals Value Taken Time  BP 118/74 06/01/22 1630  Temp    Pulse 78 06/01/22 1630  Resp 12   SpO2 99 % 06/01/22 1630  Vitals shown include unvalidated device data.  Last Pain:  Vitals:   06/01/22 1136  TempSrc:   PainSc: 0-No pain      Patients Stated Pain Goal: 4 (06/01/22 1136)  Complications: No notable events documented.

## 2022-06-01 NOTE — Anesthesia Postprocedure Evaluation (Signed)
Anesthesia Post Note  Patient: Zebedee Segundo Ghosh  Procedure(s) Performed: LAPAROSCOPIC BILATERAL INGUINAL HERNIA REPAIR WITH MESH (Bilateral) OPEN PRIMARY UMBILICAL HERNIA REPAIR     Patient location during evaluation: PACU Anesthesia Type: General Level of consciousness: awake and alert Pain management: pain level controlled Vital Signs Assessment: post-procedure vital signs reviewed and stable Respiratory status: spontaneous breathing, nonlabored ventilation and respiratory function stable Cardiovascular status: blood pressure returned to baseline and stable Postop Assessment: no apparent nausea or vomiting Anesthetic complications: no   No notable events documented.  Last Vitals:  Vitals:   06/01/22 1800 06/01/22 1834  BP: (!) 132/58 (!) 146/68  Pulse: 70 70  Resp: 14 16  Temp: (!) 36.4 C 36.9 C  SpO2: 100% 100%    Last Pain:  Vitals:   06/01/22 1834  TempSrc:   PainSc: 4                  Belen Zwahlen

## 2022-06-01 NOTE — Discharge Instructions (Signed)
 GROIN HERNIA REPAIR POST OPERATIVE INSTRUCTIONS  Thinking Clearly  The anesthesia may cause you to feel different for 1 or 2 days. Do not drive, drink alcohol, or make any big decisions for at least 2 days.  Nutrition When you wake up, you will be able to drink small amounts of liquid. If you do not feel sick, you can slowly advance your diet to regular foods. Continue to drink lots of fluids, usually about 8 to 10 glasses per day. Eat a high-fiber diet so you don't strain during bowel movements. High-Fiber Foods Foods high in fiber include beans, bran cereals and whole-grain breads, peas, dried fruit (figs, apricots, and dates), raspberries, blackberries, strawberries, sweet corn, broccoli, baked potatoes with skin, plums, pears, apples, greens, and nuts. Activity Slowly increase your activity. Be sure to get up and walk every hour or so to prevent blood clots. No heavy lifting or strenuous activity for 4 weeks following surgery to prevent hernias at your incision sites or recurrence of your hernia. It is normal to feel tired. You may need more sleep than usual.  Get your rest but make sure to get up and move around frequently to prevent blood clots and pneumonia.  Work and Return to School You can go back to work when you feel well enough. Discuss the timing with your surgeon. You can usually go back to school or work 1 week or less after an laparoscopic or an open repair. If your work requires heavy lifting or strenuous activity you need to be placed on light duty for 4 weeks following surgery. You can return to gym class, sports or other physical activities 4 weeks after surgery.  Wound Care You may experience significant bruising in the groin including into the scrotum in males.  Rest, elevating the groin and scrotum above the level of the heart, ice and compression with tight fitting underwear can help.  Always wash your hands before and after touching near your incision site. Do  not soak in a bathtub until cleared at your follow up appointment. You may take a shower 24 hours after surgery. A small amount of drainage from the incision is normal. If the drainage is thick and yellow or the site is red, you may have an infection, so call your surgeon. If you have a drain in one of your incisions, it will be taken out in office when the drainage stops. Steri-Strips will fall off in 7 to 10 days or they will be removed during your first office visit. If you have dermabond glue covering over the incision, allow the glue to flake off on its own. Protect the new skin, especially from the sun. The sun can burn and cause darker scarring. Your scar will heal in about 4 to 6 weeks and will become softer and continue to fade over the next year.  The cosmetic appearance of the incisions will improve over the course of the first year after surgery. Sensation around your incision will return in a few weeks or months.  Bowel Movements After intestinal surgery, you may have loose watery stools for several days. If watery diarrhea lasts longer than 3 days, contact your surgeon. Pain medication (narcotics) can cause constipation. Increase the fiber in your diet with high-fiber foods if you are constipated. You can take an over the counter stool softener like Colace to avoid constipation.  Additional over the counter medications can also be used if Colace isn't sufficient (for example, Milk of Magnesia or Miralax).    Pain The amount of pain is different for each person. Some people need only 1 to 3 doses of pain control medication, while others need more. Take alternating doses of tylenol and ibuprofen around the clock for the first five days following surgery.  This will provide a baseline of pain control and help with inflammation.  Take the narcotic pain medication in addition if needed for severe pain.  Contact Your Surgeon at [phone], if you have: Pain that will not go away Pain that  gets worse A fever of more than 101F (38.3C) Repeated vomiting Swelling, redness, bleeding, or bad-smelling drainage from your wound site Strong abdominal pain No bowel movement or unable to pass gas for 3 days Watery diarrhea lasting longer than 3 days  Pain Control The goal of pain control is to minimize pain, keep you moving and help you heal. Your surgical team will work with you on your pain plan. Most often a combination of therapies and medications are used to control your pain. You may also be given medication (local anesthetic) at the surgical site. This may help control your pain for several days. Extreme pain puts extra stress on your body at a time when your body needs to focus on healing. Do not wait until your pain has reached a level "10" or is unbearable before telling your doctor or nurse. It is much easier to control pain before it becomes severe. Following a laparoscopic procedure, pain is sometimes felt in the shoulder. This is due to the gas inserted into your abdomen during the procedure. Moving and walking helps to decrease the gas and the right shoulder pain.  Use the guide below for ways to manage your post-operative pain. Learn more by going to facs.org/safepaincontrol.  How Intense Is My Pain Common Therapies to Feel Better       I hardly notice my pain, and it does not interfere with my activities.  I notice my pain and it distracts me, but I can still do activities (sitting up, walking, standing).  Non-Medication Therapies  Ice (in a bag, applied over clothing at the surgical site), elevation, rest, meditation, massage, distraction (music, TV, play) walking and mild exercise Splinting the abdomen with pillows +  Non-Opioid Medications Acetaminophen (Tylenol) Non-steroidal anti-inflammatory drugs (NSAIDS) Aspirin, Ibuprofen (Motrin, Advil) Naproxen (Aleve) Take these as needed, when you feel pain. Both acetaminophen and NSAIDs help to decrease pain  and swelling (inflammation).      My pain is hard to ignore and is more noticeable even when I rest.  My pain interferes with my usual activities.  Non-Medication Therapies  +  Non-Opioid medications  Take on a regular schedule (around-the-clock) instead of as needed. (For example, Tylenol every 6 hours at 9:00 am, 3:00 pm, 9:00 pm, 3:00 am and Motrin every 6 hours at 12:00 am, 6:00 am, 12:00 pm, 6:00 pm)         I am focused on my pain, and I am not doing my daily activities.  I am groaning in pain, and I cannot sleep. I am unable to do anything.  My pain is as bad as it could be, and nothing else matters.  Non-Medication Therapies  +  Around-the-Clock Non-Opioid Medications  +  Short-acting opioids  Opioids should be used with other medications to manage severe pain. Opioids block pain and give a feeling of euphoria (feel high). Addiction, a serious side effect of opioids, is rare with short-term (a few days) use.  Examples of short-acting opioids   include: Tramadol (Ultram), Hydrocodone (Norco, Vicodin), Hydromorphone (Dilaudid), Oxycodone (Oxycontin)     The above directions have been adapted from the American College of Surgeons Surgical Patient Education Program.  Please refer to the ACS website if needed: [email or web address]://[email or web address]/-/media/[email or web address]   Suleiman Finigan, MD [facility] [address], [address], [place], [place]  [address] ?  [address] [address], [place], [place]   [address] [phone] ? [phone] ? FAX [phone] Web site: [email or web address]  

## 2022-06-01 NOTE — Op Note (Signed)
Patient: Joseph Soto (May 02, 1945, [id number])  Date of Surgery: 06/01/2022   Preoperative Diagnosis: BILATERAL INGUINAL HERNIA AND UMBILICAL HERNIA (1.2 cm tall by 0.8 cm wide on preoperative CT)  Postoperative Diagnosis: BILATERAL INGUINAL HERNIA AND UMBILICAL HERNIA  (1.2 cm tall by 0.8 cm wide )  Surgical Procedure: LAPAROSCOPIC BILATERAL INGUINAL HERNIA REPAIR WITH MESH:  OPEN PRIMARY UMBILICAL HERNIA REPAIR:    Operative Team Members:  Surgeon(s) and Role:    * Tryphena Perkovich, Hyman Hopes, MD - Primary   Anesthesiologist: Val Eagle, MD CRNA: Elisabeth Cara, CRNA; Orest Dikes, CRNA; Nelle Don, CRNA   Anesthesia: General   Fluids:  Total I/O In: 600 [I.V.:500; IV Piggyback:100] Out: 10 [Blood:10]  Complications: None  Drains:  None  Specimen: None  Disposition:  PACU - hemodynamically stable.  Plan of Care: Discharge to home after PACU  Indications for Procedure: Joseph Soto is a 77 y.o. male who presented with bilateral inguinal and umbilical hernias.  We met multiple times in office to discuss hernia care due to his cardiac condition.  He decided he wanted to proceed with surgery despite his increased cardiac risk.  I recommended laparoscopic bilateral inguinal hernia repair with mesh and primary closure of umbilical hernia.  We discussed the procedure, its risks, benefits and alternatives and the patient granted consent to proceed.  Findings:  Technique: Transabdominal preperitoneal (TAPP) Hernia Location: Right direct inguinal hernia, left indirect inguinal hernia containing incarcerated sigmoid colon, reducible umbilical hernia Mesh Size &Type:  Bard 3D Max Extra-Large right mesh, Bard 3D max Extra-large left sided mesh, No umbilical hernia mesh used Mesh Fixation: Endo-Universal hernia stapler  Infection status: Patient: Private Patient Elective Case Case: Elective Infection Present At Time Of Surgery (PATOS):  None   Description of Procedure:  The patient was positioned supine, padded and secured to the bed, with both arms tucked.  The abdomen was widely prepped and draped.  A time out procedure was performed.  A 1 cm infraumbilical incision was made.  The abdomen was entered without trauma to the underlying viscera.  The abdomen was insufflated to 15 mm of Hg.  A 12 mm trocar was inserted at the periumbilical incision.  Additional 5 mm trocars were placed in the left and right abdomen.  There was no trauma to the underlying viscera.  There was an direct fat containing hernia on the RIGHT.  Utilizing a transabdominal pre peritoneal technique (TAPP), a horizontal incision was made in the peritoneum, immediately below the umbilicus.  Dissection was carried out in the pre peritoneal space down to the level of the hernia sac which was reduced into the peritoneal cavity completely.  The cord contents were parietalized and preserved.  A large pre peritoneal dissection was performed to uncover the direct, indirect, femoral and obturator spaces.  Cooper's ligament was uncovered medially and the psoas muscle uncovered laterally.  The mesh, as documented above, was opened and advanced into the pre peritoneal position so that it more than adequately covered the indirect, direct, femoral and obturator spaces.  The mesh laid flat, with no inferior folds and covered the entire myopectineal orifice.  The mesh was fixated with the endo-universal hernia stapler to Cooper's ligament and the posterior aspect of the rectus muscle.  The peritoneal flap was closed with the same device.  There were no peritoneal defects or exposed mesh at the conclusion.  There was an indirect sigmoid colon containing hernia on the LEFT.  Utilizing a transabdominal pre peritoneal technique (  TAPP), a horizontal incision was made in the peritoneum, immediately below the umbilicus.  Dissection was carried out in the pre peritoneal space down to the  level of the hernia sac which was reduced into the peritoneal cavity completely.  The cord contents were parietalized and preserved.  A large pre peritoneal dissection was performed to uncover the direct, indirect, femoral and obturator spaces.  Cooper's ligament was uncovered medially and the psoas muscle uncovered laterally.  The mesh, as documented above, was opened and advanced into the pre peritoneal position so that it more than adequately covered the indirect, direct, femoral and obturator spaces.  The mesh laid flat, with no inferior folds and covered the entire myopectineal orifice.  The mesh was fixated with the endo-universal hernia stapler to Cooper's ligament and the posterior aspect of the rectus muscle.  The peritoneal flap was closed with the same device.  There were no peritoneal defects or exposed mesh at the conclusion.  The umbilical trocar was removed and the umbilical hernia fascial defect was closed with a 0 Vicryl suture.  The peritoneal cavity was completely desufflated, the trocars removed and the skin closed with 4-0 Monocryl subcuticular suture and skin glue.  All sponge and needle counts were correct at the end of the case.  Ivar Drape, MD General, Bariatric, & Minimally Invasive Surgery [facility], [place]

## 2022-06-02 ENCOUNTER — Encounter (HOSPITAL_COMMUNITY): Payer: Self-pay | Admitting: Surgery

## 2022-06-06 DIAGNOSIS — J449 Chronic obstructive pulmonary disease, unspecified: Secondary | ICD-10-CM | POA: Diagnosis not present

## 2022-06-09 ENCOUNTER — Inpatient Hospital Stay: Payer: PPO | Attending: Oncology | Admitting: Oncology

## 2022-06-09 VITALS — BP 124/75 | HR 84 | Temp 98.1°F | Resp 20 | Ht 71.0 in | Wt 194.6 lb

## 2022-06-09 DIAGNOSIS — I2510 Atherosclerotic heart disease of native coronary artery without angina pectoris: Secondary | ICD-10-CM | POA: Insufficient documentation

## 2022-06-09 DIAGNOSIS — Z85831 Personal history of malignant neoplasm of soft tissue: Secondary | ICD-10-CM | POA: Diagnosis not present

## 2022-06-09 DIAGNOSIS — Z8572 Personal history of non-Hodgkin lymphomas: Secondary | ICD-10-CM | POA: Insufficient documentation

## 2022-06-09 DIAGNOSIS — C8290 Follicular lymphoma, unspecified, unspecified site: Secondary | ICD-10-CM | POA: Diagnosis not present

## 2022-06-09 DIAGNOSIS — E785 Hyperlipidemia, unspecified: Secondary | ICD-10-CM | POA: Insufficient documentation

## 2022-06-09 NOTE — Progress Notes (Signed)
   [facility] OFFICE PROGRESS NOTE   Diagnosis: Liposarcoma, nonhospital,  INTERVAL HISTORY:   Mr Joseph Soto returns for scheduled visit.  He feels well.  Good appetite.  No fever or night sweats.  No palpable lymph nodes.  He reports an episode of angina approximately 3 weeks ago.  He underwent upper scopic bilateral inguinal hernia repair and an open umbilical hernia repair on 06/01/2022.  Objective:  Vital signs in last 24 hours:  Blood pressure 124/75, pulse 84, temperature 98.1 F (36.7 C), temperature source Oral, resp. rate 20, height 5\' 11"  (1.803 m), weight 194 lb 9.6 oz (88.3 kg), SpO2 98 %.    Lymphatics: No cervical, supraclavicular, axillary, or inguinal nodes Resp: Lungs clear bilaterally Cardio: Regular rate and rhythm GI: No hepatosplenomegaly, healing surgical incisions, smooth firm fullness deep to and ecchymosis at the left medial groin Vascular: No leg edema  Skin: Anterior chest scar without evidence of recurrent tumor   Lab Results:  Lab Results  Component Value Date   WBC 8.6 05/21/2022   HGB 12.3 (L) 05/21/2022   HCT 38.3 (L) 05/21/2022   MCV 93.0 05/21/2022   PLT 285 05/21/2022   NEUTROABS 8.4 (H) 12/20/2015    CMP  Lab Results  Component Value Date   NA 138 05/21/2022   K 4.1 05/21/2022   CL 108 05/21/2022   CO2 25 05/21/2022   GLUCOSE 94 05/21/2022   BUN 19 05/21/2022   CREATININE 1.32 (H) 05/21/2022   CALCIUM 8.8 (L) 05/21/2022   PROT 6.7 01/26/2019   ALBUMIN 3.9 01/26/2019   AST 40 (H) 01/26/2019   ALT 32 01/26/2019   ALKPHOS 60 01/26/2019   BILITOT 0.5 01/26/2019   GFRNONAA 55 (L) 05/21/2022   GFRAA 65 06/07/2019     Medications: I have reviewed the patient's current medications.   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   History of coronary artery disease/myocardial infarction-status post coronary artery bypass surgery in 2007, status post placement of a RCA stent May 2018    4.   Hyperlipidemia   5.  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  He will return for an office visit in 9 months.  Thornton Papas, MD  06/09/2022  11:44 AM

## 2022-06-12 DIAGNOSIS — H524 Presbyopia: Secondary | ICD-10-CM | POA: Diagnosis not present

## 2022-06-12 DIAGNOSIS — H25811 Combined forms of age-related cataract, right eye: Secondary | ICD-10-CM | POA: Diagnosis not present

## 2022-07-02 ENCOUNTER — Encounter: Payer: Self-pay | Admitting: Cardiovascular Disease

## 2022-07-06 DIAGNOSIS — J449 Chronic obstructive pulmonary disease, unspecified: Secondary | ICD-10-CM | POA: Diagnosis not present

## 2022-08-06 DIAGNOSIS — J449 Chronic obstructive pulmonary disease, unspecified: Secondary | ICD-10-CM | POA: Diagnosis not present

## 2022-08-07 ENCOUNTER — Encounter: Payer: Self-pay | Admitting: Cardiovascular Disease

## 2022-08-20 DIAGNOSIS — Z85828 Personal history of other malignant neoplasm of skin: Secondary | ICD-10-CM | POA: Diagnosis not present

## 2022-08-20 DIAGNOSIS — L218 Other seborrheic dermatitis: Secondary | ICD-10-CM | POA: Diagnosis not present

## 2022-08-21 DIAGNOSIS — M545 Low back pain, unspecified: Secondary | ICD-10-CM | POA: Diagnosis not present

## 2022-08-24 DIAGNOSIS — M545 Low back pain, unspecified: Secondary | ICD-10-CM | POA: Diagnosis not present

## 2022-08-27 DIAGNOSIS — M545 Low back pain, unspecified: Secondary | ICD-10-CM | POA: Diagnosis not present

## 2022-09-05 DIAGNOSIS — J449 Chronic obstructive pulmonary disease, unspecified: Secondary | ICD-10-CM | POA: Diagnosis not present

## 2022-09-13 ENCOUNTER — Other Ambulatory Visit: Payer: Self-pay | Admitting: Cardiovascular Disease

## 2022-09-15 DIAGNOSIS — M545 Low back pain, unspecified: Secondary | ICD-10-CM | POA: Diagnosis not present

## 2022-09-21 ENCOUNTER — Other Ambulatory Visit: Payer: Self-pay

## 2022-09-21 DIAGNOSIS — M545 Low back pain, unspecified: Secondary | ICD-10-CM | POA: Diagnosis not present

## 2022-09-21 MED ORDER — METOPROLOL SUCCINATE ER 25 MG PO TB24: 12.5000 mg | ORAL_TABLET | Freq: Two times a day (BID) | ORAL | 2 refills | Status: DC

## 2022-09-21 NOTE — Telephone Encounter (Signed)
Pt's medication was sent to pt's pharmacy as requested. Confirmation received.  °

## 2022-09-23 DIAGNOSIS — H25811 Combined forms of age-related cataract, right eye: Secondary | ICD-10-CM | POA: Diagnosis not present

## 2022-09-23 DIAGNOSIS — H268 Other specified cataract: Secondary | ICD-10-CM | POA: Diagnosis not present

## 2022-10-06 DIAGNOSIS — J449 Chronic obstructive pulmonary disease, unspecified: Secondary | ICD-10-CM | POA: Diagnosis not present

## 2022-10-07 DIAGNOSIS — M545 Low back pain, unspecified: Secondary | ICD-10-CM | POA: Diagnosis not present

## 2022-10-18 NOTE — Progress Notes (Unsigned)
No chief complaint on file.   History of Present Illness: 77 yo male with history of CAD s/p CABG 2007, Non-hodgkins Lymphoma, ischemic cardiomyopathy and HLD here today for cardiac follow up. He had a 4V CABG in 2007. Cath in 2010 showed occluded SVG to Diagonal and occluded SVG to PDA/PLA with patent LIMA graft to the  LAD. There was diffuse disease in distal vessels felt best to be managed medically. 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 Echo April 2018 with LVEF=40-45% and hypokinesis of the anteroseptal wall and apex. Nuclear stress test April 2018 with possible ischemia. Cardiac cath 06/17/16 with patent LIMA to LAD, all other grafts known to be occluded. Severe stenosis in the proximal RCA treated with a drug eluting stent. He was seen in our office August 2019 by Robbie Lis, PA-C with c/o dyspnea with exertion. Cardiac monitor with sinus rhythm, rare PVCs, rare PACs. Nuclear stress test September 2019 without ischemia. Echo September 2019 with LVEF=40%, trivial MR. He was tried on Cozaar in October 2019 but did not tolerate due to dizziness and presumed hypotension.  He was seen in April 2021 and reported dyspnea with exertion. Cardiac cath 06/14/19 showed patent LIMA to LAD with known occlusion of the mid LAD, patent RCA stent. There was a moderate left main stenosis and severe proximal Circumflex stenosis in a moderate caliber vessel. I attempted PCI of the Circumflex which was difficult. Balloon angioplasty of the ostial Circumflex with no stent placement as I could not effectively expand the lesion due to tortuosity and calcification. The vessel was not felt to be favorable for further attempts at PCI given difficult anatomy. Plans for medical management of CAD and possible referral for redo CABG if his symptoms could not be controlled with medical therapy. He has started cyclodextrin which he found online with independent research and is hopeful that this  will reverse some of his CAD. This is approved for use in [place]. Echo June 2023 with LVEF=55-60%. No valve disease. He continued to have dyspnea and was seen by Pulmonary and was felt to have mild COD and sleep apnea. Repeat cardiac cath in January 2024 with no change in coronary anatomy, only patent graft was the LIMA to LAD. His left main and Circumflex are not amenable to PCI. He was referred to see Dr. Dorris Fetch in CT surgery to discuss redo bypass. He elected not to proceed with bypass.   He is here today for follow up. The patient denies any chest pain, dyspnea, palpitations, lower extremity edema, orthopnea, PND, dizziness, near syncope or syncope.   Primary Care Physician: Garlan Fillers, MD  Past Medical History:  Diagnosis Date   Allergic rhinitis    grass, dust   Buttock pain    CAD (coronary artery disease)    Last cath 2010 per Dr. Deborah Chalk with diffuse multivessel disease, small caliber vessels not felt to be suitable for PCI   Chronic left hip pain    Colon polyps    pt says a colonoscopy did not confirm this, said there was nothing there   Diverticulosis of sigmoid colon 2010   colonoscopy - [facility]   Dizziness    1 episode    Fibromyalgia    GERD (gastroesophageal reflux disease)    Head pain    "not chronic; I have myofasial tightening in my head that causes pain in my skull" (06/17/2016)   HLD (hyperlipidemia)    Hypertension  Leiomyosarcoma of chest wall (HCC) 11/2015   surgical excision alone recommended (per notes from Dr. Silvano Rusk office)   [facility])    "found it in my chest when they did the excision"   Myocardial infarction (HCC) 06/2005   Non Hodgkin's lymphoma (HCC) dx'd 06/2005    Past Surgical History:  Procedure Laterality Date   BALLOON SINUPLASTY  2014   CARDIAC CATHETERIZATION  ~ 2008   "Dr. Deborah Chalk"   CATARACT EXTRACTION W/ INTRAOCULAR LENS IMPLANT Right    colonscopy  2010   CORONARY ANGIOPLASTY WITH STENT PLACEMENT   06/17/2016   CORONARY ARTERY BYPASS GRAFT  5/07   x5. LV dysfunction.    CORONARY BALLOON ANGIOPLASTY N/A 06/14/2019   Procedure: CORONARY BALLOON ANGIOPLASTY;  Surgeon: Kathleene Hazel, MD;  [facility]: [facility];  Service: Cardiovascular;  Laterality: N/A;   CORONARY STENT INTERVENTION N/A 06/17/2016   Procedure: Coronary Stent Intervention;  Surgeon: Kathleene Hazel, MD;  Location: [facility];  Service: Cardiovascular;  Laterality: N/A;   epidural steroid injections     INGUINAL HERNIA REPAIR Bilateral 06/01/2022   Procedure: LAPAROSCOPIC BILATERAL INGUINAL HERNIA REPAIR WITH MESH;  Surgeon: Stechschulte, Hyman Hopes, MD;  Location: [facility];  Service: General;  Laterality: Bilateral;   LAPAROSCOPIC CHOLECYSTECTOMY  1996   LEFT HEART CATH AND CORS/GRAFTS ANGIOGRAPHY N/A 06/17/2016   Procedure: Left Heart Cath and Cors/Grafts Angiography;  Surgeon: Kathleene Hazel, MD;  Location: [facility];  Service: Cardiovascular;  Laterality: N/A;   LEFT HEART CATH AND CORS/GRAFTS ANGIOGRAPHY N/A 06/14/2019   Procedure: LEFT HEART CATH AND CORS/GRAFTS ANGIOGRAPHY;  Surgeon: Kathleene Hazel, MD;  Location: [facility];  Service: Cardiovascular;  Laterality: N/A;   LEFT HEART CATH AND CORS/GRAFTS ANGIOGRAPHY N/A 02/26/2022   Procedure: LEFT HEART CATH AND CORS/GRAFTS ANGIOGRAPHY;  Surgeon: Kathleene Hazel, MD;  Location: [facility];  Service: Cardiovascular;  Laterality: N/A;   MASS EXCISION N/A 11/11/2015   Procedure: EXCISION OF MID CHEST  MASS;  Surgeon: Jimmye Norman, MD;  Location: [facility];  Service: General;  Laterality: N/A;   MASS EXCISION N/A 12/23/2015   Procedure: REEXCISION OF CHEST WALL TUMOR SITE;  Surgeon: Jimmye Norman, MD;  Location: [facility];  Service: General;  Laterality: N/A;   POPLITEAL SYNOVIAL CYST EXCISION Right    TONSILLECTOMY  1952   & Adenoidectomy   UMBILICAL HERNIA REPAIR N/A 06/01/2022    Procedure: OPEN PRIMARY UMBILICAL HERNIA REPAIR;  Surgeon: Quentin Ore, MD;  Location: [facility];  Service: General;  Laterality: N/A;    Current Outpatient Medications  Medication Sig Dispense Refill   acetaminophen (TYLENOL) 500 MG tablet Take 500-1,000 mg by mouth every 6 (six) hours as needed (pain.).     albuterol (VENTOLIN HFA) 108 (90 Base) MCG/ACT inhaler Inhale into the lungs every 6 (six) hours as needed for wheezing or shortness of breath. (Patient not taking: Reported on 06/09/2022)     amLODipine (NORVASC) 5 MG tablet Take 5 mg by mouth at bedtime.     aspirin EC 81 MG tablet Take 81 mg by mouth at bedtime.     BREZTRI AEROSPHERE 160-9-4.8 MCG/ACT AERO Inhale 2 puffs into the lungs in the morning and at bedtime. (Patient taking differently: Inhale 2 puffs into the lungs in the morning and at bedtime. 01 am & 1300) 10.7 g 11   cholecalciferol (VITAMIN D) 1000 units tablet Take 2,000 Units by mouth daily  with breakfast.     clopidogrel (PLAVIX) 75 MG tablet TAKE 1 TABLET BY MOUTH EVERY DAY WITH BREAKFAST 90 tablet 3   Coenzyme Q10 300 MG CAPS Take 300 mg by mouth in the morning and at bedtime.     cyclobenzaprine (FLEXERIL) 10 MG tablet Take 10 mg by mouth 2 (two) times daily.     ezetimibe (ZETIA) 10 MG tablet Take 1 tablet (10 mg total) by mouth daily. 90 tablet 3   fluticasone (FLONASE) 50 MCG/ACT nasal spray Place 1 spray into both nostrils daily as needed for rhinitis or allergies.     isosorbide mononitrate (IMDUR) 30 MG 24 hr tablet Take 1 tablet (30 mg total) by mouth daily. Please schedule yearly appointment for future refills. 1st attempt. Thank you (Patient taking differently: Take 15 mg by mouth daily. Please schedule yearly appointment for future refills. 1st attempt. Thank you) 45 tablet 0   metoprolol succinate (TOPROL-XL) 25 MG 24 hr tablet Take 0.5 tablets (12.5 mg total) by mouth 2 (two) times daily. 90 tablet 2   nitroGLYCERIN (NITROSTAT) 0.4 MG SL tablet Place  1 tablet (0.4 mg total) under the tongue every 5 (five) minutes as needed for chest pain. 25 tablet 3   Olopatadine HCl (PATADAY) 0.2 % SOLN Apply to eye.     Omega-3 Fatty Acids (FISH OIL) 1200 MG CAPS Take 1,200 mg by mouth 2 (two) times daily.     oxyCODONE-acetaminophen (PERCOCET) 5-325 MG tablet Take 1 tablet by mouth every 4 (four) hours as needed for severe pain. 20 tablet 0   Polyvinyl Alcohol-Povidone PF (REFRESH) 1.4-0.6 % SOLN Place 1 drop into both eyes 4 (four) times daily.     rosuvastatin (CRESTOR) 40 MG tablet Take 1 tablet (40 mg total) by mouth daily. (Patient taking differently: Take 40 mg by mouth every evening.) 90 tablet 0   No current facility-administered medications for this visit.    Allergies  Allergen Reactions   Niacin And Related Nausea And Vomiting   Niaspan [Niacin Er] Nausea And Vomiting and Other (See Comments)    Stomach intolerance   Ramipril     Other Reaction(s): cough    Social History   Socioeconomic History   Marital status: Married    Spouse name: Rosalita Chessman   Number of children: 2   Years of education: college   Highest education level: Not on file  Occupational History   Occupation: retired  Tobacco Use   Smoking status: Former    Current packs/day: 0.00    Average packs/day: 2.0 packs/day for 30.0 years (60.0 ttl pk-yrs)    Types: Cigarettes    Start date: 05/17/1970    Quit date: 05/16/2000    Years since quitting: 22.4   Smokeless tobacco: Never   Tobacco comments:    "quit 06/2005"  Vaping Use   Vaping status: Never Used  Substance and Sexual Activity   Alcohol use: Not Currently   Drug use: No   Sexual activity: Not on file  Other Topics Concern   Not on file  Social History Narrative   Married, 2 children.   Runs an executive recruiting company.    Left-handed.   Caffeine: iced tea, green tea; tea daily    Social Determinants of Health   Financial Resource Strain: Not on file  Food Insecurity: Not on file   Transportation Needs: Not on file  Physical Activity: Not on file  Stress: Not on file  Social Connections: Not on file  Intimate Partner  Violence: Not on file    Family History  Problem Relation Age of Onset   Heart disease Father    Heart attack Father    Congestive Heart Failure Father    COPD Father    Dementia Mother    Cancer Paternal Grandfather        ? type    Review of Systems:  As stated in the HPI and otherwise negative.   There were no vitals taken for this visit.  Physical Examination: General: Well developed, well nourished, NAD  HEENT: OP clear, mucus membranes moist  SKIN: warm, dry. No rashes. Neuro: No focal deficits  Musculoskeletal: Muscle strength 5/5 all ext  Psychiatric: Mood and affect normal  Neck: No JVD, no carotid bruits, no thyromegaly, no lymphadenopathy.  Lungs:Clear bilaterally, no wheezes, rhonci, crackles Cardiovascular: Regular rate and rhythm. No murmurs, gallops or rubs. Abdomen:Soft. Bowel sounds present. Non-tender.  Extremities: No lower extremity edema. Pulses are 2 + in the bilateral DP/PT.  Echo June 2023:   1. Left ventricular ejection fraction, by estimation, is 55 to 60%. The  left ventricle has normal function. The left ventricle demonstrates  regional wall motion abnormalities (see scoring diagram/findings for  description). Left ventricular diastolic  parameters are consistent with Grade I diastolic dysfunction (impaired  relaxation).   2. Right ventricular systolic function is normal. The right ventricular  size is normal. Tricuspid regurgitation signal is inadequate for assessing  PA pressure.   3. The mitral valve is grossly normal. No evidence of mitral valve  regurgitation. No evidence of mitral stenosis.   4. The aortic valve is tricuspid. Aortic valve regurgitation is not  visualized. No aortic stenosis is present.   5. The inferior vena cava is normal in size with greater than 50%  respiratory variability,  suggesting right atrial pressure of 3 mmHg.   EKG:  EKG is not *** ordered today. The ekg ordered today demonstrates   Recent Labs: 05/21/2022: BUN 19; Creatinine, Ser 1.32; Hemoglobin 12.3; Platelets 285; Potassium 4.1; Sodium 138   Lipid Panel Followed in primary care   Wt Readings from Last 3 Encounters:  06/09/22 88.3 kg  06/01/22 90.7 kg  05/21/22 90.7 kg    Assessment and Plan:   1. CAD s/p CABG with unstable angina: He has extensive CAD with no good targets for PCI by cath in 2024. He was seen by Dr. Dorris Fetch in the CT surgery office to discuss redo CABG but he elected not to proceed with this. It is not clear that his dyspnea is related to his CAD. Continue ASA, Plavix, statin, Imdur and beta blocker.   2. Hyperlipidemia: Lipids followed in primary care. LDL ***. Continue statin and Zetia.   3. Ischemic cardiomyopathy: LV function normal by echo in June 2023. He did not tolerate low dose Cozaar due to hypotension and dizziness. I suspect that he will not tolerate afterload reducing agents. Continue beta blocker.   Labs/ tests ordered today include:  No orders of the defined types were placed in this encounter.  Disposition:   F/U with me post cath  Signed, Verne Carrow, MD 10/18/2022 3:04 PM    [facility] [address] [place], [place], [place]  [address] Phone: [phone]; Fax: [phone]

## 2022-10-19 ENCOUNTER — Ambulatory Visit: Payer: PPO | Attending: Cardiovascular Disease | Admitting: Cardiovascular Disease

## 2022-10-19 ENCOUNTER — Encounter: Payer: Self-pay | Admitting: Cardiovascular Disease

## 2022-10-19 VITALS — BP 118/68 | HR 80 | Ht 71.0 in | Wt 193.2 lb

## 2022-10-19 DIAGNOSIS — I25708 Atherosclerosis of coronary artery bypass graft(s), unspecified, with other forms of angina pectoris: Secondary | ICD-10-CM | POA: Diagnosis not present

## 2022-10-19 DIAGNOSIS — E7800 Pure hypercholesterolemia, unspecified: Secondary | ICD-10-CM | POA: Diagnosis not present

## 2022-10-19 DIAGNOSIS — I255 Ischemic cardiomyopathy: Secondary | ICD-10-CM

## 2022-10-19 NOTE — Patient Instructions (Signed)

## 2022-10-23 DIAGNOSIS — R059 Cough, unspecified: Secondary | ICD-10-CM | POA: Diagnosis not present

## 2022-10-23 DIAGNOSIS — R0600 Dyspnea, unspecified: Secondary | ICD-10-CM | POA: Diagnosis not present

## 2022-10-23 DIAGNOSIS — B349 Viral infection, unspecified: Secondary | ICD-10-CM | POA: Diagnosis not present

## 2022-10-23 DIAGNOSIS — Z1152 Encounter for screening for COVID-19: Secondary | ICD-10-CM | POA: Diagnosis not present

## 2022-10-23 DIAGNOSIS — J309 Allergic rhinitis, unspecified: Secondary | ICD-10-CM | POA: Diagnosis not present

## 2022-10-23 DIAGNOSIS — I10 Essential (primary) hypertension: Secondary | ICD-10-CM | POA: Diagnosis not present

## 2022-10-23 DIAGNOSIS — G43909 Migraine, unspecified, not intractable, without status migrainosus: Secondary | ICD-10-CM | POA: Diagnosis not present

## 2022-10-23 DIAGNOSIS — J449 Chronic obstructive pulmonary disease, unspecified: Secondary | ICD-10-CM | POA: Diagnosis not present

## 2022-10-23 DIAGNOSIS — R0981 Nasal congestion: Secondary | ICD-10-CM | POA: Diagnosis not present

## 2022-11-06 DIAGNOSIS — J449 Chronic obstructive pulmonary disease, unspecified: Secondary | ICD-10-CM | POA: Diagnosis not present

## 2022-12-06 DIAGNOSIS — J449 Chronic obstructive pulmonary disease, unspecified: Secondary | ICD-10-CM | POA: Diagnosis not present

## 2022-12-10 ENCOUNTER — Other Ambulatory Visit: Payer: Self-pay | Admitting: Cardiovascular Disease

## 2022-12-16 ENCOUNTER — Telehealth: Payer: Self-pay | Admitting: Internal Medicine

## 2022-12-16 NOTE — Telephone Encounter (Signed)
ATC x1 unable to leave a voicemail phone rang and went to VM will try to contact patient later.

## 2022-12-16 NOTE — Telephone Encounter (Signed)
Patient is wondering if he should get the RSV Vaccine, Arexzy if he is experiencing shortness of breath. Please call and advise.

## 2022-12-17 NOTE — Telephone Encounter (Signed)
Lm x2 for patient.  Will close encounter per office protocol.   

## 2022-12-18 ENCOUNTER — Telehealth: Payer: Self-pay | Admitting: Internal Medicine

## 2022-12-18 ENCOUNTER — Encounter: Payer: Self-pay | Admitting: Internal Medicine

## 2022-12-18 DIAGNOSIS — J449 Chronic obstructive pulmonary disease, unspecified: Secondary | ICD-10-CM

## 2022-12-18 NOTE — Telephone Encounter (Signed)
Patient is trying to return missed call in reference to previous encounter:  Patient is wondering if he should get the RSV Vaccine, Arexzy if he is experiencing shortness of breath. Please call and advise.

## 2022-12-22 DIAGNOSIS — I10 Essential (primary) hypertension: Secondary | ICD-10-CM | POA: Diagnosis not present

## 2022-12-22 DIAGNOSIS — Z1212 Encounter for screening for malignant neoplasm of rectum: Secondary | ICD-10-CM | POA: Diagnosis not present

## 2022-12-22 DIAGNOSIS — E785 Hyperlipidemia, unspecified: Secondary | ICD-10-CM | POA: Diagnosis not present

## 2022-12-22 DIAGNOSIS — Z79899 Other long term (current) drug therapy: Secondary | ICD-10-CM | POA: Diagnosis not present

## 2022-12-22 DIAGNOSIS — Z125 Encounter for screening for malignant neoplasm of prostate: Secondary | ICD-10-CM | POA: Diagnosis not present

## 2022-12-22 DIAGNOSIS — I2510 Atherosclerotic heart disease of native coronary artery without angina pectoris: Secondary | ICD-10-CM | POA: Diagnosis not present

## 2022-12-22 NOTE — Telephone Encounter (Signed)
Please advise on Abrysvo vs Arexvy per patient request. Thank you!

## 2022-12-23 NOTE — Telephone Encounter (Signed)
I have placed the PFT order. PCCs please have the patient schedule an appt with Tammy once the PFT is scheduled. Thank you!

## 2022-12-24 NOTE — Telephone Encounter (Signed)
Charlott Holler, MD  to Joseph Pu Amato "[name]"      12/23/22 11:53 AM Hi [name], Sorry for the trouble in getting through to me. I think either vaccine would be appropriate for you. Currently the recommendation is a "one and done" for this vaccine. I am sorry you are still having issues with your breathing. Probably worthwhile to see if there is any worsening in your lung function to see if that explains the shortness of breath you are still experiencing. Difficult to differentiate between that and the heart disease. I will have our office reach out to you with breathing testing and a follow up appointment.    Dr Celine Mans

## 2022-12-29 DIAGNOSIS — Z0000 Encounter for general adult medical examination without abnormal findings: Secondary | ICD-10-CM | POA: Diagnosis not present

## 2022-12-29 DIAGNOSIS — E785 Hyperlipidemia, unspecified: Secondary | ICD-10-CM | POA: Diagnosis not present

## 2022-12-29 DIAGNOSIS — G4734 Idiopathic sleep related nonobstructive alveolar hypoventilation: Secondary | ICD-10-CM | POA: Diagnosis not present

## 2022-12-29 DIAGNOSIS — Z8572 Personal history of non-Hodgkin lymphomas: Secondary | ICD-10-CM | POA: Diagnosis not present

## 2022-12-29 DIAGNOSIS — J310 Chronic rhinitis: Secondary | ICD-10-CM | POA: Diagnosis not present

## 2022-12-29 DIAGNOSIS — F5104 Psychophysiologic insomnia: Secondary | ICD-10-CM | POA: Diagnosis not present

## 2022-12-29 DIAGNOSIS — R82998 Other abnormal findings in urine: Secondary | ICD-10-CM | POA: Diagnosis not present

## 2022-12-29 DIAGNOSIS — J439 Emphysema, unspecified: Secondary | ICD-10-CM | POA: Diagnosis not present

## 2022-12-29 DIAGNOSIS — I25708 Atherosclerosis of coronary artery bypass graft(s), unspecified, with other forms of angina pectoris: Secondary | ICD-10-CM | POA: Diagnosis not present

## 2022-12-29 DIAGNOSIS — I10 Essential (primary) hypertension: Secondary | ICD-10-CM | POA: Diagnosis not present

## 2023-03-03 DIAGNOSIS — M545 Low back pain, unspecified: Secondary | ICD-10-CM | POA: Diagnosis not present

## 2023-03-06 DIAGNOSIS — M545 Low back pain, unspecified: Secondary | ICD-10-CM | POA: Diagnosis not present

## 2023-03-08 DIAGNOSIS — J449 Chronic obstructive pulmonary disease, unspecified: Secondary | ICD-10-CM | POA: Diagnosis not present

## 2023-03-09 ENCOUNTER — Telehealth: Payer: Self-pay | Admitting: Internal Medicine

## 2023-03-09 NOTE — Telephone Encounter (Signed)
PT calling sees no benefit  w/his 02 Concentrator and he wonders if Dr. Would agree with her on canceling it. Pls call @ [phone]

## 2023-03-10 DIAGNOSIS — M545 Low back pain, unspecified: Secondary | ICD-10-CM | POA: Diagnosis not present

## 2023-03-10 NOTE — Telephone Encounter (Signed)
Spoke to wife. Left detailed msg. States they will call back to sched appt. NFN

## 2023-03-11 ENCOUNTER — Inpatient Hospital Stay: Payer: PPO | Attending: Oncology | Admitting: Oncology

## 2023-03-11 VITALS — BP 115/70 | HR 87 | Temp 98.1°F | Resp 18 | Ht 71.0 in | Wt 196.0 lb

## 2023-03-11 DIAGNOSIS — Z8572 Personal history of non-Hodgkin lymphomas: Secondary | ICD-10-CM | POA: Insufficient documentation

## 2023-03-11 DIAGNOSIS — Z85831 Personal history of malignant neoplasm of soft tissue: Secondary | ICD-10-CM | POA: Diagnosis not present

## 2023-03-11 DIAGNOSIS — Z951 Presence of aortocoronary bypass graft: Secondary | ICD-10-CM | POA: Diagnosis not present

## 2023-03-11 DIAGNOSIS — C8290 Follicular lymphoma, unspecified, unspecified site: Secondary | ICD-10-CM | POA: Diagnosis not present

## 2023-03-11 DIAGNOSIS — Z955 Presence of coronary angioplasty implant and graft: Secondary | ICD-10-CM | POA: Insufficient documentation

## 2023-03-11 DIAGNOSIS — E785 Hyperlipidemia, unspecified: Secondary | ICD-10-CM | POA: Insufficient documentation

## 2023-03-11 DIAGNOSIS — I2510 Atherosclerotic heart disease of native coronary artery without angina pectoris: Secondary | ICD-10-CM | POA: Diagnosis not present

## 2023-03-11 NOTE — Progress Notes (Signed)
  [facility] OFFICE PROGRESS NOTE   Diagnosis: Leiomyosarcoma, non-Hodgkin's lymphoma  INTERVAL HISTORY:   Joseph Soto returns as scheduled.  He feels well.  No fever or night sweats.  No recent infection.  Good appetite.  He recently "pulled a muscle "at the left lower back.  The discomfort has resolved.  No other complaint.  Objective:  Vital signs in last 24 hours:  Blood pressure 115/70, pulse 87, temperature 98.1 F (36.7 C), temperature source Temporal, resp. rate 18, height 5\' 11"  (1.803 m), weight 196 lb (88.9 kg), SpO2 98%.     Lymphatics: No cervical, supraclavicular, axillary, or inguinal nodes Resp: Lungs clear bilaterally Cardio: Regular rate and rhythm GI: No hepatosplenomegaly, nontender, no mass Vascular: No leg edema  Skin: Anterior chest scar without evidence of recurrent tumor  Lab Results:  Lab Results  Component Value Date   WBC 8.6 05/21/2022   HGB 12.3 (L) 05/21/2022   HCT 38.3 (L) 05/21/2022   MCV 93.0 05/21/2022   PLT 285 05/21/2022   NEUTROABS 8.4 (H) 12/20/2015    CMP  Lab Results  Component Value Date   NA 138 05/21/2022   K 4.1 05/21/2022   CL 108 05/21/2022   CO2 25 05/21/2022   GLUCOSE 94 05/21/2022   BUN 19 05/21/2022   CREATININE 1.32 (H) 05/21/2022   CALCIUM 8.8 (L) 05/21/2022   PROT 6.7 01/26/2019   ALBUMIN 3.9 01/26/2019   AST 40 (H) 01/26/2019   ALT 32 01/26/2019   ALKPHOS 60 01/26/2019   BILITOT 0.5 01/26/2019   GFRNONAA 55 (L) 05/21/2022   GFRAA 65 06/07/2019     Medications: I have reviewed the patient's current medications.   Assessment/Plan: Leiomyosarcoma of the anterior chest wall, status post an excisional biopsy on 11/11/2015 confirming a leiomyosarcoma Positive lateral surgical margin, 2 x 0.6 cm, up to 5 mitoses per 10 high-powered fields Reexcision 12/23/2015-no leiomyosarcoma, negative resection margins    2.   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   History of coronary artery disease/myocardial infarction-status post coronary artery bypass surgery in 2007, status post placement of a RCA stent May 2018    4.   Hyperlipidemia   5.  Left high anterior cervical node first noted December 2019       Disposition:  Joseph Soto appears stable.  There is no clinical evidence of for recurrence of the leiomyosarcoma or progression of lymphoma.  He would like continue follow-up with the Cancer center.  He will return for an office visit in 1 year.  He will call in the interim for the development of palpable lymph nodes or new symptoms.  Thornton Papas, MD  03/11/2023  10:20 AM

## 2023-03-18 DIAGNOSIS — R058 Other specified cough: Secondary | ICD-10-CM | POA: Diagnosis not present

## 2023-03-18 DIAGNOSIS — J069 Acute upper respiratory infection, unspecified: Secondary | ICD-10-CM | POA: Diagnosis not present

## 2023-03-18 DIAGNOSIS — J439 Emphysema, unspecified: Secondary | ICD-10-CM | POA: Diagnosis not present

## 2023-03-18 DIAGNOSIS — R0981 Nasal congestion: Secondary | ICD-10-CM | POA: Diagnosis not present

## 2023-04-04 ENCOUNTER — Other Ambulatory Visit: Payer: Self-pay | Admitting: Cardiovascular Disease

## 2023-04-14 DIAGNOSIS — J449 Chronic obstructive pulmonary disease, unspecified: Secondary | ICD-10-CM | POA: Diagnosis not present

## 2023-04-20 ENCOUNTER — Ambulatory Visit: Admitting: Internal Medicine

## 2023-04-20 ENCOUNTER — Encounter: Payer: Self-pay | Admitting: Internal Medicine

## 2023-04-20 VITALS — BP 130/80 | HR 78 | Temp 97.7°F | Ht 71.0 in | Wt 195.8 lb

## 2023-04-20 DIAGNOSIS — J9611 Chronic respiratory failure with hypoxia: Secondary | ICD-10-CM

## 2023-04-20 DIAGNOSIS — Z951 Presence of aortocoronary bypass graft: Secondary | ICD-10-CM | POA: Diagnosis not present

## 2023-04-20 DIAGNOSIS — I2510 Atherosclerotic heart disease of native coronary artery without angina pectoris: Secondary | ICD-10-CM | POA: Diagnosis not present

## 2023-04-20 DIAGNOSIS — G4733 Obstructive sleep apnea (adult) (pediatric): Secondary | ICD-10-CM

## 2023-04-20 DIAGNOSIS — Z87891 Personal history of nicotine dependence: Secondary | ICD-10-CM | POA: Diagnosis not present

## 2023-04-20 DIAGNOSIS — J432 Centrilobular emphysema: Secondary | ICD-10-CM

## 2023-04-20 DIAGNOSIS — J449 Chronic obstructive pulmonary disease, unspecified: Secondary | ICD-10-CM | POA: Diagnosis not present

## 2023-04-20 NOTE — Progress Notes (Signed)
 Joseph Soto    [id number]    [birth year]  Primary Care Physician:Paterson, Barry Dienes, MD Date of Appointment: 04/20/2023 Established Patient Visit  Chief complaint:   Chief Complaint  Patient presents with   Follow-up     Sob on exertion.Patient states that he also get short of breath when bending over.     HPI: Joseph Soto is a 78 y.o. man with COPD FEV1 77% on breztri and CAD s/p CABG on nocturnal oxygen. Home sleep apnea test which showed AHI 8.1/hr with severe oxygen desaturations likely attributed to chronic lung disease.   Interval Updates: Here for follow up for dyspnea.  A few weeks ago had respiratory virus that ran through the family that took several weeks for recovery. Since then he has been worn down, more short of breath. He is on trelegy inhaler one puff once daily. He does have an albuterol inhaler but does not feel it's helping. Denies wheezing.   Supposed to wear 2LNC nocturnally and stopped wearing it for a week and hasn't noticed a difference.    I have reviewed the patient's family social and past medical history and updated as appropriate.   Past Medical History:  Diagnosis Date   Allergic rhinitis    grass, dust   Buttock pain    CAD (coronary artery disease)    Last cath 2010 per Dr. 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 Silvano Rusk office)   [facility])     "found it in my chest when they did the excision"   Myocardial infarction (HCC) 06/2005   Non Hodgkin's lymphoma (HCC) dx'd 06/2005    Past Surgical History:  Procedure Laterality Date   BALLOON SINUPLASTY  2014   CARDIAC CATHETERIZATION  ~ 2008   "Dr. Deborah Chalk"   CATARACT EXTRACTION W/ INTRAOCULAR LENS IMPLANT Right    colonscopy  2010   CORONARY ANGIOPLASTY WITH STENT PLACEMENT  06/17/2016   CORONARY ARTERY BYPASS GRAFT  5/07   x5. LV dysfunction.    CORONARY BALLOON ANGIOPLASTY N/A 06/14/2019   Procedure: CORONARY BALLOON ANGIOPLASTY;  Surgeon: Kathleene Hazel, MD;  [facility];  Service: Cardiovascular;  Laterality: N/A;   CORONARY STENT INTERVENTION N/A 06/17/2016   Procedure: Coronary Stent Intervention;  Surgeon: Kathleene Hazel, MD;  [facility];  Service: Cardiovascular;  Laterality: N/A;   epidural steroid injections     INGUINAL HERNIA REPAIR Bilateral 06/01/2022   Procedure: LAPAROSCOPIC BILATERAL INGUINAL HERNIA REPAIR WITH MESH;  Surgeon: Stechschulte, Hyman Hopes, MD;  Location: [facility];  Service: General;  Laterality: Bilateral;   LAPAROSCOPIC CHOLECYSTECTOMY  1996   LEFT HEART CATH AND CORS/GRAFTS ANGIOGRAPHY N/A 06/17/2016   Procedure: Left Heart Cath and Cors/Grafts Angiography;  Surgeon: Kathleene Hazel,  MD;  [facility];  Service: Cardiovascular;  Laterality: N/A;   LEFT HEART CATH AND CORS/GRAFTS ANGIOGRAPHY N/A 06/14/2019   Procedure: LEFT HEART CATH AND CORS/GRAFTS ANGIOGRAPHY;  Surgeon: Kathleene Hazel, MD;  [facility];  Service: Cardiovascular;  Laterality: N/A;   LEFT HEART CATH AND CORS/GRAFTS ANGIOGRAPHY N/A 02/26/2022   Procedure: LEFT HEART CATH AND CORS/GRAFTS ANGIOGRAPHY;  Surgeon: Kathleene Hazel, MD;  [facility];  Service: Cardiovascular;  Laterality: N/A;   MASS EXCISION N/A 11/11/2015   Procedure: EXCISION OF MID CHEST  MASS;  Surgeon: Jimmye Norman, MD;  Location: [facility];  Service: General;  Laterality: N/A;   MASS EXCISION N/A 12/23/2015   Procedure: REEXCISION OF CHEST WALL TUMOR SITE;  Surgeon: Jimmye Norman, MD;  Location: [facility];  Service: General;  Laterality: N/A;   POPLITEAL SYNOVIAL CYST EXCISION Right    TONSILLECTOMY  1952   & Adenoidectomy   UMBILICAL HERNIA REPAIR N/A 06/01/2022   Procedure: OPEN PRIMARY UMBILICAL HERNIA REPAIR;  Surgeon: Quentin Ore, MD;  [facility];  Service: General;  Laterality: N/A;    Family History  Problem Relation Age of Onset   Heart disease Father    Heart attack Father    Congestive Heart Failure Father    COPD Father    Dementia Mother    Cancer Paternal Grandfather        ? type    Social History   Occupational History   Occupation: retired  Tobacco Use   Smoking status: Former    Current packs/day: 0.00    Average packs/day: 2.0 packs/day for 30.0 years (60.0 ttl pk-yrs)    Types: Cigarettes    Start date: 05/17/1970    Quit date: 05/16/2000    Years since quitting: 22.9   Smokeless tobacco: Never   Tobacco comments:    "quit 06/2005"  Vaping Use   Vaping status: Never Used  Substance and Sexual Activity   Alcohol use: Not Currently   Drug use: No   Sexual activity: Not on file     Physical Exam: Blood pressure 130/80, pulse 78, temperature 97.7 F (36.5 C), temperature source Oral, height 5\' 11"  (1.803 m), weight 195 lb 12.8 oz (88.8 kg), SpO2 95%.  Gen:      NAD Lungs:   ctab no wheezes or crackles CV:        RRR, no edema   Data Reviewed: Imaging: I have personally reviewed the chest xray March 2023 - hyperinflation and interstitial changes consistent with COPD.   PFTs:     Latest Ref Rng & Units 08/27/2021    3:41 PM 01/26/2014    3:47 PM  PFT Results  FVC-Pre L 3.98  6.05   FVC-Predicted Pre % 95  136   FVC-Post L 4.06  5.54   FVC-Predicted Post % 97  125   Pre FEV1/FVC % % 57  47   Post  FEV1/FCV % % 56  49   FEV1-Pre L 2.25  2.85   FEV1-Predicted Pre % 75  87   FEV1-Post L 2.25  2.73   DLCO uncorrected ml/min/mmHg 11.56  21.64   DLCO UNC% % 46  66   DLCO corrected ml/min/mmHg 11.56  21.64   DLCO COR %Predicted % 46  66   DLVA Predicted % 43  61   TLC L 8.74  10.32   TLC % Predicted % 123  146  RV % Predicted % 158  171    I have personally reviewed the patient's PFTs and mild airflow limitation with hyperinflation and air trapping.   Labs: Lab Results  Component Value Date   WBC 8.6 05/21/2022   HGB 12.3 (L) 05/21/2022   HCT 38.3 (L) 05/21/2022   MCV 93.0 05/21/2022   PLT 285 05/21/2022     Immunization status: Immunization History  Administered Date(s) Administered   Influenza Split 11/27/2019, 10/09/2022   Influenza, High Dose Seasonal PF 11/08/2016   Influenza, Quadrivalent, Recombinant, Inj, Pf 11/25/2017, 09/29/2018, 11/22/2019   Influenza-Unspecified 10/16/2010, 11/11/2015, 11/09/2017, 11/15/2020, 11/07/2021   PFIZER(Purple Top)SARS-COV-2 Vaccination 03/19/2019, 04/13/2019, 10/11/2019, 04/30/2020   Pfizer Covid-19 Vaccine Bivalent Booster 21yrs & up 10/30/2020   Pneumococcal Conjugate-13 02/09/2014   Pneumococcal Polysaccharide-23 02/10/2012   Respiratory Syncytial Virus Vaccine,Recomb Aduvanted(Arexvy) 10/09/2022   Zoster Recombinant(Shingrix) 11/20/2020    External Records Personally Reviewed: PCP  Assessment:  COPD mild FEV1 77%. Lung function has declined when compared to 2015.  Allergic rhinitis, controlled Mild OSA - AHI 8/hr with severe nocturnal desaturation CAD s/p CABG with stenosis not amenable to PCI - stable LVEF as of June 2023  Plan/Recommendations:  Spirometry with bronchodilator - schedule at the front desk  Overnight oximetry while wearing 2LNC to make sure you're wearing enough oxygen at night.  Your CT scan from 2014 shows severe emphysema. I think the most likely explanation is that your symptoms are worsening from  this due to age related lung changes. Let's get the tests above to confirm.   Continue trelegy for now. Continue albuterol inhaler as needed.   Return to Care: Return in about 2 months (around 06/20/2023).   Durel Salts, MD Pulmonary and Critical Care Medicine [facility] Office:[phone]

## 2023-04-20 NOTE — Patient Instructions (Signed)
 It was a pleasure to see you today!  Please schedule follow up scheduled with myself in 2 months.  If my schedule is not open yet, we will contact you with a reminder closer to that time. Please call [phone] if you haven't heard from [place] a month before, and always call us sooner if issues or concerns arise. You can also send [place] a message through MyChart, but but aware that this is not to be used for urgent issues and it may take up to 5-7 days to receive a reply. Please be aware that you will likely be able to view your results before I have a chance to respond to them. Please give [place] 5 business days to respond to any non-urgent results.    Before your next visit I would like you to have:  Spirometry with bronchodilator - schedule at the front desk  Overnight oximetry while wearing 2LNC to make sure you're wearing enough oxygen at night.  Your CT scan from 2014 shows severe emphysema. I think the most likely explanation is that your symptoms are worsening from this due to age related lung changes. Let's get the tests above to confirm.   Continue trelegy for now. Continue albuterol inhaler as needed.

## 2023-04-22 ENCOUNTER — Encounter: Payer: Self-pay | Admitting: Internal Medicine

## 2023-04-23 DIAGNOSIS — M545 Low back pain, unspecified: Secondary | ICD-10-CM | POA: Diagnosis not present

## 2023-04-30 ENCOUNTER — Other Ambulatory Visit (HOSPITAL_BASED_OUTPATIENT_CLINIC_OR_DEPARTMENT_OTHER): Payer: Self-pay | Admitting: Medical

## 2023-04-30 DIAGNOSIS — M25561 Pain in right knee: Secondary | ICD-10-CM | POA: Diagnosis not present

## 2023-04-30 DIAGNOSIS — M545 Low back pain, unspecified: Secondary | ICD-10-CM | POA: Diagnosis not present

## 2023-04-30 DIAGNOSIS — M79604 Pain in right leg: Secondary | ICD-10-CM

## 2023-05-03 ENCOUNTER — Ambulatory Visit (HOSPITAL_COMMUNITY)
Admission: RE | Admit: 2023-05-03 | Discharge: 2023-05-03 | Disposition: A | Discharge status: Home / Self Care | Source: Ambulatory Visit | Attending: Cardiology | Admitting: Cardiology

## 2023-05-03 DIAGNOSIS — M79604 Pain in right leg: Secondary | ICD-10-CM

## 2023-05-07 DIAGNOSIS — Z8572 Personal history of non-Hodgkin lymphomas: Secondary | ICD-10-CM | POA: Diagnosis not present

## 2023-05-07 DIAGNOSIS — R058 Other specified cough: Secondary | ICD-10-CM | POA: Diagnosis not present

## 2023-05-07 DIAGNOSIS — I10 Essential (primary) hypertension: Secondary | ICD-10-CM | POA: Diagnosis not present

## 2023-05-07 DIAGNOSIS — J449 Chronic obstructive pulmonary disease, unspecified: Secondary | ICD-10-CM | POA: Diagnosis not present

## 2023-05-07 DIAGNOSIS — B349 Viral infection, unspecified: Secondary | ICD-10-CM | POA: Diagnosis not present

## 2023-05-20 DIAGNOSIS — M170 Bilateral primary osteoarthritis of knee: Secondary | ICD-10-CM | POA: Diagnosis not present

## 2023-06-06 DIAGNOSIS — J449 Chronic obstructive pulmonary disease, unspecified: Secondary | ICD-10-CM | POA: Diagnosis not present

## 2023-06-09 DIAGNOSIS — R0902 Hypoxemia: Secondary | ICD-10-CM | POA: Diagnosis not present

## 2023-06-09 DIAGNOSIS — G4730 Sleep apnea, unspecified: Secondary | ICD-10-CM | POA: Diagnosis not present

## 2023-06-14 ENCOUNTER — Encounter: Payer: Self-pay | Admitting: Internal Medicine

## 2023-06-14 DIAGNOSIS — D23111 Other benign neoplasm of skin of right upper eyelid, including canthus: Secondary | ICD-10-CM | POA: Diagnosis not present

## 2023-06-14 DIAGNOSIS — T1502XA Foreign body in cornea, left eye, initial encounter: Secondary | ICD-10-CM | POA: Diagnosis not present

## 2023-06-14 DIAGNOSIS — S0502XA Injury of conjunctiva and corneal abrasion without foreign body, left eye, initial encounter: Secondary | ICD-10-CM | POA: Diagnosis not present

## 2023-06-14 DIAGNOSIS — H43812 Vitreous degeneration, left eye: Secondary | ICD-10-CM | POA: Diagnosis not present

## 2023-06-16 DIAGNOSIS — M25561 Pain in right knee: Secondary | ICD-10-CM | POA: Diagnosis not present

## 2023-06-16 DIAGNOSIS — M1711 Unilateral primary osteoarthritis, right knee: Secondary | ICD-10-CM | POA: Diagnosis not present

## 2023-06-17 DIAGNOSIS — S0502XA Injury of conjunctiva and corneal abrasion without foreign body, left eye, initial encounter: Secondary | ICD-10-CM | POA: Diagnosis not present

## 2023-06-17 DIAGNOSIS — T1502XA Foreign body in cornea, left eye, initial encounter: Secondary | ICD-10-CM | POA: Diagnosis not present

## 2023-06-23 ENCOUNTER — Encounter: Payer: Self-pay | Admitting: Internal Medicine

## 2023-06-23 DIAGNOSIS — M1711 Unilateral primary osteoarthritis, right knee: Secondary | ICD-10-CM | POA: Diagnosis not present

## 2023-06-29 ENCOUNTER — Other Ambulatory Visit: Payer: Self-pay | Admitting: Cardiovascular Disease

## 2023-06-30 ENCOUNTER — Ambulatory Visit (INDEPENDENT_AMBULATORY_CARE_PROVIDER_SITE_OTHER): Admitting: Internal Medicine

## 2023-06-30 ENCOUNTER — Encounter: Payer: Self-pay | Admitting: Internal Medicine

## 2023-06-30 ENCOUNTER — Ambulatory Visit: Admitting: Internal Medicine

## 2023-06-30 VITALS — BP 124/62 | HR 82 | Temp 97.7°F | Ht 70.0 in | Wt 192.0 lb

## 2023-06-30 DIAGNOSIS — J4489 Other specified chronic obstructive pulmonary disease: Secondary | ICD-10-CM

## 2023-06-30 DIAGNOSIS — G4733 Obstructive sleep apnea (adult) (pediatric): Secondary | ICD-10-CM | POA: Diagnosis not present

## 2023-06-30 DIAGNOSIS — Z955 Presence of coronary angioplasty implant and graft: Secondary | ICD-10-CM

## 2023-06-30 DIAGNOSIS — Z87891 Personal history of nicotine dependence: Secondary | ICD-10-CM

## 2023-06-30 DIAGNOSIS — J432 Centrilobular emphysema: Secondary | ICD-10-CM

## 2023-06-30 DIAGNOSIS — J439 Emphysema, unspecified: Secondary | ICD-10-CM

## 2023-06-30 DIAGNOSIS — J449 Chronic obstructive pulmonary disease, unspecified: Secondary | ICD-10-CM

## 2023-06-30 DIAGNOSIS — M1811 Unilateral primary osteoarthritis of first carpometacarpal joint, right hand: Secondary | ICD-10-CM | POA: Diagnosis not present

## 2023-06-30 DIAGNOSIS — I2510 Atherosclerotic heart disease of native coronary artery without angina pectoris: Secondary | ICD-10-CM

## 2023-06-30 DIAGNOSIS — M1711 Unilateral primary osteoarthritis, right knee: Secondary | ICD-10-CM | POA: Diagnosis not present

## 2023-06-30 DIAGNOSIS — M25561 Pain in right knee: Secondary | ICD-10-CM | POA: Diagnosis not present

## 2023-06-30 DIAGNOSIS — G4734 Idiopathic sleep related nonobstructive alveolar hypoventilation: Secondary | ICD-10-CM

## 2023-06-30 LAB — PULMONARY FUNCTION TEST
FEF 25-75 Post: 1.15 L/s
FEF 25-75 Pre: 0.87 L/s
FEF2575-%Change-Post: 32 %
FEF2575-%Pred-Post: 56 %
FEF2575-%Pred-Pre: 42 %
FEV1-%Change-Post: 13 %
FEV1-%Pred-Post: 77 %
FEV1-%Pred-Pre: 68 %
FEV1-Post: 2.26 L
FEV1-Pre: 2 L
FEV1FVC-%Change-Post: 7 %
FEV1FVC-%Pred-Pre: 73 %
FEV6-%Change-Post: 6 %
FEV6-%Pred-Post: 103 %
FEV6-%Pred-Pre: 97 %
FEV6-Post: 3.92 L
FEV6-Pre: 3.69 L
FEV6FVC-%Change-Post: 0 %
FEV6FVC-%Pred-Post: 105 %
FEV6FVC-%Pred-Pre: 104 %
FVC-%Change-Post: 5 %
FVC-%Pred-Post: 98 %
FVC-%Pred-Pre: 93 %
FVC-Post: 4 L
FVC-Pre: 3.79 L
Post FEV1/FVC ratio: 57 %
Post FEV6/FVC ratio: 98 %
Pre FEV1/FVC ratio: 53 %
Pre FEV6/FVC Ratio: 97 %

## 2023-06-30 MED ORDER — ALBUTEROL SULFATE (2.5 MG/3ML) 0.083% IN NEBU: 2.5000 mg | INHALATION_SOLUTION | Freq: Four times a day (QID) | RESPIRATORY_TRACT | 12 refills | Status: AC | PRN

## 2023-06-30 MED ORDER — ALBUTEROL SULFATE (2.5 MG/3ML) 0.083% IN NEBU: 2.5000 mg | INHALATION_SOLUTION | Freq: Four times a day (QID) | RESPIRATORY_TRACT | 12 refills | Status: DC | PRN

## 2023-06-30 NOTE — Progress Notes (Signed)
 Spirometry pre/post bronchodilator performed today

## 2023-06-30 NOTE — Progress Notes (Signed)
 Joseph Soto    [id number]    01-05-1946  Primary Care Physician:Paterson, Cris Dollar, MD Date of Appointment: 06/30/2023 Established Patient Visit  Chief complaint:   Chief Complaint  Patient presents with   Follow-up    Spirometry today,sob worse x 2 mths, dry cough when outside, no wheeze     HPI: Joseph Soto is a 78 y.o. man with COPD FEV1 77% on breztri  and CAD s/p CABG on nocturnal oxygen . Home sleep apnea test which showed AHI 8.1/hr with severe oxygen  desaturations likely attributed to chronic lung disease.   Interval Updates: Here for follow up for dyspnea.  Had repeat spirometry today which shows worsening  FEV1 68% of predicted which is a decline from 77% in 2023  He notes worsening dyspnea with minimal exertion. Walking on an incline. He is able to do all his ADLs normally, but cannot work in the yard for as long as used to be able to.  Using albuterol  as needed, at least 1-2 times/day.   No interval exacerbations or hospitalizations  Feels it is a time to make a change in inhaler therapy. I have reviewed the patient's family social and past medical history and updated as appropriate.   Past Medical History:  Diagnosis Date   Allergic rhinitis    grass, dust   Buttock pain    CAD (coronary artery disease)    Last cath 2010 per Dr. 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  Rosellen Conners office)   [facility])    "found it in my chest when they did the excision"   Myocardial infarction (HCC) 06/2005   Non Hodgkin's lymphoma (HCC) dx'd 06/2005    Past Surgical History:  Procedure Laterality Date   BALLOON SINUPLASTY  2014   CARDIAC CATHETERIZATION  ~ 2008   "Dr. Ollis Bi"   CATARACT EXTRACTION W/ INTRAOCULAR LENS IMPLANT Right    colonscopy  2010   CORONARY ANGIOPLASTY WITH STENT PLACEMENT  06/17/2016   CORONARY ARTERY BYPASS GRAFT  5/07   x5. LV dysfunction.    CORONARY BALLOON ANGIOPLASTY N/A 06/14/2019   Procedure: CORONARY BALLOON ANGIOPLASTY;  Surgeon: Odie Benne, MD;  [facility];  Service: Cardiovascular;  Laterality: N/A;   CORONARY STENT INTERVENTION N/A 06/17/2016   Procedure: Coronary Stent Intervention;  Surgeon: Odie Benne, MD;  [facility];  Service: Cardiovascular;  Laterality: N/A;   epidural steroid injections     INGUINAL HERNIA REPAIR Bilateral 06/01/2022   Procedure: LAPAROSCOPIC BILATERAL INGUINAL HERNIA REPAIR WITH MESH;  Surgeon: Stechschulte, Avon Boers, MD;  [facility]: [facility];  Service: General;  Laterality: Bilateral;   LAPAROSCOPIC CHOLECYSTECTOMY  1996   LEFT HEART CATH AND CORS/GRAFTS ANGIOGRAPHY N/A 06/17/2016   Procedure: Left Heart Cath  and Cors/Grafts Angiography;  Surgeon: Odie Benne, MD;  [facility]: [facility];  Service: Cardiovascular;  Laterality: N/A;   LEFT HEART CATH AND CORS/GRAFTS ANGIOGRAPHY N/A 06/14/2019   Procedure: LEFT HEART CATH AND CORS/GRAFTS ANGIOGRAPHY;  Surgeon: Odie Benne, MD;  [facility];  Service: Cardiovascular;  Laterality: N/A;   LEFT HEART CATH AND CORS/GRAFTS ANGIOGRAPHY N/A 02/26/2022   Procedure: LEFT HEART CATH AND CORS/GRAFTS ANGIOGRAPHY;  Surgeon: Odie Benne, MD;  [facility];  Service: Cardiovascular;  Laterality: N/A;   MASS EXCISION N/A 11/11/2015   Procedure:  EXCISION OF MID CHEST  MASS;  Surgeon: Jerryl Morin, MD;  Location: [facility];  Service: General;  Laterality: N/A;   MASS EXCISION N/A 12/23/2015   Procedure: REEXCISION OF CHEST WALL TUMOR SITE;  Surgeon: Jerryl Morin, MD;  Location: [facility];  Service: General;  Laterality: N/A;   POPLITEAL SYNOVIAL CYST EXCISION Right    TONSILLECTOMY  1952   & Adenoidectomy   UMBILICAL HERNIA REPAIR N/A 06/01/2022   Procedure: OPEN PRIMARY UMBILICAL HERNIA REPAIR;  Surgeon: Junie Olds, MD;  Location: [facility];  Service: General;  Laterality: N/A;    Family History  Problem Relation Age of Onset   Heart disease Father    Heart attack Father    Congestive Heart Failure Father    COPD Father    Dementia Mother    Cancer Paternal Grandfather        ? type    Social History   Occupational History   Occupation: retired  Tobacco Use   Smoking status: Former    Current packs/day: 0.00    Average packs/day: 2.0 packs/day for 30.0 years (60.0 ttl pk-yrs)    Types: Cigarettes    Start date: 05/17/1970    Quit date: 05/16/2000    Years since quitting: 23.1   Smokeless tobacco: Never   Tobacco comments:    "quit 06/2005"  Vaping Use   Vaping status: Never Used  Substance and Sexual Activity   Alcohol use: Not Currently   Drug use: No   Sexual activity: Not on file     Physical Exam: Blood pressure 124/62, pulse 82, temperature 97.7 F (36.5 C), temperature source Oral, height 5\' 10"  (1.778 m), weight 192 lb (87.1 kg), SpO2 94%.  Gen:      NAD Lungs:  diminished, breathing non labored no wheezes or crackles CV:       RRR no mrg, no peripheral edema   Data Reviewed: Imaging: I have personally reviewed the chest xray March 2023 - hyperinflation and interstitial changes consistent with COPD.   PFTs:     Latest Ref Rng & Units 06/30/2023    9:44 AM 08/27/2021    3:41 PM 01/26/2014    3:47 PM  PFT Results  FVC-Pre L 3.79  P 3.98  6.05   FVC-Predicted  Pre % 93  P 95  136   FVC-Post L 4.00  P 4.06  5.54   FVC-Predicted Post % 98  P 97  125   Pre FEV1/FVC % % 53  P 57  47   Post FEV1/FCV % % 57  P 56  49   FEV1-Pre L 2.00  P 2.25  2.85   FEV1-Predicted Pre % 68  P 75  87   FEV1-Post L 2.26  P 2.25  2.73   DLCO uncorrected ml/min/mmHg  11.56  21.64   DLCO UNC% %  46  66   DLCO corrected ml/min/mmHg  11.56  21.64   DLCO COR %Predicted %  46  66   DLVA Predicted %  43  61   TLC L  8.74  10.32   TLC % Predicted %  123  146   RV % Predicted %  158  171     P Preliminary result   I have personally reviewed the patient's PFTs and mild airflow limitation with hyperinflation and air trapping.   Labs: Lab Results  Component Value Date   WBC 8.6 05/21/2022   HGB 12.3 (L) 05/21/2022   HCT 38.3 (L) 05/21/2022   MCV 93.0 05/21/2022   PLT 285 05/21/2022   Lab Results  Component Value Date   NA 138 05/21/2022   K 4.1 05/21/2022   CO2 25 05/21/2022   GLUCOSE 94 05/21/2022   BUN 19 05/21/2022   CREATININE 1.32 (H) 05/21/2022   CALCIUM  8.8 (L) 05/21/2022   EGFR 55 (L) 02/16/2022   GFRNONAA 55 (L) 05/21/2022      Immunization status: Immunization History  Administered Date(s) Administered   Influenza Split 11/27/2019, 10/09/2022   Influenza, High Dose Seasonal PF 11/08/2016   Influenza, Quadrivalent, Recombinant, Inj, Pf 11/25/2017, 09/29/2018, 11/22/2019   Influenza-Unspecified 10/16/2010, 11/11/2015, 11/09/2017, 11/15/2020, 11/07/2021   PFIZER(Purple Top)SARS-COV-2 Vaccination 03/19/2019, 04/13/2019, 10/11/2019, 04/30/2020   Pfizer Covid-19 Vaccine Bivalent Booster 33yrs & up 10/30/2020   Pneumococcal Conjugate-13 02/09/2014   Pneumococcal Polysaccharide-23 02/10/2012   Respiratory Syncytial Virus Vaccine,Recomb Aduvanted(Arexvy) 10/09/2022   Rsv, Bivalent, Protein Subunit Rsvpref,pf Pattricia Bores) 05/11/2023   Zoster Recombinant(Shingrix) 11/20/2020    External Records Personally Reviewed: PCP  Assessment:  COPD moderate  FEV1 68%. Lung function has declined when compared to 2023 Allergic rhinitis, controlled Mild OSA - AHI 8/hr with severe nocturnal desaturation CAD s/p CABG with stenosis not amenable to PCI - stable LVEF as of June 2023 Nocturnal hypoxemia  Plan/Recommendations:  Spirometry shows decline in lung function over the last 2 years.   Continue wearing 2LNC oxygen  at night.   Continue trelegy for now. Continue albuterol  inhaler as needed. I am sending a nebulizer machine so you can also use albuterol  nebulizer treatments instead of the inhaler. This may feel more effective. I am also adding a new medication called Ohtuvayre  which is a nebulized medication twice daily to add to your trelegy.  We talked about pulmonary rehab at [place] when you are ready.   Return to Care: Return in about 4 months (around 10/31/2023).   Louie Rover, MD Pulmonary and Critical Care Medicine [facility] [phone]

## 2023-06-30 NOTE — Patient Instructions (Signed)
 Spirometry pre/post bronchodilator performed today

## 2023-06-30 NOTE — Telephone Encounter (Signed)
 Copied from CRM [id number]. Topic: General - Other >> Jun 30, 2023 11:29 AM Ilene Malling wrote: Reason for CRM: Patient [phone] states he gavet he nurse the wrong information. Patient did not have RSV vaccine in April 2025, it was a Covid vaccine in April 2025. FYI.

## 2023-06-30 NOTE — Patient Instructions (Signed)
 It was a pleasure to see you today!  Please schedule follow up with myself in 4 months.  If my schedule is not open yet, we will contact you with a reminder closer to that time. Please call [phone] if you haven't heard from us  a month before, and always call us  sooner if issues or concerns arise. You can also send us  a message through MyChart, but but aware that this is not to be used for urgent issues and it may take up to 5-7 days to receive a reply. Please be aware that you will likely be able to view your results before I have a chance to respond to them. Please give us  5 business days to respond to any non-urgent results.    Spirometry shows decline in lung function over the last 2 years.   Continue wearing 2LNC oxygen  at night.   Continue trelegy for now. Continue albuterol  inhaler as needed. I am sending a nebulizer machine so you can also use albuterol  nebulizer treatments instead of the inhaler. This may feel more effective. I am also adding a new medication called Ohtuvayre  which is a nebulized medication twice daily to add to your trelegy.  We talked about pulmonary rehab at [place] when you are ready.   I think you are doing great with your activity level at this time given your age and chronic heart and lung conditions. Continue to be active. I would expect that you are not able to do as much as you could 5 or 10 years ago. Some of this decline could also be age related.

## 2023-07-01 ENCOUNTER — Ambulatory Visit: Payer: Self-pay | Admitting: Adult Health

## 2023-07-01 ENCOUNTER — Telehealth: Payer: Self-pay

## 2023-07-01 DIAGNOSIS — J449 Chronic obstructive pulmonary disease, unspecified: Secondary | ICD-10-CM | POA: Diagnosis not present

## 2023-07-01 NOTE — Telephone Encounter (Signed)
 Received Ohtuvayre new start paperwork. Completed form and faxed with clinicals and insurance card copy to [employer]   Phone#: [phone] Fax#: [phone]   Chesley Mires, PharmD, MPH, BCPS, CPP Clinical Pharmacist (Rheumatology and Pulmonology)

## 2023-07-02 ENCOUNTER — Other Ambulatory Visit: Payer: Self-pay | Admitting: Cardiovascular Disease

## 2023-07-02 NOTE — Telephone Encounter (Signed)
 Copied from CRM [id number]. Topic: Clinical - Medication Question >> Jul 01, 2023  4:55 PM Joseph Soto wrote: Reason for CRM: patient is confused on what is the status of his ohtuvayre  prescription,please call patient to further discuss when will prescription be sent to patient pharmacy.

## 2023-07-06 DIAGNOSIS — J449 Chronic obstructive pulmonary disease, unspecified: Secondary | ICD-10-CM | POA: Diagnosis not present

## 2023-07-06 NOTE — Telephone Encounter (Signed)
 Received fax from [employer] with summary of benefits. Referral form for Ohtuvayre  received. Rx will be triaged to [facility].. Once benefits investigation completed, pharmacy will reach out the patient to schedule shipment. If medication is unaffordable, patient will need to express financial hardship to be referred back to [employer] for patient assistance program pre-screening.   Patient ID: [id number] Pharmacy phone: [phone] [facility] Phone#: [phone]

## 2023-07-08 ENCOUNTER — Encounter: Payer: Self-pay | Admitting: Internal Medicine

## 2023-07-08 NOTE — Telephone Encounter (Unsigned)
 Copied from CRM [id number]. Topic: Clinical - Medication Prior Auth >> Jul 07, 2023 10:36 AM Corean Deutscher wrote: Reason for CRM: Ebony with [facility] called regarding medication Ohtuvayre  and vios(nebulizer) for the patient. Avis Lemming stated both medications need a pre-authorization due to insurance and she can be contacted at [phone].

## 2023-07-08 NOTE — Telephone Encounter (Signed)
 Submitted a Prior Authorization request to [facility] [employer] for OHTUVAYRE  via CoverMyMeds. Will update once we receive a response.  Key: [id number]

## 2023-07-09 ENCOUNTER — Encounter: Payer: Self-pay | Admitting: Oncology

## 2023-07-09 ENCOUNTER — Telehealth: Payer: Self-pay | Admitting: *Deleted

## 2023-07-09 DIAGNOSIS — Z8572 Personal history of non-Hodgkin lymphomas: Secondary | ICD-10-CM | POA: Diagnosis not present

## 2023-07-09 DIAGNOSIS — Z1152 Encounter for screening for COVID-19: Secondary | ICD-10-CM | POA: Diagnosis not present

## 2023-07-09 DIAGNOSIS — R058 Other specified cough: Secondary | ICD-10-CM | POA: Diagnosis not present

## 2023-07-09 DIAGNOSIS — J449 Chronic obstructive pulmonary disease, unspecified: Secondary | ICD-10-CM | POA: Diagnosis not present

## 2023-07-09 DIAGNOSIS — R0981 Nasal congestion: Secondary | ICD-10-CM | POA: Diagnosis not present

## 2023-07-09 DIAGNOSIS — R5383 Other fatigue: Secondary | ICD-10-CM | POA: Diagnosis not present

## 2023-07-09 DIAGNOSIS — R6883 Chills (without fever): Secondary | ICD-10-CM | POA: Diagnosis not present

## 2023-07-09 DIAGNOSIS — J439 Emphysema, unspecified: Secondary | ICD-10-CM | POA: Diagnosis not present

## 2023-07-09 NOTE — Telephone Encounter (Signed)
 Joseph Soto asking for an appointment to be seen at suggestion of PCP. Past 3 days he has had intermittent chills (no sweats) with poor energy, just going up stairs exhausts him and gets SOB. Denies any GI or urinary bleeding. Appetite has diminished as well. Temp 97.5. No swollen lymph nodes. PCP started him on Prednisone  20 mg daily x 6 days with 1st dose today. He has not tested for COVID or flu (did have vaccines). He was instructed by this RN to get test kit for both and test this weekend. Will let NP know situation.

## 2023-07-12 NOTE — Telephone Encounter (Signed)
 Received notification from [employer] regarding a prior authorization for OHTUVAYRE . Authorization has been APPROVED from 07/09/2023 to 02/09/2024. Approval letter sent to scan center.  Authorization # [id number]  Geraldene Kleine, PharmD, MPH, BCPS, CPP Clinical Pharmacist (Rheumatology and Pulmonology)

## 2023-07-16 IMAGING — MR MR ABDOMEN WO/W CM MRCP
12 of 20 series · 26 of 48 positions shown · IV contrast (multihance)
Comparison: Ultrasound exam 08/21/2020

CLINICAL DATA: Dilated common bile duct on ultrasound exam.

EXAM:
MRI ABDOMEN WITHOUT AND WITH CONTRAST (INCLUDING MRCP)
TECHNIQUE: Multiplanar multisequence MR imaging of the abdomen was performed
both before and after the administration of intravenous contrast.
Heavily T2-weighted images of the biliary and pancreatic ducts were
obtained, and three-dimensional MRCP images were rendered by post
processing.
CONTRAST:  18mL MULTIHANCE GADOBENATE DIMEGLUMINE 529 MG/ML IV SOLN

[Series 3: cor haste · coronal · 5.0mm · 0.78mm/px · 2 of 38 slices shown]
[im 1/38]
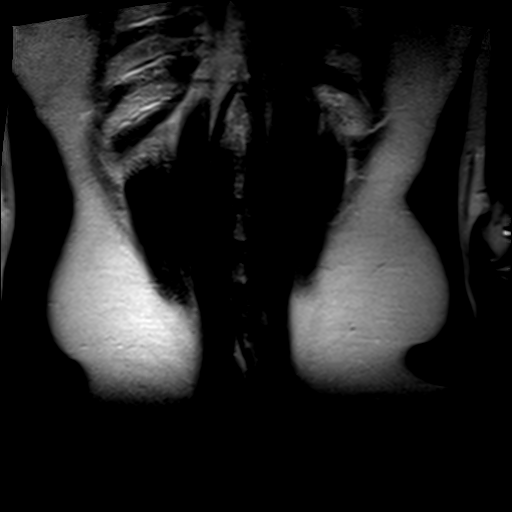
[im 38/38]
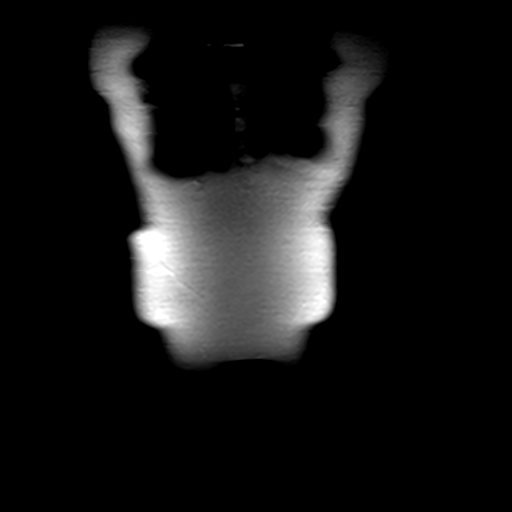

[Series 4: axial haste · axial · 6.0mm · 0.78mm/px · z∈[-158,+66]mm · 2 of 35 slices shown]
[im 1/35]
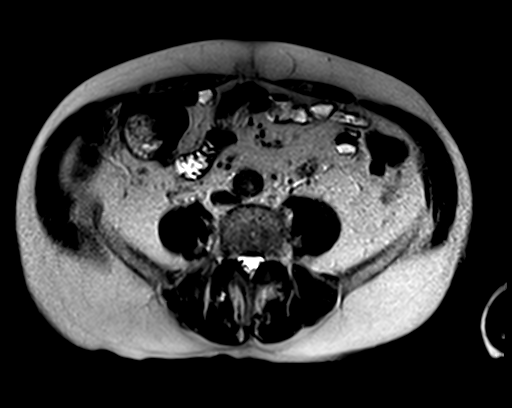
[im 35/35]
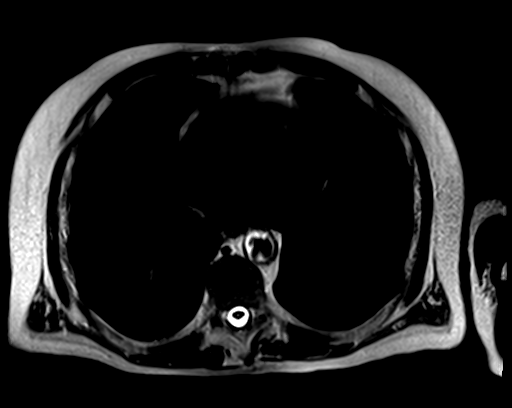

[Series 7: T2 · axial · 6.0mm · 1.12mm/px · 1 of 35 slices shown (1 of 2)]
[im 1/35]
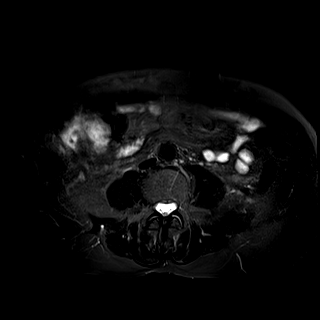

[Series 8: ep2d_[id number]_500_800_p2_trig · axial · 6.0mm · 1.98mm/px · z∈[-154,+98]mm · 4 of 108 slices shown]
[im 1/108]
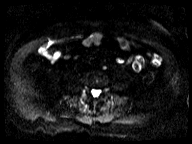
[im 36/108]
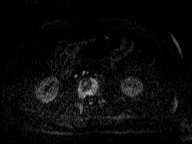
[im 72/108]
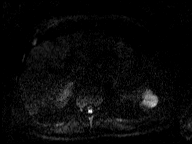
[im 108/108]
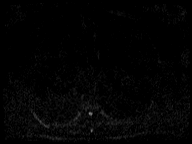

[Series 9: ep2d_[id number]_trig_adc · axial · 6.0mm · 1.98mm/px · 1 of 36 slices shown]
[im 1/36]
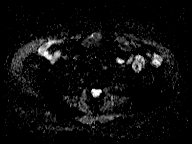

[Series 12: bSSFP · coronal · 5.0mm · 0.78mm/px · 1 of 34 slices shown (1 of 2)]
[im 1/34]
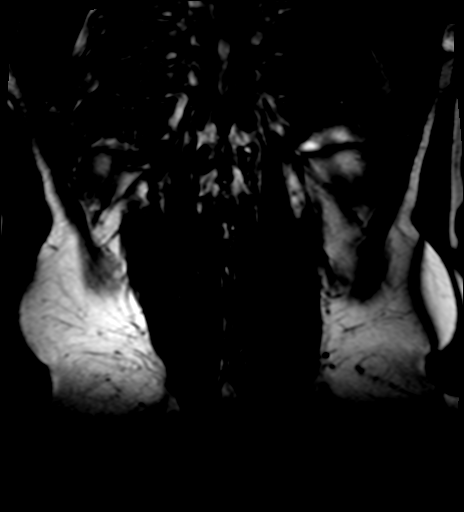

[Series 13: T2 · coronal · 3.0mm · 0.70mm/px · 2 of 58 slices shown (2 of 2)]
[im 1/58]
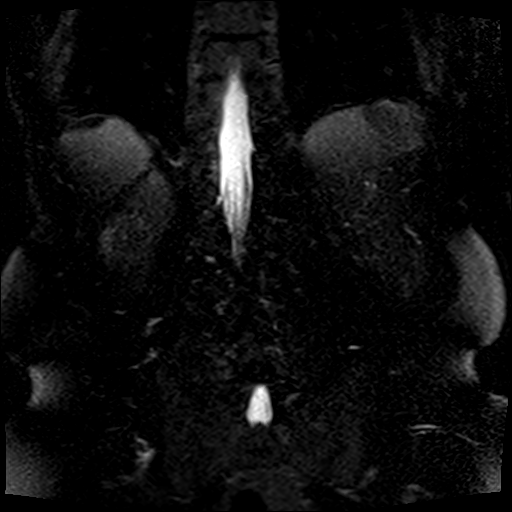
[im 58/58]
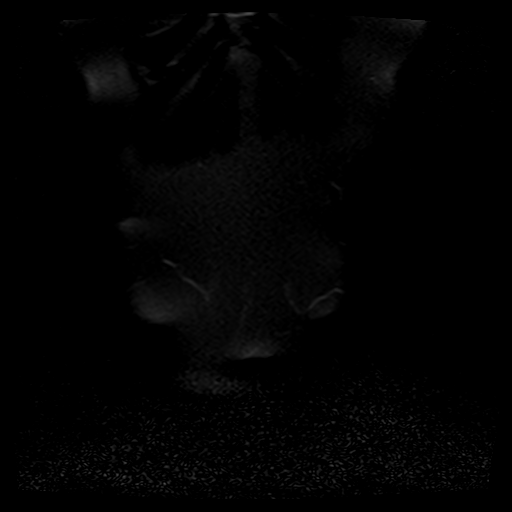

[Series 14: T1 · axial · 6.0mm · 0.74mm/px · z∈[-128,+63]mm · 2 of 60 slices shown]
[im 1/60]
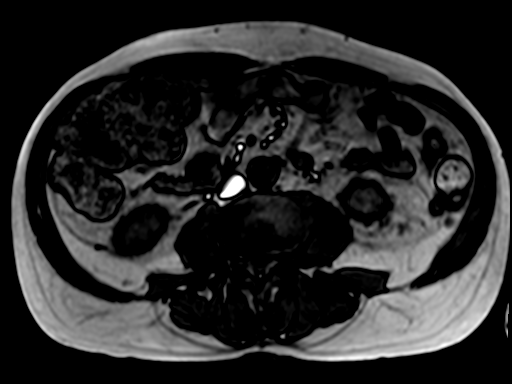
[im 60/60]
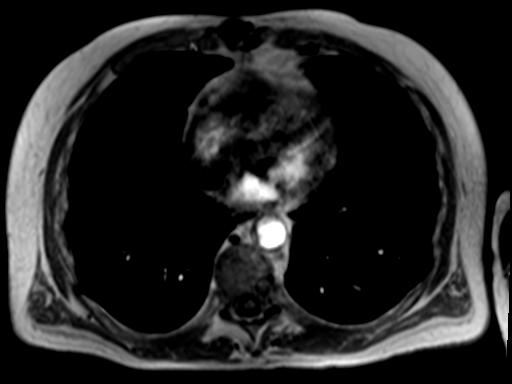

[Series 15: bSSFP · axial · 5.0mm · 0.74mm/px · z∈[-132,+68]mm · 2 of 41 slices shown (2 of 2)]
[im 1/41]
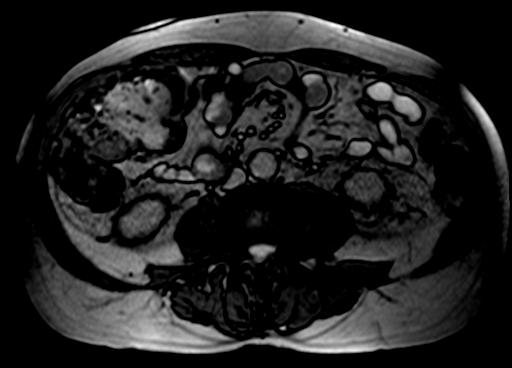
[im 41/41]
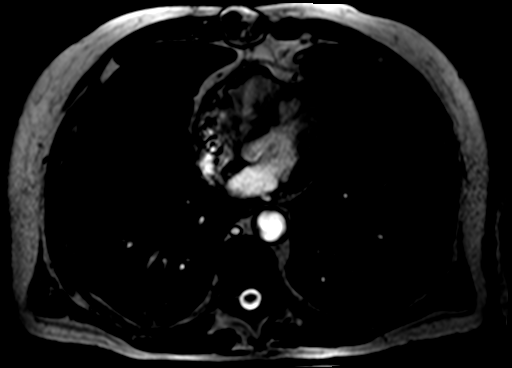

[Series 16: T1 dynamic · axial · non-contrast · 2.5mm · 0.74mm/px · z∈[-131,+66]mm · 3 of 80 slices shown]
[im 1/80]
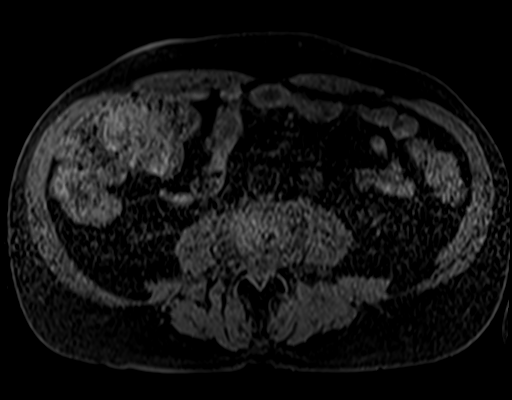
[im 40/80]
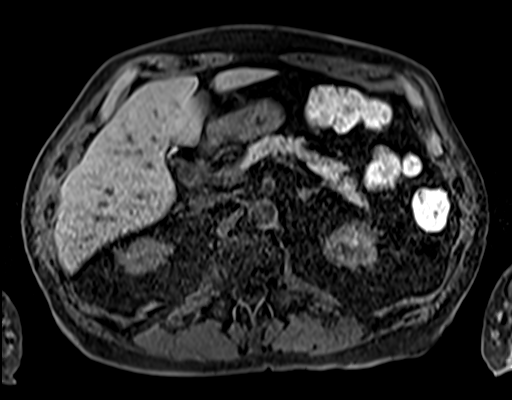
[im 80/80]
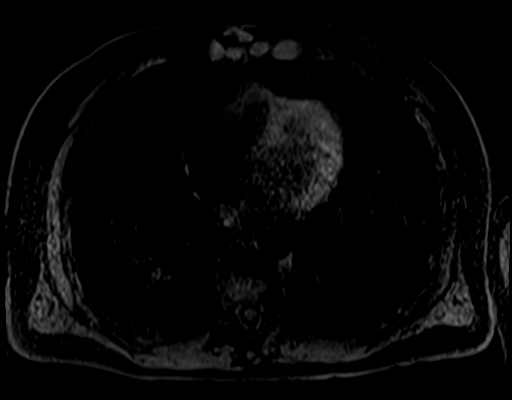

[Series 17: T1 dynamic post-contrast · axial · 2.5mm · 0.74mm/px · z∈[-131,+66]mm · 3 of 80 slices shown (1 of 2)]
[im 1/80]
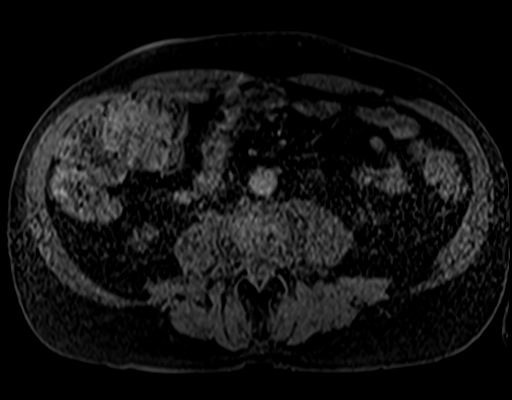
[im 40/80]
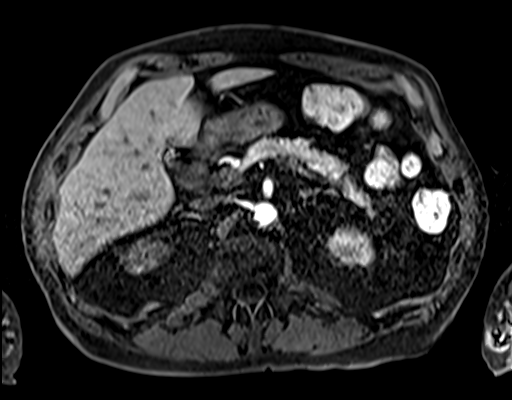
[im 80/80]
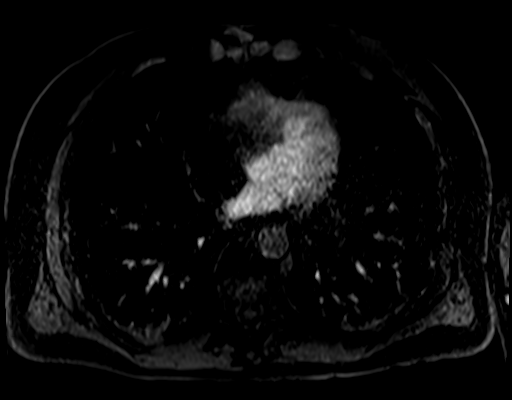

[Series 18: T1 dynamic post-contrast · axial · 2.5mm · 0.74mm/px · z∈[-131,+66]mm · 3 of 80 slices shown (2 of 2)]
[im 1/80]
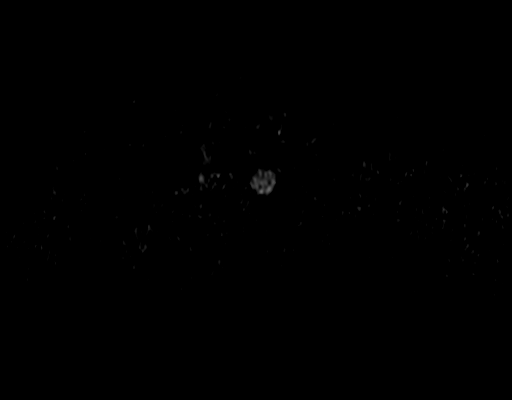
[im 40/80]
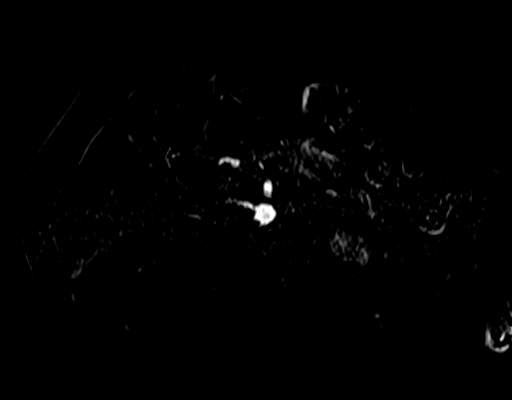
[im 80/80]
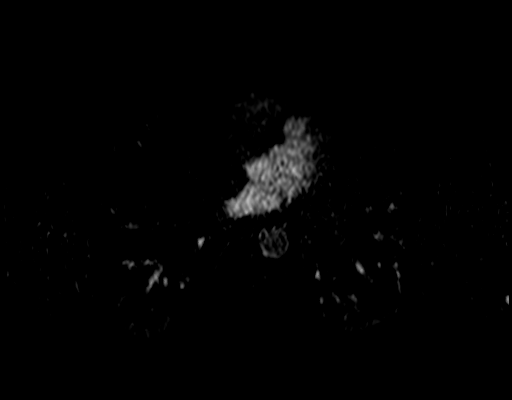

[26 of 48 positions shown; findings below may reference images not displayed]

FINDINGS: Lower chest: Unremarkable.

Hepatobiliary: No suspicious focal abnormality within the liver
parenchyma. Gallbladder surgically absent. Mild intrahepatic biliary
duct prominence is associated with extrahepatic biliary duct
dilatation. Common duct measures 13 mm in the hepatoduodenal
ligament. Common bile duct just proximal to the ampulla measures 10
mm. No choledocholithiasis. No obstructing mass lesion evident.

Pancreas: No mass, inflammatory changes, or other parenchymal
abnormality identified.

Spleen:  No splenomegaly. No focal mass lesion.

Adrenals/Urinary Tract: No adrenal nodule or mass. Kidneys
unremarkable.

Stomach/Bowel: Stomach is unremarkable. No gastric wall thickening.
No evidence of outlet obstruction. Duodenum is normally positioned
as is the ligament of Treitz. No small bowel or colonic dilatation
within the visualized abdomen.

Vascular/Lymphatic: Ulcerated plaque versus penetrating ulcer noted
infrarenal abdominal aorta measuring 1.6 x 1.3 cm

Other:  No intraperitoneal free fluid.

Musculoskeletal: Superior endplate compression deformity noted at L2
with some enhancement after IV contrast administration.
IMPRESSION: 1. Intra and extrahepatic biliary duct dilatation without evidence
for choledocholithiasis or obstructing mass lesion. No associated
pancreatic ductal dilatation. This may be related to prior
cholecystectomy or stricture/nonvisualized lesion at the ampulla.
ERCP may prove helpful to further evaluate.
2. Superior endplate compression deformity at L2 with some
enhancement after IV contrast administration. Imaging features
suggest acute/subacute fracture.
3. Ulcerated plaque versus penetrating ulcer noted infrarenal
abdominal aorta measuring 1.6 x 1.3 cm, progressive since CT scan of
06/12/2017. In the absence of symptoms (pain), follow-up CTA in 1
year recommended to re-evaluate.
1.

## 2023-07-19 ENCOUNTER — Encounter: Payer: Self-pay | Admitting: Oncology

## 2023-07-21 ENCOUNTER — Ambulatory Visit: Attending: Physician Assistant | Admitting: Physician Assistant

## 2023-07-21 ENCOUNTER — Encounter: Payer: Self-pay | Admitting: Physician Assistant

## 2023-07-21 ENCOUNTER — Ambulatory Visit

## 2023-07-21 VITALS — BP 136/80 | HR 82 | Ht 71.0 in | Wt 195.0 lb

## 2023-07-21 DIAGNOSIS — I4510 Unspecified right bundle-branch block: Secondary | ICD-10-CM

## 2023-07-21 DIAGNOSIS — R5383 Other fatigue: Secondary | ICD-10-CM

## 2023-07-21 DIAGNOSIS — E785 Hyperlipidemia, unspecified: Secondary | ICD-10-CM

## 2023-07-21 DIAGNOSIS — I25708 Atherosclerosis of coronary artery bypass graft(s), unspecified, with other forms of angina pectoris: Secondary | ICD-10-CM

## 2023-07-21 DIAGNOSIS — R0600 Dyspnea, unspecified: Secondary | ICD-10-CM | POA: Diagnosis not present

## 2023-07-21 NOTE — Progress Notes (Unsigned)
Enrolled patient for a 14 day Zio XT monitor to be mailed to patients home  [place] to read

## 2023-07-21 NOTE — Progress Notes (Signed)
 Cardiology Office Note   Date:  07/21/2023  ID:  Joseph Soto, DOB May 15, 1945, MRN [id number] PCP: Bertha Broad, MD  [facility] Providers Cardiologist:  Antoinette Batman, MD     History of Present Illness Joseph Soto is a 78 y.o. male with past medical history of CAD s/p 4v CABG 2007, non-Hodgkin's lymphoma, ischemic cardiomyopathy and hyperlipidemia.  Cardiac catheterization in 2010 showed occluded SVG to diagonal and occluded SVG to PDA/PLA, patent LIMA to LAD.  Patient had diffuse disease in the distal vessel that was felt to be best managed medically.  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  Echocardiogram in April 2018 showed EF 40 to 45%, hypokinesis of the anteroseptal wall and apex.  Nuclear stress test in April 2018 showed possible ischemia.  Cardiac catheterization in May 2018 showed patent LIMA-LAD, all other graft known to be occluded.  Severe stenosis of the proximal RCA treated with drug-eluting stent.  Heart monitor in September 2019 showed sinus rhythm, rare PVCs and rare PACs.  Nuclear stress test in September 2019 showed no ischemia.  Echocardiogram in September 2019 showed EF 40%, trivial MR.  Cardiac catheterization in May 2021 showed patent LIMA to LAD, known occluded mid LAD, patent RCA stent.  He had moderate left main stenosis and a severe proximal left circumflex lesion.  Attempt was made PCI of left circumflex which was difficult.  He underwent balloon angioplasty of ostial left circumflex artery with no stent placement due to difficulty expanding the lesion secondary to tortuosity and calcification.  The lesion was not felt to be favorable for further attempt at PCI given difficult anatomy.  Plan for medical management of CAD.  Echocardiogram in June 2023 showed EF 55 to 60%, no significant valve disease.  He had had dyspnea and was seen by pulmonary and felt to have mild COPD in the sleep apnea.  Repeat cardiac catheterization  in January 2024 showed no change in anatomy, only patent graft there was LIMA-LAD.  Left main and left circumflex not amenable to PCI.  He was referred to Dr. Luna Salinas of CV surgery to discuss redo bypass.  He elected not to proceed with bypass surgery.  He was last seen by Dr. Abel Hoe in September 2024 at which time he denied any chest pain.  Patient presents today for follow-up and evaluation of fatigue.  He has chronic dyspnea on exertion and followed by pulmonology service.  He denies any chest pain.  2 weeks ago, while working out in his yard, he started having chills up his spine and severe fatigue.  Symptom lasted for 3 days before his chills went away.  Fatigue however stayed.  For the past few days, fatigue has worsened again.  EKG shows that he has developed a new right bundle branch block.  I suspect this is likely benign however indicate he had does have some degree of conduction disorder.  I will order a heart monitor and echocardiogram.  Will also start with CBC, basic metabolic panel and a TSH.  If all workup normal, I would not recommend any further workup.  If white blood cell count is high, may need to assess for possibility of infection by PCP.  Note, patient also has a prior history of non-Hodgkin's lymphoma.  His most recent white blood cell count was normal in 2024.  ROS:   Patient complains of fatigue.  He denies any chest pain, he has baseline dyspnea on exertion that has been unchanged.  He has no lower extremity edema, orthopnea or PND.  Studies Reviewed      Cardiac Studies & Procedures   ______________________________________________________________________________________________ CARDIAC CATHETERIZATION  CARDIAC CATHETERIZATION 02/26/2022  Conclusion   Ost LM to LM lesion is 40% stenosed.   LM lesion is 40% stenosed.   Ost LAD to Prox LAD lesion is 90% stenosed.   Mid LAD lesion is 100% stenosed.   Prox RCA to Mid RCA lesion is 40% stenosed.   Dist RCA-2 lesion  is 50% stenosed.   Dist RCA-1 lesion is 50% stenosed.   Origin lesion is 100% stenosed.   Origin to Prox Graft lesion before Dist RCA  is 100% stenosed.   Origin to Prox Graft lesion is 100% stenosed.   Ost RPDA to RPDA lesion is 70% stenosed.   RPDA lesion is 70% stenosed.   RPAV lesion is 50% stenosed.   1st Diag lesion is 95% stenosed.   Ost Cx to Prox Cx lesion is 90% stenosed.   Mid LM to Dist LM lesion is 70% stenosed.   Non-stenotic Prox RCA lesion was previously treated.   SVG.   SVG.   SVG.   LIMA graft was visualized by angiography.  1. Triple vessel CAD s/p 4V CABG with 1/4 patent bypass grafts 2. Chronic mid LAD occlusion with patent LIMA graft to the LAD. There is a moderate caliber Diagonal branch that fills antegrade. The Diagonal branch has severe ostial stenosis. 3. Severe proximal Circumflex stenosis in a moderate caliber vessel.  The vein graft to the intermediate branch is known to be occluded by the prior cath. I attempted to work on this vessel in 2021 but due to angulation and calcification in the distal left main and Circumflex, I could not adequately treat this vessel. PCI is not an option in this segment. 4. Patent proximal RCA stent. Moderate mid RCA stenosis.  Severe stenosis in the ostium of the PDA. The sequential vein graft to the PDA and PLA is known to be occluded by prior cath. 5. Moderate calcified distal left main stenosis  Recommendations: Will continue medical management of CAD. His left main disease and proximal Circumflex disease is not amenable to percutaneous therapy. These vessels are all potential targets for bypass. The Diagonal and right sided PDA are also potential targets for bypass. I will continue medical therapy for now. He wishes to be referred to CT surgery to discuss re-do bypass. I will place this referral. He is a very functional 78 yo male with some degree of COPD and Non Hodgkins Lymphoma in remission.  Findings Coronary  Findings Diagnostic  Dominance: Right  Left Main Ost LM to LM lesion is 40% stenosed. The lesion is eccentric. The lesion is calcified. LM lesion is 40% stenosed. Mid LM to Dist LM lesion is 70% stenosed. The lesion is calcified.  Left Anterior Descending Ost LAD to Prox LAD lesion is 90% stenosed. The lesion is calcified. Mid LAD lesion is 100% stenosed. The lesion is chronically occluded.  First Diagonal Branch Vessel is small in size. 1st Diag lesion is 95% stenosed.  First Septal Branch Vessel is small in size.  Second Diagonal Branch Vessel is small in size.  Second Septal Branch Vessel is small in size.  Third Diagonal Branch Vessel is small in size.  Third Septal Branch Vessel is small in size.  Ramus Intermedius Vessel is small.  Left Circumflex Vessel is moderate in size. Ost Cx to Prox Cx lesion is 90% stenosed. The lesion is  calcified. The lesion was previously treated .  First Obtuse Marginal Branch Vessel is moderate in size.  Second Obtuse Marginal Branch Vessel is small in size.  Right Coronary Artery Non-stenotic Prox RCA lesion was previously treated. Prox RCA to Mid RCA lesion is 40% stenosed. The lesion is calcified. Dist RCA-1 lesion is 50% stenosed. The lesion is calcified. Dist RCA-2 lesion is 50% stenosed. The lesion is calcified.  Right Posterior Descending Artery Ost RPDA to RPDA lesion is 70% stenosed. The lesion is calcified. RPDA lesion is 70% stenosed.  Right Posterior Atrioventricular Artery RPAV lesion is 50% stenosed.  Saphenous Graft To Ramus SVG. Origin lesion is 100% stenosed. The lesion is chronically occluded.  Sequential Saphenous Graft To Dist RCA, RPDA SVG. Origin to Prox Graft lesion before Dist RCA  is 100% stenosed. The lesion is chronically occluded.  Saphenous Graft To 1st Mrg SVG. Origin to Prox Graft lesion is 100% stenosed. The lesion is chronically occluded.  LIMA LIMA Graft To Dist LAD LIMA graft  was visualized by angiography.  Intervention  No interventions have been documented.   CARDIAC CATHETERIZATION  CARDIAC CATHETERIZATION 06/14/2019  Conclusion  Ost LM to LM lesion is 40% stenosed.  LM lesion is 40% stenosed.  Ost LAD to Prox LAD lesion is 90% stenosed.  Mid LAD lesion is 100% stenosed.  Ost Cx to Prox Cx lesion is 95% stenosed.  Previously placed Prox RCA drug eluting stent is widely patent.  Balloon angioplasty was performed.  Prox RCA to Mid RCA lesion is 40% stenosed.  Dist RCA-2 lesion is 50% stenosed.  Dist RCA-1 lesion is 50% stenosed.  Ost RPDA to RPDA lesion is 70% stenosed.  LIMA and is normal in caliber.  SVG.  Origin lesion is 100% stenosed.  SVG.  Origin to Prox Graft lesion before Dist RCA is 100% stenosed.  RPDA lesion is 70% stenosed.  RPAV lesion is 50% stenosed.  The left ventricular ejection fraction is 35-45% by visual estimate.  There is mild to moderate left ventricular systolic dysfunction.  Ost LM to Mid LM lesion is 50% stenosed.  Balloon angioplasty was performed.  Post intervention, there is a 70% residual stenosis.  Origin to Prox Graft lesion is 100% stenosed.  SVG graft was not injected.  1. Triple vessel CAD s/p 4V CABG with 1/4 patent bypass grafts 2. Chronic mid LAD occlusion with patent LIMA graft to the LAD. There is a moderate caliber Diagonal branch that fills antegrade. 3. Severe proximal Circumflex stenosis in a moderate caliber vessel. This has progressed in severity since his last cath. The vein graft to the intermediate branch is known to be occluded by the prior cath. 4. Patent proximal RCA stent. Moderate mid RCA stenosis.  Moderately severe stenosis in the ostium of the PDA. The sequential vein graft to the PDA and PLA is known to be occluded by prior cath. 5. Moderate left main stenosis 6. Mild to moderate LV systolic dysfunction 7. Balloon angioplasty of the ostial Circumflex. Stenosis  taken from 95% down to 70%.  Recommendations: Will continue medical management of CAD. His left main disease and proximal Circumflex disease is not amenable to percutaneous therapy. I do not think that his distal RCA branch disease is favorable for PCI either. If he continues to have dyspnea, may need to consider re-do bypass surgery.  Findings Coronary Findings Diagnostic  Dominance: Right  Left Main Ost LM to LM lesion is 40% stenosed. The lesion is eccentric. The lesion is calcified. Estella Helling  LM to Mid LM lesion is 50% stenosed. LM lesion is 40% stenosed.  Left Anterior Descending Ost LAD to Prox LAD lesion is 90% stenosed. The lesion is calcified. Mid LAD lesion is 100% stenosed. The lesion is chronically occluded.  First Diagonal Branch Vessel is small in size.  First Septal Branch Vessel is small in size.  Second Diagonal Branch Vessel is small in size.  Second Septal Branch Vessel is small in size.  Third Diagonal Branch Vessel is small in size.  Third Septal Branch Vessel is small in size.  Ramus Intermedius Vessel is small.  Left Circumflex Vessel is moderate in size. Ost Cx to Prox Cx lesion is 95% stenosed.  First Obtuse Marginal Branch Vessel is small in size.  Second Obtuse Marginal Branch Vessel is small in size.  Right Coronary Artery Previously placed Prox RCA drug eluting stent is widely patent. Prox RCA to Mid RCA lesion is 40% stenosed. The lesion is calcified. Dist RCA-1 lesion is 50% stenosed. The lesion is calcified. Dist RCA-2 lesion is 50% stenosed. The lesion is calcified.  Right Posterior Descending Artery Ost RPDA to RPDA lesion is 70% stenosed. The lesion is calcified. RPDA lesion is 70% stenosed.  Right Posterior Atrioventricular Artery RPAV lesion is 50% stenosed.  LIMA LIMA Graft To Mid LAD LIMA and is normal in caliber.  Saphenous Graft To Ramus SVG. Origin lesion is 100% stenosed. The lesion is chronically  occluded.  Sequential Saphenous Graft To Dist RCA, RPDA SVG. Origin to Prox Graft lesion before Dist RCA  is 100% stenosed. The lesion is chronically occluded.  Saphenous Graft To 1st Mrg SVG graft was not injected. Origin to Prox Graft lesion is 100% stenosed. The lesion is chronically occluded.  Intervention  Ost Cx to Prox Cx lesion Angioplasty Balloon angioplasty was performed. Post-Intervention Lesion Assessment The intervention was successful. Pre-interventional TIMI flow is 3. Post-intervention TIMI flow is 3. No complications occurred at this lesion. There is a 70% residual stenosis post intervention.   STRESS TESTS  MYOCARDIAL PERFUSION IMAGING 10/12/2017  Narrative  Nuclear stress EF: 37%. The left ventricular ejection fraction is moderately decreased (30-44%).  Pt walked for 7:15 of a standard Bruce protocol GXT . There were no ST or T wave changes to suggest ischemia . BP response was normal  This is an intermediate risk study ( based on reduction of LV function ) There is no ischemia or evidence of infarction   ECHOCARDIOGRAM  ECHOCARDIOGRAM COMPLETE 07/16/2021  Narrative ECHOCARDIOGRAM REPORT    Patient Name:   Jamarques Pinedo Raetz Date of Exam: 07/16/2021 Medical Rec #:  [id number]        Height:       71.0 in Accession #:    [id number]       Weight:       205.8 lb Date of Birth:  [birth year]        BSA:          2.134 m Patient Age:    76 years         BP:           116/70 mmHg Patient Gender: M                HR:           82 bpm. Exam Location:  [address]  Procedure: 2D Echo, Cardiac Doppler and Color Doppler  Indications:    I25.5 Ischemic cardiomyopathy  History:        Patient  has prior history of Echocardiogram examinations, most recent 10/18/2017. CAD and Previous Myocardial Infarction, Signs/Symptoms:Chest Pain; Risk Factors:Hypertension and Dyslipidemia. Unstable angina. Dyspnea on exertion. Fibromyalgia.  Sonographer:    Mylinda Asa  RCS Referring Phys: Odie Benne  IMPRESSIONS   1. Left ventricular ejection fraction, by estimation, is 55 to 60%. The left ventricle has normal function. The left ventricle demonstrates regional wall motion abnormalities (see scoring diagram/findings for description). Left ventricular diastolic parameters are consistent with Grade I diastolic dysfunction (impaired relaxation). 2. Right ventricular systolic function is normal. The right ventricular size is normal. Tricuspid regurgitation signal is inadequate for assessing PA pressure. 3. The mitral valve is grossly normal. No evidence of mitral valve regurgitation. No evidence of mitral stenosis. 4. The aortic valve is tricuspid. Aortic valve regurgitation is not visualized. No aortic stenosis is present. 5. The inferior vena cava is normal in size with greater than 50% respiratory variability, suggesting right atrial pressure of 3 mmHg.  Comparison(s): Changes from prior study are noted. EF has improved. Mild hypokinesis of the apical septum and apex.  FINDINGS Left Ventricle: Left ventricular ejection fraction, by estimation, is 55 to 60%. The left ventricle has normal function. The left ventricle demonstrates regional wall motion abnormalities. The left ventricular internal cavity size was normal in size. There is no left ventricular hypertrophy. Left ventricular diastolic parameters are consistent with Grade I diastolic dysfunction (impaired relaxation).   LV Wall Scoring: The apical septal segment and apex are hypokinetic.  Right Ventricle: The right ventricular size is normal. No increase in right ventricular wall thickness. Right ventricular systolic function is normal. Tricuspid regurgitation signal is inadequate for assessing PA pressure.  Left Atrium: Left atrial size was normal in size.  Right Atrium: Right atrial size was normal in size.  Pericardium: There is no evidence of pericardial effusion.  Mitral Valve:  The mitral valve is grossly normal. Mild mitral annular calcification. No evidence of mitral valve regurgitation. No evidence of mitral valve stenosis.  Tricuspid Valve: The tricuspid valve is grossly normal. Tricuspid valve regurgitation is not demonstrated. No evidence of tricuspid stenosis.  Aortic Valve: The aortic valve is tricuspid. Aortic valve regurgitation is not visualized. No aortic stenosis is present.  Pulmonic Valve: The pulmonic valve was grossly normal. Pulmonic valve regurgitation is not visualized. No evidence of pulmonic stenosis.  Aorta: The aortic root and ascending aorta are structurally normal, with no evidence of dilitation.  Venous: The inferior vena cava is normal in size with greater than 50% respiratory variability, suggesting right atrial pressure of 3 mmHg.  IAS/Shunts: The atrial septum is grossly normal.   LEFT VENTRICLE PLAX 2D LVIDd:         4.60 cm   Diastology LVIDs:         3.00 cm   LV e' medial:    7.07 cm/s LV PW:         0.80 cm   LV E/e' medial:  7.2 LV IVS:        0.90 cm   LV e' lateral:   8.16 cm/s LVOT diam:     2.10 cm   LV E/e' lateral: 6.2 LV SV:         54 LV SV Index:   25 LVOT Area:     3.46 cm   RIGHT VENTRICLE RV Basal diam:  2.50 cm RV S prime:     11.20 cm/s TAPSE (M-mode): 1.1 cm  LEFT ATRIUM  Index        RIGHT ATRIUM           Index LA diam:        3.10 cm 1.45 cm/m   RA Area:     13.20 cm LA Vol (A2C):   42.7 ml 20.01 ml/m  RA Volume:   25.90 ml  12.13 ml/m LA Vol (A4C):   33.9 ml 15.88 ml/m LA Biplane Vol: 37.9 ml 17.76 ml/m AORTIC VALVE LVOT Vmax:   91.00 cm/s LVOT Vmean:  63.300 cm/s LVOT VTI:    0.157 m  AORTA Ao Root diam: 3.70 cm Ao Asc diam:  3.60 cm  MITRAL VALVE MV Area (PHT): 2.13 cm    SHUNTS MV Decel Time: 356 msec    Systemic VTI:  0.16 m MV E velocity: 50.80 cm/s  Systemic Diam: 2.10 cm MV A velocity: 96.90 cm/s MV E/A ratio:  0.52  Jackquelyn Mass MD Electronically signed  by Jackquelyn Mass MD Signature Date/Time: 07/16/2021/11:22:37 AM    Final          ______________________________________________________________________________________________      Risk Assessment/Calculations           Physical Exam VS:  BP 136/80   Pulse 82   Ht 5' 11 (1.803 m)   Wt 195 lb (88.5 kg)   SpO2 94%   BMI 27.20 kg/m    Wt Readings from Last 3 Encounters:  07/21/23 195 lb (88.5 kg)  06/30/23 192 lb (87.1 kg)  04/20/23 195 lb 12.8 oz (88.8 kg)    GEN: Well nourished, well developed in no acute distress NECK: No JVD; No carotid bruits CARDIAC: RRR, no murmurs, rubs, gallops RESPIRATORY:  Clear to auscultation without rales, wheezing or rhonchi  ABDOMEN: Soft, non-tender, non-distended EXTREMITIES:  No edema; No deformity   ASSESSMENT AND PLAN  Fatigue: Started about 2 weeks ago.  He described chills going up his spine followed by severe fatigue for 3 days, chills went away after 3 days, however fatigue later came back and has been worsening for the past few days.  Will obtain echocardiogram, blood work and a heart monitor.  If white blood cell count significantly up, may need evaluation by PCP.  He has a history of non-Hodgkin's lymphoma followed by oncology service as well.  Dyspnea: Chronic dyspnea with exertion, followed by pulmonology service.  Patient has a history of ischemic cardiomyopathy however EF improved on last echocardiogram.  Will repeat echocardiogram given significant fatigue recently.  New RBBB: New right bundle branch block seen on EKG today.  Only symptom recently is increased fatigue.  Obtain echocardiogram and a heart monitor  CAD: On aspirin  and Plavix .  Denies any chest pain  Hyperlipidemia: On rosuvastatin        Dispo: Follow-up in 2 to 75-month  Signed, Ervin Heath, PA

## 2023-07-21 NOTE — Patient Instructions (Signed)
 Medication Instructions:  NO CHANGES *If you need a refill on your cardiac medications before your next appointment, please call your pharmacy*  Lab Work: CBC,BMET AND TSH TODAY If you have labs (blood work) drawn today and your tests are completely normal, you will receive your results only by: MyChart Message (if you have MyChart) OR A paper copy in the mail If you have any lab test that is abnormal or we need to change your treatment, we will call you to review the results.  Testing/Procedures: Your physician has requested that you have an echocardiogram. Echocardiography is a painless test that uses sound waves to create images of your heart. It provides your doctor with information about the size and shape of your heart and how well your heart's chambers and valves are working. This procedure takes approximately one hour. There are no restrictions for this procedure. Please do NOT wear cologne, perfume, aftershave, or lotions (deodorant is allowed). Please arrive 15 minutes prior to your appointment time.  Please note: We ask at that you not bring children with you during ultrasound (echo/ vascular) testing. Due to room size and safety concerns, children are not allowed in the ultrasound rooms during exams. Our front office staff cannot provide observation of children in our lobby area while testing is being conducted. An adult accompanying a patient to their appointment will only be allowed in the ultrasound room at the discretion of the ultrasound technician under special circumstances. We apologize for any inconvenience.     ZIO XT- Long Term Monitor Instructions  Your physician has requested you wear a ZIO patch monitor for 14 days.  This is a single patch monitor. [employer] supplies one patch monitor per enrollment. Additional stickers are not available. Please do not apply patch if you will be having a Nuclear Stress Test,  Echocardiogram, Cardiac CT, MRI, or Chest Xray during the  period you would be wearing the  monitor. The patch cannot be worn during these tests. You cannot remove and re-apply the  ZIO XT patch monitor.  Your ZIO patch monitor will be mailed 3 day USPS to your address on file. It may take 3-5 days  to receive your monitor after you have been enrolled.  Once you have received your monitor, please review the enclosed instructions. Your monitor  has already been registered assigning a specific monitor serial # to you.  Billing and Patient Assistance Program Information  We have supplied [employer] with any of your insurance information on file for billing purposes. [employer] offers a sliding scale Patient Assistance Program for patients that do not have  insurance, or whose insurance does not completely cover the cost of the ZIO monitor.  You must apply for the Patient Assistance Program to qualify for this discounted rate.  To apply, please call [employer] at [phone], select option 4, select option 2, ask to apply for  Patient Assistance Program. Sanna Crystal will ask your household income, and how many people  are in your household. They will quote your out-of-pocket cost based on that information.  [employer] will also be able to set up a 1-month, interest-free payment plan if needed.  Applying the monitor   Shave hair from upper left chest.  Hold abrader disc by orange tab. Rub abrader in 40 strokes over the upper left chest as  indicated in your monitor instructions.  Clean area with 4 enclosed alcohol pads. Let dry.  Apply patch as indicated in monitor instructions. Patch will be placed under collarbone on left  side of chest with arrow pointing upward.  Rub patch adhesive wings for 2 minutes. Remove white label marked 1. Remove the white  label marked 2. Rub patch adhesive wings for 2 additional minutes.  While looking in a mirror, press and release button in center of patch. A small green light will  flash 3-4 times. This will be your only  indicator that the monitor has been turned on.  Do not shower for the first 24 hours. You may shower after the first 24 hours.  Press the button if you feel a symptom. You will hear a small click. Record Date, Time and  Symptom in the Patient Logbook.  When you are ready to remove the patch, follow instructions on the last 2 pages of Patient  Logbook. Stick patch monitor onto the last page of Patient Logbook.  Place Patient Logbook in the blue and white box. Use locking tab on box and tape box closed  securely. The blue and white box has prepaid postage on it. Please place it in the mailbox as  soon as possible. Your physician should have your test results approximately 7 days after the  monitor has been mailed back to [facility].  Call [facility] Customer Care at [phone] if you have questions regarding  your ZIO XT patch monitor. Call them immediately if you see an orange light blinking on your  monitor.  If your monitor falls off in less than 4 days, contact our Monitor department at [phone].  If your monitor becomes loose or falls off after 4 days call [employer] at [phone] for  suggestions on securing your monitor   Follow-Up: At [facility], you and your health needs are our priority.  As part of our continuing mission to provide you with exceptional heart care, our providers are all part of one team.  This team includes your primary Cardiologist (physician) and Advanced Practice Providers or APPs (Physician Assistants and Nurse Practitioners) who all work together to provide you with the care you need, when you need it.  Your next appointment:   2-3 month(s)  Provider:   Antoinette Batman, MD  or Ervin Heath, [place]

## 2023-07-22 ENCOUNTER — Ambulatory Visit: Payer: Self-pay | Admitting: Physician Assistant

## 2023-07-22 LAB — CBC
Hematocrit: 41.3 % (ref 37.5–51.0)
Hemoglobin: 13 g/dL (ref 13.0–17.7)
MCH: 30.1 pg (ref 26.6–33.0)
MCHC: 31.5 g/dL (ref 31.5–35.7)
MCV: 96 fL (ref 79–97)
Platelets: 294 10*3/uL (ref 150–450)
RBC: 4.32 x10E6/uL (ref 4.14–5.80)
RDW: 12.7 % (ref 11.6–15.4)
WBC: 12.6 10*3/uL — ABNORMAL HIGH (ref 3.4–10.8)

## 2023-07-22 LAB — BASIC METABOLIC PANEL WITH GFR
BUN/Creatinine Ratio: 17 (ref 10–24)
BUN: 21 mg/dL (ref 8–27)
CO2: 20 mmol/L (ref 20–29)
Calcium: 9 mg/dL (ref 8.6–10.2)
Chloride: 103 mmol/L (ref 96–106)
Creatinine, Ser: 1.23 mg/dL (ref 0.76–1.27)
Glucose: 97 mg/dL (ref 70–99)
Potassium: 5 mmol/L (ref 3.5–5.2)
Sodium: 139 mmol/L (ref 134–144)
eGFR: 60 mL/min/{1.73_m2} (ref >59)

## 2023-07-22 LAB — TSH: TSH: 2.84 u[IU]/mL (ref 0.450–4.500)

## 2023-07-22 NOTE — Progress Notes (Signed)
 I have called Joseph Soto, stable renal function and electrolyte, stable red blood cell count. White blood cell count elevated. Thyroid  level normal. I asked the patient to monitor for sign of infection. I wonder if he had viral infection that could have contributed to the recent fatigue. However if symptom persist, will need to rule out bacteremia infection as well.

## 2023-07-26 DIAGNOSIS — M545 Low back pain, unspecified: Secondary | ICD-10-CM | POA: Diagnosis not present

## 2023-07-26 DIAGNOSIS — M5481 Occipital neuralgia: Secondary | ICD-10-CM | POA: Diagnosis not present

## 2023-07-29 ENCOUNTER — Ambulatory Visit: Payer: Self-pay

## 2023-07-29 NOTE — Telephone Encounter (Signed)
 FYI Only or Action Required?: Action required by provider: clinical question for provider.  Patient is followed in Pulmonology for COPD, last seen on 06/30/2023 by Aleck Hurdle, MD. Called Nurse Triage reporting Medication Problem, Fatigue, and Shortness of Breath. Symptoms began a week ago. Interventions attempted: Prescription medications: Ohtuvayre  and Other: appt with cards/PCP. Symptoms are: unchanged.  Triage Disposition: See HCP Within 4 Hours (Or PCP Triage)  Patient/caregiver understands and will follow disposition?: No, wishes to speak with PCP    Copied from CRM [id number]. Topic: Clinical - Red Word Triage >> Jul 29, 2023  3:23 PM Joseph Soto wrote: Red Word that prompted transfer to Nurse Triage: pt prescribed albuterol  (VENTOLIN  HFA) 108 (90 Base) MCG/ACT inhaler He says another med.  He states he is getting  worse.  Easy SOB. Reason for Disposition  [1] Longstanding difficulty breathing (e.g., CHF, COPD, emphysema) AND [2] WORSE than normal  Answer Assessment - Initial Assessment Questions E2C2 Pulmonary Triage - Initial Assessment Questions Chief Complaint (e.g., cough, sob, wheezing, fever, chills, sweat or additional symptoms) *Go to specific symptom protocol after initial questions. Fatigue, SOB worsening - reports having events where he has severe SOB and respiratory regimen is not providing relief  Recent OV with Dione Franks and started on Ohtuvayre -- Pt reports that SOB has gotten worse  Of note, recent Cardiology appt with EKG, echo, and 14-day heart monitor that he is currently wearing. Pt is unsure if sx are r/t Ohtuvayre  and seeking guidance from Dr. Dione Franks. Pt did report going to PCP last week with no insight.  How long have symptoms been present? 1 week - worsening over past few days  Have you tested for COVID or Flu? Note: If not, ask patient if a home test can be taken. If so, instruct patient to call back for positive results. No  MEDICINES:    Have you used  your inhalers/maintenance medication? Yes If yes, What medications? Albuterol  & Ohtuvayre  - neb twice daily Reports getting worse  If inhaler, ask How many puffs and how often? Note: Review instructions on medication in the chart. See above  OXYGEN : Do you wear supplemental oxygen ? Yes If yes, How many liters are you supposed to use? 2.5L at night  Do you monitor your oxygen  levels? Yes If yes, What is your reading (oxygen  level) today? 68  What is your usual oxygen  saturation reading?  (Note: Pulmonary O2 sats should be 90% or greater) > 95    1. RESPIRATORY STATUS: Describe your breathing? (e.g., wheezing, shortness of breath, unable to speak, severe coughing)      See above 2. ONSET: When did this breathing problem begin?      See above 3. PATTERN Does the difficult breathing come and go, or has it been constant since it started?      Come and go 4. SEVERITY: How bad is your breathing? (e.g., mild, moderate, severe)    - MILD: No SOB at rest, mild SOB with walking, speaks normally in sentences, can lie down, no retractions, pulse < 100.    - MODERATE: SOB at rest, SOB with minimal exertion and prefers to sit, cannot lie down flat, speaks in phrases, mild retractions, audible wheezing, pulse 100-120.    - SEVERE: Very SOB at rest, speaks in single words, struggling to breathe, sitting hunched forward, retractions, pulse > 120      Mild-mod [facility] does not appreciate audible SOB/wheezing during call. Pt is speaking in full sentences.  5. RECURRENT SYMPTOM: Have you  had difficulty breathing before? If Yes, ask: When was the last time? and What happened that time?      unknown 6. CARDIAC HISTORY: Do you have any history of heart disease? (e.g., heart attack, angina, bypass surgery, angioplasty)      Hx of heart attack 7. LUNG HISTORY: Do you have any history of lung disease?  (e.g., pulmonary embolus, asthma, emphysema)     COPD 8. CAUSE:  What do you think is causing the breathing problem?      Possibly COPD 9. OTHER SYMPTOMS: Do you have any other symptoms? (e.g., dizziness, runny nose, cough, chest pain, fever)     Runny nose at baseline 10. O2 SATURATION MONITOR:  Do you use an oxygen  saturation monitor (pulse oximeter) at home? If Yes, ask: What is your reading (oxygen  level) today? What is your usual oxygen  saturation reading? (e.g., 95%)       See above 11. PREGNANCY: Is there any chance you are pregnant? When was your last menstrual period?       N/a 12. TRAVEL: Have you traveled out of the country in the last month? (e.g., travel history, exposures)       N/a  Protocols used: Breathing Difficulty-A-AH

## 2023-07-30 NOTE — Telephone Encounter (Signed)
ATC x1 LVM for patient to call our office back. 

## 2023-07-30 NOTE — Telephone Encounter (Signed)
 Please stop ohtuvayre  and monitor response to breathing. If symptoms of shortness of breath, chest tightness, wheezing, and no relief from rescue inhaler and maintenance regimen recommend a course of prednisone .

## 2023-07-30 NOTE — Telephone Encounter (Signed)
 ATC patient's cell # and reached a recording to enter the mailbox #.    Called patient's wife's cell # and spoke with patient/wife (DPR) and relayed the message from Dr. Dione Franks.  They verbalized understanding and will call back if he does not get relief from rescue inhaler after stopping Ohtuvayre  and using maintenance inhaler.  Nothing further needed.

## 2023-08-05 ENCOUNTER — Telehealth (HOSPITAL_BASED_OUTPATIENT_CLINIC_OR_DEPARTMENT_OTHER): Payer: Self-pay

## 2023-08-05 NOTE — Telephone Encounter (Signed)
 Copied from CRM [id number]. Topic: Clinical - Medical Advice >> Aug 04, 2023  9:43 AM Joseph Soto wrote: Reason for CRM: pt got message about the rescue inhaler. states he has a question.  Pt states he is feeling much better, but was doing the inhalers back to back.  Probably pulling too much medicine, and sucking in too hard.  He has since stopped pulling so hard on the inhalers. Would like to know if he continues to use inhalers like this, do you think it would be OK to try the Ohtuvayre  again? He believes he was getting too much medication in all at once, and sucking way too hard. [phone]

## 2023-08-06 DIAGNOSIS — J449 Chronic obstructive pulmonary disease, unspecified: Secondary | ICD-10-CM | POA: Diagnosis not present

## 2023-08-09 ENCOUNTER — Ambulatory Visit: Payer: Self-pay | Admitting: Internal Medicine

## 2023-08-09 NOTE — Telephone Encounter (Signed)
 Called the number provided- there was no answer and no option to leave msg. I was instead, prompted to enter mailbox number.  Will try back later.

## 2023-08-09 NOTE — Telephone Encounter (Signed)
 FYI Only or Action Required?: Action required by provider: update on patient condition.  Patient is followed in Pulmonology for COPD, last seen on 06/30/2023 by [name] Verdon RAMAN, MD. Called Nurse Triage reporting Shortness of Breath. Symptoms began patient states slowly getting worse. Interventions attempted: OTC medications: Allegra and Other: CPAP at night. Symptoms are: gradually worsening.  Triage Disposition: See HCP Within 4 Hours (Or PCP Triage)  Patient/caregiver understands and will follow disposition?: No, wishes to speak with PCP                 Copied from CRM [id number]. Topic: Clinical - Red Word Triage >> Aug 09, 2023 12:10 PM Chantha C wrote: Red Word that prompted transfer to Nurse Triage: Patient [phone] wants Dr. [name] to know he had side effects to albuterol  (PROVENTIL ) (2.5 MG/3ML) 0.083% nebulizer solution is not working, feeling light headed, dizziness, shortness of breath still, coughing and discomfort across the rib cage. Patient denies wheezing, nor fever. Patient stopped the medication yesterday. Patient had a heart monitor for two weeks and is waiting for the results from cardiologist. Please advise. Reason for Disposition  [1] Longstanding difficulty breathing (e.g., CHF, COPD, emphysema) AND [2] WORSE than normal  Answer Assessment - Initial Assessment Questions E2C2 Pulmonary Triage - Initial Assessment Questions Chief Complaint (e.g., cough, sob, wheezing, fever, chills, sweat or additional symptoms) *Go to specific symptom protocol after initial questions. Increased/worsening shortness of breath on exertion    Have you tested for COVID or Flu? Note: If not, ask patient if a home test can be taken. If so, instruct patient to call back for positive results. No   3 weeks ago covid and flu all negative  MEDICINES:   Have you used any OTC meds to help with symptoms? No If yes, ask What medications? Allegra  Have you used your  inhalers/maintenance medication? No If yes, What medications? N/A  If inhaler, ask How many puffs and how often? Note: Review instructions on medication in the chart. N/A  OXYGEN : Do you wear supplemental oxygen ? Yes If yes, How many liters are you supposed to use? CPAP at night to sleep  Do you monitor your oxygen  levels? Yes If yes, What is your reading (oxygen  level) today? 95% and above  What is your usual oxygen  saturation reading?  (Note: Pulmonary O2 sats should be 90% or greater) Always above 95%     1. RESPIRATORY STATUS: Describe your breathing? (e.g., wheezing, shortness of breath, unable to speak, severe coughing)      Shortness of breath quicker 2. ONSET: When did this breathing problem begin?      Been slowly getting worse since starting on medications 3. PATTERN Does the difficult breathing come and go, or has it been constant since it started?      Gets worse 4. SEVERITY: How bad is your breathing? (e.g., mild, moderate, severe)    - MILD: No SOB at rest, mild SOB with walking, speaks normally in sentences, can lie down, no retractions, pulse < 100.    - MODERATE: SOB at rest, SOB with minimal exertion and prefers to sit, cannot lie down flat, speaks in phrases, mild retractions, audible wheezing, pulse 100-120.    - SEVERE: Very SOB at rest, speaks in single words, struggling to breathe, sitting hunched forward, retractions, pulse > 120      Worse on exertion 5. RECURRENT SYMPTOM: Have you had difficulty breathing before? If Yes, ask: When was the last time? and What happened that  time?      ------- 6. CARDIAC HISTORY: Do you have any history of heart disease? (e.g., heart attack, angina, bypass surgery, angioplasty)      CAD, wore a heart monitor for two weeks and mailed it back in yesterday morning 7. LUNG HISTORY: Do you have any history of lung disease?  (e.g., pulmonary embolus, asthma, emphysema)     COPD,  8. CAUSE: What  do you think is causing the breathing problem?      unsure 9. OTHER SYMPTOMS: Do you have any other symptoms? (e.g., dizziness, runny nose, cough, chest pain, fever)     Runny nose from allergies per patient 10. O2 SATURATION MONITOR:  Do you use an oxygen  saturation monitor (pulse oximeter) at home? If Yes, ask: What is your reading (oxygen  level) today? What is your usual oxygen  saturation reading? (e.g., 95%)       Never below 95% when he checked it every morning   Patient wanted to let Dr [name] know he stopped Albuterol  nebulizers as well now because he didn't like how they made him feel--made him lightheaded. He feels like episodes of shortness of breath are getting a little worse when they happen now. He also mailed in his heart monitor yesterday and waiting on results. Patient is advised that if anything worsens to go to the Emergency Room. Patient verbalized understanding.  Protocols used: Breathing Difficulty-A-AH

## 2023-08-09 NOTE — Telephone Encounter (Signed)
 Absolutely, ok to retry in the future.

## 2023-08-11 DIAGNOSIS — J441 Chronic obstructive pulmonary disease with (acute) exacerbation: Secondary | ICD-10-CM | POA: Diagnosis not present

## 2023-08-11 DIAGNOSIS — R7989 Other specified abnormal findings of blood chemistry: Secondary | ICD-10-CM | POA: Diagnosis not present

## 2023-08-11 DIAGNOSIS — R5383 Other fatigue: Secondary | ICD-10-CM | POA: Diagnosis not present

## 2023-08-11 DIAGNOSIS — R058 Other specified cough: Secondary | ICD-10-CM | POA: Diagnosis not present

## 2023-08-11 DIAGNOSIS — Z8572 Personal history of non-Hodgkin lymphomas: Secondary | ICD-10-CM | POA: Diagnosis not present

## 2023-08-11 DIAGNOSIS — J439 Emphysema, unspecified: Secondary | ICD-10-CM | POA: Diagnosis not present

## 2023-08-12 DIAGNOSIS — R0600 Dyspnea, unspecified: Secondary | ICD-10-CM | POA: Diagnosis not present

## 2023-08-12 DIAGNOSIS — R5383 Other fatigue: Secondary | ICD-10-CM | POA: Diagnosis not present

## 2023-08-16 DIAGNOSIS — R0600 Dyspnea, unspecified: Secondary | ICD-10-CM

## 2023-08-16 DIAGNOSIS — I4510 Unspecified right bundle-branch block: Secondary | ICD-10-CM

## 2023-08-16 DIAGNOSIS — R5383 Other fatigue: Secondary | ICD-10-CM

## 2023-08-17 NOTE — Telephone Encounter (Signed)
 Spoke with patient regarding prior message . Per Dr.Desai  Ok to stop albuterol  nebulizer treatments. If the heart monitor shows any abnormal heart rhythm we can try levalbuterol as rescue therapy instead which will usually will not cause palpitations.     Patient's voice was understanding and waiting for the heart monitor .

## 2023-08-17 NOTE — Telephone Encounter (Signed)
 Ok to stop albuterol  nebulizer treatments. If the heart monitor shows any abnormal heart rhythm we can try levalbuterol as rescue therapy instead which will usually will not cause palpitations.

## 2023-08-18 ENCOUNTER — Encounter: Payer: Self-pay | Admitting: Internal Medicine

## 2023-08-18 ENCOUNTER — Other Ambulatory Visit: Payer: Self-pay

## 2023-08-18 ENCOUNTER — Other Ambulatory Visit: Payer: Self-pay | Admitting: Physician Assistant

## 2023-08-18 DIAGNOSIS — J4489 Other specified chronic obstructive pulmonary disease: Secondary | ICD-10-CM

## 2023-08-18 MED ORDER — METOPROLOL SUCCINATE ER 25 MG PO TB24: 25.0000 mg | ORAL_TABLET | Freq: Two times a day (BID) | ORAL | 3 refills | Status: AC

## 2023-08-18 MED ORDER — METOPROLOL SUCCINATE ER 25 MG PO TB24: 25.0000 mg | ORAL_TABLET | Freq: Two times a day (BID) | ORAL | Status: DC

## 2023-08-18 NOTE — Telephone Encounter (Signed)
 I have called and spoke with Joseph Soto, all questions answered. The result of heart monitor reviewed. New rx of metoprolol  succinate 25mg  BID sent to his pharmacy.  Nayleah Gamel

## 2023-08-19 NOTE — Telephone Encounter (Signed)
Referral placed for pulmonary rehab

## 2023-08-20 ENCOUNTER — Telehealth (HOSPITAL_COMMUNITY): Payer: Self-pay

## 2023-08-20 NOTE — Telephone Encounter (Signed)
 Pt insurance is active and benefits verified through [employer] Co-pay $15, DED 0/0 met, out of pocket $3,400/$1,141.50 met, co-insurance 0%. [id number] IS NOT COVERED.SABRA no pre-authorization required, Sheetal/[employer] adv 08/20/2023@4 :05, REF# [id number]

## 2023-08-20 NOTE — Telephone Encounter (Signed)
 Called patient to see if Joseph Soto was interested in participating in the Pulmonary Rehab Program. Patient stated yes. Patient will come in for orientation on 09/13/23 and will attend the 10:15 am exercise class.  Sent package via [employer].

## 2023-09-02 ENCOUNTER — Other Ambulatory Visit (HOSPITAL_COMMUNITY)

## 2023-09-05 DIAGNOSIS — J449 Chronic obstructive pulmonary disease, unspecified: Secondary | ICD-10-CM | POA: Diagnosis not present

## 2023-09-10 ENCOUNTER — Telehealth (HOSPITAL_COMMUNITY): Payer: Self-pay

## 2023-09-10 NOTE — Telephone Encounter (Signed)
 Called pt to confirm his PR appointment on 09/13/23 @10 :30 a.m. No answer. Left VM.

## 2023-09-13 ENCOUNTER — Encounter (HOSPITAL_COMMUNITY): Payer: Self-pay

## 2023-09-13 ENCOUNTER — Encounter (HOSPITAL_COMMUNITY)
Admission: RE | Admit: 2023-09-13 | Discharge: 2023-09-13 | Disposition: A | Discharge status: Home / Self Care | Source: Ambulatory Visit | Attending: Internal Medicine | Admitting: Internal Medicine

## 2023-09-13 ENCOUNTER — Ambulatory Visit (HOSPITAL_COMMUNITY)
Admission: RE | Admit: 2023-09-13 | Discharge: 2023-09-13 | Disposition: A | Discharge status: Home / Self Care | Source: Ambulatory Visit | Attending: Cardiology | Admitting: Cardiology

## 2023-09-13 VITALS — BP 126/60 | HR 85 | Wt 201.3 lb

## 2023-09-13 DIAGNOSIS — J449 Chronic obstructive pulmonary disease, unspecified: Secondary | ICD-10-CM | POA: Insufficient documentation

## 2023-09-13 DIAGNOSIS — R0600 Dyspnea, unspecified: Secondary | ICD-10-CM | POA: Insufficient documentation

## 2023-09-13 DIAGNOSIS — I2510 Atherosclerotic heart disease of native coronary artery without angina pectoris: Secondary | ICD-10-CM | POA: Diagnosis not present

## 2023-09-13 DIAGNOSIS — R5383 Other fatigue: Secondary | ICD-10-CM | POA: Diagnosis not present

## 2023-09-13 DIAGNOSIS — I255 Ischemic cardiomyopathy: Secondary | ICD-10-CM | POA: Diagnosis not present

## 2023-09-13 DIAGNOSIS — Z951 Presence of aortocoronary bypass graft: Secondary | ICD-10-CM | POA: Insufficient documentation

## 2023-09-13 LAB — ECHOCARDIOGRAM COMPLETE
Area-P 1/2: 3.24 cm2
S' Lateral: 4.1 cm
Weight: 3220.48 [oz_av]

## 2023-09-13 NOTE — Progress Notes (Signed)
 Pulmonary Individual Treatment Plan  Patient Details  Name: Joseph Soto MRN: [id number] Date of Birth: Nov 16, 1945 Referring Provider:   [name] Pulmonary Rehab Walk Test from 09/13/2023 in [facility]  Referring Provider [name]    Initial Encounter Date:  Flowsheet Row Pulmonary Rehab Walk Test from 09/13/2023 in [facility]  Date 09/13/23    Visit Diagnosis: Stage 2 moderate COPD by GOLD classification (HCC)  Patient's Home Medications on Admission:   Current Outpatient Medications:    acetaminophen  (TYLENOL ) 500 MG tablet, Take 500-1,000 mg by mouth every 6 (six) hours as needed (pain.)., Disp: , Rfl:    albuterol  (PROVENTIL ) (2.5 MG/3ML) 0.083% nebulizer solution, Take 3 mLs (2.5 mg total) by nebulization every 6 (six) hours as needed for wheezing or shortness of breath., Disp: 75 mL, Rfl: 12   albuterol  (VENTOLIN  HFA) 108 (90 Base) MCG/ACT inhaler, Inhale into the lungs every 6 (six) hours as needed for wheezing or shortness of breath., Disp: , Rfl:    amLODipine  (NORVASC ) 5 MG tablet, Take 5 mg by mouth at bedtime., Disp: , Rfl:    aspirin  EC 81 MG tablet, Take 81 mg by mouth at bedtime., Disp: , Rfl:    cholecalciferol  (VITAMIN D ) 1000 units tablet, Take 2,000 Units by mouth daily with breakfast., Disp: , Rfl:    clopidogrel  (PLAVIX ) 75 MG tablet, TAKE 1 TABLET BY MOUTH EVERY DAY WITH BREAKFAST, Disp: 90 tablet, Rfl: 3   Coenzyme Q10 300 MG CAPS, Take 300 mg by mouth daily., Disp: , Rfl:    cyclobenzaprine  (FLEXERIL ) 10 MG tablet, Take 10 mg by mouth 2 (two) times daily., Disp: , Rfl:    doxycycline (VIBRA-TABS) 100 MG tablet, 1 tablet Orally twice a day for 10 days, Disp: , Rfl:    ezetimibe  (ZETIA ) 10 MG tablet, TAKE 1 TABLET BY MOUTH EVERY DAY, Disp: 90 tablet, Rfl: 1   fluticasone  (FLONASE ) 50 MCG/ACT nasal spray, Place 1 spray into both nostrils daily as  needed for rhinitis or allergies., Disp: , Rfl:    meloxicam  (MOBIC ) 15 MG tablet, Take 15 mg by mouth daily., Disp: , Rfl:    metoprolol  succinate (TOPROL -XL) 25 MG 24 hr tablet, Take 1 tablet (25 mg total) by mouth in the morning and at bedtime., Disp: 180 tablet, Rfl: 3   naltrexone  (DEPADE) 50 MG tablet, Take 50 mg by mouth 2 (two) times daily., Disp: , Rfl:    nitroGLYCERIN  (NITROSTAT ) 0.4 MG SL tablet, Place 1 tablet (0.4 mg total) under the tongue every 5 (five) minutes as needed for chest pain., Disp: 25 tablet, Rfl: 3   OHTUVAYRE  3 MG/2.5ML SUSP, Inhale 3 mg into the lungs 2 (two) times daily., Disp: , Rfl:    predniSONE  (DELTASONE ) 20 MG tablet, Take 20 mg by mouth daily., Disp: , Rfl:    rosuvastatin  (CRESTOR ) 40 MG tablet, Take 1 tablet (40 mg total) by mouth daily., Disp: 90 tablet, Rfl: 0   TRELEGY ELLIPTA 200-62.5-25 MCG/ACT AEPB, Inhale 1 puff into the lungs daily., Disp: , Rfl:    azithromycin (ZITHROMAX) 250 MG tablet, Take by mouth. (Patient not taking: Reported on 09/13/2023), Disp: , Rfl:    isosorbide  mononitrate (IMDUR ) 30 MG 24 hr tablet, Take 1 tablet (30 mg total) by mouth daily. Please schedule yearly appointment for future refills. 1st attempt. Thank you, Disp: 45 tablet, Rfl: 0  Past Medical History: Past  Medical History:  Diagnosis Date   Allergic rhinitis    grass, dust   Buttock pain    CAD (coronary artery disease)    Last cath 2010 per Dr. 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 Arnita office)   Myeloma [facility])    found it in my chest when they did the excision   Myocardial infarction (HCC) 06/2005   Non Hodgkin's lymphoma (HCC) dx'd 06/2005    Tobacco Use: Social History   Tobacco Use  Smoking Status Former   Current packs/day: 0.00   Average packs/day: 2.0 packs/day for 30.0 years (60.0 ttl pk-yrs)   Types: Cigarettes   Start date: 05/17/1970   Quit date: 05/16/2000   Years since quitting: 23.3  Smokeless Tobacco Never  Tobacco Comments   quit 06/2005    Labs: Review Flowsheet  More data exists      Latest Ref Rng & Units 09/14/2011 11/30/2011 05/02/2012 12/23/2015 01/24/2018  Labs for ITP Cardiac and Pulmonary Rehab  Cholestrol 100 - 199 mg/dL 882  828  818  - 883   LDL (calc) 0 - 99 mg/dL 53  890  875  - 52   HDL-C >39 mg/dL 47.29  47.19  54.29  - 46   Trlycerides 0 - 149 mg/dL 41.9  53.9  40.9  - 90   TCO2 0 - 100 mmol/L - - - 22  -    Capillary Blood Glucose: Lab Results  Component Value Date   GLUCAP 97 01/24/2013   GLUCAP 86 01/24/2013   GLUCAP 89 01/24/2013     Pulmonary Assessment Scores:  Pulmonary Assessment Scores     Row Name 09/13/23 1127         ADL UCSD   ADL Phase Entry     SOB Score total 41       CAT Score   CAT Score 21       mMRC Score   mMRC Score 2       UCSD: Self-administered rating of dyspnea associated with activities of daily living (ADLs) 6-point scale (0 = not at all to 5 = maximal or unable to do because of breathlessness)  Scoring Scores range from 0 to 120.  Minimally important difference is 5 units  CAT: CAT can identify the health impairment of COPD patients and is better correlated with disease progression.  CAT has a scoring range of zero to 40. The CAT score is classified into four groups of low (less than 10), medium (10 - 20), high (21-30) and very high (31-40) based on the impact  level of disease on health status. A CAT score over 10 suggests significant symptoms.  A worsening CAT score could be explained by an exacerbation, poor medication adherence, poor inhaler technique, or progression of COPD or comorbid conditions.  CAT MCID is 2 points  mMRC: mMRC (Modified Medical Research Council) Dyspnea Scale is used to assess the degree of baseline functional disability in patients of respiratory disease due to dyspnea. No minimal important difference is established. A decrease in score of 1 point or greater is considered a positive change.   Pulmonary Function Assessment:  Pulmonary Function Assessment - 09/13/23 1102       Breath   Bilateral Breath Sounds Clear    Shortness of Breath Yes;Fear of Shortness of Breath;Limiting activity          Exercise Target Goals: Exercise Program Goal: Individual exercise prescription set using results from initial 6 min walk test and THRR while considering  patient's activity barriers and safety.   Exercise Prescription Goal: Initial exercise prescription builds to 30-45 minutes a day of aerobic activity, 2-3 days per week.  Home exercise guidelines will be given to patient during program as part of exercise prescription that the participant will acknowledge.  Activity Barriers & Risk Stratification:  Activity Barriers & Cardiac Risk Stratification - 09/13/23 1100       Activity Barriers & Cardiac Risk Stratification   Activity Barriers Deconditioning;Muscular Weakness;Shortness of Breath;Fibromyalgia;Balance Concerns    Cardiac Risk Stratification Moderate          6 Minute Walk:  6 Minute Walk     Row Name 09/13/23 1147         6 Minute Walk   Phase Initial     Distance 1021 feet     Walk Time 6 minutes     # of Rest Breaks 0     MPH 1.93     METS 1.81     RPE 12     Perceived Dyspnea  1     VO2 Peak 6.34     Symptoms No     Resting HR 85 bpm     Resting BP 126/60     Resting Oxygen  Saturation  95 %      Exercise Oxygen  Saturation  during 6 min walk 91 %     Max Ex. HR 89 bpm     Max Ex. BP 126/60     2 Minute Post BP 112/60       Interval HR   1 Minute HR 79     2 Minute HR 83     3 Minute HR 89     4 Minute HR 87     5 Minute HR 79     6 Minute HR 82     2 Minute Post HR 77     Interval Heart Rate? Yes       Interval Oxygen    Interval Oxygen ? Yes     Baseline Oxygen  Saturation % 95 %     1 Minute Oxygen  Saturation % 91 %     1 Minute Liters of Oxygen  0 L     2 Minute Oxygen  Saturation % 95 %     2 Minute Liters of Oxygen  0 L     3 Minute Oxygen  Saturation % 91 %     3 Minute Liters of Oxygen  0 L     4 Minute Oxygen  Saturation % 96 %     4 Minute Liters of Oxygen  0 L     5 Minute Oxygen  Saturation % 93 %     5 Minute Liters of Oxygen  0 L     6 Minute Oxygen  Saturation % 91 %     6  Minute Liters of Oxygen  0 L     2 Minute Post Oxygen  Saturation % 98 %     2 Minute Post Liters of Oxygen  0 L        Oxygen  Initial Assessment:  Oxygen  Initial Assessment - 09/13/23 1102       Home Oxygen    Home Oxygen  Device Home Concentrator    Sleep Oxygen  Prescription Continuous    Liters per minute 2.5    Home Exercise Oxygen  Prescription None    Home Resting Oxygen  Prescription None    Compliance with Home Oxygen  Use Yes      Initial 6 min Walk   Oxygen  Used None      Program Oxygen  Prescription   Program Oxygen  Prescription None      Intervention   Short Term Goals To learn and exhibit compliance with exercise, home and travel O2 prescription;To learn and understand importance of monitoring SPO2 with pulse oximeter and demonstrate accurate use of the pulse oximeter.;To learn and understand importance of maintaining oxygen  saturations>88%;To learn and demonstrate proper pursed lip breathing techniques or other breathing techniques. ;To learn and demonstrate proper use of respiratory medications    Long  Term Goals Exhibits compliance with exercise, home  and travel O2  prescription;Maintenance of O2 saturations>88%;Compliance with respiratory medication;Verbalizes importance of monitoring SPO2 with pulse oximeter and return demonstration;Exhibits proper breathing techniques, such as pursed lip breathing or other method taught during program session;Demonstrates proper use of MDI's          Oxygen  Re-Evaluation:   Oxygen  Discharge (Final Oxygen  Re-Evaluation):   Initial Exercise Prescription:  Initial Exercise Prescription - 09/13/23 1100       Date of Initial Exercise RX and Referring Provider   Date 09/13/23    Referring Provider [name]    Expected Discharge Date 12/09/23      Treadmill   MPH 1.4    Grade 0    Minutes 15    METs 2.07      Bike   Level 1    Watts 50    Minutes 15    METs 2      Prescription Details   Frequency (times per week) 2    Duration Progress to 30 minutes of continuous aerobic without signs/symptoms of physical distress      Intensity   THRR 40-80% of Max Heartrate 56-113    Ratings of Perceived Exertion 11-13    Perceived Dyspnea 0-4      Progression   Progression Continue to progress workloads to maintain intensity without signs/symptoms of physical distress.      Resistance Training   Training Prescription Yes    Weight blue bands    Reps 10-15          Perform Capillary Blood Glucose checks as needed.  Exercise Prescription Changes:   Exercise Comments:   Exercise Goals and Review:   Exercise Goals     Row Name 09/13/23 1039             Exercise Goals   Increase Physical Activity Yes       Intervention Provide advice, education, support and counseling about physical activity/exercise needs.;Develop an individualized exercise prescription for aerobic and resistive training based on initial evaluation findings, risk stratification, comorbidities and participant's personal goals.       Expected Outcomes Short Term: Attend rehab on a regular basis to increase amount of physical  activity.;Long Term: Add in home exercise to make exercise part of routine and  to increase amount of physical activity.;Long Term: Exercising regularly at least 3-5 days a week.       Increase Strength and Stamina Yes       Intervention Provide advice, education, support and counseling about physical activity/exercise needs.;Develop an individualized exercise prescription for aerobic and resistive training based on initial evaluation findings, risk stratification, comorbidities and participant's personal goals.       Expected Outcomes Short Term: Increase workloads from initial exercise prescription for resistance, speed, and METs.;Short Term: Perform resistance training exercises routinely during rehab and add in resistance training at home;Long Term: Improve cardiorespiratory fitness, muscular endurance and strength as measured by increased METs and functional capacity ( )       Able to understand and use rate of perceived exertion (RPE) scale Yes       Intervention Provide education and explanation on how to use RPE scale       Expected Outcomes Short Term: Able to use RPE daily in rehab to express subjective intensity level;Long Term:  Able to use RPE to guide intensity level when exercising independently       Able to understand and use Dyspnea scale Yes       Intervention Provide education and explanation on how to use Dyspnea scale       Expected Outcomes Short Term: Able to use Dyspnea scale daily in rehab to express subjective sense of shortness of breath during exertion;Long Term: Able to use Dyspnea scale to guide intensity level when exercising independently       Knowledge and understanding of Target Heart Rate Range (THRR) Yes       Intervention Provide education and explanation of THRR including how the numbers were predicted and where they are located for reference       Expected Outcomes Short Term: Able to state/look up THRR;Long Term: Able to use THRR to govern intensity when  exercising independently;Short Term: Able to use daily as guideline for intensity in rehab       Understanding of Exercise Prescription Yes       Intervention Provide education, explanation, and written materials on patient's individual exercise prescription       Expected Outcomes Short Term: Able to explain program exercise prescription;Long Term: Able to explain home exercise prescription to exercise independently          Exercise Goals Re-Evaluation :   Discharge Exercise Prescription (Final Exercise Prescription Changes):   Nutrition:  Target Goals: Understanding of nutrition guidelines, daily intake of sodium 1500mg , cholesterol 200mg , calories 30% from fat and 7% or less from saturated fats, daily to have 5 or more servings of fruits and vegetables.  Biometrics:  Pre Biometrics - 09/13/23 1151       Pre Biometrics   Grip Strength 25 kg           Nutrition Therapy Plan and Nutrition Goals:   Nutrition Assessments:  MEDIFICTS Score Key: >=70 Need to make dietary changes  40-70 Heart Healthy Diet <= 40 Therapeutic Level Cholesterol Diet   Picture Your Plate Scores: <59 Unhealthy dietary pattern with much room for improvement. 41-50 Dietary pattern unlikely to meet recommendations for good health and room for improvement. 51-60 More healthful dietary pattern, with some room for improvement.  >60 Healthy dietary pattern, although there may be some specific behaviors that could be improved.    Nutrition Goals Re-Evaluation:   Nutrition Goals Discharge (Final Nutrition Goals Re-Evaluation):   Psychosocial: Target Goals: Acknowledge presence or absence of significant depression  and/or stress, maximize coping skills, provide positive support system. Participant is able to verbalize types and ability to use techniques and skills needed for reducing stress and depression.  Initial Review & Psychosocial Screening:  Initial Psych Review & Screening - 09/13/23 1058        Initial Review   Current issues with None Identified      Family Dynamics   Good Support System? Yes      Barriers   Psychosocial barriers to participate in program There are no identifiable barriers or psychosocial needs.      Screening Interventions   Interventions Encouraged to exercise    Expected Outcomes Short Term goal: Identification and review with participant of any Quality of Life or Depression concerns found by scoring the questionnaire.;Long Term goal: The participant improves quality of Life and PHQ9 Scores as seen by post scores and/or verbalization of changes          Quality of Life Scores:  Scores of 19 and below usually indicate a poorer quality of life in these areas.  A difference of  2-3 points is a clinically meaningful difference.  A difference of 2-3 points in the total score of the Quality of Life Index has been associated with significant improvement in overall quality of life, self-image, physical symptoms, and general health in studies assessing change in quality of life.  PHQ-9: Review Flowsheet       09/13/2023  Depression screen PHQ 2/9  Decreased Interest 0  Down, Depressed, Hopeless 0  PHQ - 2 Score 0  Altered sleeping 0  Tired, decreased energy 3  Change in appetite 0  Feeling bad or failure about yourself  0  Trouble concentrating 1  Moving slowly or fidgety/restless 0  Suicidal thoughts 0  PHQ-9 Score 4  Difficult doing work/chores Not difficult at all   Interpretation of Total Score  Total Score Depression Severity:  1-4 = Minimal depression, 5-9 = Mild depression, 10-14 = Moderate depression, 15-19 = Moderately severe depression, 20-27 = Severe depression   Psychosocial Evaluation and Intervention:  Psychosocial Evaluation - 09/13/23 1131       Psychosocial Evaluation & Interventions   Interventions Encouraged to exercise with the program and follow exercise prescription    Comments Tom's initial PHQ 2-9 score 0/4. Charlena  denies any psychosocial barriers or concerns. No needs at this time.    Expected Outcomes For Tom to participate in PR free of any psychosocial barriers or concerns.    Continue Psychosocial Services  No Follow up required          Psychosocial Re-Evaluation:   Psychosocial Discharge (Final Psychosocial Re-Evaluation):   Education: Education Goals: Education classes will be provided on a weekly basis, covering required topics. Participant will state understanding/return demonstration of topics presented.  Learning Barriers/Preferences:  Learning Barriers/Preferences - 09/13/23 1058       Learning Barriers/Preferences   Learning Barriers Hearing   wears hearing aids   Learning Preferences None          Education Topics: Know Your Numbers Group instruction that is supported by a PowerPoint presentation. Instructor discusses importance of knowing and understanding resting, exercise, and post-exercise oxygen  saturation, heart rate, and blood pressure. Oxygen  saturation, heart rate, blood pressure, rating of perceived exertion, and dyspnea are reviewed along with a normal range for these values.    Exercise for the Pulmonary Patient Group instruction that is supported by a PowerPoint presentation. Instructor discusses benefits of exercise, core components of exercise, frequency,  duration, and intensity of an exercise routine, importance of utilizing pulse oximetry during exercise, safety while exercising, and options of places to exercise outside of rehab.    MET Level  Group instruction provided by PowerPoint, verbal discussion, and written material to support subject matter. Instructor reviews what METs are and how to increase METs.    Pulmonary Medications Verbally interactive group education provided by instructor with focus on inhaled medications and proper administration.   Anatomy and Physiology of the Respiratory System Group instruction provided by PowerPoint,  verbal discussion, and written material to support subject matter. Instructor reviews respiratory cycle and anatomical components of the respiratory system and their functions. Instructor also reviews differences in obstructive and restrictive respiratory diseases with examples of each.    Oxygen  Safety Group instruction provided by PowerPoint, verbal discussion, and written material to support subject matter. There is an overview of "What is Oxygen " and "Why do we need it".  Instructor also reviews how to create a safe environment for oxygen  use, the importance of using oxygen  as prescribed, and the risks of noncompliance. There is a brief discussion on traveling with oxygen  and resources the patient may utilize.   Oxygen  Use Group instruction provided by PowerPoint, verbal discussion, and written material to discuss how supplemental oxygen  is prescribed and different types of oxygen  supply systems. Resources for more information are provided.    Breathing Techniques Group instruction that is supported by demonstration and informational handouts. Instructor discusses the benefits of pursed lip and diaphragmatic breathing and detailed demonstration on how to perform both.     Risk Factor Reduction Group instruction that is supported by a PowerPoint presentation. Instructor discusses the definition of a risk factor, different risk factors for pulmonary disease, and how the heart and lungs work together.   Pulmonary Diseases Group instruction provided by PowerPoint, verbal discussion, and written material to support subject matter. Instructor gives an overview of the different type of pulmonary diseases. There is also a discussion on risk factors and symptoms as well as ways to manage the diseases.   Stress and Energy Conservation Group instruction provided by PowerPoint, verbal discussion, and written material to support subject matter. Instructor gives an overview of stress and the impact it  can have on the body. Instructor also reviews ways to reduce stress. There is also a discussion on energy conservation and ways to conserve energy throughout the day.   Warning Signs and Symptoms Group instruction provided by PowerPoint, verbal discussion, and written material to support subject matter. Instructor reviews warning signs and symptoms of stroke, heart attack, cold and flu. Instructor also reviews ways to prevent the spread of infection.   Other Education Group or individual verbal, written, or video instructions that support the educational goals of the pulmonary rehab program.    Knowledge Questionnaire Score:  Knowledge Questionnaire Score - 09/13/23 1041       Knowledge Questionnaire Score   Pre Score 11/18          Core Components/Risk Factors/Patient Goals at Admission:  Personal Goals and Risk Factors at Admission - 09/13/23 1059       Core Components/Risk Factors/Patient Goals on Admission    Weight Management Yes;Weight Loss    Intervention Weight Management: Develop a combined nutrition and exercise program designed to reach desired caloric intake, while maintaining appropriate intake of nutrient and fiber, sodium and fats, and appropriate energy expenditure required for the weight goal.;Weight Management: Provide education and appropriate resources to help participant work on and  attain dietary goals.;Weight Management/Obesity: Establish reasonable short term and long term weight goals.;Obesity: Provide education and appropriate resources to help participant work on and attain dietary goals.    Admit Weight 201 lb 4.5 oz (91.3 kg)    Expected Outcomes Short Term: Continue to assess and modify interventions until short term weight is achieved;Long Term: Adherence to nutrition and physical activity/exercise program aimed toward attainment of established weight goal;Weight Loss: Understanding of general recommendations for a balanced deficit meal plan, which  promotes 1-2 lb weight loss per week and includes a negative energy balance of [phone] kcal/d;Understanding recommendations for meals to include 15-35% energy as protein, 25-35% energy from fat, 35-60% energy from carbohydrates, less than 200mg  of dietary cholesterol, 20-35 gm of total fiber daily;Understanding of distribution of calorie intake throughout the day with the consumption of 4-5 meals/snacks    Improve shortness of breath with ADL's Yes    Intervention Provide education, individualized exercise plan and daily activity instruction to help decrease symptoms of SOB with activities of daily living.    Expected Outcomes Short Term: Improve cardiorespiratory fitness to achieve a reduction of symptoms when performing ADLs;Long Term: Be able to perform more ADLs without symptoms or delay the onset of symptoms          Core Components/Risk Factors/Patient Goals Review:    Core Components/Risk Factors/Patient Goals at Discharge (Final Review):    ITP Comments:   Comments: Dr. Slater Staff is Medical Director for Pulmonary Rehab at [facility] [facility].

## 2023-09-13 NOTE — Progress Notes (Signed)
 Joseph Soto 78 y.o. male Pulmonary Rehab Orientation Note This patient who was referred to Pulmonary Rehab by Dr. [name] with the diagnosis of COPD 2 arrived today in Cardiac and Pulmonary Rehab. He arrived ambulatory with normal gait. He does not carry portable oxygen . [employer] is the provider for their DME. Per patient, Joseph Soto uses oxygen  at night after sleep, at night when going to sleep. Color good, skin warm and dry. Patient is oriented to time and place. Patient's medical history, psychosocial health, and medications reviewed. Psychosocial assessment reveals patient lives with spouse. Joseph Soto is currently retired. Patient hobbies include gardening. Patient reports his stress level is low. Areas of stress/anxiety include health. Patient does not exhibit signs of depression.  PHQ2/9 score 0/4. Joseph Soto shows good  coping skills with positive outlook on life. Offered emotional support and reassurance. Will continue to monitor. Physical assessment performed by Nurse pick: Joseph Levin RN. Please see their orientation physical assessment note. Joseph Soto reports he  does take medications as prescribed. Patient states he  follows a regular  diet. The patient states he would like to lose weight. Patient's weight will be monitored closely. Demonstration and practice of PLB using pulse oximeter. Joseph Soto able to return demonstration satisfactorily. Safety and hand hygiene in the exercise area reviewed with patient. Joseph Soto voices understanding of the information reviewed. Department expectations discussed with patient and achievable goals were set. The patient shows enthusiasm about attending the program and we look forward to working with Joseph Soto. Joseph Soto completed a 6 min walk test today and is scheduled to begin exercise on 09/21/23.   [id number] Joseph Soto, BSRT

## 2023-09-13 NOTE — Progress Notes (Signed)
 Joseph Soto 78 y.o. male  Initial Psychosocial Assessment  Pt psychosocial assessment reveals pt lives with their spouse. Pt is currently retired. Pt hobbies include gardening. Pt reports his  stress level is low. Areas of stress/anxiety include health.  Pt does not exhibit signs of depression.  Pt shows good  coping skills with positive outlook . Offered emotional support and reassurance. Monitor and evaluate progress toward psychosocial goal(s).  Goal(s): Improved coping skills Help patient work toward returning to meaningful activities that improve patient's QOL and are attainable with patient's lung disease   09/13/2023 11:37 AM

## 2023-09-15 NOTE — Progress Notes (Signed)
 Pulmonary Individual Treatment Plan  Patient Details  Name: Joseph Soto MRN: [id number] Date of Birth: 08-19-1945 Referring Provider:   [name] Pulmonary Rehab Walk Test from 09/13/2023 in [facility]  Referring Provider [name]    Initial Encounter Date:  Flowsheet Row Pulmonary Rehab Walk Test from 09/13/2023 in [facility]  Date 09/13/23    Visit Diagnosis: Stage 2 moderate COPD by GOLD classification (HCC)  Patient's Home Medications on Admission:   Current Outpatient Medications:    acetaminophen  (TYLENOL ) 500 MG tablet, Take 500-1,000 mg by mouth every 6 (six) hours as needed (pain.)., Disp: , Rfl:    albuterol  (PROVENTIL ) (2.5 MG/3ML) 0.083% nebulizer solution, Take 3 mLs (2.5 mg total) by nebulization every 6 (six) hours as needed for wheezing or shortness of breath., Disp: 75 mL, Rfl: 12   albuterol  (VENTOLIN  HFA) 108 (90 Base) MCG/ACT inhaler, Inhale into the lungs every 6 (six) hours as needed for wheezing or shortness of breath., Disp: , Rfl:    amLODipine  (NORVASC ) 5 MG tablet, Take 5 mg by mouth at bedtime., Disp: , Rfl:    aspirin  EC 81 MG tablet, Take 81 mg by mouth at bedtime., Disp: , Rfl:    cholecalciferol  (VITAMIN D ) 1000 units tablet, Take 2,000 Units by mouth daily with breakfast., Disp: , Rfl:    clopidogrel  (PLAVIX ) 75 MG tablet, TAKE 1 TABLET BY MOUTH EVERY DAY WITH BREAKFAST, Disp: 90 tablet, Rfl: 3   Coenzyme Q10 300 MG CAPS, Take 300 mg by mouth daily., Disp: , Rfl:    cyclobenzaprine  (FLEXERIL ) 10 MG tablet, Take 10 mg by mouth 2 (two) times daily., Disp: , Rfl:    doxycycline (VIBRA-TABS) 100 MG tablet, 1 tablet Orally twice a day for 10 days, Disp: , Rfl:    ezetimibe  (ZETIA ) 10 MG tablet, TAKE 1 TABLET BY MOUTH EVERY DAY, Disp: 90 tablet, Rfl: 1   fluticasone  (FLONASE ) 50 MCG/ACT nasal spray, Place 1 spray into both nostrils daily as  needed for rhinitis or allergies., Disp: , Rfl:    meloxicam  (MOBIC ) 15 MG tablet, Take 15 mg by mouth daily., Disp: , Rfl:    metoprolol  succinate (TOPROL -XL) 25 MG 24 hr tablet, Take 1 tablet (25 mg total) by mouth in the morning and at bedtime., Disp: 180 tablet, Rfl: 3   naltrexone  (DEPADE) 50 MG tablet, Take 50 mg by mouth 2 (two) times daily., Disp: , Rfl:    nitroGLYCERIN  (NITROSTAT ) 0.4 MG SL tablet, Place 1 tablet (0.4 mg total) under the tongue every 5 (five) minutes as needed for chest pain., Disp: 25 tablet, Rfl: 3   OHTUVAYRE  3 MG/2.5ML SUSP, Inhale 3 mg into the lungs 2 (two) times daily., Disp: , Rfl:    predniSONE  (DELTASONE ) 20 MG tablet, Take 20 mg by mouth daily., Disp: , Rfl:    rosuvastatin  (CRESTOR ) 40 MG tablet, Take 1 tablet (40 mg total) by mouth daily., Disp: 90 tablet, Rfl: 0   TRELEGY ELLIPTA 200-62.5-25 MCG/ACT AEPB, Inhale 1 puff into the lungs daily., Disp: , Rfl:    azithromycin (ZITHROMAX) 250 MG tablet, Take by mouth. (Patient not taking: Reported on 09/13/2023), Disp: , Rfl:    isosorbide  mononitrate (IMDUR ) 30 MG 24 hr tablet, Take 1 tablet (30 mg total) by mouth daily. Please schedule yearly appointment for future refills. 1st attempt. Thank you, Disp: 45 tablet, Rfl: 0  Past Medical History: Past  Medical History:  Diagnosis Date   Allergic rhinitis    grass, dust   Buttock pain    CAD (coronary artery disease)    Last cath 2010 per Dr. 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 Arnita office)   Myeloma [facility])    found it in my chest when they did the excision   Myocardial infarction (HCC) 06/2005   Non Hodgkin's lymphoma (HCC) dx'd 06/2005    Tobacco Use: Social History   Tobacco Use  Smoking Status Former   Current packs/day: 0.00   Average packs/day: 2.0 packs/day for 30.0 years (60.0 ttl pk-yrs)   Types: Cigarettes   Start date: 05/17/1970   Quit date: 05/16/2000   Years since quitting: 23.3  Smokeless Tobacco Never  Tobacco Comments   quit 06/2005    Labs: Review Flowsheet  More data exists      Latest Ref Rng & Units 09/14/2011 11/30/2011 05/02/2012 12/23/2015 01/24/2018  Labs for ITP Cardiac and Pulmonary Rehab  Cholestrol 100 - 199 mg/dL 882  828  818  - 883   LDL (calc) 0 - 99 mg/dL 53  890  875  - 52   HDL-C >39 mg/dL 47.29  47.19  54.29  - 46   Trlycerides 0 - 149 mg/dL 41.9  53.9  40.9  - 90   TCO2 0 - 100 mmol/L - - - 22  -    Capillary Blood Glucose: Lab Results  Component Value Date   GLUCAP 97 01/24/2013   GLUCAP 86 01/24/2013   GLUCAP 89 01/24/2013     Pulmonary Assessment Scores:  Pulmonary Assessment Scores     Row Name 09/13/23 1127         ADL UCSD   ADL Phase Entry     SOB Score total 41       CAT Score   CAT Score 21       mMRC Score   mMRC Score 2       UCSD: Self-administered rating of dyspnea associated with activities of daily living (ADLs) 6-point scale (0 = not at all to 5 = maximal or unable to do because of breathlessness)  Scoring Scores range from 0 to 120.  Minimally important difference is 5 units  CAT: CAT can identify the health impairment of COPD patients and is better correlated with disease progression.  CAT has a scoring range of zero to 40. The CAT score is classified into four groups of low (less than 10), medium (10 - 20), high (21-30) and very high (31-40) based on the impact  level of disease on health status. A CAT score over 10 suggests significant symptoms.  A worsening CAT score could be explained by an exacerbation, poor medication adherence, poor inhaler technique, or progression of COPD or comorbid conditions.  CAT MCID is 2 points  mMRC: mMRC (Modified Medical Research Council) Dyspnea Scale is used to assess the degree of baseline functional disability in patients of respiratory disease due to dyspnea. No minimal important difference is established. A decrease in score of 1 point or greater is considered a positive change.   Pulmonary Function Assessment:  Pulmonary Function Assessment - 09/13/23 1102       Breath   Bilateral Breath Sounds Clear    Shortness of Breath Yes;Fear of Shortness of Breath;Limiting activity          Exercise Target Goals: Exercise Program Goal: Individual exercise prescription set using results from initial 6 min walk test and THRR while considering  patient's activity barriers and safety.   Exercise Prescription Goal: Initial exercise prescription builds to 30-45 minutes a day of aerobic activity, 2-3 days per week.  Home exercise guidelines will be given to patient during program as part of exercise prescription that the participant will acknowledge.  Activity Barriers & Risk Stratification:  Activity Barriers & Cardiac Risk Stratification - 09/13/23 1100       Activity Barriers & Cardiac Risk Stratification   Activity Barriers Deconditioning;Muscular Weakness;Shortness of Breath;Fibromyalgia;Balance Concerns    Cardiac Risk Stratification Moderate          6 Minute Walk:  6 Minute Walk     Row Name 09/13/23 1147         6 Minute Walk   Phase Initial     Distance 1021 feet     Walk Time 6 minutes     # of Rest Breaks 0     MPH 1.93     METS 1.81     RPE 12     Perceived Dyspnea  1     VO2 Peak 6.34     Symptoms No     Resting HR 85 bpm     Resting BP 126/60     Resting Oxygen  Saturation  95 %      Exercise Oxygen  Saturation  during 6 min walk 91 %     Max Ex. HR 89 bpm     Max Ex. BP 126/60     2 Minute Post BP 112/60       Interval HR   1 Minute HR 79     2 Minute HR 83     3 Minute HR 89     4 Minute HR 87     5 Minute HR 79     6 Minute HR 82     2 Minute Post HR 77     Interval Heart Rate? Yes       Interval Oxygen    Interval Oxygen ? Yes     Baseline Oxygen  Saturation % 95 %     1 Minute Oxygen  Saturation % 91 %     1 Minute Liters of Oxygen  0 L     2 Minute Oxygen  Saturation % 95 %     2 Minute Liters of Oxygen  0 L     3 Minute Oxygen  Saturation % 91 %     3 Minute Liters of Oxygen  0 L     4 Minute Oxygen  Saturation % 96 %     4 Minute Liters of Oxygen  0 L     5 Minute Oxygen  Saturation % 93 %     5 Minute Liters of Oxygen  0 L     6 Minute Oxygen  Saturation % 91 %     6  Minute Liters of Oxygen  0 L     2 Minute Post Oxygen  Saturation % 98 %     2 Minute Post Liters of Oxygen  0 L        Oxygen  Initial Assessment:  Oxygen  Initial Assessment - 09/13/23 1102       Home Oxygen    Home Oxygen  Device Home Concentrator    Sleep Oxygen  Prescription Continuous    Liters per minute 2.5    Home Exercise Oxygen  Prescription None    Home Resting Oxygen  Prescription None    Compliance with Home Oxygen  Use Yes      Initial 6 min Walk   Oxygen  Used None      Program Oxygen  Prescription   Program Oxygen  Prescription None      Intervention   Short Term Goals To learn and exhibit compliance with exercise, home and travel O2 prescription;To learn and understand importance of monitoring SPO2 with pulse oximeter and demonstrate accurate use of the pulse oximeter.;To learn and understand importance of maintaining oxygen  saturations>88%;To learn and demonstrate proper pursed lip breathing techniques or other breathing techniques. ;To learn and demonstrate proper use of respiratory medications    Long  Term Goals Exhibits compliance with exercise, home  and travel O2  prescription;Maintenance of O2 saturations>88%;Compliance with respiratory medication;Verbalizes importance of monitoring SPO2 with pulse oximeter and return demonstration;Exhibits proper breathing techniques, such as pursed lip breathing or other method taught during program session;Demonstrates proper use of MDI's          Oxygen  Re-Evaluation:  Oxygen  Re-Evaluation     Row Name 09/13/23 1102             Goals/Expected Outcomes   Goals/Expected Outcomes Compliance and understanding of oxygen  saturation monitoring and breathing techniques to decrease shortness of breath.          Oxygen  Discharge (Final Oxygen  Re-Evaluation):  Oxygen  Re-Evaluation - 09/13/23 1102       Goals/Expected Outcomes   Goals/Expected Outcomes Compliance and understanding of oxygen  saturation monitoring and breathing techniques to decrease shortness of breath.          Initial Exercise Prescription:  Initial Exercise Prescription - 09/13/23 1100       Date of Initial Exercise RX and Referring Provider   Date 09/13/23    Referring Provider [name]    Expected Discharge Date 12/09/23      Treadmill   MPH 1.4    Grade 0    Minutes 15    METs 2.07      Bike   Level 1    Watts 50    Minutes 15    METs 2      Prescription Details   Frequency (times per week) 2    Duration Progress to 30 minutes of continuous aerobic without signs/symptoms of physical distress      Intensity   THRR 40-80% of Max Heartrate 56-113    Ratings of Perceived Exertion 11-13    Perceived Dyspnea 0-4      Progression   Progression Continue to progress workloads to maintain intensity without signs/symptoms of physical distress.      Resistance Training   Training Prescription Yes    Weight blue bands    Reps 10-15          Perform Capillary Blood Glucose checks as needed.  Exercise Prescription Changes:   Exercise Comments:   Exercise Goals and Review:   Exercise Goals     Row Name 09/13/23  1039  Exercise Goals   Increase Physical Activity Yes       Intervention Provide advice, education, support and counseling about physical activity/exercise needs.;Develop an individualized exercise prescription for aerobic and resistive training based on initial evaluation findings, risk stratification, comorbidities and participant's personal goals.       Expected Outcomes Short Term: Attend rehab on a regular basis to increase amount of physical activity.;Long Term: Add in home exercise to make exercise part of routine and to increase amount of physical activity.;Long Term: Exercising regularly at least 3-5 days a week.       Increase Strength and Stamina Yes       Intervention Provide advice, education, support and counseling about physical activity/exercise needs.;Develop an individualized exercise prescription for aerobic and resistive training based on initial evaluation findings, risk stratification, comorbidities and participant's personal goals.       Expected Outcomes Short Term: Increase workloads from initial exercise prescription for resistance, speed, and METs.;Short Term: Perform resistance training exercises routinely during rehab and add in resistance training at home;Long Term: Improve cardiorespiratory fitness, muscular endurance and strength as measured by increased METs and functional capacity ( )       Able to understand and use rate of perceived exertion (RPE) scale Yes       Intervention Provide education and explanation on how to use RPE scale       Expected Outcomes Short Term: Able to use RPE daily in rehab to express subjective intensity level;Long Term:  Able to use RPE to guide intensity level when exercising independently       Able to understand and use Dyspnea scale Yes       Intervention Provide education and explanation on how to use Dyspnea scale       Expected Outcomes Short Term: Able to use Dyspnea scale daily in rehab to express subjective sense  of shortness of breath during exertion;Long Term: Able to use Dyspnea scale to guide intensity level when exercising independently       Knowledge and understanding of Target Heart Rate Range (THRR) Yes       Intervention Provide education and explanation of THRR including how the numbers were predicted and where they are located for reference       Expected Outcomes Short Term: Able to state/look up THRR;Long Term: Able to use THRR to govern intensity when exercising independently;Short Term: Able to use daily as guideline for intensity in rehab       Understanding of Exercise Prescription Yes       Intervention Provide education, explanation, and written materials on patient's individual exercise prescription       Expected Outcomes Short Term: Able to explain program exercise prescription;Long Term: Able to explain home exercise prescription to exercise independently          Exercise Goals Re-Evaluation :  Exercise Goals Re-Evaluation     Row Name 09/13/23 1202             Exercise Goal Re-Evaluation   Exercise Goals Review Increase Physical Activity;Able to understand and use Dyspnea scale;Understanding of Exercise Prescription;Increase Strength and Stamina;Knowledge and understanding of Target Heart Rate Range (THRR);Able to understand and use rate of perceived exertion (RPE) scale       Comments Pt is scheduled to begin exercise on 8/12. Will continue to monitor and progress as able.       Expected Outcomes Through exercise at rehab and home, the patient will decrease shortness of breath with daily activities and  feel confident in carrying out an exercise regimen at home.          Discharge Exercise Prescription (Final Exercise Prescription Changes):   Nutrition:  Target Goals: Understanding of nutrition guidelines, daily intake of sodium 1500mg , cholesterol 200mg , calories 30% from fat and 7% or less from saturated fats, daily to have 5 or more servings of fruits and  vegetables.  Biometrics:  Pre Biometrics - 09/13/23 1151       Pre Biometrics   Grip Strength 25 kg           Nutrition Therapy Plan and Nutrition Goals:   Nutrition Assessments:  MEDIFICTS Score Key: >=70 Need to make dietary changes  40-70 Heart Healthy Diet <= 40 Therapeutic Level Cholesterol Diet   Picture Your Plate Scores: <59 Unhealthy dietary pattern with much room for improvement. 41-50 Dietary pattern unlikely to meet recommendations for good health and room for improvement. 51-60 More healthful dietary pattern, with some room for improvement.  >60 Healthy dietary pattern, although there may be some specific behaviors that could be improved.    Nutrition Goals Re-Evaluation:   Nutrition Goals Discharge (Final Nutrition Goals Re-Evaluation):   Psychosocial: Target Goals: Acknowledge presence or absence of significant depression and/or stress, maximize coping skills, provide positive support system. Participant is able to verbalize types and ability to use techniques and skills needed for reducing stress and depression.  Initial Review & Psychosocial Screening:  Initial Psych Review & Screening - 09/13/23 1058       Initial Review   Current issues with None Identified      Family Dynamics   Good Support System? Yes      Barriers   Psychosocial barriers to participate in program There are no identifiable barriers or psychosocial needs.      Screening Interventions   Interventions Encouraged to exercise    Expected Outcomes Short Term goal: Identification and review with participant of any Quality of Life or Depression concerns found by scoring the questionnaire.;Long Term goal: The participant improves quality of Life and PHQ9 Scores as seen by post scores and/or verbalization of changes          Quality of Life Scores:  Scores of 19 and below usually indicate a poorer quality of life in these areas.  A difference of  2-3 points is a clinically  meaningful difference.  A difference of 2-3 points in the total score of the Quality of Life Index has been associated with significant improvement in overall quality of life, self-image, physical symptoms, and general health in studies assessing change in quality of life.  PHQ-9: Review Flowsheet       09/13/2023  Depression screen PHQ 2/9  Decreased Interest 0  Down, Depressed, Hopeless 0  PHQ - 2 Score 0  Altered sleeping 0  Tired, decreased energy 3  Change in appetite 0  Feeling bad or failure about yourself  0  Trouble concentrating 1  Moving slowly or fidgety/restless 0  Suicidal thoughts 0  PHQ-9 Score 4  Difficult doing work/chores Not difficult at all   Interpretation of Total Score  Total Score Depression Severity:  1-4 = Minimal depression, 5-9 = Mild depression, 10-14 = Moderate depression, 15-19 = Moderately severe depression, 20-27 = Severe depression   Psychosocial Evaluation and Intervention:  Psychosocial Evaluation - 09/13/23 1131       Psychosocial Evaluation & Interventions   Interventions Encouraged to exercise with the program and follow exercise prescription    Comments Joseph Soto's initial  PHQ 2-9 score 0/4. Joseph Soto denies any psychosocial barriers or concerns. No needs at this time.    Expected Outcomes For Joseph Soto to participate in PR free of any psychosocial barriers or concerns.    Continue Psychosocial Services  No Follow up required          Psychosocial Re-Evaluation:  Psychosocial Re-Evaluation     Row Name 09/14/23 [phone]             Psychosocial Re-Evaluation   Current issues with None Identified       Comments Monthly psychosocial re-evaulation is as follows: Joseph Soto denied any psy/soc barriers or concerns at orientation. He declined any additional resources or referrals. He is scheduled to start the program next week       Expected Outcomes For Joseph Soto to attend Pulm Rehab without any psy/soc concerns or barriers       Interventions Encouraged to attend  Pulmonary Rehabilitation for the exercise       Continue Psychosocial Services  No Follow up required          Psychosocial Discharge (Final Psychosocial Re-Evaluation):  Psychosocial Re-Evaluation - 09/14/23 9170       Psychosocial Re-Evaluation   Current issues with None Identified    Comments Monthly psychosocial re-evaulation is as follows: Joseph Soto denied any psy/soc barriers or concerns at orientation. He declined any additional resources or referrals. He is scheduled to start the program next week    Expected Outcomes For Joseph Soto to attend Pulm Rehab without any psy/soc concerns or barriers    Interventions Encouraged to attend Pulmonary Rehabilitation for the exercise    Continue Psychosocial Services  No Follow up required          Education: Education Goals: Education classes will be provided on a weekly basis, covering required topics. Participant will state understanding/return demonstration of topics presented.  Learning Barriers/Preferences:  Learning Barriers/Preferences - 09/13/23 1058       Learning Barriers/Preferences   Learning Barriers Hearing   wears hearing aids   Learning Preferences None          Education Topics: Know Your Numbers Group instruction that is supported by a PowerPoint presentation. Instructor discusses importance of knowing and understanding resting, exercise, and post-exercise oxygen  saturation, heart rate, and blood pressure. Oxygen  saturation, heart rate, blood pressure, rating of perceived exertion, and dyspnea are reviewed along with a normal range for these values.    Exercise for the Pulmonary Patient Group instruction that is supported by a PowerPoint presentation. Instructor discusses benefits of exercise, core components of exercise, frequency, duration, and intensity of an exercise routine, importance of utilizing pulse oximetry during exercise, safety while exercising, and options of places to exercise outside of rehab.    MET  Level  Group instruction provided by PowerPoint, verbal discussion, and written material to support subject matter. Instructor reviews what METs are and how to increase METs.    Pulmonary Medications Verbally interactive group education provided by instructor with focus on inhaled medications and proper administration.   Anatomy and Physiology of the Respiratory System Group instruction provided by PowerPoint, verbal discussion, and written material to support subject matter. Instructor reviews respiratory cycle and anatomical components of the respiratory system and their functions. Instructor also reviews differences in obstructive and restrictive respiratory diseases with examples of each.    Oxygen  Safety Group instruction provided by PowerPoint, verbal discussion, and written material to support subject matter. There is an overview of "What is Oxygen " and "Why do we need  it".  Instructor also reviews how to create a safe environment for oxygen  use, the importance of using oxygen  as prescribed, and the risks of noncompliance. There is a brief discussion on traveling with oxygen  and resources the patient may utilize.   Oxygen  Use Group instruction provided by PowerPoint, verbal discussion, and written material to discuss how supplemental oxygen  is prescribed and different types of oxygen  supply systems. Resources for more information are provided.    Breathing Techniques Group instruction that is supported by demonstration and informational handouts. Instructor discusses the benefits of pursed lip and diaphragmatic breathing and detailed demonstration on how to perform both.     Risk Factor Reduction Group instruction that is supported by a PowerPoint presentation. Instructor discusses the definition of a risk factor, different risk factors for pulmonary disease, and how the heart and lungs work together.   Pulmonary Diseases Group instruction provided by PowerPoint, verbal  discussion, and written material to support subject matter. Instructor gives an overview of the different type of pulmonary diseases. There is also a discussion on risk factors and symptoms as well as ways to manage the diseases.   Stress and Energy Conservation Group instruction provided by PowerPoint, verbal discussion, and written material to support subject matter. Instructor gives an overview of stress and the impact it can have on the body. Instructor also reviews ways to reduce stress. There is also a discussion on energy conservation and ways to conserve energy throughout the day.   Warning Signs and Symptoms Group instruction provided by PowerPoint, verbal discussion, and written material to support subject matter. Instructor reviews warning signs and symptoms of stroke, heart attack, cold and flu. Instructor also reviews ways to prevent the spread of infection.   Other Education Group or individual verbal, written, or video instructions that support the educational goals of the pulmonary rehab program.    Knowledge Questionnaire Score:  Knowledge Questionnaire Score - 09/13/23 1041       Knowledge Questionnaire Score   Pre Score 11/18          Core Components/Risk Factors/Patient Goals at Admission:  Personal Goals and Risk Factors at Admission - 09/13/23 1059       Core Components/Risk Factors/Patient Goals on Admission    Weight Management Yes;Weight Loss    Intervention Weight Management: Develop a combined nutrition and exercise program designed to reach desired caloric intake, while maintaining appropriate intake of nutrient and fiber, sodium and fats, and appropriate energy expenditure required for the weight goal.;Weight Management: Provide education and appropriate resources to help participant work on and attain dietary goals.;Weight Management/Obesity: Establish reasonable short term and long term weight goals.;Obesity: Provide education and appropriate resources  to help participant work on and attain dietary goals.    Admit Weight 201 lb 4.5 oz (91.3 kg)    Expected Outcomes Short Term: Continue to assess and modify interventions until short term weight is achieved;Long Term: Adherence to nutrition and physical activity/exercise program aimed toward attainment of established weight goal;Weight Loss: Understanding of general recommendations for a balanced deficit meal plan, which promotes 1-2 lb weight loss per week and includes a negative energy balance of [phone] kcal/d;Understanding recommendations for meals to include 15-35% energy as protein, 25-35% energy from fat, 35-60% energy from carbohydrates, less than 200mg  of dietary cholesterol, 20-35 gm of total fiber daily;Understanding of distribution of calorie intake throughout the day with the consumption of 4-5 meals/snacks    Improve shortness of breath with ADL's Yes    Intervention Provide education,  individualized exercise plan and daily activity instruction to help decrease symptoms of SOB with activities of daily living.    Expected Outcomes Short Term: Improve cardiorespiratory fitness to achieve a reduction of symptoms when performing ADLs;Long Term: Be able to perform more ADLs without symptoms or delay the onset of symptoms          Core Components/Risk Factors/Patient Goals Review:   Goals and Risk Factor Review     Row Name 09/14/23 0834             Core Components/Risk Factors/Patient Goals Review   Personal Goals Review Weight Management/Obesity;Improve shortness of breath with ADL's;Develop more efficient breathing techniques such as purse lipped breathing and diaphragmatic breathing and practicing self-pacing with activity.       Review Monthly re-evaluation of Core Components/Risk Factors/Patient Goals are as follows: Unable to assess Joseph Soto's goals yet. He is scheduled to start to the program next week       Expected Outcomes Pt will show progress toward meeting expected goals and  outcomes.          Core Components/Risk Factors/Patient Goals at Discharge (Final Review):   Goals and Risk Factor Review - 09/14/23 0834       Core Components/Risk Factors/Patient Goals Review   Personal Goals Review Weight Management/Obesity;Improve shortness of breath with ADL's;Develop more efficient breathing techniques such as purse lipped breathing and diaphragmatic breathing and practicing self-pacing with activity.    Review Monthly re-evaluation of Core Components/Risk Factors/Patient Goals are as follows: Unable to assess Joseph Soto's goals yet. He is scheduled to start to the program next week    Expected Outcomes Pt will show progress toward meeting expected goals and outcomes.          ITP Comments:Pt is making expected progress toward Pulmonary Rehab goals after completing 0 session(s). Recommend continued exercise, life style modification, education, and utilization of breathing techniques to increase stamina and strength, while also decreasing shortness of breath with exertion.  Dr. Slater Staff is Medical Director for Pulmonary Rehab at [facility].

## 2023-09-21 ENCOUNTER — Encounter (HOSPITAL_COMMUNITY)
Admission: RE | Admit: 2023-09-21 | Discharge: 2023-09-21 | Disposition: A | Discharge status: Home / Self Care | Source: Ambulatory Visit | Attending: Internal Medicine | Admitting: Internal Medicine

## 2023-09-21 DIAGNOSIS — J449 Chronic obstructive pulmonary disease, unspecified: Secondary | ICD-10-CM | POA: Diagnosis not present

## 2023-09-21 NOTE — Telephone Encounter (Signed)
 Will forward to our clinical pharmacists to see if they have any experience or know any research behind citrulline, I have not used this supplement in cardiac portion.

## 2023-09-21 NOTE — Progress Notes (Signed)
 Daily Session Note  Patient Details  Name: Joseph Soto MRN: [id number] Date of Birth: [birth year] Referring Provider:   [name] Pulmonary Rehab Walk Test from 09/13/2023 in [facility] for [facility], V[facility], & [facility]  Referring Provider [name]    Encounter Date: 09/21/2023  Check In:  Session Check In - 09/21/23 1203       Check-In   Supervising physician immediately available to respond to emergencies [facility] MD immediately available    Physician(s) [name], NP    Location [facility]    Staff Present Augustin Sharps, Neita Moats, MS, ACSM-CEP, Exercise Physiologist;Carlette Bernett, RN, BSN    Virtual Visit No    Medication changes reported     Yes    Comments med list reviewed    Fall or balance concerns reported    Yes    Comments Pt states his balance has gotten worse over the past couple of months    Tobacco Cessation No Change    Warm-up and Cool-down Performed as group-led instruction    Resistance Training Performed Yes    VAD Patient? No    PAD/SET Patient? No      Pain Assessment   Currently in Pain? No/denies    Multiple Pain Sites No          Capillary Blood Glucose: No results found for this or any previous visit (from the past 24 hours).    Social History   Tobacco Use  Smoking Status Former   Current packs/day: 0.00   Average packs/day: 2.0 packs/day for 30.0 years (60.0 ttl pk-yrs)   Types: Cigarettes   Start date: 05/17/1970   Quit date: 05/16/2000   Years since quitting: 23.3  Smokeless Tobacco Never  Tobacco Comments   quit 06/2005    Goals Met:  Proper associated with RPD/PD & O2 Sat Independence with exercise equipment Exercise tolerated well No report of concerns or symptoms today Strength training completed today  Goals Unmet:  Not Applicable  Comments: Service time is from 1006 to 1140.    Dr. Slater Staff is Medical Director for Pulmonary Rehab at [facility].

## 2023-09-23 ENCOUNTER — Encounter (HOSPITAL_COMMUNITY)
Admission: RE | Admit: 2023-09-23 | Discharge: 2023-09-23 | Disposition: A | Discharge status: Home / Self Care | Source: Ambulatory Visit | Attending: Internal Medicine | Admitting: Internal Medicine

## 2023-09-23 DIAGNOSIS — J449 Chronic obstructive pulmonary disease, unspecified: Secondary | ICD-10-CM | POA: Diagnosis not present

## 2023-09-23 NOTE — Progress Notes (Signed)
 Daily Session Note  Patient Details  Name: Joseph Soto MRN: [id number] Date of Birth: September 03, 1945 Referring Provider:   [name] Pulmonary Rehab Walk Test from 09/13/2023 in [facility], Vascular, & [facility]  Referring Provider [name]    Encounter Date: 09/23/2023  Check In:  Session Check In - 09/23/23 1157       Check-In   Supervising physician immediately available to respond to emergencies [facility] MD immediately available    Physician(s) Damien Braver, NP    [facility]    Staff Present Augustin Sharps, Neita Moats, MS, ACSM-CEP, Exercise Physiologist;Carlette Bernett, RN, Cathyann Levin, RN, BSN    Virtual Visit No    Medication changes reported     Yes    Comments med list reviewed    Fall or balance concerns reported    Yes    Comments Pt states his balance has gotten worse over the past couple of months    Tobacco Cessation No Change    Warm-up and Cool-down Performed as group-led instruction    Resistance Training Performed Yes    VAD Patient? No    PAD/SET Patient? No      Pain Assessment   Currently in Pain? No/denies    Multiple Pain Sites No          Capillary Blood Glucose: No results found for this or any previous visit (from the past 24 hours).    Social History   Tobacco Use  Smoking Status Former   Current packs/day: 0.00   Average packs/day: 2.0 packs/day for 30.0 years (60.0 ttl pk-yrs)   Types: Cigarettes   Start date: 05/17/1970   Quit date: 05/16/2000   Years since quitting: 23.3  Smokeless Tobacco Never  Tobacco Comments   quit 06/2005    Goals Met:  Exercise tolerated well Queuing for purse lip breathing No report of concerns or symptoms today Strength training completed today  Goals Unmet:  Not Applicable  Comments: Service time is from 1004 to 1119    Dr. Slater Staff is Medical Director for Pulmonary Rehab at [facility].

## 2023-09-27 ENCOUNTER — Other Ambulatory Visit: Payer: Self-pay | Admitting: Cardiovascular Disease

## 2023-09-28 ENCOUNTER — Encounter (HOSPITAL_COMMUNITY)
Admission: RE | Admit: 2023-09-28 | Discharge: 2023-09-28 | Disposition: A | Discharge status: Home / Self Care | Source: Ambulatory Visit | Attending: Internal Medicine | Admitting: Internal Medicine

## 2023-09-28 VITALS — Wt 199.1 lb

## 2023-09-28 DIAGNOSIS — J449 Chronic obstructive pulmonary disease, unspecified: Secondary | ICD-10-CM

## 2023-09-28 NOTE — Progress Notes (Signed)
 Daily Session Note  Patient Details  Name: Joseph Soto MRN: [id number] Date of Birth: October 22, 1945 Referring Provider:   [name] Pulmonary Rehab Walk Test from 09/13/2023 in [facility] for Heart, Vascular, & Lung Health  Referring Provider [name]    Encounter Date: 09/28/2023  Check In:  Session Check In - 09/28/23 1158       Check-In   Supervising physician immediately available to respond to emergencies [facility] MD immediately available    Physician(s) Lum Louis, NP    [facility]    Staff Present Augustin Sharps, Neita Moats, MS, ACSM-CEP, Exercise Physiologist;Carlette Bernett, RN, Cathyann Levin, RN, BSN    Virtual Visit No    Medication changes reported     No    Fall or balance concerns reported    Yes    Comments Pt states his balance has gotten worse over the past couple of months    Tobacco Cessation No Change    Warm-up and Cool-down Performed as group-led instruction    Resistance Training Performed Yes    VAD Patient? No    PAD/SET Patient? No      Pain Assessment   Currently in Pain? No/denies    Multiple Pain Sites No          Capillary Blood Glucose: No results found for this or any previous visit (from the past 24 hours).    Social History   Tobacco Use  Smoking Status Former   Current packs/day: 0.00   Average packs/day: 2.0 packs/day for 30.0 years (60.0 ttl pk-yrs)   Types: Cigarettes   Start date: 05/17/1970   Quit date: 05/16/2000   Years since quitting: 23.3  Smokeless Tobacco Never  Tobacco Comments   quit 06/2005    Goals Met:  Proper associated with RPD/PD & O2 Sat Independence with exercise equipment Exercise tolerated well No report of concerns or symptoms today Strength training completed today  Goals Unmet:  Not Applicable  Comments: Service time is from 1021 to 1139.    Dr. Slater Staff is Medical Director for Pulmonary Rehab at [facility].

## 2023-09-29 MED ORDER — CLOPIDOGREL BISULFATE 75 MG PO TABS: 75.0000 mg | ORAL_TABLET | Freq: Every day | ORAL | 3 refills | Status: AC

## 2023-09-30 ENCOUNTER — Encounter (HOSPITAL_COMMUNITY)
Admission: RE | Admit: 2023-09-30 | Discharge: 2023-09-30 | Discharge status: Home / Self Care | Source: Ambulatory Visit | Attending: Internal Medicine

## 2023-09-30 DIAGNOSIS — J449 Chronic obstructive pulmonary disease, unspecified: Secondary | ICD-10-CM

## 2023-09-30 NOTE — Progress Notes (Signed)
 Daily Session Note  Patient Details  Name: Joseph Soto MRN: [id number] Date of Birth: 02-11-1945 Referring Provider:   [name] Pulmonary Rehab Walk Test from 09/13/2023 in [facility] for [facility], Vascular, & Lung Health  Referring Provider [name]    Encounter Date: 09/30/2023  Check In:  Session Check In - 09/30/23 1036       Check-In   Supervising physician immediately available to respond to emergencies [facility] immediately available    Physician(s) Rosaline Bane, NP    [facility]    Staff Present Augustin Sharps, Neita Moats, MS, ACSM-CEP, Exercise Physiologist;Mary Bastin, RN, BSN;Rue Tinnel BS, ACSM-CEP, Exercise Physiologist    Virtual Visit No    Medication changes reported     No    Fall or balance concerns reported    Yes    Comments Pt states his balance has gotten worse over the past couple of months    Tobacco Cessation No Change    Warm-up and Cool-down Performed as group-led instruction    Resistance Training Performed Yes    VAD Patient? No    PAD/SET Patient? No      Pain Assessment   Currently in Pain? No/denies          Capillary Blood Glucose: No results found for this or any previous visit (from the past 24 hours).    Social History   Tobacco Use  Smoking Status Former   Current packs/day: 0.00   Average packs/day: 2.0 packs/day for 30.0 years (60.0 ttl pk-yrs)   Types: Cigarettes   Start date: 05/17/1970   Quit date: 05/16/2000   Years since quitting: 23.3  Smokeless Tobacco Never  Tobacco Comments   quit 06/2005    Goals Met:  Exercise tolerated well No report of concerns or symptoms today Strength training completed today  Goals Unmet:  Not Applicable  Comments: Service time is from 1010 to 1146.    Dr. Slater Staff is Medical Director for Pulmonary Rehab at [facility].

## 2023-10-05 ENCOUNTER — Encounter (HOSPITAL_COMMUNITY)
Admission: RE | Admit: 2023-10-05 | Discharge: 2023-10-05 | Disposition: A | Discharge status: Home / Self Care | Source: Ambulatory Visit | Attending: Internal Medicine

## 2023-10-05 DIAGNOSIS — J449 Chronic obstructive pulmonary disease, unspecified: Secondary | ICD-10-CM | POA: Diagnosis not present

## 2023-10-05 NOTE — Progress Notes (Signed)
 Daily Session Note  Patient Details  Name: Joseph Soto MRN: [id number] Date of Birth: 1945-10-02 Referring Provider:   [name] Pulmonary Rehab Walk Test from 09/13/2023 in [facility] for Heart, Vascular, & Lung Health  Referring Provider [name]    Encounter Date: 10/05/2023  Check In:  Session Check In - 10/05/23 1119       Check-In   Supervising physician immediately available to respond to emergencies [facility] immediately available    Physician(s) Rosaline Bane, NP    [facility]    Staff Present Augustin Sharps, Neita Moats, MS, ACSM-CEP, Exercise Physiologist;Mary Harvy, RN, BSN;Randi Reeve BS, ACSM-CEP, Exercise Physiologist    Virtual Visit No    Medication changes reported     No    Fall or balance concerns reported    No    Tobacco Cessation No Change    Warm-up and Cool-down Performed as group-led instruction    Resistance Training Performed Yes    VAD Patient? No    PAD/SET Patient? No      Pain Assessment   Currently in Pain? No/denies          Capillary Blood Glucose: No results found for this or any previous visit (from the past 24 hours).    Social History   Tobacco Use  Smoking Status Former   Current packs/day: 0.00   Average packs/day: 2.0 packs/day for 30.0 years (60.0 ttl pk-yrs)   Types: Cigarettes   Start date: 05/17/1970   Quit date: 05/16/2000   Years since quitting: 23.4  Smokeless Tobacco Never  Tobacco Comments   quit 06/2005    Goals Met:  Proper associated with RPD/PD & O2 Sat Exercise tolerated well No report of concerns or symptoms today Strength training completed today  Goals Unmet:  Not Applicable  Comments: Service time is from 1006 to 1140.    Dr. Slater Staff is Medical Director for Pulmonary Rehab at [facility].

## 2023-10-06 DIAGNOSIS — J449 Chronic obstructive pulmonary disease, unspecified: Secondary | ICD-10-CM | POA: Diagnosis not present

## 2023-10-07 ENCOUNTER — Encounter (HOSPITAL_COMMUNITY)
Admission: RE | Admit: 2023-10-07 | Discharge: 2023-10-07 | Disposition: A | Discharge status: Home / Self Care | Source: Ambulatory Visit | Attending: Internal Medicine | Admitting: Internal Medicine

## 2023-10-07 VITALS — Wt 202.4 lb

## 2023-10-07 DIAGNOSIS — J449 Chronic obstructive pulmonary disease, unspecified: Secondary | ICD-10-CM

## 2023-10-07 NOTE — Progress Notes (Signed)
 Daily Session Note  Patient Details  Name: Joseph Soto MRN: [id number] Date of Birth: 06-23-1945 Referring Provider:   [name] Pulmonary Rehab Walk Test from 09/13/2023 in [facility] for Heart, Vascular, & Lung Health  Referring Provider [name]    Encounter Date: 10/07/2023  Check In:  Session Check In - 10/07/23 1054       Check-In   Supervising physician immediately available to respond to emergencies [facility] MD immediately available    Physician(s) Jackee Wyn Raddle, NP    [facility]    Staff Present Augustin Sharps, Neita Moats, MS, ACSM-CEP, Exercise Physiologist;Mary Harvy, RN, BSN;Randi Reeve BS, ACSM-CEP, Exercise Physiologist    Virtual Visit No    Medication changes reported     No    Fall or balance concerns reported    No    Tobacco Cessation No Change    Warm-up and Cool-down Performed as group-led instruction    Resistance Training Performed Yes    VAD Patient? No    PAD/SET Patient? No      Pain Assessment   Currently in Pain? No/denies    Multiple Pain Sites No          Capillary Blood Glucose: No results found for this or any previous visit (from the past 24 hours).    Social History   Tobacco Use  Smoking Status Former   Current packs/day: 0.00   Average packs/day: 2.0 packs/day for 30.0 years (60.0 ttl pk-yrs)   Types: Cigarettes   Start date: 05/17/1970   Quit date: 05/16/2000   Years since quitting: 23.4  Smokeless Tobacco Never  Tobacco Comments   quit 06/2005    Goals Met:  Proper associated with RPD/PD & O2 Sat Independence with exercise equipment Exercise tolerated well No report of concerns or symptoms today Strength training completed today  Goals Unmet:  Not Applicable  Comments: Service time is from 1004 to 1137.    Dr. Slater Staff is Medical Director for Pulmonary Rehab at [facility].

## 2023-10-12 ENCOUNTER — Telehealth (HOSPITAL_COMMUNITY): Payer: Self-pay | Admitting: *Deleted

## 2023-10-12 ENCOUNTER — Encounter (HOSPITAL_COMMUNITY): Admission: RE | Admit: 2023-10-12 | Source: Ambulatory Visit

## 2023-10-12 NOTE — Telephone Encounter (Signed)
 Pt LVM that he cannot attend PR today. Will cancel appt.  Aliene Aris BS, ACSM-CEP 10/12/2023 9:26 AM

## 2023-10-13 NOTE — Progress Notes (Signed)
 Pulmonary Individual Treatment Plan  Patient Details  Name: Joseph Soto MRN: [id number] Date of Birth: Oct 01, 1945 Referring Provider:   [name] Pulmonary Rehab Walk Test from 09/13/2023 in [facility]  Referring Provider [name]    Initial Encounter Date:  Flowsheet Row Pulmonary Rehab Walk Test from 09/13/2023 in [facility]  Date 09/13/23    Visit Diagnosis: Stage 2 moderate COPD by GOLD classification (HCC)  Patient's Home Medications on Admission:   Current Outpatient Medications:    acetaminophen  (TYLENOL ) 500 MG tablet, Take 500-1,000 mg by mouth every 6 (six) hours as needed (pain.)., Disp: , Rfl:    albuterol  (PROVENTIL ) (2.5 MG/3ML) 0.083% nebulizer solution, Take 3 mLs (2.5 mg total) by nebulization every 6 (six) hours as needed for wheezing or shortness of breath., Disp: 75 mL, Rfl: 12   albuterol  (VENTOLIN  HFA) 108 (90 Base) MCG/ACT inhaler, Inhale into the lungs every 6 (six) hours as needed for wheezing or shortness of breath., Disp: , Rfl:    amLODipine  (NORVASC ) 5 MG tablet, Take 5 mg by mouth at bedtime., Disp: , Rfl:    aspirin  EC 81 MG tablet, Take 81 mg by mouth at bedtime., Disp: , Rfl:    azithromycin (ZITHROMAX) 250 MG tablet, Take by mouth. (Patient not taking: Reported on 09/13/2023), Disp: , Rfl:    cholecalciferol  (VITAMIN D ) 1000 units tablet, Take 2,000 Units by mouth daily with breakfast., Disp: , Rfl:    clopidogrel  (PLAVIX ) 75 MG tablet, Take 1 tablet (75 mg total) by mouth daily., Disp: 90 tablet, Rfl: 3   Coenzyme Q10 300 MG CAPS, Take 300 mg by mouth daily., Disp: , Rfl:    cyclobenzaprine  (FLEXERIL ) 10 MG tablet, Take 10 mg by mouth 2 (two) times daily., Disp: , Rfl:    doxycycline (VIBRA-TABS) 100 MG tablet, 1 tablet Orally twice a day for 10 days, Disp: , Rfl:    ezetimibe  (ZETIA ) 10 MG tablet, TAKE 1 TABLET BY MOUTH EVERY DAY,  Disp: 90 tablet, Rfl: 1   fluticasone  (FLONASE ) 50 MCG/ACT nasal spray, Place 1 spray into both nostrils daily as needed for rhinitis or allergies., Disp: , Rfl:    isosorbide  mononitrate (IMDUR ) 30 MG 24 hr tablet, Take 1 tablet (30 mg total) by mouth daily. Please schedule yearly appointment for future refills. 1st attempt. Thank you, Disp: 45 tablet, Rfl: 0   meloxicam  (MOBIC ) 15 MG tablet, Take 15 mg by mouth daily., Disp: , Rfl:    metoprolol  succinate (TOPROL -XL) 25 MG 24 hr tablet, Take 1 tablet (25 mg total) by mouth in the morning and at bedtime., Disp: 180 tablet, Rfl: 3   naltrexone  (DEPADE) 50 MG tablet, Take 50 mg by mouth 2 (two) times daily., Disp: , Rfl:    nitroGLYCERIN  (NITROSTAT ) 0.4 MG SL tablet, Place 1 tablet (0.4 mg total) under the tongue every 5 (five) minutes as needed for chest pain., Disp: 25 tablet, Rfl: 3   OHTUVAYRE  3 MG/2.5ML SUSP, Inhale 3 mg into the lungs 2 (two) times daily., Disp: , Rfl:    predniSONE  (DELTASONE ) 20 MG tablet, Take 20 mg by mouth daily., Disp: , Rfl:    rosuvastatin  (CRESTOR ) 40 MG tablet, Take 1 tablet (40 mg total) by mouth daily., Disp: 90 tablet, Rfl: 0   TRELEGY ELLIPTA 200-62.5-25 MCG/ACT AEPB, Inhale 1 puff into the lungs daily., Disp: , Rfl:   Past Medical History: Past  Medical History:  Diagnosis Date   Allergic rhinitis    grass, dust   Buttock pain    CAD (coronary artery disease)    Last cath 2010 per Dr. 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 Arnita office)   Myeloma [facility])    found it in my chest when they did the excision   Myocardial infarction (HCC) 06/2005   Non Hodgkin's lymphoma (HCC) dx'd 06/2005    Tobacco Use: Social History   Tobacco Use  Smoking Status Former   Current packs/day: 0.00   Average packs/day: 2.0 packs/day for 30.0 years (60.0 ttl pk-yrs)   Types: Cigarettes   Start date: 05/17/1970   Quit date: 05/16/2000   Years since quitting: 23.4  Smokeless Tobacco Never  Tobacco Comments   quit 06/2005    Labs: Review Flowsheet  More data exists      Latest Ref Rng & Units 09/14/2011 11/30/2011 05/02/2012 12/23/2015 01/24/2018  Labs for ITP Cardiac and Pulmonary Rehab  Cholestrol 100 - 199 mg/dL 882  828  818  - 883   LDL (calc) 0 - 99 mg/dL 53  890  875  - 52   HDL-C >39 mg/dL 47.29  47.19  54.29  - 46   Trlycerides 0 - 149 mg/dL 41.9  53.9  40.9  - 90   TCO2 0 - 100 mmol/L - - - 22  -    Capillary Blood Glucose: Lab Results  Component Value Date   GLUCAP 97 01/24/2013   GLUCAP 86 01/24/2013   GLUCAP 89 01/24/2013     Pulmonary Assessment Scores:  Pulmonary Assessment Scores     Row Name 09/13/23 1127         ADL UCSD   ADL Phase Entry     SOB Score total 41       CAT Score   CAT Score 21       mMRC Score   mMRC Score 2       UCSD: Self-administered rating of dyspnea associated with activities of daily living (ADLs) 6-point scale (0 = not at all to 5 = maximal or unable to do because of breathlessness)  Scoring Scores range from 0 to 120.  Minimally important difference is 5 units  CAT: CAT can identify the health impairment of COPD patients and is better correlated with disease progression.  CAT has a scoring range of zero to 40. The CAT score is classified into four groups of low (less than 10), medium (10 - 20), high (21-30) and very high (31-40) based on the impact  level of disease on health status. A CAT score over 10 suggests significant symptoms.  A worsening CAT score could be explained by an exacerbation, poor medication adherence, poor inhaler technique, or progression of COPD or comorbid conditions.  CAT MCID is 2 points  mMRC: mMRC (Modified Medical Research Council) Dyspnea Scale is used to assess the degree of baseline functional disability in patients of respiratory disease due to dyspnea. No minimal important difference is established. A decrease in score of 1 point or greater is considered a positive change.   Pulmonary Function Assessment:  Pulmonary Function Assessment - 09/13/23 1102       Breath   Bilateral Breath Sounds Clear    Shortness of Breath Yes;Fear of Shortness of Breath;Limiting activity          Exercise Target Goals: Exercise Program Goal: Individual exercise prescription set using results from initial 6 min walk test and THRR while considering  patient's activity barriers and safety.   Exercise Prescription Goal: Initial exercise prescription builds to 30-45 minutes a day of aerobic activity, 2-3 days per week.  Home exercise guidelines will be given to patient during program as part of exercise prescription that the participant will acknowledge.  Activity Barriers & Risk Stratification:  Activity Barriers & Cardiac Risk Stratification - 09/13/23 1100       Activity Barriers & Cardiac Risk Stratification   Activity Barriers Deconditioning;Muscular Weakness;Shortness of Breath;Fibromyalgia;Balance Concerns    Cardiac Risk Stratification Moderate          6 Minute Walk:  6 Minute Walk     Row Name 09/13/23 1147         6 Minute Walk   Phase Initial     Distance 1021 feet     Walk Time 6 minutes     # of Rest Breaks 0     MPH 1.93     METS 1.81     RPE 12     Perceived Dyspnea  1     VO2 Peak 6.34     Symptoms No     Resting HR 85 bpm     Resting BP 126/60     Resting Oxygen  Saturation  95 %      Exercise Oxygen  Saturation  during 6 min walk 91 %     Max Ex. HR 89 bpm     Max Ex. BP 126/60     2 Minute Post BP 112/60       Interval HR   1 Minute HR 79     2 Minute HR 83     3 Minute HR 89     4 Minute HR 87     5 Minute HR 79     6 Minute HR 82     2 Minute Post HR 77     Interval Heart Rate? Yes       Interval Oxygen    Interval Oxygen ? Yes     Baseline Oxygen  Saturation % 95 %     1 Minute Oxygen  Saturation % 91 %     1 Minute Liters of Oxygen  0 L     2 Minute Oxygen  Saturation % 95 %     2 Minute Liters of Oxygen  0 L     3 Minute Oxygen  Saturation % 91 %     3 Minute Liters of Oxygen  0 L     4 Minute Oxygen  Saturation % 96 %     4 Minute Liters of Oxygen  0 L     5 Minute Oxygen  Saturation % 93 %     5 Minute Liters of Oxygen  0 L     6 Minute Oxygen  Saturation % 91 %     6  Minute Liters of Oxygen  0 L     2 Minute Post Oxygen  Saturation % 98 %     2 Minute Post Liters of Oxygen  0 L        Oxygen  Initial Assessment:  Oxygen  Initial Assessment - 09/13/23 1102       Home Oxygen    Home Oxygen  Device Home Concentrator    Sleep Oxygen  Prescription Continuous    Liters per minute 2.5    Home Exercise Oxygen  Prescription None    Home Resting Oxygen  Prescription None    Compliance with Home Oxygen  Use Yes      Initial 6 min Walk   Oxygen  Used None      Program Oxygen  Prescription   Program Oxygen  Prescription None      Intervention   Short Term Goals To learn and exhibit compliance with exercise, home and travel O2 prescription;To learn and understand importance of monitoring SPO2 with pulse oximeter and demonstrate accurate use of the pulse oximeter.;To learn and understand importance of maintaining oxygen  saturations>88%;To learn and demonstrate proper pursed lip breathing techniques or other breathing techniques. ;To learn and demonstrate proper use of respiratory medications    Long  Term Goals Exhibits compliance with exercise, home  and travel O2  prescription;Maintenance of O2 saturations>88%;Compliance with respiratory medication;Verbalizes importance of monitoring SPO2 with pulse oximeter and return demonstration;Exhibits proper breathing techniques, such as pursed lip breathing or other method taught during program session;Demonstrates proper use of MDI's          Oxygen  Re-Evaluation:  Oxygen  Re-Evaluation     Row Name 09/13/23 1102 10/06/23 0901           Program Oxygen  Prescription   Program Oxygen  Prescription -- None        Home Oxygen    Home Oxygen  Device -- Home Concentrator      Sleep Oxygen  Prescription -- Continuous      Liters per minute -- 2.5      Home Exercise Oxygen  Prescription -- None      Home Resting Oxygen  Prescription -- None      Compliance with Home Oxygen  Use -- Yes        Goals/Expected Outcomes   Short Term Goals -- To learn and exhibit compliance with exercise, home and travel O2 prescription;To learn and understand importance of monitoring SPO2 with pulse oximeter and demonstrate accurate use of the pulse oximeter.;To learn and understand importance of maintaining oxygen  saturations>88%;To learn and demonstrate proper pursed lip breathing techniques or other breathing techniques. ;To learn and demonstrate proper use of respiratory medications      Long  Term Goals -- Exhibits compliance with exercise, home  and travel O2 prescription;Maintenance of O2 saturations>88%;Compliance with respiratory medication;Verbalizes importance of monitoring SPO2 with pulse oximeter and return demonstration;Exhibits proper breathing techniques, such as pursed lip breathing or other method taught during program session;Demonstrates proper use of MDI's      Goals/Expected Outcomes Compliance and understanding of oxygen  saturation monitoring and breathing techniques to decrease shortness of breath. Compliance and understanding of oxygen  saturation monitoring and breathing techniques to decrease shortness of breath.          Oxygen  Discharge (Final Oxygen  Re-Evaluation):  Oxygen  Re-Evaluation - 10/06/23 0901       Program Oxygen  Prescription   Program Oxygen  Prescription None      Home Oxygen    Home Oxygen  Device Home Concentrator    Sleep Oxygen  Prescription Continuous    Liters per minute 2.5    Home  Exercise Oxygen  Prescription None    Home Resting Oxygen  Prescription None    Compliance with Home Oxygen  Use Yes      Goals/Expected Outcomes   Short Term Goals To learn and exhibit compliance with exercise, home and travel O2 prescription;To learn and understand importance of monitoring SPO2 with pulse oximeter and demonstrate accurate use of the pulse oximeter.;To learn and understand importance of maintaining oxygen  saturations>88%;To learn and demonstrate proper pursed lip breathing techniques or other breathing techniques. ;To learn and demonstrate proper use of respiratory medications    Long  Term Goals Exhibits compliance with exercise, home  and travel O2 prescription;Maintenance of O2 saturations>88%;Compliance with respiratory medication;Verbalizes importance of monitoring SPO2 with pulse oximeter and return demonstration;Exhibits proper breathing techniques, such as pursed lip breathing or other method taught during program session;Demonstrates proper use of MDI's    Goals/Expected Outcomes Compliance and understanding of oxygen  saturation monitoring and breathing techniques to decrease shortness of breath.          Initial Exercise Prescription:  Initial Exercise Prescription - 09/13/23 1100       Date of Initial Exercise RX and Referring Provider   Date 09/13/23    Referring Provider [name]    Expected Discharge Date 12/09/23      Treadmill   MPH 1.4    Grade 0    Minutes 15    METs 2.07      Bike   Level 1    Watts 50    Minutes 15    METs 2      Prescription Details   Frequency (times per week) 2    Duration Progress to 30 minutes of continuous aerobic without  signs/symptoms of physical distress      Intensity   THRR 40-80% of Max Heartrate 56-113    Ratings of Perceived Exertion 11-13    Perceived Dyspnea 0-4      Progression   Progression Continue to progress workloads to maintain intensity without signs/symptoms of physical distress.      Resistance Training   Training Prescription Yes    Weight blue bands    Reps 10-15          Perform Capillary Blood Glucose checks as needed.  Exercise Prescription Changes:   Exercise Prescription Changes     Row Name 10/07/23 1208             Response to Exercise   Blood Pressure (Admit) 132/64       Blood Pressure (Exit) 112/68       Heart Rate (Admit) 80 bpm       Heart Rate (Exercise) 99 bpm       Heart Rate (Exit) 85 bpm       Oxygen  Saturation (Admit) 100 %       Oxygen  Saturation (Exercise) 92 %       Oxygen  Saturation (Exit) 97 %       Rating of Perceived Exertion (Exercise) 10       Perceived Dyspnea (Exercise) 1       Duration Continue with 30 min of aerobic exercise without signs/symptoms of physical distress.       Intensity THRR unchanged         Resistance Training   Training Prescription Yes       Weight blue bands       Reps 10-15       Time 10 Minutes         Treadmill   MPH  2.4       Grade 0       Minutes 15       METs 2.7         Bike   Level 2       Minutes 15       METs 2.7          Exercise Comments:   Exercise Comments     Row Name 09/21/23 0857           Exercise Comments Pt completed first day of exericse. He exercised for 15 min on the treadmill and upright bike. He performed the warmup and cooldown standing without limitations. Discussed METs.          Exercise Goals and Review:   Exercise Goals     Row Name 09/13/23 1039             Exercise Goals   Increase Physical Activity Yes       Intervention Provide advice, education, support and counseling about physical activity/exercise needs.;Develop an individualized  exercise prescription for aerobic and resistive training based on initial evaluation findings, risk stratification, comorbidities and participant's personal goals.       Expected Outcomes Short Term: Attend rehab on a regular basis to increase amount of physical activity.;Long Term: Add in home exercise to make exercise part of routine and to increase amount of physical activity.;Long Term: Exercising regularly at least 3-5 days a week.       Increase Strength and Stamina Yes       Intervention Provide advice, education, support and counseling about physical activity/exercise needs.;Develop an individualized exercise prescription for aerobic and resistive training based on initial evaluation findings, risk stratification, comorbidities and participant's personal goals.       Expected Outcomes Short Term: Increase workloads from initial exercise prescription for resistance, speed, and METs.;Short Term: Perform resistance training exercises routinely during rehab and add in resistance training at home;Long Term: Improve cardiorespiratory fitness, muscular endurance and strength as measured by increased METs and functional capacity ( )       Able to understand and use rate of perceived exertion (RPE) scale Yes       Intervention Provide education and explanation on how to use RPE scale       Expected Outcomes Short Term: Able to use RPE daily in rehab to express subjective intensity level;Long Term:  Able to use RPE to guide intensity level when exercising independently       Able to understand and use Dyspnea scale Yes       Intervention Provide education and explanation on how to use Dyspnea scale       Expected Outcomes Short Term: Able to use Dyspnea scale daily in rehab to express subjective sense of shortness of breath during exertion;Long Term: Able to use Dyspnea scale to guide intensity level when exercising independently       Knowledge and understanding of Target Heart Rate Range (THRR) Yes        Intervention Provide education and explanation of THRR including how the numbers were predicted and where they are located for reference       Expected Outcomes Short Term: Able to state/look up THRR;Long Term: Able to use THRR to govern intensity when exercising independently;Short Term: Able to use daily as guideline for intensity in rehab       Understanding of Exercise Prescription Yes       Intervention Provide education, explanation, and written materials on patient's  individual exercise prescription       Expected Outcomes Short Term: Able to explain program exercise prescription;Long Term: Able to explain home exercise prescription to exercise independently          Exercise Goals Re-Evaluation :  Exercise Goals Re-Evaluation     Row Name 09/13/23 1202 10/06/23 0859           Exercise Goal Re-Evaluation   Exercise Goals Review Increase Physical Activity;Able to understand and use Dyspnea scale;Understanding of Exercise Prescription;Increase Strength and Stamina;Knowledge and understanding of Target Heart Rate Range (THRR);Able to understand and use rate of perceived exertion (RPE) scale Increase Physical Activity;Able to understand and use Dyspnea scale;Understanding of Exercise Prescription;Increase Strength and Stamina;Knowledge and understanding of Target Heart Rate Range (THRR);Able to understand and use rate of perceived exertion (RPE) scale      Comments Pt is scheduled to begin exercise on 8/12. Will continue to monitor and progress as able. Joseph Soto has completed 5 exercise sessions. He exercises for 15 min on the treadmill and upright bike. He averages 2.5 METs at 2.2 mph on the treadmill and 2.7 METs at level 1-2 on the upright bike. Pt performs the warmup and cooldown standing without limitations. He has increased his speed on the treadmill and level on the bike as he tolerates progressions well. Will continue to monitor and progress as able.      Expected Outcomes Through  exercise at rehab and home, the patient will decrease shortness of breath with daily activities and feel confident in carrying out an exercise regimen at home. Through exercise at rehab and home, the patient will decrease shortness of breath with daily activities and feel confident in carrying out an exercise regimen at home.         Discharge Exercise Prescription (Final Exercise Prescription Changes):  Exercise Prescription Changes - 10/07/23 1208       Response to Exercise   Blood Pressure (Admit) 132/64    Blood Pressure (Exit) 112/68    Heart Rate (Admit) 80 bpm    Heart Rate (Exercise) 99 bpm    Heart Rate (Exit) 85 bpm    Oxygen  Saturation (Admit) 100 %    Oxygen  Saturation (Exercise) 92 %    Oxygen  Saturation (Exit) 97 %    Rating of Perceived Exertion (Exercise) 10    Perceived Dyspnea (Exercise) 1    Duration Continue with 30 min of aerobic exercise without signs/symptoms of physical distress.    Intensity THRR unchanged      Resistance Training   Training Prescription Yes    Weight blue bands    Reps 10-15    Time 10 Minutes      Treadmill   MPH 2.4    Grade 0    Minutes 15    METs 2.7      Bike   Level 2    Minutes 15    METs 2.7          Nutrition:  Target Goals: Understanding of nutrition guidelines, daily intake of sodium 1500mg , cholesterol 200mg , calories 30% from fat and 7% or less from saturated fats, daily to have 5 or more servings of fruits and vegetables.  Biometrics:  Pre Biometrics - 09/13/23 1151       Pre Biometrics   Grip Strength 25 kg           Nutrition Therapy Plan and Nutrition Goals:  Nutrition Therapy & Goals - 09/21/23 1223       Nutrition Therapy  Diet Heart Healthy Diet    Drug/Food Interactions Statins/Certain Fruits      Personal Nutrition Goals   Nutrition Goal Patient to improve diet quality by using the plate method as a guide for meal planning to include lean protein/plant protein, fruits, vegetables,  whole grains, nonfat dairy as part of a well-balanced diet.    Comments Joseph Soto has medical history of CAD s/p CABG, hyperlipidemia, ischemic cardiomyopathy, unstable angina, COPD2. LDL is well controlled on zetia , statin. He does follow a regular diet. Patient will benefit from participation in pulmonary rehab for nutrition education, exercise, and lifestyle modification.      Intervention Plan   Intervention Prescribe, educate and counsel regarding individualized specific dietary modifications aiming towards targeted core components such as weight, hypertension, lipid management, diabetes, heart failure and other comorbidities.;Nutrition handout(s) given to patient.    Expected Outcomes Short Term Goal: Understand basic principles of dietary content, such as calories, fat, sodium, cholesterol and nutrients.;Long Term Goal: Adherence to prescribed nutrition plan.          Nutrition Assessments:  MEDIFICTS Score Key: >=70 Need to make dietary changes  40-70 Heart Healthy Diet <= 40 Therapeutic Level Cholesterol Diet   Picture Your Plate Scores: <59 Unhealthy dietary pattern with much room for improvement. 41-50 Dietary pattern unlikely to meet recommendations for good health and room for improvement. 51-60 More healthful dietary pattern, with some room for improvement.  >60 Healthy dietary pattern, although there may be some specific behaviors that could be improved.    Nutrition Goals Re-Evaluation:  Nutrition Goals Re-Evaluation     Row Name 09/21/23 1223             Goals   Current Weight 199 lb 8.3 oz (90.5 kg)       Comment LDL 62, Cr 1.32, GFR 55       Expected Outcome Joseph Soto has medical history of CAD s/p CABG, hyperlipidemia, ischemic cardiomyopathy, unstable angina, COPD2. LDL is well controlled on zetia , statin. He does follow a regular diet. Patient will benefit from participation in pulmonary rehab for nutrition education, exercise, and lifestyle modification.           Nutrition Goals Discharge (Final Nutrition Goals Re-Evaluation):  Nutrition Goals Re-Evaluation - 09/21/23 1223       Goals   Current Weight 199 lb 8.3 oz (90.5 kg)    Comment LDL 62, Cr 1.32, GFR 55    Expected Outcome Joseph Soto has medical history of CAD s/p CABG, hyperlipidemia, ischemic cardiomyopathy, unstable angina, COPD2. LDL is well controlled on zetia , statin. He does follow a regular diet. Patient will benefit from participation in pulmonary rehab for nutrition education, exercise, and lifestyle modification.          Psychosocial: Target Goals: Acknowledge presence or absence of significant depression and/or stress, maximize coping skills, provide positive support system. Participant is able to verbalize types and ability to use techniques and skills needed for reducing stress and depression.  Initial Review & Psychosocial Screening:  Initial Psych Review & Screening - 09/13/23 1058       Initial Review   Current issues with None Identified      Family Dynamics   Good Support System? Yes      Barriers   Psychosocial barriers to participate in program There are no identifiable barriers or psychosocial needs.      Screening Interventions   Interventions Encouraged to exercise    Expected Outcomes Short Term goal: Identification and review with participant of  any Quality of Life or Depression concerns found by scoring the questionnaire.;Long Term goal: The participant improves quality of Life and PHQ9 Scores as seen by post scores and/or verbalization of changes          Quality of Life Scores:  Scores of 19 and below usually indicate a poorer quality of life in these areas.  A difference of  2-3 points is a clinically meaningful difference.  A difference of 2-3 points in the total score of the Quality of Life Index has been associated with significant improvement in overall quality of life, self-image, physical symptoms, and general health in studies assessing change  in quality of life.  PHQ-9: Review Flowsheet       09/13/2023  Depression screen PHQ 2/9  Decreased Interest 0  Down, Depressed, Hopeless 0  PHQ - 2 Score 0  Altered sleeping 0  Tired, decreased energy 3  Change in appetite 0  Feeling bad or failure about yourself  0  Trouble concentrating 1  Moving slowly or fidgety/restless 0  Suicidal thoughts 0  PHQ-9 Score 4  Difficult doing work/chores Not difficult at all   Interpretation of Total Score  Total Score Depression Severity:  1-4 = Minimal depression, 5-9 = Mild depression, 10-14 = Moderate depression, 15-19 = Moderately severe depression, 20-27 = Severe depression   Psychosocial Evaluation and Intervention:  Psychosocial Evaluation - 09/13/23 1131       Psychosocial Evaluation & Interventions   Interventions Encouraged to exercise with the program and follow exercise prescription    Comments Joseph Soto's initial PHQ 2-9 score 0/4. Joseph Soto denies any psychosocial barriers or concerns. No needs at this time.    Expected Outcomes For Joseph Soto to participate in PR free of any psychosocial barriers or concerns.    Continue Psychosocial Services  No Follow up required          Psychosocial Re-Evaluation:  Psychosocial Re-Evaluation     Row Name 09/14/23 0829 10/05/23 0825           Psychosocial Re-Evaluation   Current issues with None Identified None Identified      Comments Monthly psychosocial re-evaulation is as follows: Joseph Soto denied any psy/soc barriers or concerns at orientation. He declined any additional resources or referrals. He is scheduled to start the program next week Joseph Soto continues to deny any psychosocial barriers or concerns at this time.      Expected Outcomes For Joseph Soto to attend Pulm Rehab without any psy/soc concerns or barriers For Joseph Soto to attend Pulm Rehab without any psy/soc concerns or barriers      Interventions Encouraged to attend Pulmonary Rehabilitation for the exercise Encouraged to attend Pulmonary Rehabilitation  for the exercise      Continue Psychosocial Services  No Follow up required No Follow up required         Psychosocial Discharge (Final Psychosocial Re-Evaluation):  Psychosocial Re-Evaluation - 10/05/23 0825       Psychosocial Re-Evaluation   Current issues with None Identified    Comments Joseph Soto continues to deny any psychosocial barriers or concerns at this time.    Expected Outcomes For Joseph Soto to attend Pulm Rehab without any psy/soc concerns or barriers    Interventions Encouraged to attend Pulmonary Rehabilitation for the exercise    Continue Psychosocial Services  No Follow up required          Education: Education Goals: Education classes will be provided on a weekly basis, covering required topics. Participant will state understanding/return demonstration of topics  presented.  Learning Barriers/Preferences:  Learning Barriers/Preferences - 09/13/23 1058       Learning Barriers/Preferences   Learning Barriers Hearing   wears hearing aids   Learning Preferences None          Education Topics: Know Your Numbers Group instruction that is supported by a PowerPoint presentation. Instructor discusses importance of knowing and understanding resting, exercise, and post-exercise oxygen  saturation, heart rate, and blood pressure. Oxygen  saturation, heart rate, blood pressure, rating of perceived exertion, and dyspnea are reviewed along with a normal range for these values.    Exercise for the Pulmonary Patient Group instruction that is supported by a PowerPoint presentation. Instructor discusses benefits of exercise, core components of exercise, frequency, duration, and intensity of an exercise routine, importance of utilizing pulse oximetry during exercise, safety while exercising, and options of places to exercise outside of rehab.    MET Level  Group instruction provided by PowerPoint, verbal discussion, and written material to support subject matter. Instructor reviews what  METs are and how to increase METs.  Flowsheet Row PULMONARY REHAB CHRONIC OBSTRUCTIVE PULMONARY DISEASE from 09/23/2023 in [facility] for Heart, Vascular, & Lung Health  Date 09/23/23  Educator EP  Instruction Review Code 1- Verbalizes Understanding    Pulmonary Medications Verbally interactive group education provided by instructor with focus on inhaled medications and proper administration. Flowsheet Row PULMONARY REHAB CHRONIC OBSTRUCTIVE PULMONARY DISEASE from 09/30/2023 in [facility] for Heart, Vascular, & Lung Health  Date 09/30/23  Educator RT  Instruction Review Code 1- Verbalizes Understanding    Anatomy and Physiology of the Respiratory System Group instruction provided by PowerPoint, verbal discussion, and written material to support subject matter. Instructor reviews respiratory cycle and anatomical components of the respiratory system and their functions. Instructor also reviews differences in obstructive and restrictive respiratory diseases with examples of each.  Flowsheet Row PULMONARY REHAB CHRONIC OBSTRUCTIVE PULMONARY DISEASE from 10/07/2023 in [facility] for Heart, Vascular, & Lung Health  Date 10/07/23  Educator RT  Instruction Review Code 1- Verbalizes Understanding    Oxygen  Safety Group instruction provided by PowerPoint, verbal discussion, and written material to support subject matter. There is an overview of "What is Oxygen " and "Why do we need it".  Instructor also reviews how to create a safe environment for oxygen  use, the importance of using oxygen  as prescribed, and the risks of noncompliance. There is a brief discussion on traveling with oxygen  and resources the patient may utilize.   Oxygen  Use Group instruction provided by PowerPoint, verbal discussion, and written material to discuss how supplemental oxygen  is prescribed and different types of oxygen  supply systems. Resources for  more information are provided.    Breathing Techniques Group instruction that is supported by demonstration and informational handouts. Instructor discusses the benefits of pursed lip and diaphragmatic breathing and detailed demonstration on how to perform both.     Risk Factor Reduction Group instruction that is supported by a PowerPoint presentation. Instructor discusses the definition of a risk factor, different risk factors for pulmonary disease, and how the heart and lungs work together.   Pulmonary Diseases Group instruction provided by PowerPoint, verbal discussion, and written material to support subject matter. Instructor gives an overview of the different type of pulmonary diseases. There is also a discussion on risk factors and symptoms as well as ways to manage the diseases.   Stress and Energy Conservation Group instruction provided by PowerPoint, verbal discussion, and written  material to support subject matter. Instructor gives an overview of stress and the impact it can have on the body. Instructor also reviews ways to reduce stress. There is also a discussion on energy conservation and ways to conserve energy throughout the day.   Warning Signs and Symptoms Group instruction provided by PowerPoint, verbal discussion, and written material to support subject matter. Instructor reviews warning signs and symptoms of stroke, heart attack, cold and flu. Instructor also reviews ways to prevent the spread of infection.   Other Education Group or individual verbal, written, or video instructions that support the educational goals of the pulmonary rehab program.    Knowledge Questionnaire Score:  Knowledge Questionnaire Score - 09/13/23 1041       Knowledge Questionnaire Score   Pre Score 11/18          Core Components/Risk Factors/Patient Goals at Admission:  Personal Goals and Risk Factors at Admission - 09/13/23 1059       Core Components/Risk Factors/Patient Goals  on Admission    Weight Management Yes;Weight Loss    Intervention Weight Management: Develop a combined nutrition and exercise program designed to reach desired caloric intake, while maintaining appropriate intake of nutrient and fiber, sodium and fats, and appropriate energy expenditure required for the weight goal.;Weight Management: Provide education and appropriate resources to help participant work on and attain dietary goals.;Weight Management/Obesity: Establish reasonable short term and long term weight goals.;Obesity: Provide education and appropriate resources to help participant work on and attain dietary goals.    Admit Weight 201 lb 4.5 oz (91.3 kg)    Expected Outcomes Short Term: Continue to assess and modify interventions until short term weight is achieved;Long Term: Adherence to nutrition and physical activity/exercise program aimed toward attainment of established weight goal;Weight Loss: Understanding of general recommendations for a balanced deficit meal plan, which promotes 1-2 lb weight loss per week and includes a negative energy balance of [phone] kcal/d;Understanding recommendations for meals to include 15-35% energy as protein, 25-35% energy from fat, 35-60% energy from carbohydrates, less than 200mg  of dietary cholesterol, 20-35 gm of total fiber daily;Understanding of distribution of calorie intake throughout the day with the consumption of 4-5 meals/snacks    Improve shortness of breath with ADL's Yes    Intervention Provide education, individualized exercise plan and daily activity instruction to help decrease symptoms of SOB with activities of daily living.    Expected Outcomes Short Term: Improve cardiorespiratory fitness to achieve a reduction of symptoms when performing ADLs;Long Term: Be able to perform more ADLs without symptoms or delay the onset of symptoms          Core Components/Risk Factors/Patient Goals Review:   Goals and Risk Factor Review     Row Name  09/14/23 0834 10/05/23 0826           Core Components/Risk Factors/Patient Goals Review   Personal Goals Review Weight Management/Obesity;Improve shortness of breath with ADL's;Develop more efficient breathing techniques such as purse lipped breathing and diaphragmatic breathing and practicing self-pacing with activity. Weight Management/Obesity;Improve shortness of breath with ADL's;Develop more efficient breathing techniques such as purse lipped breathing and diaphragmatic breathing and practicing self-pacing with activity.      Review Monthly re-evaluation of Core Components/Risk Factors/Patient Goals are as follows: Unable to assess Joseph Soto's goals yet. He is scheduled to start to the program next week Monthly re-evaluation of Core Components/Risk Factors/Patient Goals are as follows: Goal progressing for weight loss. Joseph Soto has met with the dietitician and is applying her  recommendations to meet his weight loss goals. Goal progressing for improving shortness of breath with ADL's. Joseph Soto is currently exercising on RA to keep sats >88%. Goal progressing for developing more efficient breathing techniques such as purse lipped breathing and diaphragmatic breathing; and practicing self-pacing with activity. We will continue to monitor Joseph Soto's progress throughout the program.      Expected Outcomes Pt will show progress toward meeting expected goals and outcomes. To improve shortness of breath with ADL's, lose weight and develop more efficient breathing techniques such as purse lipped breathing and diaphragmatic breathing; and practicing self-pacing with activity.         Core Components/Risk Factors/Patient Goals at Discharge (Final Review):   Goals and Risk Factor Review - 10/05/23 0826       Core Components/Risk Factors/Patient Goals Review   Personal Goals Review Weight Management/Obesity;Improve shortness of breath with ADL's;Develop more efficient breathing techniques such as purse lipped breathing and  diaphragmatic breathing and practicing self-pacing with activity.    Review Monthly re-evaluation of Core Components/Risk Factors/Patient Goals are as follows: Goal progressing for weight loss. Joseph Soto has met with the dietitician and is applying her recommendations to meet his weight loss goals. Goal progressing for improving shortness of breath with ADL's. Joseph Soto is currently exercising on RA to keep sats >88%. Goal progressing for developing more efficient breathing techniques such as purse lipped breathing and diaphragmatic breathing; and practicing self-pacing with activity. We will continue to monitor Joseph Soto's progress throughout the program.    Expected Outcomes To improve shortness of breath with ADL's, lose weight and develop more efficient breathing techniques such as purse lipped breathing and diaphragmatic breathing; and practicing self-pacing with activity.          ITP Comments: Pt is making expected progress toward Pulmonary Rehab goals after completing 6 session(s). Recommend continued exercise, life style modification, education, and utilization of breathing techniques to increase stamina and strength, while also decreasing shortness of breath with exertion.  Dr. Slater Staff is Medical Director for Pulmonary Rehab at [facility].

## 2023-10-14 ENCOUNTER — Encounter (HOSPITAL_COMMUNITY)
Admission: RE | Admit: 2023-10-14 | Discharge: 2023-10-14 | Disposition: A | Discharge status: Home / Self Care | Source: Ambulatory Visit | Attending: Internal Medicine | Admitting: Internal Medicine

## 2023-10-14 DIAGNOSIS — J449 Chronic obstructive pulmonary disease, unspecified: Secondary | ICD-10-CM | POA: Diagnosis not present

## 2023-10-14 NOTE — Progress Notes (Signed)
 Daily Session Note  Patient Details  Name: Joseph Soto MRN: [id number] Date of Birth: 02/03/1946 Referring Provider:   [name] Pulmonary Rehab Walk Test from 09/13/2023 in [facility] for Heart, Vascular, & Lung Health  Referring Provider [name]    Encounter Date: 10/14/2023  Check In:  Session Check In - 10/14/23 1028       Check-In   Supervising physician immediately available to respond to emergencies [facility] MD immediately available    Physician(s) Barnie Press, NP    Location [facility]    Staff Present Augustin Sharps, Neita Moats, MS, ACSM-CEP, Exercise Physiologist;Mary Harvy, RN, BSN;Randi Reeve BS, ACSM-CEP, Exercise Physiologist    Virtual Visit No    Medication changes reported     No    Fall or balance concerns reported    No    Tobacco Cessation No Change    Warm-up and Cool-down Performed as group-led instruction    Resistance Training Performed Yes    VAD Patient? No    PAD/SET Patient? No      Pain Assessment   Currently in Pain? No/denies          Capillary Blood Glucose: No results found for this or any previous visit (from the past 24 hours).    Social History   Tobacco Use  Smoking Status Former   Current packs/day: 0.00   Average packs/day: 2.0 packs/day for 30.0 years (60.0 ttl pk-yrs)   Types: Cigarettes   Start date: 05/17/1970   Quit date: 05/16/2000   Years since quitting: 23.4  Smokeless Tobacco Never  Tobacco Comments   quit 06/2005    Goals Met:  Proper associated with RPD/PD & O2 Sat Independence with exercise equipment Exercise tolerated well No report of concerns or symptoms today Strength training completed today  Goals Unmet:  Not Applicable  Comments: Service time is from 1010 to 1141.    Dr. Slater Staff is Medical Director for Pulmonary Rehab at [facility].

## 2023-10-19 ENCOUNTER — Encounter: Payer: Self-pay | Admitting: *Deleted

## 2023-10-19 ENCOUNTER — Encounter: Payer: Self-pay | Admitting: Internal Medicine

## 2023-10-19 ENCOUNTER — Other Ambulatory Visit: Payer: Self-pay | Admitting: Internal Medicine

## 2023-10-19 ENCOUNTER — Encounter (HOSPITAL_COMMUNITY)
Admission: RE | Admit: 2023-10-19 | Discharge: 2023-10-19 | Disposition: A | Discharge status: Home / Self Care | Source: Ambulatory Visit | Attending: Internal Medicine | Admitting: Internal Medicine

## 2023-10-19 ENCOUNTER — Ambulatory Visit (INDEPENDENT_AMBULATORY_CARE_PROVIDER_SITE_OTHER): Admitting: Internal Medicine

## 2023-10-19 VITALS — BP 148/88 | HR 83 | Ht 71.0 in | Wt 203.0 lb

## 2023-10-19 DIAGNOSIS — G4734 Idiopathic sleep related nonobstructive alveolar hypoventilation: Secondary | ICD-10-CM

## 2023-10-19 DIAGNOSIS — J449 Chronic obstructive pulmonary disease, unspecified: Secondary | ICD-10-CM | POA: Diagnosis not present

## 2023-10-19 DIAGNOSIS — G4733 Obstructive sleep apnea (adult) (pediatric): Secondary | ICD-10-CM

## 2023-10-19 DIAGNOSIS — J309 Allergic rhinitis, unspecified: Secondary | ICD-10-CM | POA: Diagnosis not present

## 2023-10-19 MED ORDER — COVID-19 MRNA VAC-TRIS(PFIZER) 30 MCG/0.3ML IM SUSY: 0.3000 mL | PREFILLED_SYRINGE | Freq: Once | INTRAMUSCULAR | 0 refills | Status: DC

## 2023-10-19 MED ORDER — AEROCHAMBER MV MISC: 0 refills | Status: AC

## 2023-10-19 MED ORDER — BREZTRI AEROSPHERE 160-9-4.8 MCG/ACT IN AERO: 2.0000 | INHALATION_SPRAY | Freq: Two times a day (BID) | RESPIRATORY_TRACT | 12 refills | Status: AC

## 2023-10-19 MED ORDER — AZELASTINE HCL 0.1 % NA SOLN: 1.0000 | Freq: Two times a day (BID) | NASAL | 12 refills | Status: AC

## 2023-10-19 MED ORDER — COVID-19 MRNA VAC-TRIS(PFIZER) 30 MCG/0.3ML IM SUSY: 0.3000 mL | PREFILLED_SYRINGE | Freq: Once | INTRAMUSCULAR | 0 refills | Status: AC

## 2023-10-19 NOTE — Patient Instructions (Addendum)
 It was a pleasure to see you today!  Please schedule follow up with myself in 3 months.  If my schedule is not open yet, we will contact you with a reminder closer to that time. Please call [phone] if you haven't heard from us  a month before, and always call us  sooner if issues or concerns arise. You can also send us  a message through MyChart, but but aware that this is not to be used for urgent issues and it may take up to 5-7 days to receive a reply. Please be aware that you will likely be able to view your results before I have a chance to respond to them. Please give us  5 business days to respond to any non-urgent results.    Start using breztri  2 puffs twice a day, gargle after use. Use with spacer. This is maintenance therapy  Continue albuterol  inhaler or nebulizer treatments up to 4 times a day as needed for wheezing, shortness of breath  Continue wearing 2LNC oxygen  at night.   For chronic coughing related to nasal drainage continue flonase  1 spray each nare daily (am). Start taking astelin  nasal spray 1 spray each nare daily (pm)  Continue pulmonary rehab.   Flonase /Astelin  - 1 spray on each side of your nose twice a day for first week, then 1 spray on each side.   Instructions for use: If you also use a saline nasal spray or rinse, use that first. Position the head with the chin slightly tucked. Use the right hand to spray into the left nostril and the right hand to spray into the left nostril.   Point the bottle away from the septum of your nose (cartilage that divides the two sides of your nose).  Hold the nostril closed on the opposite side from where you will spray Spray once and gently sniff to pull the medicine into the higher parts of your nose.  Don't sniff too hard as the medicine will drain down the back of your throat instead. Repeat with a second spray on the same side if prescribed. Repeat on the other side of your nose.

## 2023-10-19 NOTE — Addendum Note (Signed)
 Addended by: Claudett Bayly on: 10/19/2023 03:00 PM   Modules accepted: Orders

## 2023-10-19 NOTE — Progress Notes (Signed)
 Daily Session Note  Patient Details  Name: Joseph Soto MRN: [id number] Date of Birth: 18-May-1945 Referring Provider:   [name] Pulmonary Rehab Walk Test from 09/13/2023 in [facility] for Heart, Vascular, & Lung Health  Referring Provider [name]    Encounter Date: 10/19/2023  Check In:  Session Check In - 10/19/23 1020       Check-In   Supervising physician immediately available to respond to emergencies [facility] MD immediately available    Physician(s) Josefa Beauvais, NP    Location [facility]    Staff Present Augustin Sharps, Neita Moats, MS, ACSM-CEP, Exercise Physiologist;Mary Harvy, RN, BSN;Randi Reeve BS, ACSM-CEP, Exercise Physiologist    Virtual Visit No    Medication changes reported     No    Fall or balance concerns reported    No    Tobacco Cessation No Change    Warm-up and Cool-down Performed as group-led instruction    Resistance Training Performed Yes    VAD Patient? No    PAD/SET Patient? No      Pain Assessment   Currently in Pain? No/denies    Multiple Pain Sites No          Capillary Blood Glucose: No results found for this or any previous visit (from the past 24 hours).    Social History   Tobacco Use  Smoking Status Former   Current packs/day: 0.00   Average packs/day: 2.0 packs/day for 30.0 years (60.0 ttl pk-yrs)   Types: Cigarettes   Start date: 05/17/1970   Quit date: 05/16/2000   Years since quitting: 23.4  Smokeless Tobacco Never  Tobacco Comments   quit 06/2005    Goals Met:  Proper associated with RPD/PD & O2 Sat Exercise tolerated well No report of concerns or symptoms today Strength training completed today  Goals Unmet:  Not Applicable  Comments: Service time is from 1009 to 1142.    Dr. Slater Staff is Medical Director for Pulmonary Rehab at [facility].

## 2023-10-19 NOTE — Progress Notes (Signed)
 Joseph Soto    [id number]    Feb 19, 1945  Primary Care Physician:Paterson, Toribio MATSU, MD Date of Appointment: 10/19/2023 Established Patient Visit  Chief complaint:   Chief Complaint  Patient presents with   Medical Management of Chronic Issues    COPD the same not good      HPI: Joseph Soto is a 78 y.o. man with COPD FEV1 68% on breztri  and CAD s/p CABG on nocturnal oxygen . Home sleep apnea test which showed AHI 8.1/hr with severe oxygen  desaturations likely attributed to chronic lung disease.   Interval Updates: Here for copd follow up.   Did not tolerate ohtuvayre  - had effects of feeling dizzy, fatigued. Took it for about 2 weeks.   Over the past two months worsening respiratory symptoms. More fatigue. Worsening dry cough. He is having frequent throat clearing and voice hoarseness. Taking flonase  daily. Taking anti-histamine over the counter which does seem to help.   No interval exacerbations or hospitalizations.  He is now in pulmonary rehab.   Using albuterol  as needed, at least 1-2 times/day. He is not taking trelegy anymore due to confusion on what to take. He thought he was supposed to switch to albuterol  only    I have reviewed the patient's family social and past medical history and updated as appropriate.   Past Medical History:  Diagnosis Date   Allergic rhinitis    grass, dust   Buttock pain    CAD (coronary artery disease)    Last cath 2010 per Dr. 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 Arnita office)   Myeloma [facility])    found it in my chest when they did the excision   Myocardial infarction (HCC) 06/2005   Non Hodgkin's lymphoma (HCC) dx'd 06/2005    Past Surgical History:  Procedure Laterality Date   BALLOON SINUPLASTY  2014   CARDIAC CATHETERIZATION  ~ 2008   Dr. Tisa   CATARACT EXTRACTION W/ INTRAOCULAR LENS IMPLANT Right    colonscopy  2010   CORONARY ANGIOPLASTY WITH STENT PLACEMENT  06/17/2016   CORONARY ARTERY BYPASS GRAFT  5/07   x5. LV dysfunction.    CORONARY BALLOON ANGIOPLASTY N/A 06/14/2019   Procedure: CORONARY BALLOON ANGIOPLASTY;  Surgeon: Verlin Lonni BIRCH, MD;  [facility];  Service: Cardiovascular;  Laterality: N/A;   CORONARY STENT INTERVENTION N/A 06/17/2016   Procedure: Coronary Stent Intervention;  Surgeon: Verlin Lonni BIRCH, MD;  Location: [facility];  Service: Cardiovascular;  Laterality: N/A;   epidural steroid injections     INGUINAL HERNIA REPAIR Bilateral 06/01/2022   Procedure: LAPAROSCOPIC BILATERAL INGUINAL HERNIA REPAIR WITH MESH;  Surgeon: Stechschulte, Deward PARAS, MD;  Location: [facility];  Service: General;  Laterality: Bilateral;   LAPAROSCOPIC CHOLECYSTECTOMY  1996   LEFT HEART CATH AND CORS/GRAFTS ANGIOGRAPHY N/A 06/17/2016   Procedure: Left Heart Cath and Cors/Grafts Angiography;  Surgeon: Verlin Lonni BIRCH, MD;  Location: [facility] CV [facility];  Service: Cardiovascular;  Laterality: N/A;   LEFT HEART CATH AND CORS/GRAFTS ANGIOGRAPHY N/A 06/14/2019   Procedure: LEFT HEART CATH AND CORS/GRAFTS ANGIOGRAPHY;  Surgeon: Verlin Lonni BIRCH, MD;  [facility];  Service: Cardiovascular;  Laterality: N/A;   LEFT HEART CATH AND CORS/GRAFTS ANGIOGRAPHY N/A 02/26/2022   Procedure: LEFT HEART CATH AND CORS/GRAFTS ANGIOGRAPHY;  Surgeon: Verlin Lonni BIRCH, MD;  [facility];  Service: Cardiovascular;  Laterality: N/A;   MASS EXCISION N/A 11/11/2015   Procedure: EXCISION OF MID CHEST  MASS;  Surgeon: Lynwood Pina, MD;  Location: [facility];  Service: General;  Laterality: N/A;   MASS EXCISION N/A 12/23/2015   Procedure: REEXCISION OF CHEST WALL TUMOR SITE;  Surgeon: Lynwood Pina, MD;  Location: [facility];  Service: General;  Laterality: N/A;   POPLITEAL SYNOVIAL CYST EXCISION Right    TONSILLECTOMY  1952   & Adenoidectomy   UMBILICAL HERNIA REPAIR N/A 06/01/2022   Procedure: OPEN PRIMARY UMBILICAL HERNIA REPAIR;  Surgeon: Lyndel Deward PARAS, MD;  Location: [facility];  Service: General;  Laterality: N/A;    Family History  Problem Relation Age of Onset   Heart disease Father    Heart attack Father    Congestive Heart Failure Father    COPD Father    Dementia Mother    Cancer Paternal Grandfather        ? type    Social History   Occupational History   Occupation: retired  Tobacco Use   Smoking status: Former    Current packs/day: 0.00    Average packs/day: 2.0 packs/day for 30.0 years (60.0 ttl pk-yrs)    Types: Cigarettes    Start date: 05/17/1970    Quit date: 05/16/2000    Years since quitting: 23.4   Smokeless tobacco: Never   Tobacco comments:    quit 06/2005  Vaping Use   Vaping status: Never Used  Substance and Sexual Activity   Alcohol use: Not Currently   Drug use: No   Sexual activity: Not on file     Physical Exam: Blood pressure (!) 148/88, pulse 83, height 5' 11 (1.803 m), weight 203 lb (92.1 kg), SpO2 93%.  Gen:      No distress Lungs:  diminished, no wheezes or crackles CV:       RRR no mrg   Data Reviewed: Imaging: I have personally reviewed the chest xray March 2023 - hyperinflation and interstitial changes consistent with COPD.   PFTs:     Latest Ref Rng & Units 06/30/2023    9:44 AM 08/27/2021    3:41 PM 01/26/2014    3:47 PM  PFT  Results  FVC-Pre L 3.79  3.98  6.05   FVC-Predicted Pre % 93  95  136   FVC-Post L 4.00  4.06  5.54   FVC-Predicted Post % 98  97  125   Pre FEV1/FVC % % 53  57  47   Post FEV1/FCV % % 57  56  49   FEV1-Pre L 2.00  2.25  2.85   FEV1-Predicted Pre % 68  75  87   FEV1-Post L 2.26  2.25  2.73   DLCO uncorrected ml/min/mmHg  11.56  21.64   DLCO UNC% %  46  66  DLCO corrected ml/min/mmHg  11.56  21.64   DLCO COR %Predicted %  46  66   DLVA Predicted %  43  61   TLC L  8.74  10.32   TLC % Predicted %  123  146   RV % Predicted %  158  171    I have personally reviewed the patient's PFTs and mild airflow limitation with hyperinflation and air trapping.   Labs: Lab Results  Component Value Date   WBC 12.6 (H) 07/21/2023   HGB 13.0 07/21/2023   HCT 41.3 07/21/2023   MCV 96 07/21/2023   PLT 294 07/21/2023   Lab Results  Component Value Date   NA 139 07/21/2023   K 5.0 07/21/2023   CO2 20 07/21/2023   GLUCOSE 97 07/21/2023   BUN 21 07/21/2023   CREATININE 1.23 07/21/2023   CALCIUM  9.0 07/21/2023   EGFR 60 07/21/2023   GFRNONAA 55 (L) 05/21/2022      Immunization status: Immunization History  Administered Date(s) Administered   INFLUENZA, HIGH DOSE SEASONAL PF 11/08/2016   Influenza Split 11/27/2019, 10/09/2022   Influenza, Quadrivalent, Recombinant, Inj, Pf 11/25/2017, 09/29/2018, 11/22/2019   Influenza-Unspecified 10/16/2010, 11/11/2015, 11/09/2017, 11/15/2020, 11/07/2021   Moderna Sars-Covid-2 Vaccination 06/09/2023   PFIZER(Purple Top)SARS-COV-2 Vaccination 03/19/2019, 04/13/2019, 10/11/2019, 04/30/2020   Pfizer Covid-19 Vaccine Bivalent Booster 50yrs & up 10/30/2020   Pneumococcal Conjugate-13 02/09/2014   Pneumococcal Polysaccharide-23 02/10/2012   Respiratory Syncytial Virus Vaccine,Recomb Aduvanted(Arexvy) 10/09/2022   Zoster Recombinant(Shingrix) 11/20/2020    External Records Personally Reviewed: PCP  Assessment:  COPD moderate FEV1 68%.  Allergic  rhinitis, not well controlled Mild OSA - AHI 8/hr with severe nocturnal desaturation CAD s/p CABG with stenosis not amenable to PCI - stable LVEF as of June 2023 Nocturnal hypoxemia  Plan/Recommendations:  Start using breztri  2 puffs twice a day, gargle after use. Use with spacer. This is maintenance therapy  Continue albuterol  inhaler or nebulizer treatments up to 4 times a day as needed for wheezing, shortness of breath  Continue wearing 2LNC oxygen  at night.   For chronic coughing related to nasal drainage continue flonase  1 spray each nare daily (am). Start taking astelin  nasal spray 1 spray each nare daily (pm)  Continue pulmonary rehab.   Return to Care: Return in about 3 months (around 01/18/2024).   Verdon Gore, MD Pulmonary and Critical Care Medicine [place] Office:[phone]

## 2023-10-21 ENCOUNTER — Ambulatory Visit: Attending: Physician Assistant | Admitting: Physician Assistant

## 2023-10-21 ENCOUNTER — Ambulatory Visit (HOSPITAL_COMMUNITY)
Admission: RE | Admit: 2023-10-21 | Discharge: 2023-10-21 | Disposition: A | Discharge status: Home / Self Care | Source: Ambulatory Visit | Attending: Physician Assistant | Admitting: Physician Assistant

## 2023-10-21 ENCOUNTER — Encounter (HOSPITAL_COMMUNITY)

## 2023-10-21 ENCOUNTER — Encounter: Payer: Self-pay | Admitting: Physician Assistant

## 2023-10-21 VITALS — BP 110/62 | HR 65 | Ht 71.0 in | Wt 202.0 lb

## 2023-10-21 DIAGNOSIS — E785 Hyperlipidemia, unspecified: Secondary | ICD-10-CM | POA: Diagnosis not present

## 2023-10-21 DIAGNOSIS — I25810 Atherosclerosis of coronary artery bypass graft(s) without angina pectoris: Secondary | ICD-10-CM

## 2023-10-21 DIAGNOSIS — I700 Atherosclerosis of aorta: Secondary | ICD-10-CM | POA: Diagnosis not present

## 2023-10-21 DIAGNOSIS — R0602 Shortness of breath: Secondary | ICD-10-CM | POA: Insufficient documentation

## 2023-10-21 DIAGNOSIS — J439 Emphysema, unspecified: Secondary | ICD-10-CM | POA: Diagnosis not present

## 2023-10-21 NOTE — Patient Instructions (Signed)
 Medication Instructions:  Your physician recommends that you continue on your current medications as directed. Please refer to the Current Medication list given to you today.  *If you need a refill on your cardiac medications before your next appointment, please call your pharmacy*  Lab Work: TODAY:  PRO BNP  . If you have labs (blood work) drawn today and your tests are completely normal, you will receive your results only by: MyChart Message (if you have MyChart) OR A paper copy in the mail If you have any lab test that is abnormal or we need to change your treatment, we will call you to review the results.  Testing/Procedures: You will need to have a CHEST X-RAY TODAY.   Follow-Up: At [facility], you and your health needs are our priority.  As part of our continuing mission to provide you with exceptional heart care, our providers are all part of one team.  This team includes your primary Cardiologist (physician) and Advanced Practice Providers or APPs (Physician Assistants and Nurse Practitioners) who all work together to provide you with the care you need, when you need it.  Your next appointment:   3 month(s)  Provider:   [name], MD    We recommend signing up for the patient portal called MyChart.  Sign up information is provided on this After Visit Summary.  MyChart is used to connect with patients for Virtual Visits (Telemedicine).  Patients are able to view lab/test results, encounter notes, upcoming appointments, etc.  Non-urgent messages can be sent to your provider as well.   To learn more about what you can do with MyChart, go to [email or web address].   Other Instructions

## 2023-10-21 NOTE — Progress Notes (Unsigned)
 Cardiology Office Note   Date:  10/21/2023  ID:  Joseph Soto, DOB 01/31/1946, MRN [id number] PCP: Yolande Toribio MATSU, MD  [facility] Providers Cardiologist:  [name], MD { Click to update primary MD,subspecialty MD or APP then REFRESH:1}    History of Present Illness Joseph Soto is a 78 y.o. male with past medical history of CAD s/p 4v CABG 2007, non-Hodgkin's lymphoma, ischemic cardiomyopathy and hyperlipidemia.  Cardiac catheterization in 2010 showed occluded SVG to diagonal and occluded SVG to PDA/PLA, patent LIMA to LAD.  Patient had diffuse disease in the distal vessel that was felt to be best managed medically.  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  Echocardiogram in April 2018 showed EF 40 to 45%, hypokinesis of the anteroseptal wall and apex.  Nuclear stress test in April 2018 showed possible ischemia.  Cardiac catheterization in May 2018 showed patent LIMA-LAD, all other graft known to be occluded.  Severe stenosis of the proximal RCA treated with drug-eluting stent.  Heart monitor in September 2019 showed sinus rhythm, rare PVCs and rare PACs.  Nuclear stress test in September 2019 showed no ischemia.  Echocardiogram in September 2019 showed EF 40%, trivial MR.  Cardiac catheterization in May 2021 showed patent LIMA to LAD, known occluded mid LAD, patent RCA stent.  He had moderate left main stenosis and severe proximal left circumflex lesion.  Attempt was made PCI of left circumflex which was difficult.  He underwent balloon angioplasty of ostial left circumflex artery with no stent placement due to difficulty expanding the lesion secondary to tortuosity and calcification.  The lesion was not felt to be favorable for further attempt at PCI given difficult anatomy.  Plan for medical management of CAD.  Echocardiogram in June 2023 showed EF 55 to 60%, no significant valve disease.  He had had dyspnea and was seen by pulmonary and felt to  have mild COPD and sleep apnea.  Repeat cardiac catheterization in January 2024 showed no change in anatomy, only patent graft there was LIMA-LAD.  Left main and left circumflex not amenable to PCI.  He was referred to Dr. Kerrin of CV surgery to discuss redo bypass.  He elected not to proceed with bypass surgery.    I last saw the patient in June 2025 for evaluation of fatigue.  He had chronic dyspnea on exertion followed by pulmonology service.  Symptom of fatigue lasted for 3 days accompanied by chills.  EKG showed a new right bundle branch block.  I ordered a heart monitor and echocardiogram.  CBC at the time does show his white blood cell count was elevated at 12.6.  Basic metabolic panel shows stable renal function and electrolyte.  TSH normal.  Echocardiogram obtained on 8//2025 showed EF 45 to 50%, grade 1 DD, normal pulmonary artery systolic pressure, trivial MR.  Subsequent heart monitor showed occasional fast burst but normal sinus rhythm associated with patient triggered events.  I recommended increase metoprolol  succinate to 1 tablet twice a day for better rate control.  Patient presents today for follow-up.  He continued to have shortness of breath with activity.  He has been seen by pulmonology service and had a breathing treatment adjusted.  On exam, he has diffusely diminished breath sound all over, however is more prominent in the right base of the lung along with crackles.  On exam he does not have any JVD or lower extremity edema.  My suspicion is this is more likely to be  pulmonary rather than cardiac.  He also complains of persistent dry cough.  He does not appear to be volume overloaded.  I will obtain a proBNP and also two-view chest x-ray to assess.  If there is any evidence of pulmonary edema, I may decide to give him some as needed dose of Lasix.  We reviewed the recent echocardiogram and compared recent echo with a previous echocardiogram from 2023.  He is adamant that he does  not have any recent chest discomfort, we decided to hold off on any stress testing.  He can follow-up with Dr. Verlin in 3 to 4 months  ROS: ***  Studies Reviewed      *** Risk Assessment/Calculations {Does this patient have ATRIAL FIBRILLATION?[phone]}         Physical Exam VS:  BP 110/62   Pulse 65   Ht 5' 11 (1.803 m)   Wt 202 lb (91.6 kg)   SpO2 98%   BMI 28.17 kg/m        Wt Readings from Last 3 Encounters:  10/21/23 202 lb (91.6 kg)  10/19/23 203 lb (92.1 kg)  10/07/23 202 lb 6.1 oz (91.8 kg)    GEN: Well nourished, well developed in no acute distress NECK: No JVD; No carotid bruits CARDIAC: ***RRR, no murmurs, rubs, gallops RESPIRATORY:  Clear to auscultation without rales, wheezing or rhonchi  ABDOMEN: Soft, non-tender, non-distended EXTREMITIES:  No edema; No deformity   ASSESSMENT AND PLAN ***    {Are you ordering a CV Procedure (e.g. stress test, cath, DCCV, TEE, etc)?   Press F2        :[id number]}  Dispo: ***  Signed, Scot Ford, PA

## 2023-10-22 LAB — PRO B NATRIURETIC PEPTIDE: NT-Pro BNP: 299 pg/mL (ref 0–486)

## 2023-10-25 ENCOUNTER — Ambulatory Visit: Payer: Self-pay | Admitting: Physician Assistant

## 2023-10-26 ENCOUNTER — Encounter (HOSPITAL_COMMUNITY)
Admission: RE | Admit: 2023-10-26 | Discharge: 2023-10-26 | Disposition: A | Discharge status: Home / Self Care | Source: Ambulatory Visit | Attending: Internal Medicine | Admitting: Internal Medicine

## 2023-10-26 VITALS — Wt 201.3 lb

## 2023-10-26 DIAGNOSIS — J449 Chronic obstructive pulmonary disease, unspecified: Secondary | ICD-10-CM | POA: Diagnosis not present

## 2023-10-26 NOTE — Progress Notes (Signed)
 Daily Session Note  Patient Details  Name: Joseph Soto MRN: [id number] Date of Birth: [birth year] Referring Provider:   Conrad Soto Pulmonary Rehab Walk Test from 09/13/2023 in [facility]  Referring Provider Joseph Soto    Encounter Date: 10/26/2023  Check In:  Session Check In - 10/26/23 1125       Check-In   Supervising physician immediately available to respond to emergencies [facility] MD immediately available    Physician(s) Joseph Mose, NP    Location [facility]    Staff Present Joseph Soto, Joseph Moats, MS, ACSM-CEP, Exercise Physiologist;Joseph Harvy, RN, BSN;Joseph Soto BS, ACSM-CEP, Exercise Physiologist;Joseph Lennon, RN, BSN    Virtual Visit No    Medication changes reported     No    Fall or balance concerns reported    No    Tobacco Cessation No Change    Warm-up and Cool-down Performed as group-led instruction    Resistance Training Performed Yes    VAD Patient? No    PAD/SET Patient? No      Pain Assessment   Currently in Pain? No/denies    Multiple Pain Sites No          Capillary Blood Glucose: No results found for this or any previous visit (from the past 24 hours).   Exercise Prescription Changes - 10/26/23 1200       Response to Exercise   Blood Pressure (Admit) 126/64    Blood Pressure (Exercise) 128/56    Blood Pressure (Exit) 112/56    Heart Rate (Admit) 89 bpm    Heart Rate (Exercise) 102 bpm    Heart Rate (Exit) 86 bpm    Oxygen  Saturation (Admit) 95 %    Oxygen  Saturation (Exercise) 95 %    Oxygen  Saturation (Exit) 98 %    Rating of Perceived Exertion (Exercise) 11    Perceived Dyspnea (Exercise) 2    Duration Continue with 30 min of aerobic exercise without signs/symptoms of physical distress.    Intensity THRR unchanged      Progression   Progression Continue to progress workloads to maintain intensity without signs/symptoms of physical distress.       Resistance Training   Training Prescription Yes    Weight black bands    Reps 10-15    Time 10 Minutes      Treadmill   MPH 2.2    Grade 1.5    Minutes 15    METs 2.8      Bike   Level 2    Minutes 15    METs 2.7          Social History   Tobacco Use  Smoking Status Former   Current packs/day: 0.00   Average packs/day: 2.0 packs/day for 30.0 years (60.0 ttl pk-yrs)   Types: Cigarettes   Start date: 05/17/1970   Quit date: 05/16/2000   Years since quitting: 23.4  Smokeless Tobacco Never  Tobacco Comments   quit 06/2005    Goals Met:  Independence with exercise equipment Exercise tolerated well No report of concerns or symptoms today Strength training completed today  Goals Unmet:  Not Applicable  Comments: Service time is from 1014 to 1132.    Dr. Slater Staff is Medical Director for Pulmonary Rehab at [facility]

## 2023-10-28 ENCOUNTER — Encounter (HOSPITAL_COMMUNITY)
Admission: RE | Admit: 2023-10-28 | Discharge: 2023-10-28 | Disposition: A | Discharge status: Home / Self Care | Source: Ambulatory Visit | Attending: Internal Medicine

## 2023-10-28 DIAGNOSIS — J449 Chronic obstructive pulmonary disease, unspecified: Secondary | ICD-10-CM

## 2023-10-28 NOTE — Progress Notes (Signed)
 Daily Session Note  Patient Details  Name: Joseph Soto MRN: [id number] Date of Birth: [birth year] Referring Provider:   [name] Pulmonary Rehab Walk Test from 09/13/2023 in [facility]  Referring Provider [name]    Encounter Date: 10/28/2023  Check In:  Session Check In - 10/28/23 1039       Check-In   Supervising physician immediately available to respond to emergencies [facility] MD immediately available    Physician(s) Damien Braver, NP    [facility]    Staff Present Augustin Sharps, Neita Moats, MS, ACSM-CEP, Exercise Physiologist;Mary Bastin, RN, BSN;Nefertiti Mohamad BS, ACSM-CEP, Exercise Physiologist    Virtual Visit No    Medication changes reported     No    Fall or balance concerns reported    No    Comments Pt states his balance has gotten worse over the past couple of months    Tobacco Cessation No Change    Warm-up and Cool-down Performed as group-led instruction    Resistance Training Performed Yes    VAD Patient? No    PAD/SET Patient? No      Pain Assessment   Currently in Pain? No/denies          Capillary Blood Glucose: No results found for this or any previous visit (from the past 24 hours).    Social History   Tobacco Use  Smoking Status Former   Current packs/day: 0.00   Average packs/day: 2.0 packs/day for 30.0 years (60.0 ttl pk-yrs)   Types: Cigarettes   Start date: 05/17/1970   Quit date: 05/16/2000   Years since quitting: 23.4  Smokeless Tobacco Never  Tobacco Comments   quit 06/2005    Goals Met:  Independence with exercise equipment Exercise tolerated well No report of concerns or symptoms today Strength training completed today  Goals Unmet:  Not Applicable  Comments: Service time is from 1015 to 1153.    Dr. Slater Staff is Medical Director for Pulmonary Rehab at [facility]

## 2023-11-02 ENCOUNTER — Encounter (HOSPITAL_COMMUNITY)
Admission: RE | Admit: 2023-11-02 | Discharge: 2023-11-02 | Disposition: A | Discharge status: Home / Self Care | Source: Ambulatory Visit | Attending: Internal Medicine

## 2023-11-02 DIAGNOSIS — M5481 Occipital neuralgia: Secondary | ICD-10-CM | POA: Diagnosis not present

## 2023-11-02 DIAGNOSIS — J449 Chronic obstructive pulmonary disease, unspecified: Secondary | ICD-10-CM

## 2023-11-02 DIAGNOSIS — M545 Low back pain, unspecified: Secondary | ICD-10-CM | POA: Diagnosis not present

## 2023-11-02 NOTE — Progress Notes (Signed)
 Daily Session Note  Patient Details  Name: Joseph Soto MRN: [id number] Date of Birth: 1945/03/07 Referring Provider:   [name] Pulmonary Rehab Walk Test from 09/13/2023 in [facility], [facility], & [facility]  Referring Provider [name]    Encounter Date: 11/02/2023  Check In:  Session Check In - 11/02/23 1050       Check-In   Supervising physician immediately available to respond to emergencies [facility] MD immediately available    Physician(s) Damien Braver, NP    [facility]    Staff Present Augustin Sharps, Neita Moats, MS, ACSM-CEP, Exercise Physiologist;Mary Harvy, RN, BSN;Randi Reeve BS, ACSM-CEP, Exercise Physiologist    Virtual Visit No    Medication changes reported     No    Fall or balance concerns reported    No    Tobacco Cessation No Change    Warm-up and Cool-down Performed as group-led instruction    Resistance Training Performed Yes    VAD Patient? No    PAD/SET Patient? No      Pain Assessment   Currently in Pain? No/denies          Capillary Blood Glucose: No results found for this or any previous visit (from the past 24 hours).    Social History   Tobacco Use  Smoking Status Former   Current packs/day: 0.00   Average packs/day: 2.0 packs/day for 30.0 years (60.0 ttl pk-yrs)   Types: Cigarettes   Start date: 05/17/1970   Quit date: 05/16/2000   Years since quitting: 23.4  Smokeless Tobacco Never  Tobacco Comments   quit 06/2005    Goals Met:  Proper associated with RPD/PD & O2 Sat Independence with exercise equipment Exercise tolerated well No report of concerns or symptoms today Strength training completed today  Goals Unmet:  Not Applicable  Comments: Service time is from 1011 to 1139.    Dr. Slater Staff is Medical Director for Pulmonary Rehab at [facility].

## 2023-11-04 ENCOUNTER — Encounter (HOSPITAL_COMMUNITY)
Admission: RE | Admit: 2023-11-04 | Discharge: 2023-11-04 | Disposition: A | Discharge status: Home / Self Care | Source: Ambulatory Visit | Attending: Internal Medicine

## 2023-11-04 DIAGNOSIS — J449 Chronic obstructive pulmonary disease, unspecified: Secondary | ICD-10-CM | POA: Diagnosis not present

## 2023-11-04 NOTE — Progress Notes (Signed)
 Daily Session Note  Patient Details  Name: Joseph Soto MRN: [id number] Date of Birth: 08-Jun-1945 Referring Provider:   [name] Pulmonary Rehab Walk Test from 09/13/2023 in [facility]  Referring Provider [name]    Encounter Date: 11/04/2023  Check In:  Session Check In - 11/04/23 1035       Check-In   Supervising physician immediately available to respond to emergencies [facility] MD immediately available    Physician(s) Lum Louis, NP    [facility]    Staff Present Augustin Sharps, Candia Levin, RN, BSN;Judit Awad [employer], ACSM-CEP, Exercise Physiologist;David [place], MS, ACSM-CEP, CCRP, Exercise Physiologist    Virtual Visit No    Medication changes reported     No    Fall or balance concerns reported    No    Tobacco Cessation No Change    Warm-up and Cool-down Performed as group-led instruction    Resistance Training Performed Yes    VAD Patient? No    PAD/SET Patient? No      Pain Assessment   Currently in Pain? No/denies    Multiple Pain Sites No          Capillary Blood Glucose: No results found for this or any previous visit (from the past 24 hours).    Social History   Tobacco Use  Smoking Status Former   Current packs/day: 0.00   Average packs/day: 2.0 packs/day for 30.0 years (60.0 ttl pk-yrs)   Types: Cigarettes   Start date: 05/17/1970   Quit date: 05/16/2000   Years since quitting: 23.4  Smokeless Tobacco Never  Tobacco Comments   quit 06/2005    Goals Met:  Independence with exercise equipment Exercise tolerated well No report of concerns or symptoms today Strength training completed today  Goals Unmet:  Not Applicable  Comments: Service time is from 1018 to 1146.    Dr. Slater Staff is Medical Director for Pulmonary Rehab at [facility].

## 2023-11-06 DIAGNOSIS — J449 Chronic obstructive pulmonary disease, unspecified: Secondary | ICD-10-CM | POA: Diagnosis not present

## 2023-11-09 ENCOUNTER — Encounter (HOSPITAL_COMMUNITY)
Admission: RE | Admit: 2023-11-09 | Discharge: 2023-11-09 | Disposition: A | Discharge status: Home / Self Care | Source: Ambulatory Visit | Attending: Internal Medicine | Admitting: Internal Medicine

## 2023-11-09 VITALS — Wt 203.3 lb

## 2023-11-09 DIAGNOSIS — J449 Chronic obstructive pulmonary disease, unspecified: Secondary | ICD-10-CM | POA: Diagnosis not present

## 2023-11-09 NOTE — Progress Notes (Signed)
 Daily Session Note  Patient Details  Name: Joseph Soto MRN: [id number] Date of Birth: Feb 04, 1946 Referring Provider:   [name] Walk Test from 09/13/2023 in [facility]  Referring Provider [name]    Encounter Date: 11/09/2023  Check In:  Session Check In - 11/09/23 1121       Check-In   Supervising physician immediately available to respond to emergencies [facility] MD immediately available    Physician(s) Rosaline Skains, NP    Location [facility]    Staff Present Augustin Sharps, Candia Levin, RN, BSN;Carlette Bernett, RN, Maud Moats, MS, ACSM-CEP, Exercise Physiologist    Virtual Visit No    Medication changes reported     No    Fall or balance concerns reported    No    Tobacco Cessation No Change    Warm-up and Cool-down Performed as group-led instruction    Resistance Training Performed Yes    VAD Patient? No    PAD/SET Patient? No      Pain Assessment   Currently in Pain? No/denies    Multiple Pain Sites No          Capillary Blood Glucose: No results found for this or any previous visit (from the past 24 hours).   Exercise Prescription Changes - 11/09/23 1100       Response to Exercise   Blood Pressure (Admit) 124/60    Blood Pressure (Exercise) 132/62    Blood Pressure (Exit) 118/60    Heart Rate (Admit) 69 bpm    Heart Rate (Exercise) 100 bpm    Heart Rate (Exit) 83 bpm    Oxygen  Saturation (Admit) 98 %    Oxygen  Saturation (Exercise) 95 %    Oxygen  Saturation (Exit) 96 %    Rating of Perceived Exertion (Exercise) 10    Perceived Dyspnea (Exercise) 1    Duration Continue with 30 min of aerobic exercise without signs/symptoms of physical distress.    Intensity THRR unchanged      Progression   Progression Continue to progress workloads to maintain intensity without signs/symptoms of physical distress.      Resistance Training   Training Prescription  Yes    Weight black bands    Reps 10-15    Time 10 Minutes      Treadmill   MPH 2.4    Grade 1.5    Minutes 15    METs 3      Bike   Level 2.5    Minutes 15    METs 3          Social History   Tobacco Use  Smoking Status Former   Current packs/day: 0.00   Average packs/day: 2.0 packs/day for 30.0 years (60.0 ttl pk-yrs)   Types: Cigarettes   Start date: 05/17/1970   Quit date: 05/16/2000   Years since quitting: 23.4  Smokeless Tobacco Never  Tobacco Comments   quit 06/2005    Goals Met:  Proper associated with RPD/PD & O2 Sat Independence with exercise equipment Exercise tolerated well No report of concerns or symptoms today Strength training completed today  Goals Unmet:  Not Applicable  Comments: Service time is from 1012 to 1140.    Dr. Slater Staff is Medical Director for Pulmonary Rehab at [facility].

## 2023-11-10 NOTE — Progress Notes (Signed)
 Pulmonary Individual Treatment Plan  Patient Details  Name: Joseph Soto MRN: [id number] Date of Birth: [birth year] Referring Provider:   [name] Pulmonary Rehab Walk Test from 09/13/2023 in [facility] for [facility]  Referring Provider [name]    Initial Encounter Date:  Flowsheet Row Pulmonary Rehab Walk Test from 09/13/2023 in [facility] for Heart, Vascular, [facility] Lung Health  Date 09/13/23    Visit Diagnosis: Stage 2 moderate COPD by GOLD classification (HCC)  Patient's Home Medications on Admission:   Current Outpatient Medications:    acetaminophen  (TYLENOL ) 500 MG tablet, Take 500-1,000 mg by mouth every 6 (six) hours as needed (pain.)., Disp: , Rfl:    albuterol  (PROVENTIL ) (2.5 MG/3ML) 0.083% nebulizer solution, Take 3 mLs (2.5 mg total) by nebulization every 6 (six) hours as needed for wheezing or shortness of breath., Disp: 75 mL, Rfl: 12   albuterol  (VENTOLIN  HFA) 108 (90 Base) MCG/ACT inhaler, Inhale into the lungs every 6 (six) hours as needed for wheezing or shortness of breath., Disp: , Rfl:    amLODipine  (NORVASC ) 5 MG tablet, Take 5 mg by mouth at bedtime., Disp: , Rfl:    aspirin  EC 81 MG tablet, Take 81 mg by mouth at bedtime., Disp: , Rfl:    azelastine  (ASTELIN ) 0.1 % nasal spray, Place 1 spray into both nostrils 2 (two) times daily. Use in each nostril as directed, Disp: 30 mL, Rfl: 12   budesonide-glycopyrrolate -formoterol (BREZTRI  AEROSPHERE) 160-9-4.8 MCG/ACT AERO inhaler, Inhale 2 puffs into the lungs in the morning and at bedtime., Disp: 10.7 g, Rfl: 12   cholecalciferol  (VITAMIN D ) 1000 units tablet, Take 2,000 Units by mouth daily with breakfast., Disp: , Rfl:    clopidogrel  (PLAVIX ) 75 MG tablet, Take 1 tablet (75 mg total) by mouth daily., Disp: 90 tablet, Rfl: 3   Coenzyme Q10 300 MG CAPS, Take 300 mg by mouth daily., Disp: , Rfl:    cyclobenzaprine  (FLEXERIL ) 10 MG tablet, Take 10 mg by  mouth 2 (two) times daily., Disp: , Rfl:    doxycycline (VIBRA-TABS) 100 MG tablet, 1 tablet Orally twice a day for 10 days (Patient not taking: Reported on 10/21/2023), Disp: , Rfl:    ezetimibe  (ZETIA ) 10 MG tablet, TAKE 1 TABLET BY MOUTH EVERY DAY, Disp: 90 tablet, Rfl: 1   fluticasone  (FLONASE ) 50 MCG/ACT nasal spray, Place 1 spray into both nostrils daily as needed for rhinitis or allergies., Disp: , Rfl:    Fluticasone -Umeclidin-Vilant (TRELEGY ELLIPTA) 200-62.5-25 MCG/ACT AEPB, Inhale 1 puff into the lungs as needed., Disp: , Rfl:    isosorbide  mononitrate (IMDUR ) 30 MG 24 hr tablet, Take 1 tablet (30 mg total) by mouth daily. Please schedule yearly appointment for future refills. 1st attempt. Thank you, Disp: 45 tablet, Rfl: 0   metoprolol  succinate (TOPROL -XL) 25 MG 24 hr tablet, Take 1 tablet (25 mg total) by mouth in the morning and at bedtime., Disp: 180 tablet, Rfl: 3   mometasone (ELOCON) 0.1 % cream, Apply 1 Application topically as needed., Disp: , Rfl:    naltrexone  (DEPADE) 50 MG tablet, Take 50 mg by mouth 2 (two) times daily., Disp: , Rfl:    neomycin-polymyxin b-dexamethasone  (MAXITROL) 3.5-10000-0.1 SUSP, Place 1 drop into the left eye 3 (three) times daily., Disp: , Rfl:    nitroGLYCERIN  (NITROSTAT ) 0.4 MG SL tablet, Place 1 tablet (0.4 mg total) under the tongue every 5 (five) minutes as needed for chest pain., Disp: 25 tablet,  Rfl: 3   OHTUVAYRE  3 MG/2.5ML SUSP, Inhale 3 mg into the lungs 2 (two) times daily., Disp: , Rfl:    rosuvastatin  (CRESTOR ) 40 MG tablet, Take 1 tablet (40 mg total) by mouth daily., Disp: 90 tablet, Rfl: 0   Spacer/Aero-Holding Chambers (AEROCHAMBER MV) inhaler, Use as instructed, Disp: 1 each, Rfl: 0   traMADol  (ULTRAM ) 50 MG tablet, Take 50 mg by mouth every 12 (twelve) hours as needed for moderate pain (pain score 4-6)., Disp: , Rfl:   Past Medical History: Past Medical History:  Diagnosis Date   Allergic rhinitis    grass, dust   Buttock pain     CAD (coronary artery disease)    Last cath 2010 per Dr. 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 Arnita office)   Myeloma [facility])    found it in my chest when they did the excision   Myocardial infarction (HCC) 06/2005   Non Hodgkin's lymphoma (HCC) dx'd 06/2005    Tobacco Use: Social History   Tobacco Use  Smoking Status Former   Current packs/day: 0.00   Average packs/day: 2.0 packs/day for 30.0 years (60.0 ttl pk-yrs)   Types: Cigarettes   Start date: 05/17/1970   Quit date: 05/16/2000   Years since quitting: 23.5  Smokeless Tobacco Never  Tobacco Comments   quit 06/2005    Labs: Review Flowsheet  More data exists      Latest Ref Rng & Units 09/14/2011 11/30/2011 05/02/2012 12/23/2015 01/24/2018  Labs for ITP Cardiac and Pulmonary Rehab  Cholestrol 100 - 199 mg/dL 882  828  818  - 883   LDL (calc) 0 - 99 mg/dL 53  890  875  - 52   HDL-C >39 mg/dL 47.29  47.19  54.29  - 46   Trlycerides 0 - 149 mg/dL 41.9  53.9  40.9  - 90   TCO2 0 - 100 mmol/L - - - 22  -    Capillary Blood Glucose: Lab Results  Component Value Date   GLUCAP 97 01/24/2013   GLUCAP 86 01/24/2013   GLUCAP 89 01/24/2013     Pulmonary Assessment Scores:  Pulmonary Assessment Scores     Row Name 09/13/23 1127         ADL UCSD   ADL Phase Entry     SOB Score total 41       CAT Score   CAT Score 21       mMRC Score    mMRC Score 2       UCSD: Self-administered rating of dyspnea associated with activities of daily living (ADLs) 6-point scale (0 = not at all to 5 = maximal or unable to do because of breathlessness)  Scoring Scores range from 0 to 120.  Minimally important difference is 5 units  CAT: CAT  can identify the health impairment of COPD patients and is better correlated with disease progression.  CAT has a scoring range of zero to 40. The CAT score is classified into four groups of low (less than 10), medium (10 - 20), high (21-30) and very high (31-40) based on the impact level of disease on health status. A CAT score over 10 suggests significant symptoms.  A worsening CAT score could be explained by an exacerbation, poor medication adherence, poor inhaler technique, or progression of COPD or comorbid conditions.  CAT MCID is 2 points  mMRC: mMRC (Modified Medical Research Council) Dyspnea Scale is used to assess the degree of baseline functional disability in patients of respiratory disease due to dyspnea. No minimal important difference is established. A decrease in score of 1 point or greater is considered a positive change.   Pulmonary Function Assessment:  Pulmonary Function Assessment - 09/13/23 1102       Breath   Bilateral Breath Sounds Clear    Shortness of Breath Yes;Fear of Shortness of Breath;Limiting activity          Exercise Target Goals: Exercise Program Goal: Individual exercise prescription set using results from initial 6 min walk test and THRR while considering  patient's activity barriers and safety.   Exercise Prescription Goal: Initial exercise prescription builds to 30-45 minutes a day of aerobic activity, 2-3 days per week.  Home exercise guidelines will be given to patient during program as part of exercise prescription that the participant will acknowledge.  Activity Barriers & Risk Stratification:  Activity Barriers & Cardiac Risk Stratification -  09/13/23 1100       Activity Barriers & Cardiac Risk Stratification   Activity Barriers Deconditioning;Muscular Weakness;Shortness of Breath;Fibromyalgia;Balance Concerns    Cardiac Risk Stratification Moderate          6 Minute Walk:  6 Minute Walk     Row Name 09/13/23 1147         6 Minute Walk   Phase Initial     Distance 1021 feet     Walk Time 6 minutes     # of Rest Breaks 0     MPH 1.93     METS 1.81     RPE 12     Perceived Dyspnea  1     VO2 Peak 6.34     Symptoms No     Resting HR 85 bpm     Resting BP 126/60     Resting Oxygen  Saturation  95 %     Exercise Oxygen  Saturation  during 6 min walk 91 %     Max Ex. HR 89 bpm     Max Ex. BP 126/60     2 Minute Post BP 112/60       Interval HR   1 Minute HR 79     2 Minute HR 83     3 Minute HR 89     4 Minute HR 87     5 Minute HR 79     6 Minute HR 82     2 Minute Post HR 77     Interval Heart Rate? Yes       Interval Oxygen    Interval Oxygen ? Yes     Baseline Oxygen  Saturation % 95 %     1 Minute Oxygen  Saturation % 91 %     1 Minute Liters of Oxygen  0 L     2 Minute Oxygen  Saturation % 95 %  2 Minute Liters of Oxygen  0 L     3 Minute Oxygen  Saturation % 91 %     3 Minute Liters of Oxygen  0 L     4 Minute Oxygen  Saturation % 96 %     4 Minute Liters of Oxygen  0 L     5 Minute Oxygen  Saturation % 93 %     5 Minute Liters of Oxygen  0 L     6 Minute Oxygen  Saturation % 91 %     6 Minute Liters of Oxygen  0 L     2 Minute Post Oxygen  Saturation % 98 %     2 Minute Post Liters of Oxygen  0 L        Oxygen  Initial Assessment:  Oxygen  Initial Assessment - 09/13/23 1102       Home Oxygen    Home Oxygen  Device Home Concentrator    Sleep Oxygen  Prescription Continuous    Liters per minute 2.5    Home Exercise Oxygen  Prescription None    Home Resting Oxygen  Prescription None    Compliance with Home Oxygen  Use Yes      Initial 6 min Walk   Oxygen  Used None      Program Oxygen  Prescription    Program Oxygen  Prescription None      Intervention   Short Term Goals To learn and exhibit compliance with exercise, home and travel O2 prescription;To learn and understand importance of monitoring SPO2 with pulse oximeter and demonstrate accurate use of the pulse oximeter.;To learn and understand importance of maintaining oxygen  saturations>88%;To learn and demonstrate proper pursed lip breathing techniques or other breathing techniques. ;To learn and demonstrate proper use of respiratory medications    Long  Term Goals Exhibits compliance with exercise, home  and travel O2 prescription;Maintenance of O2 saturations>88%;Compliance with respiratory medication;Verbalizes importance of monitoring SPO2 with pulse oximeter and return demonstration;Exhibits proper breathing techniques, such as pursed lip breathing or other method taught during program session;Demonstrates proper use of MDI's          Oxygen  Re-Evaluation:  Oxygen  Re-Evaluation     Row Name 09/13/23 1102 10/06/23 0901 11/01/23 0926         Program Oxygen  Prescription   Program Oxygen  Prescription -- None None       Home Oxygen    Home Oxygen  Device -- Home Concentrator Home Concentrator     Sleep Oxygen  Prescription -- Continuous Continuous     Liters per minute -- 2.5 2.5     Home Exercise Oxygen  Prescription -- None None     Home Resting Oxygen  Prescription -- None None     Compliance with Home Oxygen  Use -- Yes Yes       Goals/Expected Outcomes   Short Term Goals -- To learn and exhibit compliance with exercise, home and travel O2 prescription;To learn and understand importance of monitoring SPO2 with pulse oximeter and demonstrate accurate use of the pulse oximeter.;To learn and understand importance of maintaining oxygen  saturations>88%;To learn and demonstrate proper pursed lip breathing techniques or other breathing techniques. ;To learn and demonstrate proper use of respiratory medications To learn and exhibit  compliance with exercise, home and travel O2 prescription;To learn and understand importance of monitoring SPO2 with pulse oximeter and demonstrate accurate use of the pulse oximeter.;To learn and understand importance of maintaining oxygen  saturations>88%;To learn and demonstrate proper pursed lip breathing techniques or other breathing techniques. ;To learn and demonstrate proper use of respiratory medications     Long  Term Goals --  Exhibits compliance with exercise, home  and travel O2 prescription;Maintenance of O2 saturations>88%;Compliance with respiratory medication;Verbalizes importance of monitoring SPO2 with pulse oximeter and return demonstration;Exhibits proper breathing techniques, such as pursed lip breathing or other method taught during program session;Demonstrates proper use of MDI's Exhibits compliance with exercise, home  and travel O2 prescription;Maintenance of O2 saturations>88%;Compliance with respiratory medication;Verbalizes importance of monitoring SPO2 with pulse oximeter and return demonstration;Exhibits proper breathing techniques, such as pursed lip breathing or other method taught during program session;Demonstrates proper use of MDI's     Goals/Expected Outcomes Compliance and understanding of oxygen  saturation monitoring and breathing techniques to decrease shortness of breath. Compliance and understanding of oxygen  saturation monitoring and breathing techniques to decrease shortness of breath. Compliance and understanding of oxygen  saturation monitoring and breathing techniques to decrease shortness of breath.        Oxygen  Discharge (Final Oxygen  Re-Evaluation):  Oxygen  Re-Evaluation - 11/01/23 0926       Program Oxygen  Prescription   Program Oxygen  Prescription None      Home Oxygen    Home Oxygen  Device Home Concentrator    Sleep Oxygen  Prescription Continuous    Liters per minute 2.5    Home Exercise Oxygen  Prescription None    Home Resting Oxygen  Prescription  None    Compliance with Home Oxygen  Use Yes      Goals/Expected Outcomes   Short Term Goals To learn and exhibit compliance with exercise, home and travel O2 prescription;To learn and understand importance of monitoring SPO2 with pulse oximeter and demonstrate accurate use of the pulse oximeter.;To learn and understand importance of maintaining oxygen  saturations>88%;To learn and demonstrate proper pursed lip breathing techniques or other breathing techniques. ;To learn and demonstrate proper use of respiratory medications    Long  Term Goals Exhibits compliance with exercise, home  and travel O2 prescription;Maintenance of O2 saturations>88%;Compliance with respiratory medication;Verbalizes importance of monitoring SPO2 with pulse oximeter and return demonstration;Exhibits proper breathing techniques, such as pursed lip breathing or other method taught during program session;Demonstrates proper use of MDI's    Goals/Expected Outcomes Compliance and understanding of oxygen  saturation monitoring and breathing techniques to decrease shortness of breath.          Initial Exercise Prescription:  Initial Exercise Prescription - 09/13/23 1100       Date of Initial Exercise RX and Referring Provider   Date 09/13/23    Referring Provider [name]    Expected Discharge Date 12/09/23      Treadmill   MPH 1.4    Grade 0    Minutes 15    METs 2.07      Bike   Level 1    Watts 50    Minutes 15    METs 2      Prescription Details   Frequency (times per week) 2    Duration Progress to 30 minutes of continuous aerobic without signs/symptoms of physical distress      Intensity   THRR 40-80% of Max Heartrate 56-113    Ratings of Perceived Exertion 11-13    Perceived Dyspnea 0-4      Progression   Progression Continue to progress workloads to maintain intensity without signs/symptoms of physical distress.      Resistance Training   Training Prescription Yes    Weight blue bands    Reps  10-15          Perform Capillary Blood Glucose checks as needed.  Exercise Prescription Changes:   Exercise Prescription Changes  Row Name 10/07/23 1208 10/26/23 1200 11/09/23 1100         Response to Exercise   Blood Pressure (Admit) 132/64 126/64 124/60     Blood Pressure (Exercise) -- 128/56 132/62     Blood Pressure (Exit) 112/68 112/56 118/60     Heart Rate (Admit) 80 bpm 89 bpm 69 bpm     Heart Rate (Exercise) 99 bpm 102 bpm 100 bpm     Heart Rate (Exit) 85 bpm 86 bpm 83 bpm     Oxygen  Saturation (Admit) 100 % 95 % 98 %     Oxygen  Saturation (Exercise) 92 % 95 % 95 %     Oxygen  Saturation (Exit) 97 % 98 % 96 %     Rating of Perceived Exertion (Exercise) 10 11 10      Perceived Dyspnea (Exercise) 1 2 1      Duration Continue with 30 min of aerobic exercise without signs/symptoms of physical distress. Continue with 30 min of aerobic exercise without signs/symptoms of physical distress. Continue with 30 min of aerobic exercise without signs/symptoms of physical distress.     Intensity THRR unchanged THRR unchanged THRR unchanged       Progression   Progression -- Continue to progress workloads to maintain intensity without signs/symptoms of physical distress. Continue to progress workloads to maintain intensity without signs/symptoms of physical distress.       Resistance Training   Training Prescription Yes Yes Yes     Weight blue bands black bands black bands     Reps 10-15 10-15 10-15     Time 10 Minutes 10 Minutes 10 Minutes       Treadmill   MPH 2.4 2.2 2.4     Grade 0 1.5 1.5     Minutes 15 15 15      METs 2.7 2.8 3       Bike   Level 2 2 2.5     Minutes 15 15 15      METs 2.7 2.7 3        Exercise Comments:   Exercise Comments     Row Name 09/21/23 0857           Exercise Comments Pt completed first day of exericse. He exercised for 15 min on the treadmill and upright bike. He performed the warmup and cooldown standing without limitations.  Discussed METs.          Exercise Goals and Review:   Exercise Goals     Row Name 09/13/23 1039             Exercise Goals   Increase Physical Activity Yes       Intervention Provide advice, education, support and counseling about physical activity/exercise needs.;Develop an individualized exercise prescription for aerobic and resistive training based on initial evaluation findings, risk stratification, comorbidities and participant's personal goals.       Expected Outcomes Short Term: Attend rehab on a regular basis to increase amount of physical activity.;Long Term: Add in home exercise to make exercise part of routine and to increase amount of physical activity.;Long Term: Exercising regularly at least 3-5 days a week.       Increase Strength and Stamina Yes       Intervention Provide advice, education, support and counseling about physical activity/exercise needs.;Develop an individualized exercise prescription for aerobic and resistive training based on initial evaluation findings, risk stratification, comorbidities and participant's personal goals.       Expected Outcomes Short Term: Increase workloads  from initial exercise prescription for resistance, speed, and METs.;Short Term: Perform resistance training exercises routinely during rehab and add in resistance training at home;Long Term: Improve cardiorespiratory fitness, muscular endurance and strength as measured by increased METs and functional capacity ( )       Able to understand and use rate of perceived exertion (RPE) scale Yes       Intervention Provide education and explanation on how to use RPE scale       Expected Outcomes Short Term: Able to use RPE daily in rehab to express subjective intensity level;Long Term:  Able to use RPE to guide intensity level when exercising independently       Able to understand and use Dyspnea scale Yes       Intervention Provide education and explanation on how to use Dyspnea scale        Expected Outcomes Short Term: Able to use Dyspnea scale daily in rehab to express subjective sense of shortness of breath during exertion;Long Term: Able to use Dyspnea scale to guide intensity level when exercising independently       Knowledge and understanding of Target Heart Rate Range (THRR) Yes       Intervention Provide education and explanation of THRR including how the numbers were predicted and where they are located for reference       Expected Outcomes Short Term: Able to state/look up THRR;Long Term: Able to use THRR to govern intensity when exercising independently;Short Term: Able to use daily as guideline for intensity in rehab       Understanding of Exercise Prescription Yes       Intervention Provide education, explanation, and written materials on patient's individual exercise prescription       Expected Outcomes Short Term: Able to explain program exercise prescription;Long Term: Able to explain home exercise prescription to exercise independently          Exercise Goals Re-Evaluation :  Exercise Goals Re-Evaluation     Row Name 09/13/23 1202 10/06/23 0859 11/01/23 0925         Exercise Goal Re-Evaluation   Exercise Goals Review Increase Physical Activity;Able to understand and use Dyspnea scale;Understanding of Exercise Prescription;Increase Strength and Stamina;Knowledge and understanding of Target Heart Rate Range (THRR);Able to understand and use rate of perceived exertion (RPE) scale Increase Physical Activity;Able to understand and use Dyspnea scale;Understanding of Exercise Prescription;Increase Strength and Stamina;Knowledge and understanding of Target Heart Rate Range (THRR);Able to understand and use rate of perceived exertion (RPE) scale Increase Physical Activity;Able to understand and use Dyspnea scale;Understanding of Exercise Prescription;Increase Strength and Stamina;Knowledge and understanding of Target Heart Rate Range (THRR);Able to understand and use rate of  perceived exertion (RPE) scale     Comments Pt is scheduled to begin exercise on 8/12. Will continue to monitor and progress as able. Joseph Soto has completed 5 exercise sessions. He exercises for 15 min on the treadmill and upright bike. He averages 2.5 METs at 2.2 mph on the treadmill and 2.7 METs at level 1-2 on the upright bike. Pt performs the warmup and cooldown standing without limitations. He has increased his speed on the treadmill and level on the bike as he tolerates progressions well. Will continue to monitor and progress as able. Joseph Soto has completed 10 exercise sessions. He exercises for 15 min on the treadmill and upright bike. He averages 2.8 METs at 2.2 mph and 1.5% incline on the treadmill and 2.5 METs at level 1 on the upright bike. Pt performs the warmup  and cooldown standing without limitations. He has increased his incine on the treadmill. I am unsure how well he tolerates the bike as level has been decreased. Will continue to monitor and progress as able.     Expected Outcomes Through exercise at rehab and home, the patient will decrease shortness of breath with daily activities and feel confident in carrying out an exercise regimen at home. Through exercise at rehab and home, the patient will decrease shortness of breath with daily activities and feel confident in carrying out an exercise regimen at home. Through exercise at rehab and home, the patient will decrease shortness of breath with daily activities and feel confident in carrying out an exercise regimen at home.        Discharge Exercise Prescription (Final Exercise Prescription Changes):  Exercise Prescription Changes - 11/09/23 1100       Response to Exercise   Blood Pressure (Admit) 124/60    Blood Pressure (Exercise) 132/62    Blood Pressure (Exit) 118/60    Heart Rate (Admit) 69 bpm    Heart Rate (Exercise) 100 bpm    Heart Rate (Exit) 83 bpm    Oxygen  Saturation (Admit) 98 %    Oxygen  Saturation (Exercise) 95 %     Oxygen  Saturation (Exit) 96 %    Rating of Perceived Exertion (Exercise) 10    Perceived Dyspnea (Exercise) 1    Duration Continue with 30 min of aerobic exercise without signs/symptoms of physical distress.    Intensity THRR unchanged      Progression   Progression Continue to progress workloads to maintain intensity without signs/symptoms of physical distress.      Resistance Training   Training Prescription Yes    Weight black bands    Reps 10-15    Time 10 Minutes      Treadmill   MPH 2.4    Grade 1.5    Minutes 15    METs 3      Bike   Level 2.5    Minutes 15    METs 3          Nutrition:  Target Goals: Understanding of nutrition guidelines, daily intake of sodium 1500mg , cholesterol 200mg , calories 30% from fat and 7% or less from saturated fats, daily to have 5 or more servings of fruits and vegetables.  Biometrics:  Pre Biometrics - 09/13/23 1151       Pre Biometrics   Grip Strength 25 kg           Nutrition Therapy Plan and Nutrition Goals:  Nutrition Therapy & Goals - 09/21/23 1223       Nutrition Therapy   Diet Heart Healthy Diet    Drug/Food Interactions Statins/Certain Fruits      Personal Nutrition Goals   Nutrition Goal Patient to improve diet quality by using the plate method as a guide for meal planning to include lean protein/plant protein, fruits, vegetables, whole grains, nonfat dairy as part of a well-balanced diet.    Comments Joseph Soto has medical history of CAD s/p CABG, hyperlipidemia, ischemic cardiomyopathy, unstable angina, COPD2. LDL is well controlled on zetia , statin. He does follow a regular diet. Patient will benefit from participation in pulmonary rehab for nutrition education, exercise, and lifestyle modification.      Intervention Plan   Intervention Prescribe, educate and counsel regarding individualized specific dietary modifications aiming towards targeted core components such as weight, hypertension, lipid management,  diabetes, heart failure and other comorbidities.;Nutrition handout(s) given to patient.  Expected Outcomes Short Term Goal: Understand basic principles of dietary content, such as calories, fat, sodium, cholesterol and nutrients.;Long Term Goal: Adherence to prescribed nutrition plan.          Nutrition Assessments:  MEDIFICTS Score Key: >=70 Need to make dietary changes  40-70 Heart Healthy Diet <= 40 Therapeutic Level Cholesterol Diet   Picture Your Plate Scores: <59 Unhealthy dietary pattern with much room for improvement. 41-50 Dietary pattern unlikely to meet recommendations for good health and room for improvement. 51-60 More healthful dietary pattern, with some room for improvement.  >60 Healthy dietary pattern, although there may be some specific behaviors that could be improved.    Nutrition Goals Re-Evaluation:  Nutrition Goals Re-Evaluation     Row Name 09/21/23 1223             Goals   Current Weight 199 lb 8.3 oz (90.5 kg)       Comment LDL 62, Cr 1.32, GFR 55       Expected Outcome Joseph Soto has medical history of CAD s/p CABG, hyperlipidemia, ischemic cardiomyopathy, unstable angina, COPD2. LDL is well controlled on zetia , statin. He does follow a regular diet. Patient will benefit from participation in pulmonary rehab for nutrition education, exercise, and lifestyle modification.          Nutrition Goals Discharge (Final Nutrition Goals Re-Evaluation):  Nutrition Goals Re-Evaluation - 09/21/23 1223       Goals   Current Weight 199 lb 8.3 oz (90.5 kg)    Comment LDL 62, Cr 1.32, GFR 55    Expected Outcome Joseph Soto has medical history of CAD s/p CABG, hyperlipidemia, ischemic cardiomyopathy, unstable angina, COPD2. LDL is well controlled on zetia , statin. He does follow a regular diet. Patient will benefit from participation in pulmonary rehab for nutrition education, exercise, and lifestyle modification.          Psychosocial: Target Goals: Acknowledge  presence or absence of significant depression and/or stress, maximize coping skills, provide positive support system. Participant is able to verbalize types and ability to use techniques and skills needed for reducing stress and depression.  Initial Review & Psychosocial Screening:  Initial Psych Review & Screening - 09/13/23 1058       Initial Review   Current issues with None Identified      Family Dynamics   Good Support System? Yes      Barriers   Psychosocial barriers to participate in program There are no identifiable barriers or psychosocial needs.      Screening Interventions   Interventions Encouraged to exercise    Expected Outcomes Short Term goal: Identification and review with participant of any Quality of Life or Depression concerns found by scoring the questionnaire.;Long Term goal: The participant improves quality of Life and PHQ9 Scores as seen by post scores and/or verbalization of changes          Quality of Life Scores:  Scores of 19 and below usually indicate a poorer quality of life in these areas.  A difference of  2-3 points is a clinically meaningful difference.  A difference of 2-3 points in the total score of the Quality of Life Index has been associated with significant improvement in overall quality of life, self-image, physical symptoms, and general health in studies assessing change in quality of life.  PHQ-9: Review Flowsheet       09/13/2023  Depression screen PHQ 2/9  Decreased Interest 0  Down, Depressed, Hopeless 0  PHQ - 2 Score 0  Altered sleeping 0  Tired, decreased energy 3  Change in appetite 0  Feeling bad or failure about yourself  0  Trouble concentrating 1  Moving slowly or fidgety/restless 0  Suicidal thoughts 0  PHQ-9 Score 4  Difficult doing work/chores Not difficult at all   Interpretation of Total Score  Total Score Depression Severity:  1-4 = Minimal depression, 5-9 = Mild depression, 10-14 = Moderate depression, 15-19 =  Moderately severe depression, 20-27 = Severe depression   Psychosocial Evaluation and Intervention:  Psychosocial Evaluation - 09/13/23 1131       Psychosocial Evaluation & Interventions   Interventions Encouraged to exercise with the program and follow exercise prescription    Comments Joseph Soto's initial PHQ 2-9 score 0/4. Joseph Soto denies any psychosocial barriers or concerns. No needs at this time.    Expected Outcomes For Joseph Soto to participate in PR free of any psychosocial barriers or concerns.    Continue Psychosocial Services  No Follow up required          Psychosocial Re-Evaluation:  Psychosocial Re-Evaluation     Row Name 09/14/23 0829 10/05/23 0825 11/01/23 1124         Psychosocial Re-Evaluation   Current issues with None Identified None Identified None Identified     Comments Monthly psychosocial re-evaulation is as follows: Joseph Soto denied any psy/soc barriers or concerns at orientation. He declined any additional resources or referrals. He is scheduled to start the program next week Joseph Soto continues to deny any psychosocial barriers or concerns at this time. Monthly psychosocial re-evaluation as follows: Joseph Soto continues to deny any psy/soc barriers at this time. States he has good support from his wife and family.     Expected Outcomes For Joseph Soto to attend Pulm Rehab without any psy/soc concerns or barriers For Joseph Soto to attend Pulm Rehab without any psy/soc concerns or barriers For Joseph Soto to attend Pulm Rehab without any psy/soc concerns or barriers     Interventions Encouraged to attend Pulmonary Rehabilitation for the exercise Encouraged to attend Pulmonary Rehabilitation for the exercise Encouraged to attend Pulmonary Rehabilitation for the exercise     Continue Psychosocial Services  No Follow up required No Follow up required No Follow up required        Psychosocial Discharge (Final Psychosocial Re-Evaluation):  Psychosocial Re-Evaluation - 11/01/23 1124       Psychosocial Re-Evaluation    Current issues with None Identified    Comments Monthly psychosocial re-evaluation as follows: Joseph Soto continues to deny any psy/soc barriers at this time. States he has good support from his wife and family.    Expected Outcomes For Joseph Soto to attend Pulm Rehab without any psy/soc concerns or barriers    Interventions Encouraged to attend Pulmonary Rehabilitation for the exercise    Continue Psychosocial Services  No Follow up required          Education: Education Goals: Education classes will be provided on a weekly basis, covering required topics. Participant will state understanding/return demonstration of topics presented.  Learning Barriers/Preferences:  Learning Barriers/Preferences - 09/13/23 1058       Learning Barriers/Preferences   Learning Barriers Hearing   wears hearing aids   Learning Preferences None          Education Topics: Know Your Numbers Group instruction that is supported by a PowerPoint presentation. Instructor discusses importance of knowing and understanding resting, exercise, and post-exercise oxygen  saturation, heart rate, and blood pressure. Oxygen  saturation, heart rate, blood pressure, rating of perceived exertion, and dyspnea are  reviewed along with a normal range for these values.  Flowsheet Row PULMONARY REHAB CHRONIC OBSTRUCTIVE PULMONARY DISEASE from 10/28/2023 in [facility] for Heart, Vascular, & Lung Health  Date 10/28/23  Educator EP  Instruction Review Code 1- Verbalizes Understanding    Exercise for the Pulmonary Patient Group instruction that is supported by a PowerPoint presentation. Instructor discusses benefits of exercise, core components of exercise, frequency, duration, and intensity of an exercise routine, importance of utilizing pulse oximetry during exercise, safety while exercising, and options of places to exercise outside of rehab.    MET Level  Group instruction provided by PowerPoint, verbal discussion,  and written material to support subject matter. Instructor reviews what METs are and how to increase METs.  Flowsheet Row PULMONARY REHAB CHRONIC OBSTRUCTIVE PULMONARY DISEASE from 09/23/2023 in [facility] for Heart, Vascular, & Lung Health  Date 09/23/23  Educator EP  Instruction Review Code 1- Verbalizes Understanding    Pulmonary Medications Verbally interactive group education provided by instructor with focus on inhaled medications and proper administration. Flowsheet Row PULMONARY REHAB CHRONIC OBSTRUCTIVE PULMONARY DISEASE from 10/14/2023 in [facility] for Heart, Vascular, & Lung Health  Date 10/14/23  Educator RT  Instruction Review Code 1- Verbalizes Understanding    Anatomy and Physiology of the Respiratory System Group instruction provided by PowerPoint, verbal discussion, and written material to support subject matter. Instructor reviews respiratory cycle and anatomical components of the respiratory system and their functions. Instructor also reviews differences in obstructive and restrictive respiratory diseases with examples of each.  Flowsheet Row PULMONARY REHAB CHRONIC OBSTRUCTIVE PULMONARY DISEASE from 10/07/2023 in [facility] for Heart, Vascular, & Lung Health  Date 10/07/23  Educator RT  Instruction Review Code 1- Verbalizes Understanding    Oxygen  Safety Group instruction provided by PowerPoint, verbal discussion, and written material to support subject matter. There is an overview of "What is Oxygen " and "Why do we need it".  Instructor also reviews how to create a safe environment for oxygen  use, the importance of using oxygen  as prescribed, and the risks of noncompliance. There is a brief discussion on traveling with oxygen  and resources the patient may utilize. Flowsheet Row PULMONARY REHAB CHRONIC OBSTRUCTIVE PULMONARY DISEASE from 11/04/2023 in [facility] for Heart,  Vascular, & Lung Health  Date 11/04/23  Educator RN  Instruction Review Code 1- Verbalizes Understanding    Oxygen  Use Group instruction provided by PowerPoint, verbal discussion, and written material to discuss how supplemental oxygen  is prescribed and different types of oxygen  supply systems. Resources for more information are provided.    Breathing Techniques Group instruction that is supported by demonstration and informational handouts. Instructor discusses the benefits of pursed lip and diaphragmatic breathing and detailed demonstration on how to perform both.     Risk Factor Reduction Group instruction that is supported by a PowerPoint presentation. Instructor discusses the definition of a risk factor, different risk factors for pulmonary disease, and how the heart and lungs work together.   Pulmonary Diseases Group instruction provided by PowerPoint, verbal discussion, and written material to support subject matter. Instructor gives an overview of the different type of pulmonary diseases. There is also a discussion on risk factors and symptoms as well as ways to manage the diseases.   Stress and Energy Conservation Group instruction provided by PowerPoint, verbal discussion, and written material to support subject matter. Instructor gives an overview of stress and the impact it  can have on the body. Instructor also reviews ways to reduce stress. There is also a discussion on energy conservation and ways to conserve energy throughout the day.   Warning Signs and Symptoms Group instruction provided by PowerPoint, verbal discussion, and written material to support subject matter. Instructor reviews warning signs and symptoms of stroke, heart attack, cold and flu. Instructor also reviews ways to prevent the spread of infection.   Other Education Group or individual verbal, written, or video instructions that support the educational goals of the pulmonary rehab program.     Knowledge Questionnaire Score:  Knowledge Questionnaire Score - 09/13/23 1041       Knowledge Questionnaire Score   Pre Score 11/18          Core Components/Risk Factors/Patient Goals at Admission:  Personal Goals and Risk Factors at Admission - 09/13/23 1059       Core Components/Risk Factors/Patient Goals on Admission    Weight Management Yes;Weight Loss    Intervention Weight Management: Develop a combined nutrition and exercise program designed to reach desired caloric intake, while maintaining appropriate intake of nutrient and fiber, sodium and fats, and appropriate energy expenditure required for the weight goal.;Weight Management: Provide education and appropriate resources to help participant work on and attain dietary goals.;Weight Management/Obesity: Establish reasonable short term and long term weight goals.;Obesity: Provide education and appropriate resources to help participant work on and attain dietary goals.    Admit Weight 201 lb 4.5 oz (91.3 kg)    Expected Outcomes Short Term: Continue to assess and modify interventions until short term weight is achieved;Long Term: Adherence to nutrition and physical activity/exercise program aimed toward attainment of established weight goal;Weight Loss: Understanding of general recommendations for a balanced deficit meal plan, which promotes 1-2 lb weight loss per week and includes a negative energy balance of [phone] kcal/d;Understanding recommendations for meals to include 15-35% energy as protein, 25-35% energy from fat, 35-60% energy from carbohydrates, less than 200mg  of dietary cholesterol, 20-35 gm of total fiber daily;Understanding of distribution of calorie intake throughout the day with the consumption of 4-5 meals/snacks    Improve shortness of breath with ADL's Yes    Intervention Provide education, individualized exercise plan and daily activity instruction to help decrease symptoms of SOB with activities of daily living.     Expected Outcomes Short Term: Improve cardiorespiratory fitness to achieve a reduction of symptoms when performing ADLs;Long Term: Be able to perform more ADLs without symptoms or delay the onset of symptoms          Core Components/Risk Factors/Patient Goals Review:   Goals and Risk Factor Review     Row Name 09/14/23 0834 10/05/23 0826 11/01/23 1125         Core Components/Risk Factors/Patient Goals Review   Personal Goals Review Weight Management/Obesity;Improve shortness of breath with ADL's;Develop more efficient breathing techniques such as purse lipped breathing and diaphragmatic breathing and practicing self-pacing with activity. Weight Management/Obesity;Improve shortness of breath with ADL's;Develop more efficient breathing techniques such as purse lipped breathing and diaphragmatic breathing and practicing self-pacing with activity. Weight Management/Obesity;Improve shortness of breath with ADL's;Develop more efficient breathing techniques such as purse lipped breathing and diaphragmatic breathing and practicing self-pacing with activity.     Review Monthly re-evaluation of Core Components/Risk Factors/Patient Goals are as follows: Unable to assess Joseph Soto's goals yet. He is scheduled to start to the program next week Monthly re-evaluation of Core Components/Risk Factors/Patient Goals are as follows: Goal progressing for weight loss. Joseph Soto has  met with the dietitician and is applying her recommendations to meet his weight loss goals. Goal progressing for improving shortness of breath with ADL's. Joseph Soto is currently exercising on RA to keep sats >88%. Goal progressing for developing more efficient breathing techniques such as purse lipped breathing and diaphragmatic breathing; and practicing self-pacing with activity. We will continue to monitor Joseph Soto's progress throughout the program. Monthly review of patient's Core Components/Risk Factors/Patient Goals are as follows: Goal progressing for weight  loss. Joseph Soto has met with the dietitian and is applying her recommendations to meet his weight loss goals. He has maintained his weight since starting the program. Goal progressing for improving shortness of breath with ADL's. Joseph Soto is currently exercising on RA to keep sats >88%. He has been able to increase his speed and incline on the treadmill and resistance, speed, and revolutions per minute on the bike. Goal met for developing more efficient breathing techniques such as purse lipped breathing and diaphragmatic breathing; and practicing self-pacing with activity. Joseph Soto can perform purse lipped breathing while short of breath. He demonstrated this while performing the warmup and exercising. He can initiate PLB on his own. He works on diaphragmatic breathing at home. We will continue to monitor Joseph Soto's progress throughout the program.     Expected Outcomes Pt will show progress toward meeting expected goals and outcomes. To improve shortness of breath with ADL's, lose weight and develop more efficient breathing techniques such as purse lipped breathing and diaphragmatic breathing; and practicing self-pacing with activity. Pt will show progress toward meeting expected goals and outcomes.        Core Components/Risk Factors/Patient Goals at Discharge (Final Review):   Goals and Risk Factor Review - 11/01/23 1125       Core Components/Risk Factors/Patient Goals Review   Personal Goals Review Weight Management/Obesity;Improve shortness of breath with ADL's;Develop more efficient breathing techniques such as purse lipped breathing and diaphragmatic breathing and practicing self-pacing with activity.    Review Monthly review of patient's Core Components/Risk Factors/Patient Goals are as follows: Goal progressing for weight loss. Joseph Soto has met with the dietitian and is applying her recommendations to meet his weight loss goals. He has maintained his weight since starting the program. Goal progressing for improving  shortness of breath with ADL's. Joseph Soto is currently exercising on RA to keep sats >88%. He has been able to increase his speed and incline on the treadmill and resistance, speed, and revolutions per minute on the bike. Goal met for developing more efficient breathing techniques such as purse lipped breathing and diaphragmatic breathing; and practicing self-pacing with activity. Joseph Soto can perform purse lipped breathing while short of breath. He demonstrated this while performing the warmup and exercising. He can initiate PLB on his own. He works on diaphragmatic breathing at home. We will continue to monitor Joseph Soto's progress throughout the program.    Expected Outcomes Pt will show progress toward meeting expected goals and outcomes.          ITP Comments: Pt is making expected progress toward Pulmonary Rehab goals after completing 13 session(s). Recommend continued exercise, life style modification, education, and utilization of breathing techniques to increase stamina and strength, while also decreasing shortness of breath with exertion.  Dr. Slater Staff is Medical Director for Pulmonary Rehab at [facility].

## 2023-11-11 ENCOUNTER — Encounter (HOSPITAL_COMMUNITY)
Admission: RE | Admit: 2023-11-11 | Discharge: 2023-11-11 | Disposition: A | Discharge status: Home / Self Care | Source: Ambulatory Visit | Attending: Internal Medicine | Admitting: Internal Medicine

## 2023-11-11 DIAGNOSIS — J449 Chronic obstructive pulmonary disease, unspecified: Secondary | ICD-10-CM | POA: Diagnosis not present

## 2023-11-11 NOTE — Progress Notes (Signed)
 Daily Session Note  Patient Details  Name: Joseph Soto MRN: [id number] Date of Birth: 04-Feb-1946 Referring Provider:   [name] Pulmonary Rehab Walk Test from 09/13/2023 in [facility] for Heart, Vascular, & Lung Health  Referring Provider [name]    Encounter Date: 11/11/2023  Check In:  Session Check In - 11/11/23 1036       Check-In   Supervising physician immediately available to respond to emergencies [facility] MD immediately available    Physician(s) Jackee Alberts, NP    Location [facility]    Staff Present Augustin Sharps, Candia Levin, RN, Maud Moats, MS, ACSM-CEP, Exercise Physiologist;Jetta Walker BS, ACSM-CEP, Exercise Physiologist    Virtual Visit No    Medication changes reported     No    Fall or balance concerns reported    No    Tobacco Cessation No Change    Warm-up and Cool-down Performed as group-led instruction    Resistance Training Performed Yes    VAD Patient? No    PAD/SET Patient? No      Pain Assessment   Currently in Pain? No/denies    Multiple Pain Sites No          Capillary Blood Glucose: No results found for this or any previous visit (from the past 24 hours).    Social History   Tobacco Use  Smoking Status Former   Current packs/day: 0.00   Average packs/day: 2.0 packs/day for 30.0 years (60.0 ttl pk-yrs)   Types: Cigarettes   Start date: 05/17/1970   Quit date: 05/16/2000   Years since quitting: 23.5  Smokeless Tobacco Never  Tobacco Comments   quit 06/2005    Goals Met:  Proper associated with RPD/PD & O2 Sat Independence with exercise equipment Exercise tolerated well No report of concerns or symptoms today Strength training completed today  Goals Unmet:  Not Applicable  Comments: Service time is from 1004 to 1150.    Dr. Slater Staff is Medical Director for Pulmonary Rehab at [facility].

## 2023-11-16 ENCOUNTER — Encounter (HOSPITAL_COMMUNITY)
Admission: RE | Admit: 2023-11-16 | Discharge: 2023-11-16 | Disposition: A | Source: Ambulatory Visit | Attending: Internal Medicine

## 2023-11-16 DIAGNOSIS — J449 Chronic obstructive pulmonary disease, unspecified: Secondary | ICD-10-CM

## 2023-11-16 NOTE — Progress Notes (Signed)
 Daily Session Note  Patient Details  Name: Joseph Soto MRN: [id number] Date of Birth: 27-Mar-1945 Referring Provider:   [name] Pulmonary Rehab Walk Test from 09/13/2023 in [facility] for Heart, Vascular, & Lung Health  Referring Provider [name]    Encounter Date: 11/16/2023  Check In:  Session Check In - 11/16/23 1123       Check-In   Supervising physician immediately available to respond to emergencies [facility] MD immediately available    Physician(s) Barnie Hila, NP    Location [facility]    Staff Present Augustin Sharps, Candia Levin, RN, Maud Moats, MS, ACSM-CEP, Exercise Physiologist;Joseph Lennon, RN, BSN    Virtual Visit No    Medication changes reported     No    Fall or balance concerns reported    No    Tobacco Cessation No Change    Warm-up and Cool-down Performed as group-led instruction    Resistance Training Performed Yes    VAD Patient? No    PAD/SET Patient? No      Pain Assessment   Currently in Pain? No/denies    Multiple Pain Sites No          Capillary Blood Glucose: No results found for this or any previous visit (from the past 24 hours).    Social History   Tobacco Use  Smoking Status Former   Current packs/day: 0.00   Average packs/day: 2.0 packs/day for 30.0 years (60.0 ttl pk-yrs)   Types: Cigarettes   Start date: 05/17/1970   Quit date: 05/16/2000   Years since quitting: 23.5  Smokeless Tobacco Never  Tobacco Comments   quit 06/2005    Goals Met:  Proper associated with RPD/PD & O2 Sat Independence with exercise equipment Exercise tolerated well No report of concerns or symptoms today Strength training completed today  Goals Unmet:  Not Applicable  Comments: Service time is from 1012 to 1138.    Dr. Slater Staff is Medical Director for Pulmonary Rehab at [facility].

## 2023-11-18 ENCOUNTER — Encounter (HOSPITAL_COMMUNITY)
Admission: RE | Admit: 2023-11-18 | Discharge: 2023-11-18 | Disposition: A | Discharge status: Home / Self Care | Source: Ambulatory Visit | Attending: Internal Medicine | Admitting: Internal Medicine

## 2023-11-18 DIAGNOSIS — J449 Chronic obstructive pulmonary disease, unspecified: Secondary | ICD-10-CM | POA: Diagnosis not present

## 2023-11-18 NOTE — Progress Notes (Signed)
 Daily Session Note  Patient Details  Name: Joseph Soto MRN: [id number] Date of Birth: [birth year] Referring Provider:   [name] Pulmonary Rehab Walk Test from 09/13/2023 in [facility]  Referring Provider [name]    Encounter Date: 11/18/2023  Check In:  Session Check In - 11/18/23 1204       Check-In   Supervising physician immediately available to respond to emergencies [facility] MD immediately available    Physician(s) Josefa Beauvais, NP    Location [facility]    Staff Present Augustin Sharps, Candia Levin, RN, Maud Moats, MS, ACSM-CEP, Exercise Physiologist;Randi Midge BS, ACSM-CEP, Exercise Physiologist    Virtual Visit No    Medication changes reported     No    Fall or balance concerns reported    No    Tobacco Cessation No Change    Warm-up and Cool-down Performed as group-led instruction    Resistance Training Performed Yes    VAD Patient? No    PAD/SET Patient? No      Pain Assessment   Currently in Pain? No/denies    Multiple Pain Sites No          Capillary Blood Glucose: No results found for this or any previous visit (from the past 24 hours).    Social History   Tobacco Use  Smoking Status Former   Current packs/day: 0.00   Average packs/day: 2.0 packs/day for 30.0 years (60.0 ttl pk-yrs)   Types: Cigarettes   Start date: 05/17/1970   Quit date: 05/16/2000   Years since quitting: 23.5  Smokeless Tobacco Never  Tobacco Comments   quit 06/2005    Goals Met:  Proper associated with RPD/PD & O2 Sat Exercise tolerated well No report of concerns or symptoms today Strength training completed today  Goals Unmet:  Not Applicable  Comments: Service time is from 1003 to 1149.    Dr. Slater Staff is Medical Director for Pulmonary Rehab at [facility].

## 2023-11-19 NOTE — Telephone Encounter (Signed)
 Hi. Dr. Verlin, previous heart monitor obtained in July showed some transient NSVT, for which I increased the metoprolol  succinate to twice a day to offer more ventricular ectopy suppression. I was not aware of ventricular fibrillation. Joseph Soto was asking the possibility of defibrillator in the house or have ICD. 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 He was referred to CT surgery for consideration of redo CABG however declined the surgery. I am not confident the benefit of have a portable defibrillator in house in cardiac patients. As for his transient VT, as mentioned, I increased his metoprolol  succinate to 25mg  BID. His EF did drop slightly to 45-50% on last echo, but not severe enough for ICD. Should I repeat a heart monitor in this case? If still have NSVT, consider amio? Mayan Dolney

## 2023-11-23 ENCOUNTER — Encounter (HOSPITAL_COMMUNITY)
Admission: RE | Admit: 2023-11-23 | Discharge: 2023-11-23 | Disposition: A | Source: Ambulatory Visit | Attending: Internal Medicine

## 2023-11-23 VITALS — Wt 203.3 lb

## 2023-11-23 DIAGNOSIS — J449 Chronic obstructive pulmonary disease, unspecified: Secondary | ICD-10-CM | POA: Diagnosis not present

## 2023-11-23 NOTE — Progress Notes (Signed)
 Daily Session Note  Patient Details  Name: Joseph Soto MRN: [id number] Date of Birth: Dec 21, 1945 Referring Provider:   [name] Pulmonary Rehab Walk Test from 09/13/2023 in [facility], [facility], & [facility]  Referring Provider [name]    Encounter Date: 11/23/2023  Check In:  Session Check In - 11/23/23 1125       Check-In   Supervising physician immediately available to respond to emergencies [facility] MD immediately available    Physician(s) Orren Fabry, PA    [facility]    Staff Present Augustin Sharps, Candia Levin, RN, Maud Moats, MS, ACSM-CEP, Exercise Physiologist;Randi Midge BS, ACSM-CEP, Exercise Physiologist;Johnny Fayette, MS, Exercise Physiologist    Virtual Visit No    Medication changes reported     No    Fall or balance concerns reported    No    Tobacco Cessation No Change    Warm-up and Cool-down Performed as group-led instruction    Resistance Training Performed Yes    VAD Patient? No    PAD/SET Patient? No      Pain Assessment   Currently in Pain? No/denies    Multiple Pain Sites No          Capillary Blood Glucose: No results found for this or any previous visit (from the past 24 hours).   Exercise Prescription Changes - 11/23/23 1100       Response to Exercise   Blood Pressure (Admit) 112/60    Blood Pressure (Exercise) 122/60    Blood Pressure (Exit) 110/60    Heart Rate (Admit) 82 bpm    Heart Rate (Exercise) 97 bpm    Heart Rate (Exit) 83 bpm    Oxygen  Saturation (Admit) 97 %    Oxygen  Saturation (Exercise) 92 %    Oxygen  Saturation (Exit) 98 %    Rating of Perceived Exertion (Exercise) 11    Perceived Dyspnea (Exercise) 1    Duration Continue with 30 min of aerobic exercise without signs/symptoms of physical distress.    Intensity THRR unchanged      Progression   Progression Continue to progress workloads to maintain intensity without signs/symptoms of physical  distress.      Resistance Training   Training Prescription Yes    Weight black bands    Reps 10-15    Time 10 Minutes      Treadmill   MPH 2.4    Grade 2    Minutes 15    METs 3.4      Bike   Level 3.5    Minutes 15    METs 3          Social History   Tobacco Use  Smoking Status Former   Current packs/day: 0.00   Average packs/day: 2.0 packs/day for 30.0 years (60.0 ttl pk-yrs)   Types: Cigarettes   Start date: 05/17/1970   Quit date: 05/16/2000   Years since quitting: 23.5  Smokeless Tobacco Never  Tobacco Comments   quit 06/2005    Goals Met:  Proper associated with RPD/PD & O2 Sat Independence with exercise equipment Exercise tolerated well No report of concerns or symptoms today Strength training completed today  Goals Unmet:  Not Applicable  Comments: Service time is from 1016 to 1132.    Dr. Slater Staff is Medical Director for Pulmonary Rehab at [facility].

## 2023-11-24 ENCOUNTER — Other Ambulatory Visit: Payer: Self-pay | Admitting: Physician Assistant

## 2023-11-24 ENCOUNTER — Ambulatory Visit: Attending: Physician Assistant

## 2023-11-24 DIAGNOSIS — I4729 Other ventricular tachycardia: Secondary | ICD-10-CM

## 2023-11-24 NOTE — Telephone Encounter (Signed)
 I have called the patient, he is agreeable to wear a 2 week ZIO. Please mail the patient a ZIO XT montior for NSVT. During the discussion, patient says he was at pulmonary rehab last Thursday or the week prior when he had some irregular rhythm, I do not see any documentation on this or EKG. Kaylee, have you seen any EKG strips on this patient?

## 2023-11-24 NOTE — Progress Notes (Unsigned)
Enrolled for [employer] to mail a ZIO XT long term holter monitor to the patients address on file.   Dr. McAlhany to read. 

## 2023-11-25 ENCOUNTER — Encounter (HOSPITAL_COMMUNITY)
Admission: RE | Admit: 2023-11-25 | Discharge: 2023-11-25 | Disposition: A | Discharge status: Home / Self Care | Source: Ambulatory Visit | Attending: Internal Medicine | Admitting: Internal Medicine

## 2023-11-25 DIAGNOSIS — J449 Chronic obstructive pulmonary disease, unspecified: Secondary | ICD-10-CM | POA: Diagnosis not present

## 2023-11-25 NOTE — Progress Notes (Signed)
 Daily Session Note  Patient Details  Name: Joseph Soto MRN: [id number] Date of Birth: July 16, 1945 Referring Provider:   [name] Pulmonary Rehab Walk Test from 09/13/2023 in [facility], Vascular, & [facility]  Referring Provider [name]    Encounter Date: 11/25/2023  Check In:  Session Check In - 11/25/23 1036       Check-In   Supervising physician immediately available to respond to emergencies [facility] immediately available    Physician(s) Katlin West, NP    [facility]    Staff Present Augustin Sharps, Candia Levin, RN, Maud Moats, MS, ACSM-CEP, Exercise Physiologist;Randi Midge BS, ACSM-CEP, Exercise Physiologist    Virtual Visit No    Medication changes reported     No    Fall or balance concerns reported    No    Tobacco Cessation No Change    Warm-up and Cool-down Performed as group-led instruction    Resistance Training Performed Yes    VAD Patient? No    PAD/SET Patient? No      Pain Assessment   Currently in Pain? No/denies          Capillary Blood Glucose: No results found for this or any previous visit (from the past 24 hours).    Social History   Tobacco Use  Smoking Status Former   Current packs/day: 0.00   Average packs/day: 2.0 packs/day for 30.0 years (60.0 ttl pk-yrs)   Types: Cigarettes   Start date: 05/17/1970   Quit date: 05/16/2000   Years since quitting: 23.5  Smokeless Tobacco Never  Tobacco Comments   quit 06/2005    Goals Met:  Proper associated with RPD/PD & O2 Sat Independence with exercise equipment Exercise tolerated well No report of concerns or symptoms today  Goals Unmet:  Not Applicable  Comments: Service time is from 1024 to 1119.    Dr. Slater Staff is Medical Director for Pulmonary Rehab at [facility].

## 2023-11-30 ENCOUNTER — Encounter (HOSPITAL_COMMUNITY)
Admission: RE | Admit: 2023-11-30 | Discharge: 2023-11-30 | Disposition: A | Source: Ambulatory Visit | Attending: Internal Medicine

## 2023-11-30 DIAGNOSIS — J449 Chronic obstructive pulmonary disease, unspecified: Secondary | ICD-10-CM | POA: Diagnosis not present

## 2023-11-30 NOTE — Progress Notes (Signed)
 Daily Session Note  Patient Details  Name: Joseph Soto MRN: [id number] Date of Birth: 1946/02/06 Referring Provider:   [name] Pulmonary Rehab Walk Test from 09/13/2023 in [facility], [facility], & [facility]  Referring Provider [name]    Encounter Date: 11/30/2023  Check In:  Session Check In - 11/30/23 1153       Check-In   Supervising physician immediately available to respond to emergencies [facility] immediately available    Physician(s) Katlin West, NP    [facility]    Staff Present Augustin Sharps, Candia Levin, RN, BSN;Randi [employer], ACSM-CEP, Exercise Physiologist;Olinty Valere, MS, ACSM-CEP, Exercise Physiologist    Virtual Visit No    Medication changes reported     No    Fall or balance concerns reported    No    Tobacco Cessation No Change    Warm-up and Cool-down Performed as group-led instruction    Resistance Training Performed Yes    VAD Patient? No    PAD/SET Patient? No      Pain Assessment   Currently in Pain? No/denies    Multiple Pain Sites No          Capillary Blood Glucose: No results found for this or any previous visit (from the past 24 hours).    Social History   Tobacco Use  Smoking Status Former   Current packs/day: 0.00   Average packs/day: 2.0 packs/day for 30.0 years (60.0 ttl pk-yrs)   Types: Cigarettes   Start date: 05/17/1970   Quit date: 05/16/2000   Years since quitting: 23.5  Smokeless Tobacco Never  Tobacco Comments   quit 06/2005    Goals Met:  Independence with exercise equipment Exercise tolerated well No report of concerns or symptoms today Strength training completed today  Goals Unmet:  Not Applicable  Comments: Service time is from 1017 to 1143    Dr. Slater Staff is Medical Director for Pulmonary Rehab at [facility].

## 2023-12-02 ENCOUNTER — Encounter (HOSPITAL_COMMUNITY)
Admission: RE | Admit: 2023-12-02 | Discharge: 2023-12-02 | Disposition: A | Source: Ambulatory Visit | Attending: Internal Medicine

## 2023-12-02 DIAGNOSIS — J449 Chronic obstructive pulmonary disease, unspecified: Secondary | ICD-10-CM | POA: Diagnosis not present

## 2023-12-02 NOTE — Progress Notes (Signed)
 Daily Session Note  Patient Details  Name: Joseph Soto MRN: [id number] Date of Birth: Jan 28, 1946 Referring Provider:   [name] Pulmonary Rehab Walk Test from 09/13/2023 in [facility], Vascular, & Lung Health  Referring Provider [name]    Encounter Date: 12/02/2023  Check In:  Session Check In - 12/02/23 1201       Check-In   Supervising physician immediately available to respond to emergencies [facility] immediately available    Physician(s) Damien Braver, NP    [facility]    Staff Present Augustin Sharps, Candia Levin, RN, BSN;Randi Reeve BS, ACSM-CEP, Exercise Physiologist;Kyelle Urbas Nicholaus, MS, ACSM-CEP, Exercise Physiologist    Virtual Visit No    Medication changes reported     No    Fall or balance concerns reported    No    Tobacco Cessation No Change    Warm-up and Cool-down Performed as group-led instruction    Resistance Training Performed Yes    VAD Patient? No    PAD/SET Patient? No      Pain Assessment   Currently in Pain? No/denies    Multiple Pain Sites No          Capillary Blood Glucose: No results found for this or any previous visit (from the past 24 hours).    Social History   Tobacco Use  Smoking Status Former   Current packs/day: 0.00   Average packs/day: 2.0 packs/day for 30.0 years (60.0 ttl pk-yrs)   Types: Cigarettes   Start date: 05/17/1970   Quit date: 05/16/2000   Years since quitting: 23.5  Smokeless Tobacco Never  Tobacco Comments   quit 06/2005    Goals Met:  Proper associated with RPD/PD & O2 Sat Independence with exercise equipment Exercise tolerated well No report of concerns or symptoms today Strength training completed today  Goals Unmet:  Not Applicable  Comments: Service time is from 1013 to 1144.    Dr. Slater Staff is Medical Director for Pulmonary Rehab at [facility].

## 2023-12-06 ENCOUNTER — Telehealth: Payer: Self-pay | Admitting: Oncology

## 2023-12-06 DIAGNOSIS — J449 Chronic obstructive pulmonary disease, unspecified: Secondary | ICD-10-CM | POA: Diagnosis not present

## 2023-12-06 NOTE — Telephone Encounter (Signed)
 PT called to reschedule appt, has another appt at the same time

## 2023-12-07 ENCOUNTER — Encounter (HOSPITAL_COMMUNITY)
Admission: RE | Admit: 2023-12-07 | Discharge: 2023-12-07 | Disposition: A | Discharge status: Home / Self Care | Source: Ambulatory Visit | Attending: Internal Medicine | Admitting: Internal Medicine

## 2023-12-07 VITALS — Wt 203.0 lb

## 2023-12-07 DIAGNOSIS — J449 Chronic obstructive pulmonary disease, unspecified: Secondary | ICD-10-CM | POA: Diagnosis not present

## 2023-12-07 NOTE — Progress Notes (Signed)
 Daily Session Note  Patient Details  Name: Joseph Soto MRN: [id number] Date of Birth: [birth year] Referring Provider:   [name] Pulmonary Rehab Walk Test from 09/13/2023 in [facility], Vascular, & Lung [facility]  Referring Provider [name]    Encounter Date: 12/07/2023  Check In:  Session Check In - 12/07/23 1033       Check-In   Supervising physician immediately available to respond to emergencies [facility] immediately available    Physician(s) Lum Louis, NP    [facility]    Staff Present Augustin Sharps, Candia Levin, RN, BSN;Randi [employer], ACSM-CEP, Exercise Physiologist;Kaylee Nicholaus, MS, ACSM-CEP, Exercise Physiologist    Virtual Visit No    Medication changes reported     No    Fall or balance concerns reported    No    Tobacco Cessation No Change    Warm-up and Cool-down Performed as group-led instruction    Resistance Training Performed Yes    VAD Patient? No    PAD/SET Patient? No      Pain Assessment   Currently in Pain? No/denies          Capillary Blood Glucose: No results found for this or any previous visit (from the past 24 hours).   Exercise Prescription Changes - 12/07/23 1100       Response to Exercise   Blood Pressure (Admit) 110/60    Blood Pressure (Exercise) 120/58    Blood Pressure (Exit) 108/60    Heart Rate (Admit) 78 bpm    Heart Rate (Exercise) 74 bpm    Heart Rate (Exit) 80 bpm    Oxygen  Saturation (Admit) 97 %    Oxygen  Saturation (Exercise) 94 %    Oxygen  Saturation (Exit) 96 %    Rating of Perceived Exertion (Exercise) 9    Perceived Dyspnea (Exercise) 1    Duration Continue with 30 min of aerobic exercise without signs/symptoms of physical distress.    Intensity THRR unchanged      Progression   Progression Continue to progress workloads to maintain intensity without signs/symptoms of physical distress.      Resistance Training   Training Prescription Yes     Weight black bands    Reps 10-15    Time 10 Minutes      Treadmill   MPH 2.4    Grade 2    Minutes 15    METs 3.4      Bike   Level 3.5    Minutes 15    METs 3.1          Social History   Tobacco Use  Smoking Status Former   Current packs/day: 0.00   Average packs/day: 2.0 packs/day for 30.0 years (60.0 ttl pk-yrs)   Types: Cigarettes   Start date: 05/17/1970   Quit date: 05/16/2000   Years since quitting: 23.5  Smokeless Tobacco Never  Tobacco Comments   quit 06/2005    Goals Met:  Proper associated with RPD/PD & O2 Sat Independence with exercise equipment Exercise tolerated well No report of concerns or symptoms today Strength training completed today  Goals Unmet:  Not Applicable  Comments: Service time is from 1015 to 1140.    Dr. Slater Staff is Medical Director for Pulmonary Rehab at [facility].

## 2023-12-08 NOTE — Progress Notes (Signed)
 Pulmonary Individual Treatment Plan  Patient Details  Name: RAMEZ ARRONA MRN: [id number] Date of Birth: 04/25/1945 Referring Provider:   [name] Pulmonary Rehab Walk Test from 09/13/2023 in [facility]  Referring Provider [name]    Initial Encounter Date:  Flowsheet Row Pulmonary Rehab Walk Test from 09/13/2023 in [facility]  Date 09/13/23    Visit Diagnosis: Stage 2 moderate COPD by GOLD classification (HCC)  Patient's Home Medications on Admission:   Current Outpatient Medications:    acetaminophen  (TYLENOL ) 500 MG tablet, Take 500-1,000 mg by mouth every 6 (six) hours as needed (pain.)., Disp: , Rfl:    albuterol  (PROVENTIL ) (2.5 MG/3ML) 0.083% nebulizer solution, Take 3 mLs (2.5 mg total) by nebulization every 6 (six) hours as needed for wheezing or shortness of breath., Disp: 75 mL, Rfl: 12   albuterol  (VENTOLIN  HFA) 108 (90 Base) MCG/ACT inhaler, Inhale into the lungs every 6 (six) hours as needed for wheezing or shortness of breath., Disp: , Rfl:    amLODipine  (NORVASC ) 5 MG tablet, Take 5 mg by mouth at bedtime., Disp: , Rfl:    aspirin  EC 81 MG tablet, Take 81 mg by mouth at bedtime., Disp: , Rfl:    azelastine  (ASTELIN ) 0.1 % nasal spray, Place 1 spray into both nostrils 2 (two) times daily. Use in each nostril as directed, Disp: 30 mL, Rfl: 12   budesonide-glycopyrrolate -formoterol (BREZTRI  AEROSPHERE) 160-9-4.8 MCG/ACT AERO inhaler, Inhale 2 puffs into the lungs in the morning and at bedtime., Disp: 10.7 g, Rfl: 12   cholecalciferol  (VITAMIN D ) 1000 units tablet, Take 2,000 Units by mouth daily with breakfast., Disp: , Rfl:    clopidogrel  (PLAVIX ) 75 MG tablet, Take 1 tablet (75 mg total) by mouth daily., Disp: 90 tablet, Rfl: 3   Coenzyme Q10 300 MG CAPS, Take 300 mg by mouth daily., Disp: , Rfl:    cyclobenzaprine  (FLEXERIL ) 10 MG tablet, Take 10 mg by  mouth 2 (two) times daily., Disp: , Rfl:    doxycycline (VIBRA-TABS) 100 MG tablet, 1 tablet Orally twice a day for 10 days (Patient not taking: Reported on 10/21/2023), Disp: , Rfl:    ezetimibe  (ZETIA ) 10 MG tablet, TAKE 1 TABLET BY MOUTH EVERY DAY, Disp: 90 tablet, Rfl: 1   fluticasone  (FLONASE ) 50 MCG/ACT nasal spray, Place 1 spray into both nostrils daily as needed for rhinitis or allergies., Disp: , Rfl:    Fluticasone -Umeclidin-Vilant (TRELEGY ELLIPTA) 200-62.5-25 MCG/ACT AEPB, Inhale 1 puff into the lungs as needed., Disp: , Rfl:    isosorbide  mononitrate (IMDUR ) 30 MG 24 hr tablet, Take 1 tablet (30 mg total) by mouth daily. Please schedule yearly appointment for future refills. 1st attempt. Thank you, Disp: 45 tablet, Rfl: 0   metoprolol  succinate (TOPROL -XL) 25 MG 24 hr tablet, Take 1 tablet (25 mg total) by mouth in the morning and at bedtime., Disp: 180 tablet, Rfl: 3   mometasone (ELOCON) 0.1 % cream, Apply 1 Application topically as needed., Disp: , Rfl:    naltrexone  (DEPADE) 50 MG tablet, Take 50 mg by mouth 2 (two) times daily., Disp: , Rfl:    neomycin-polymyxin b-dexamethasone  (MAXITROL) 3.5-10000-0.1 SUSP, Place 1 drop into the left eye 3 (three) times daily., Disp: , Rfl:    nitroGLYCERIN  (NITROSTAT ) 0.4 MG SL tablet, Place 1 tablet (0.4 mg total) under the tongue every 5 (five) minutes as needed for chest pain., Disp: 25 tablet,  Rfl: 3   OHTUVAYRE  3 MG/2.5ML SUSP, Inhale 3 mg into the lungs 2 (two) times daily., Disp: , Rfl:    rosuvastatin  (CRESTOR ) 40 MG tablet, Take 1 tablet (40 mg total) by mouth daily., Disp: 90 tablet, Rfl: 0   Spacer/Aero-Holding Chambers (AEROCHAMBER MV) inhaler, Use as instructed, Disp: 1 each, Rfl: 0   traMADol  (ULTRAM ) 50 MG tablet, Take 50 mg by mouth every 12 (twelve) hours as needed for moderate pain (pain score 4-6)., Disp: , Rfl:   Past Medical History: Past Medical History:  Diagnosis Date   Allergic rhinitis    grass, dust   Buttock pain     CAD (coronary artery disease)    Last cath 2010 per Dr. 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 Arnita office)   Myeloma [facility])    found it in my chest when they did the excision   Myocardial infarction (HCC) 06/2005   Non Hodgkin's lymphoma (HCC) dx'd 06/2005    Tobacco Use: Social History   Tobacco Use  Smoking Status Former   Current packs/day: 0.00   Average packs/day: 2.0 packs/day for 30.0 years (60.0 ttl pk-yrs)   Types: Cigarettes   Start date: 05/17/1970   Quit date: 05/16/2000   Years since quitting: 23.5  Smokeless Tobacco Never  Tobacco Comments   quit 06/2005    Labs: Review Flowsheet  More data exists      Latest Ref Rng & Units 09/14/2011 11/30/2011 05/02/2012 12/23/2015 01/24/2018  Labs for ITP Cardiac and Pulmonary Rehab  Cholestrol 100 - 199 mg/dL 882  828  818  - 883   LDL (calc) 0 - 99 mg/dL 53  890  875  - 52   HDL-C >39 mg/dL 47.29  47.19  54.29  - 46   Trlycerides 0 - 149 mg/dL 41.9  53.9  40.9  - 90   TCO2 0 - 100 mmol/L - - - 22  -    Capillary Blood Glucose: Lab Results  Component Value Date   GLUCAP 97 01/24/2013   GLUCAP 86 01/24/2013   GLUCAP 89 01/24/2013     Pulmonary Assessment Scores:  Pulmonary Assessment Scores     Row Name 09/13/23 1127 11/23/23 1546 11/30/23 1215     ADL UCSD   ADL Phase Entry Exit Exit   SOB Score total 41 -- 13     CAT Score    CAT Score 21 8 8      mMRC Score   mMRC Score 2 -- --     UCSD: Self-administered rating of dyspnea associated with activities of daily living (ADLs) 6-point scale (0 = not at all to 5 = maximal or unable to do because of breathlessness)  Scoring Scores range from 0 to 120.  Minimally important difference is 5 units  CAT: CAT  can identify the health impairment of COPD patients and is better correlated with disease progression.  CAT has a scoring range of zero to 40. The CAT score is classified into four groups of low (less than 10), medium (10 - 20), high (21-30) and very high (31-40) based on the impact level of disease on health status. A CAT score over 10 suggests significant symptoms.  A worsening CAT score could be explained by an exacerbation, poor medication adherence, poor inhaler technique, or progression of COPD or comorbid conditions.  CAT MCID is 2 points  mMRC: mMRC (Modified Medical Research Council) Dyspnea Scale is used to assess the degree of baseline functional disability in patients of respiratory disease due to dyspnea. No minimal important difference is established. A decrease in score of 1 point or greater is considered a positive change.   Pulmonary Function Assessment:  Pulmonary Function Assessment - 09/13/23 1102       Breath   Bilateral Breath Sounds Clear    Shortness of Breath Yes;Fear of Shortness of Breath;Limiting activity          Exercise Target Goals: Exercise Program Goal: Individual exercise prescription set using results from initial 6 min walk test and THRR while considering  patient's activity barriers and safety.   Exercise Prescription Goal: Initial exercise prescription builds to 30-45 minutes a day of aerobic activity, 2-3 days per week.  Home exercise guidelines will be given to patient during program as part of exercise prescription that the participant will acknowledge.  Activity Barriers & Risk Stratification:  Activity  Barriers & Cardiac Risk Stratification - 09/13/23 1100       Activity Barriers & Cardiac Risk Stratification   Activity Barriers Deconditioning;Muscular Weakness;Shortness of Breath;Fibromyalgia;Balance Concerns    Cardiac Risk Stratification Moderate          6 Minute Walk:  6 Minute Walk     Row Name 09/13/23 1147         6 Minute Walk   Phase Initial     Distance 1021 feet     Walk Time 6 minutes     # of Rest Breaks 0     MPH 1.93     METS 1.81     RPE 12     Perceived Dyspnea  1     VO2 Peak 6.34     Symptoms No     Resting HR 85 bpm     Resting BP 126/60     Resting Oxygen  Saturation  95 %     Exercise Oxygen  Saturation  during 6 min walk 91 %     Max Ex. HR 89 bpm     Max Ex. BP 126/60     2 Minute Post BP 112/60       Interval HR   1 Minute HR 79     2 Minute HR 83     3 Minute HR 89     4 Minute HR 87     5 Minute HR 79     6 Minute HR 82     2 Minute Post HR 77     Interval Heart Rate? Yes       Interval Oxygen    Interval Oxygen ? Yes     Baseline Oxygen  Saturation % 95 %     1 Minute Oxygen  Saturation % 91 %     1 Minute Liters of Oxygen  0 L     2 Minute Oxygen  Saturation % 95 %  2 Minute Liters of Oxygen  0 L     3 Minute Oxygen  Saturation % 91 %     3 Minute Liters of Oxygen  0 L     4 Minute Oxygen  Saturation % 96 %     4 Minute Liters of Oxygen  0 L     5 Minute Oxygen  Saturation % 93 %     5 Minute Liters of Oxygen  0 L     6 Minute Oxygen  Saturation % 91 %     6 Minute Liters of Oxygen  0 L     2 Minute Post Oxygen  Saturation % 98 %     2 Minute Post Liters of Oxygen  0 L        Oxygen  Initial Assessment:  Oxygen  Initial Assessment - 09/13/23 1102       Home Oxygen    Home Oxygen  Device Home Concentrator    Sleep Oxygen  Prescription Continuous    Liters per minute 2.5    Home Exercise Oxygen  Prescription None    Home Resting Oxygen  Prescription None    Compliance with Home Oxygen  Use Yes      Initial 6 min Walk   Oxygen  Used  None      Program Oxygen  Prescription   Program Oxygen  Prescription None      Intervention   Short Term Goals To learn and exhibit compliance with exercise, home and travel O2 prescription;To learn and understand importance of monitoring SPO2 with pulse oximeter and demonstrate accurate use of the pulse oximeter.;To learn and understand importance of maintaining oxygen  saturations>88%;To learn and demonstrate proper pursed lip breathing techniques or other breathing techniques. ;To learn and demonstrate proper use of respiratory medications    Long  Term Goals Exhibits compliance with exercise, home  and travel O2 prescription;Maintenance of O2 saturations>88%;Compliance with respiratory medication;Verbalizes importance of monitoring SPO2 with pulse oximeter and return demonstration;Exhibits proper breathing techniques, such as pursed lip breathing or other method taught during program session;Demonstrates proper use of MDI's          Oxygen  Re-Evaluation:  Oxygen  Re-Evaluation     Row Name 09/13/23 1102 10/06/23 0901 11/01/23 0926 11/26/23 0920       Program Oxygen  Prescription   Program Oxygen  Prescription -- None None None      Home Oxygen    Home Oxygen  Device -- Home Concentrator Home Concentrator Home Concentrator    Sleep Oxygen  Prescription -- Continuous Continuous Continuous    Liters per minute -- 2.5 2.5 2.5    Home Exercise Oxygen  Prescription -- None None None    Home Resting Oxygen  Prescription -- None None None    Compliance with Home Oxygen  Use -- Yes Yes Yes      Goals/Expected Outcomes   Short Term Goals -- To learn and exhibit compliance with exercise, home and travel O2 prescription;To learn and understand importance of monitoring SPO2 with pulse oximeter and demonstrate accurate use of the pulse oximeter.;To learn and understand importance of maintaining oxygen  saturations>88%;To learn and demonstrate proper pursed lip breathing techniques or other breathing  techniques. ;To learn and demonstrate proper use of respiratory medications To learn and exhibit compliance with exercise, home and travel O2 prescription;To learn and understand importance of monitoring SPO2 with pulse oximeter and demonstrate accurate use of the pulse oximeter.;To learn and understand importance of maintaining oxygen  saturations>88%;To learn and demonstrate proper pursed lip breathing techniques or other breathing techniques. ;To learn and demonstrate proper use of respiratory medications To learn and exhibit compliance with exercise, home  and travel O2 prescription;To learn and understand importance of monitoring SPO2 with pulse oximeter and demonstrate accurate use of the pulse oximeter.;To learn and understand importance of maintaining oxygen  saturations>88%;To learn and demonstrate proper pursed lip breathing techniques or other breathing techniques. ;To learn and demonstrate proper use of respiratory medications    Long  Term Goals -- Exhibits compliance with exercise, home  and travel O2 prescription;Maintenance of O2 saturations>88%;Compliance with respiratory medication;Verbalizes importance of monitoring SPO2 with pulse oximeter and return demonstration;Exhibits proper breathing techniques, such as pursed lip breathing or other method taught during program session;Demonstrates proper use of MDI's Exhibits compliance with exercise, home  and travel O2 prescription;Maintenance of O2 saturations>88%;Compliance with respiratory medication;Verbalizes importance of monitoring SPO2 with pulse oximeter and return demonstration;Exhibits proper breathing techniques, such as pursed lip breathing or other method taught during program session;Demonstrates proper use of MDI's Exhibits compliance with exercise, home  and travel O2 prescription;Maintenance of O2 saturations>88%;Compliance with respiratory medication;Verbalizes importance of monitoring SPO2 with pulse oximeter and return  demonstration;Exhibits proper breathing techniques, such as pursed lip breathing or other method taught during program session;Demonstrates proper use of MDI's    Goals/Expected Outcomes Compliance and understanding of oxygen  saturation monitoring and breathing techniques to decrease shortness of breath. Compliance and understanding of oxygen  saturation monitoring and breathing techniques to decrease shortness of breath. Compliance and understanding of oxygen  saturation monitoring and breathing techniques to decrease shortness of breath. Compliance and understanding of oxygen  saturation monitoring and breathing techniques to decrease shortness of breath.       Oxygen  Discharge (Final Oxygen  Re-Evaluation):  Oxygen  Re-Evaluation - 11/26/23 0920       Program Oxygen  Prescription   Program Oxygen  Prescription None      Home Oxygen    Home Oxygen  Device Home Concentrator    Sleep Oxygen  Prescription Continuous    Liters per minute 2.5    Home Exercise Oxygen  Prescription None    Home Resting Oxygen  Prescription None    Compliance with Home Oxygen  Use Yes      Goals/Expected Outcomes   Short Term Goals To learn and exhibit compliance with exercise, home and travel O2 prescription;To learn and understand importance of monitoring SPO2 with pulse oximeter and demonstrate accurate use of the pulse oximeter.;To learn and understand importance of maintaining oxygen  saturations>88%;To learn and demonstrate proper pursed lip breathing techniques or other breathing techniques. ;To learn and demonstrate proper use of respiratory medications    Long  Term Goals Exhibits compliance with exercise, home  and travel O2 prescription;Maintenance of O2 saturations>88%;Compliance with respiratory medication;Verbalizes importance of monitoring SPO2 with pulse oximeter and return demonstration;Exhibits proper breathing techniques, such as pursed lip breathing or other method taught during program session;Demonstrates  proper use of MDI's    Goals/Expected Outcomes Compliance and understanding of oxygen  saturation monitoring and breathing techniques to decrease shortness of breath.          Initial Exercise Prescription:  Initial Exercise Prescription - 09/13/23 1100       Date of Initial Exercise RX and Referring Provider   Date 09/13/23    Referring Provider Desai    Expected Discharge Date 12/09/23      Treadmill   MPH 1.4    Grade 0    Minutes 15    METs 2.07      Bike   Level 1    Watts 50    Minutes 15    METs 2      Prescription Details   Frequency (times  per week) 2    Duration Progress to 30 minutes of continuous aerobic without signs/symptoms of physical distress      Intensity   THRR 40-80% of Max Heartrate 56-113    Ratings of Perceived Exertion 11-13    Perceived Dyspnea 0-4      Progression   Progression Continue to progress workloads to maintain intensity without signs/symptoms of physical distress.      Resistance Training   Training Prescription Yes    Weight blue bands    Reps 10-15          Perform Capillary Blood Glucose checks as needed.  Exercise Prescription Changes:   Exercise Prescription Changes     Row Name 10/07/23 1208 10/26/23 1200 11/09/23 1100 11/23/23 1100 12/07/23 1100     Response to Exercise   Blood Pressure (Admit) 132/64 126/64 124/60 112/60 110/60   Blood Pressure (Exercise) -- 128/56 132/62 122/60 120/58   Blood Pressure (Exit) 112/68 112/56 118/60 110/60 108/60   Heart Rate (Admit) 80 bpm 89 bpm 69 bpm 82 bpm 78 bpm   Heart Rate (Exercise) 99 bpm 102 bpm 100 bpm 97 bpm 74 bpm   Heart Rate (Exit) 85 bpm 86 bpm 83 bpm 83 bpm 80 bpm   Oxygen  Saturation (Admit) 100 % 95 % 98 % 97 % 97 %   Oxygen  Saturation (Exercise) 92 % 95 % 95 % 92 % 94 %   Oxygen  Saturation (Exit) 97 % 98 % 96 % 98 % 96 %   Rating of Perceived Exertion (Exercise) 10 11 10 11 9    Perceived Dyspnea (Exercise) 1 2 1 1 1    Duration Continue with 30 min of  aerobic exercise without signs/symptoms of physical distress. Continue with 30 min of aerobic exercise without signs/symptoms of physical distress. Continue with 30 min of aerobic exercise without signs/symptoms of physical distress. Continue with 30 min of aerobic exercise without signs/symptoms of physical distress. Continue with 30 min of aerobic exercise without signs/symptoms of physical distress.   Intensity THRR unchanged THRR unchanged THRR unchanged THRR unchanged THRR unchanged     Progression   Progression -- Continue to progress workloads to maintain intensity without signs/symptoms of physical distress. Continue to progress workloads to maintain intensity without signs/symptoms of physical distress. Continue to progress workloads to maintain intensity without signs/symptoms of physical distress. Continue to progress workloads to maintain intensity without signs/symptoms of physical distress.     Resistance Training   Training Prescription Yes Yes Yes Yes Yes   Weight blue bands black bands black bands black bands black bands   Reps 10-15 10-15 10-15 10-15 10-15   Time 10 Minutes 10 Minutes 10 Minutes 10 Minutes 10 Minutes     Treadmill   MPH 2.4 2.2 2.4 2.4 2.4   Grade 0 1.5 1.5 2 2    Minutes 15 15 15 15 15    METs 2.7 2.8 3 3.4 3.4     Bike   Level 2 2 2.5 3.5 3.5   Minutes 15 15 15 15 15    METs 2.7 2.7 3 3  3.1      Exercise Comments:   Exercise Comments     Row Name 09/21/23 0857           Exercise Comments Pt completed first day of exericse. He exercised for 15 min on the treadmill and upright bike. He performed the warmup and cooldown standing without limitations. Discussed METs.          Exercise Goals  and Review:   Exercise Goals     Row Name 09/13/23 1039             Exercise Goals   Increase Physical Activity Yes       Intervention Provide advice, education, support and counseling about physical activity/exercise needs.;Develop an individualized  exercise prescription for aerobic and resistive training based on initial evaluation findings, risk stratification, comorbidities and participant's personal goals.       Expected Outcomes Short Term: Attend rehab on a regular basis to increase amount of physical activity.;Long Term: Add in home exercise to make exercise part of routine and to increase amount of physical activity.;Long Term: Exercising regularly at least 3-5 days a week.       Increase Strength and Stamina Yes       Intervention Provide advice, education, support and counseling about physical activity/exercise needs.;Develop an individualized exercise prescription for aerobic and resistive training based on initial evaluation findings, risk stratification, comorbidities and participant's personal goals.       Expected Outcomes Short Term: Increase workloads from initial exercise prescription for resistance, speed, and METs.;Short Term: Perform resistance training exercises routinely during rehab and add in resistance training at home;Long Term: Improve cardiorespiratory fitness, muscular endurance and strength as measured by increased METs and functional capacity ( )       Able to understand and use rate of perceived exertion (RPE) scale Yes       Intervention Provide education and explanation on how to use RPE scale       Expected Outcomes Short Term: Able to use RPE daily in rehab to express subjective intensity level;Long Term:  Able to use RPE to guide intensity level when exercising independently       Able to understand and use Dyspnea scale Yes       Intervention Provide education and explanation on how to use Dyspnea scale       Expected Outcomes Short Term: Able to use Dyspnea scale daily in rehab to express subjective sense of shortness of breath during exertion;Long Term: Able to use Dyspnea scale to guide intensity level when exercising independently       Knowledge and understanding of Target Heart Rate Range (THRR) Yes        Intervention Provide education and explanation of THRR including how the numbers were predicted and where they are located for reference       Expected Outcomes Short Term: Able to state/look up THRR;Long Term: Able to use THRR to govern intensity when exercising independently;Short Term: Able to use daily as guideline for intensity in rehab       Understanding of Exercise Prescription Yes       Intervention Provide education, explanation, and written materials on patient's individual exercise prescription       Expected Outcomes Short Term: Able to explain program exercise prescription;Long Term: Able to explain home exercise prescription to exercise independently          Exercise Goals Re-Evaluation :  Exercise Goals Re-Evaluation     Row Name 09/13/23 1202 10/06/23 0859 11/01/23 0925 11/26/23 0917       Exercise Goal Re-Evaluation   Exercise Goals Review Increase Physical Activity;Able to understand and use Dyspnea scale;Understanding of Exercise Prescription;Increase Strength and Stamina;Knowledge and understanding of Target Heart Rate Range (THRR);Able to understand and use rate of perceived exertion (RPE) scale Increase Physical Activity;Able to understand and use Dyspnea scale;Understanding of Exercise Prescription;Increase Strength and Stamina;Knowledge and understanding of Target Heart Rate  Range (THRR);Able to understand and use rate of perceived exertion (RPE) scale Increase Physical Activity;Able to understand and use Dyspnea scale;Understanding of Exercise Prescription;Increase Strength and Stamina;Knowledge and understanding of Target Heart Rate Range (THRR);Able to understand and use rate of perceived exertion (RPE) scale Increase Physical Activity;Able to understand and use Dyspnea scale;Understanding of Exercise Prescription;Increase Strength and Stamina;Knowledge and understanding of Target Heart Rate Range (THRR);Able to understand and use rate of perceived exertion (RPE) scale     Comments Pt is scheduled to begin exercise on 8/12. Will continue to monitor and progress as able. Charlena has completed 5 exercise sessions. He exercises for 15 min on the treadmill and upright bike. He averages 2.5 METs at 2.2 mph on the treadmill and 2.7 METs at level 1-2 on the upright bike. Pt performs the warmup and cooldown standing without limitations. He has increased his speed on the treadmill and level on the bike as he tolerates progressions well. Will continue to monitor and progress as able. Charlena has completed 10 exercise sessions. He exercises for 15 min on the treadmill and upright bike. He averages 2.8 METs at 2.2 mph and 1.5% incline on the treadmill and 2.5 METs at level 1 on the upright bike. Pt performs the warmup and cooldown standing without limitations. He has increased his incine on the treadmill. I am unsure how well he tolerates the bike as level has been decreased. Will continue to monitor and progress as able. Charlena has completed 18 exercise sessions. He exercises for 15 min on the treadmill and upright bike. He averages 3.4 METs at 2.4 mph and 2% incline on the treadmill and 3.1 METs at level 3.5 on the upright bike. Pt performs the warmup and cooldown standing without limitations. Charlena has increased his speed and incline on the treadmill. His level has also increased on the upright bike. He tolerates these progressions well. Will continue to monitor and progress as able.    Expected Outcomes Through exercise at rehab and home, the patient will decrease shortness of breath with daily activities and feel confident in carrying out an exercise regimen at home. Through exercise at rehab and home, the patient will decrease shortness of breath with daily activities and feel confident in carrying out an exercise regimen at home. Through exercise at rehab and home, the patient will decrease shortness of breath with daily activities and feel confident in carrying out an exercise regimen at home.  Through exercise at rehab and home, the patient will decrease shortness of breath with daily activities and feel confident in carrying out an exercise regimen at home.       Discharge Exercise Prescription (Final Exercise Prescription Changes):  Exercise Prescription Changes - 12/07/23 1100       Response to Exercise   Blood Pressure (Admit) 110/60    Blood Pressure (Exercise) 120/58    Blood Pressure (Exit) 108/60    Heart Rate (Admit) 78 bpm    Heart Rate (Exercise) 74 bpm    Heart Rate (Exit) 80 bpm    Oxygen  Saturation (Admit) 97 %    Oxygen  Saturation (Exercise) 94 %    Oxygen  Saturation (Exit) 96 %    Rating of Perceived Exertion (Exercise) 9    Perceived Dyspnea (Exercise) 1    Duration Continue with 30 min of aerobic exercise without signs/symptoms of physical distress.    Intensity THRR unchanged      Progression   Progression Continue to progress workloads to maintain intensity without signs/symptoms of physical  distress.      Resistance Training   Training Prescription Yes    Weight black bands    Reps 10-15    Time 10 Minutes      Treadmill   MPH 2.4    Grade 2    Minutes 15    METs 3.4      Bike   Level 3.5    Minutes 15    METs 3.1          Nutrition:  Target Goals: Understanding of nutrition guidelines, daily intake of sodium 1500mg , cholesterol 200mg , calories 30% from fat and 7% or less from saturated fats, daily to have 5 or more servings of fruits and vegetables.  Biometrics:  Pre Biometrics - 09/13/23 1151       Pre Biometrics   Grip Strength 25 kg           Nutrition Therapy Plan and Nutrition Goals:  Nutrition Therapy & Goals - 09/21/23 1223       Nutrition Therapy   Diet Heart Healthy Diet    Drug/Food Interactions Statins/Certain Fruits      Personal Nutrition Goals   Nutrition Goal Patient to improve diet quality by using the plate method as a guide for meal planning to include lean protein/plant protein, fruits,  vegetables, whole grains, nonfat dairy as part of a well-balanced diet.    Comments Charlena has medical history of CAD s/p CABG, hyperlipidemia, ischemic cardiomyopathy, unstable angina, COPD2. LDL is well controlled on zetia , statin. He does follow a regular diet. Patient will benefit from participation in pulmonary rehab for nutrition education, exercise, and lifestyle modification.      Intervention Plan   Intervention Prescribe, educate and counsel regarding individualized specific dietary modifications aiming towards targeted core components such as weight, hypertension, lipid management, diabetes, heart failure and other comorbidities.;Nutrition handout(s) given to patient.    Expected Outcomes Short Term Goal: Understand basic principles of dietary content, such as calories, fat, sodium, cholesterol and nutrients.;Long Term Goal: Adherence to prescribed nutrition plan.          Nutrition Assessments:  MEDIFICTS Score Key: >=70 Need to make dietary changes  40-70 Heart Healthy Diet <= 40 Therapeutic Level Cholesterol Diet   Picture Your Plate Scores: <59 Unhealthy dietary pattern with much room for improvement. 41-50 Dietary pattern unlikely to meet recommendations for good health and room for improvement. 51-60 More healthful dietary pattern, with some room for improvement.  >60 Healthy dietary pattern, although there may be some specific behaviors that could be improved.    Nutrition Goals Re-Evaluation:  Nutrition Goals Re-Evaluation     Row Name 09/21/23 1223             Goals   Current Weight 199 lb 8.3 oz (90.5 kg)       Comment LDL 62, Cr 1.32, GFR 55       Expected Outcome Tom has medical history of CAD s/p CABG, hyperlipidemia, ischemic cardiomyopathy, unstable angina, COPD2. LDL is well controlled on zetia , statin. He does follow a regular diet. Patient will benefit from participation in pulmonary rehab for nutrition education, exercise, and lifestyle modification.           Nutrition Goals Discharge (Final Nutrition Goals Re-Evaluation):  Nutrition Goals Re-Evaluation - 09/21/23 1223       Goals   Current Weight 199 lb 8.3 oz (90.5 kg)    Comment LDL 62, Cr 1.32, GFR 55    Expected Outcome Tom has medical history  of CAD s/p CABG, hyperlipidemia, ischemic cardiomyopathy, unstable angina, COPD2. LDL is well controlled on zetia , statin. He does follow a regular diet. Patient will benefit from participation in pulmonary rehab for nutrition education, exercise, and lifestyle modification.          Psychosocial: Target Goals: Acknowledge presence or absence of significant depression and/or stress, maximize coping skills, provide positive support system. Participant is able to verbalize types and ability to use techniques and skills needed for reducing stress and depression.  Initial Review & Psychosocial Screening:  Initial Psych Review & Screening - 09/13/23 1058       Initial Review   Current issues with None Identified      Family Dynamics   Good Support System? Yes      Barriers   Psychosocial barriers to participate in program There are no identifiable barriers or psychosocial needs.      Screening Interventions   Interventions Encouraged to exercise    Expected Outcomes Short Term goal: Identification and review with participant of any Quality of Life or Depression concerns found by scoring the questionnaire.;Long Term goal: The participant improves quality of Life and PHQ9 Scores as seen by post scores and/or verbalization of changes          Quality of Life Scores:  Scores of 19 and below usually indicate a poorer quality of life in these areas.  A difference of  2-3 points is a clinically meaningful difference.  A difference of 2-3 points in the total score of the Quality of Life Index has been associated with significant improvement in overall quality of life, self-image, physical symptoms, and general health in studies assessing  change in quality of life.  PHQ-9: Review Flowsheet       11/23/2023 09/13/2023  Depression screen PHQ 2/9  Decreased Interest 0 0  Down, Depressed, Hopeless 0 0  PHQ - 2 Score 0 0  Altered sleeping 0 0  Tired, decreased energy 1 3  Change in appetite 0 0  Feeling bad or failure about yourself  0 0  Trouble concentrating 1 1  Moving slowly or fidgety/restless 0 0  Suicidal thoughts 0 0  PHQ-9 Score 2 4  Difficult doing work/chores Not difficult at all Not difficult at all   Interpretation of Total Score  Total Score Depression Severity:  1-4 = Minimal depression, 5-9 = Mild depression, 10-14 = Moderate depression, 15-19 = Moderately severe depression, 20-27 = Severe depression   Psychosocial Evaluation and Intervention:  Psychosocial Evaluation - 09/13/23 1131       Psychosocial Evaluation & Interventions   Interventions Encouraged to exercise with the program and follow exercise prescription    Comments Tom's initial PHQ 2-9 score 0/4. Charlena denies any psychosocial barriers or concerns. No needs at this time.    Expected Outcomes For Tom to participate in PR free of any psychosocial barriers or concerns.    Continue Psychosocial Services  No Follow up required          Psychosocial Re-Evaluation:  Psychosocial Re-Evaluation     Row Name 09/14/23 9170 10/05/23 0825 11/01/23 1124 11/29/23 1351       Psychosocial Re-Evaluation   Current issues with None Identified None Identified None Identified None Identified    Comments Monthly psychosocial re-evaulation is as follows: Tom denied any psy/soc barriers or concerns at orientation. He declined any additional resources or referrals. He is scheduled to start the program next week Charlena continues to deny any psychosocial barriers or concerns  at this time. Monthly psychosocial re-evaluation as follows: Charlena continues to deny any psy/soc barriers at this time. States he has good support from his wife and family. Monthly psychosocial  re-evaluation as follows: Charlena continues to deny any psy/soc barriers at this time. States he has good support from his wife and family.    Expected Outcomes For Tom to attend Pulm Rehab without any psy/soc concerns or barriers For Tom to attend Pulm Rehab without any psy/soc concerns or barriers For Tom to attend Pulm Rehab without any psy/soc concerns or barriers For Tom to attend Pulm Rehab without any psy/soc concerns or barriers    Interventions Encouraged to attend Pulmonary Rehabilitation for the exercise Encouraged to attend Pulmonary Rehabilitation for the exercise Encouraged to attend Pulmonary Rehabilitation for the exercise Encouraged to attend Pulmonary Rehabilitation for the exercise    Continue Psychosocial Services  No Follow up required No Follow up required No Follow up required No Follow up required       Psychosocial Discharge (Final Psychosocial Re-Evaluation):  Psychosocial Re-Evaluation - 11/29/23 1351       Psychosocial Re-Evaluation   Current issues with None Identified    Comments Monthly psychosocial re-evaluation as follows: Charlena continues to deny any psy/soc barriers at this time. States he has good support from his wife and family.    Expected Outcomes For Tom to attend Pulm Rehab without any psy/soc concerns or barriers    Interventions Encouraged to attend Pulmonary Rehabilitation for the exercise    Continue Psychosocial Services  No Follow up required          Education: Education Goals: Education classes will be provided on a weekly basis, covering required topics. Participant will state understanding/return demonstration of topics presented.  Learning Barriers/Preferences:  Learning Barriers/Preferences - 09/13/23 1058       Learning Barriers/Preferences   Learning Barriers Hearing   wears hearing aids   Learning Preferences None          Education Topics: Know Your Numbers Group instruction that is supported by a PowerPoint presentation.  Instructor discusses importance of knowing and understanding resting, exercise, and post-exercise oxygen  saturation, heart rate, and blood pressure. Oxygen  saturation, heart rate, blood pressure, rating of perceived exertion, and dyspnea are reviewed along with a normal range for these values.  Flowsheet Row PULMONARY REHAB CHRONIC OBSTRUCTIVE PULMONARY DISEASE from 10/28/2023 in [facility]  Date 10/28/23  Educator EP  Instruction Review Code 1- Verbalizes Understanding    Exercise for the Pulmonary Patient Group instruction that is supported by a PowerPoint presentation. Instructor discusses benefits of exercise, core components of exercise, frequency, duration, and intensity of an exercise routine, importance of utilizing pulse oximetry during exercise, safety while exercising, and options of places to exercise outside of rehab.    MET Level  Group instruction provided by PowerPoint, verbal discussion, and written material to support subject matter. Instructor reviews what METs are and how to increase METs.  Flowsheet Row PULMONARY REHAB CHRONIC OBSTRUCTIVE PULMONARY DISEASE from 09/23/2023 in [facility] for Heart, Vascular, & Lung Health  Date 09/23/23  Educator EP  Instruction Review Code 1- Verbalizes Understanding    Pulmonary Medications Verbally interactive group education provided by instructor with focus on inhaled medications and proper administration. Flowsheet Row PULMONARY REHAB CHRONIC OBSTRUCTIVE PULMONARY DISEASE from 10/14/2023 in [facility] for Heart, Vascular, & Lung Health  Date 10/14/23  Educator RT  Instruction  Review Code 1- Equities Trader and Physiology of the Respiratory System Group instruction provided by PowerPoint, verbal discussion, and written material to support subject matter. Instructor reviews respiratory cycle and anatomical  components of the respiratory system and their functions. Instructor also reviews differences in obstructive and restrictive respiratory diseases with examples of each.  Flowsheet Row PULMONARY REHAB CHRONIC OBSTRUCTIVE PULMONARY DISEASE from 10/07/2023 in [facility] for Heart, Vascular, & Lung Health  Date 10/07/23  Educator RT  Instruction Review Code 1- Verbalizes Understanding    Oxygen  Safety Group instruction provided by PowerPoint, verbal discussion, and written material to support subject matter. There is an overview of "What is Oxygen " and "Why do we need it".  Instructor also reviews how to create a safe environment for oxygen  use, the importance of using oxygen  as prescribed, and the risks of noncompliance. There is a brief discussion on traveling with oxygen  and resources the patient may utilize. Flowsheet Row PULMONARY REHAB CHRONIC OBSTRUCTIVE PULMONARY DISEASE from 11/04/2023 in [facility] for Heart, Vascular, & Lung Health  Date 11/04/23  Educator RN  Instruction Review Code 1- Verbalizes Understanding    Oxygen  Use Group instruction provided by PowerPoint, verbal discussion, and written material to discuss how supplemental oxygen  is prescribed and different types of oxygen  supply systems. Resources for more information are provided.  Flowsheet Row PULMONARY REHAB CHRONIC OBSTRUCTIVE PULMONARY DISEASE from 11/11/2023 in [facility] for Heart, Vascular, & Lung Health  Date 11/11/23  Educator RT  Instruction Review Code 1- Verbalizes Understanding    Breathing Techniques Group instruction that is supported by demonstration and informational handouts. Instructor discusses the benefits of pursed lip and diaphragmatic breathing and detailed demonstration on how to perform both.  Flowsheet Row PULMONARY REHAB CHRONIC OBSTRUCTIVE PULMONARY DISEASE from 11/18/2023 in [facility] for Heart,  Vascular, & Lung Health  Date 11/18/23  Educator RN  Instruction Review Code 1- Verbalizes Understanding     Risk Factor Reduction Group instruction that is supported by a PowerPoint presentation. Instructor discusses the definition of a risk factor, different risk factors for pulmonary disease, and how the heart and lungs work together.   Pulmonary Diseases Group instruction provided by PowerPoint, verbal discussion, and written material to support subject matter. Instructor gives an overview of the different type of pulmonary diseases. There is also a discussion on risk factors and symptoms as well as ways to manage the diseases.   Stress and Energy Conservation Group instruction provided by PowerPoint, verbal discussion, and written material to support subject matter. Instructor gives an overview of stress and the impact it can have on the body. Instructor also reviews ways to reduce stress. There is also a discussion on energy conservation and ways to conserve energy throughout the day. Flowsheet Row PULMONARY REHAB CHRONIC OBSTRUCTIVE PULMONARY DISEASE from 11/25/2023 in [facility] for Heart, Vascular, & Lung Health  Date 11/25/23  Educator RN  Instruction Review Code 1- Verbalizes Understanding    Warning Signs and Symptoms Group instruction provided by PowerPoint, verbal discussion, and written material to support subject matter. Instructor reviews warning signs and symptoms of stroke, heart attack, cold and flu. Instructor also reviews ways to prevent the spread of infection. Flowsheet Row PULMONARY REHAB CHRONIC OBSTRUCTIVE PULMONARY DISEASE from 12/02/2023 in [facility] for Heart, Vascular, & Lung Health  Date 12/02/23  Educator RN  Instruction Review Code 1- Verbalizes Understanding  Other Education Group or individual verbal, written, or video instructions that support the educational goals of the pulmonary rehab  program.    Knowledge Questionnaire Score:  Knowledge Questionnaire Score - 11/30/23 1215       Knowledge Questionnaire Score   Pre Score 11/18    Post Score 14/18          Core Components/Risk Factors/Patient Goals at Admission:  Personal Goals and Risk Factors at Admission - 09/13/23 1059       Core Components/Risk Factors/Patient Goals on Admission    Weight Management Yes;Weight Loss    Intervention Weight Management: Develop a combined nutrition and exercise program designed to reach desired caloric intake, while maintaining appropriate intake of nutrient and fiber, sodium and fats, and appropriate energy expenditure required for the weight goal.;Weight Management: Provide education and appropriate resources to help participant work on and attain dietary goals.;Weight Management/Obesity: Establish reasonable short term and long term weight goals.;Obesity: Provide education and appropriate resources to help participant work on and attain dietary goals.    Admit Weight 201 lb 4.5 oz (91.3 kg)    Expected Outcomes Short Term: Continue to assess and modify interventions until short term weight is achieved;Long Term: Adherence to nutrition and physical activity/exercise program aimed toward attainment of established weight goal;Weight Loss: Understanding of general recommendations for a balanced deficit meal plan, which promotes 1-2 lb weight loss per week and includes a negative energy balance of [id number] kcal/d;Understanding recommendations for meals to include 15-35% energy as protein, 25-35% energy from fat, 35-60% energy from carbohydrates, less than 200mg  of dietary cholesterol, 20-35 gm of total fiber daily;Understanding of distribution of calorie intake throughout the day with the consumption of 4-5 meals/snacks    Improve shortness of breath with ADL's Yes    Intervention Provide education, individualized exercise plan and daily activity instruction to help decrease symptoms of SOB  with activities of daily living.    Expected Outcomes Short Term: Improve cardiorespiratory fitness to achieve a reduction of symptoms when performing ADLs;Long Term: Be able to perform more ADLs without symptoms or delay the onset of symptoms          Core Components/Risk Factors/Patient Goals Review:   Goals and Risk Factor Review     Row Name 09/14/23 0834 10/05/23 0826 11/01/23 1125 11/29/23 1351       Core Components/Risk Factors/Patient Goals Review   Personal Goals Review Weight Management/Obesity;Improve shortness of breath with ADL's;Develop more efficient breathing techniques such as purse lipped breathing and diaphragmatic breathing and practicing self-pacing with activity. Weight Management/Obesity;Improve shortness of breath with ADL's;Develop more efficient breathing techniques such as purse lipped breathing and diaphragmatic breathing and practicing self-pacing with activity. Weight Management/Obesity;Improve shortness of breath with ADL's;Develop more efficient breathing techniques such as purse lipped breathing and diaphragmatic breathing and practicing self-pacing with activity. Weight Management/Obesity;Improve shortness of breath with ADL's;Develop more efficient breathing techniques such as purse lipped breathing and diaphragmatic breathing and practicing self-pacing with activity.    Review Monthly re-evaluation of Core Components/Risk Factors/Patient Goals are as follows: Unable to assess Tom's goals yet. He is scheduled to start to the program next week Monthly re-evaluation of Core Components/Risk Factors/Patient Goals are as follows: Goal progressing for weight loss. Charlena has met with the dietitician and is applying her recommendations to meet his weight loss goals. Goal progressing for improving shortness of breath with ADL's. Charlena is currently exercising on RA to keep sats >88%. Goal progressing for developing more efficient breathing techniques such as  purse lipped breathing  and diaphragmatic breathing; and practicing self-pacing with activity. We will continue to monitor Tom's progress throughout the program. Monthly review of patient's Core Components/Risk Factors/Patient Goals are as follows: Goal progressing for weight loss. Charlena has met with the dietitian and is applying her recommendations to meet his weight loss goals. He has maintained his weight since starting the program. Goal progressing for improving shortness of breath with ADL's. Charlena is currently exercising on RA to keep sats >88%. He has been able to increase his speed and incline on the treadmill and resistance, speed, and revolutions per minute on the bike. Goal met for developing more efficient breathing techniques such as purse lipped breathing and diaphragmatic breathing; and practicing self-pacing with activity. John can perform purse lipped breathing while short of breath. He demonstrated this while performing the warmup and exercising. He can initiate PLB on his own. He works on diaphragmatic breathing at home. We will continue to monitor Tom's progress throughout the program. Monthly review of patient's Core Components/Risk Factors/Patient Goals are as follows: Goal progressing for weight loss. Charlena has met with the dietitian and is applying her recommendations to meet his weight loss goals. He has maintained his weight since starting the program. Goal progressing for improving shortness of breath with ADL's. Charlena is currently exercising on RA to keep sats >88%. He has been able to increase his speed and incline on the treadmill and resistance, speed, and revolutions per minute on the bike. We will continue to monitor Tom's progress throughout the program.    Expected Outcomes Pt will show progress toward meeting expected goals and outcomes. To improve shortness of breath with ADL's, lose weight and develop more efficient breathing techniques such as purse lipped breathing and diaphragmatic breathing; and  practicing self-pacing with activity. Pt will show progress toward meeting expected goals and outcomes. Pt will show progress toward meeting expected goals and outcomes.       Core Components/Risk Factors/Patient Goals at Discharge (Final Review):   Goals and Risk Factor Review - 11/29/23 1351       Core Components/Risk Factors/Patient Goals Review   Personal Goals Review Weight Management/Obesity;Improve shortness of breath with ADL's;Develop more efficient breathing techniques such as purse lipped breathing and diaphragmatic breathing and practicing self-pacing with activity.    Review Monthly review of patient's Core Components/Risk Factors/Patient Goals are as follows: Goal progressing for weight loss. Charlena has met with the dietitian and is applying her recommendations to meet his weight loss goals. He has maintained his weight since starting the program. Goal progressing for improving shortness of breath with ADL's. Charlena is currently exercising on RA to keep sats >88%. He has been able to increase his speed and incline on the treadmill and resistance, speed, and revolutions per minute on the bike. We will continue to monitor Tom's progress throughout the program.    Expected Outcomes Pt will show progress toward meeting expected goals and outcomes.          ITP Comments:   Comments: Pt is making expected progress toward Pulmonary Rehab goals after completing 21 session(s). Recommend continued exercise, life style modification, education, and utilization of breathing techniques to increase stamina and strength, while also decreasing shortness of breath with exertion.  Dr. Slater Staff is Medical Director for Pulmonary Rehab at [facility].

## 2023-12-09 ENCOUNTER — Encounter (HOSPITAL_COMMUNITY)
Admission: RE | Admit: 2023-12-09 | Discharge: 2023-12-09 | Disposition: A | Source: Ambulatory Visit | Attending: Internal Medicine

## 2023-12-09 ENCOUNTER — Encounter (HOSPITAL_COMMUNITY): Admission: RE | Admit: 2023-12-09 | Source: Ambulatory Visit

## 2023-12-09 ENCOUNTER — Ambulatory Visit: Payer: PPO | Admitting: Oncology

## 2023-12-09 DIAGNOSIS — L218 Other seborrheic dermatitis: Secondary | ICD-10-CM | POA: Diagnosis not present

## 2023-12-09 DIAGNOSIS — Z85828 Personal history of other malignant neoplasm of skin: Secondary | ICD-10-CM | POA: Diagnosis not present

## 2023-12-09 DIAGNOSIS — J449 Chronic obstructive pulmonary disease, unspecified: Secondary | ICD-10-CM

## 2023-12-09 DIAGNOSIS — L720 Epidermal cyst: Secondary | ICD-10-CM | POA: Diagnosis not present

## 2023-12-09 DIAGNOSIS — B372 Candidiasis of skin and nail: Secondary | ICD-10-CM | POA: Diagnosis not present

## 2023-12-09 DIAGNOSIS — I788 Other diseases of capillaries: Secondary | ICD-10-CM | POA: Diagnosis not present

## 2023-12-09 NOTE — Progress Notes (Signed)
 Daily Session Note  Patient Details  Name: Joseph Soto MRN: [id number] Date of Birth: [birth year] Referring Provider:   [name] Pulmonary Rehab Walk Test from 09/13/2023 in [facility]  Referring Provider [name]    Encounter Date: 12/09/2023  Check In:  Session Check In - 12/09/23 1127       Check-In   Supervising physician immediately available to respond to emergencies [facility] MD immediately available    Physician(s) Rosaline Skains, NP    Location [facility]    Staff Present Augustin Sharps, Candia Levin, RN, BSN;Randi Reeve BS, ACSM-CEP, Exercise Physiologist;Autumn Pruitt Nicholaus, MS, ACSM-CEP, Exercise Physiologist    Virtual Visit No    Medication changes reported     No    Fall or balance concerns reported    No    Tobacco Cessation No Change    Warm-up and Cool-down Performed as group-led instruction    Resistance Training Performed Yes    VAD Patient? No    PAD/SET Patient? No      Pain Assessment   Currently in Pain? No/denies    Multiple Pain Sites No          Capillary Blood Glucose: No results found for this or any previous visit (from the past 24 hours).    Social History   Tobacco Use  Smoking Status Former   Current packs/day: 0.00   Average packs/day: 2.0 packs/day for 30.0 years (60.0 ttl pk-yrs)   Types: Cigarettes   Start date: 05/17/1970   Quit date: 05/16/2000   Years since quitting: 23.5  Smokeless Tobacco Never  Tobacco Comments   quit 06/2005    Goals Met:  Proper associated with RPD/PD & O2 Sat Independence with exercise equipment Exercise tolerated well No report of concerns or symptoms today Strength training completed today  Goals Unmet:  Not Applicable  Comments: Service time is from 1015 to 1150.    Dr. Slater Staff is Medical Director for Pulmonary Rehab at [facility].

## 2023-12-10 NOTE — Progress Notes (Signed)
 Discharge Progress Report  Patient Details  Name: Joseph Soto MRN: [id number] Date of Birth: 24-Jun-1945 Referring Provider:   [name] Pulmonary Rehab Walk Test from 09/13/2023 in [facility]  Referring Provider Desai     Number of Visits: 22  Reason for Discharge:  Patient has met program and personal goals.  Smoking History:  Social History   Tobacco Use  Smoking Status Former   Current packs/day: 0.00   Average packs/day: 2.0 packs/day for 30.0 years (60.0 ttl pk-yrs)   Types: Cigarettes   Start date: 05/17/1970   Quit date: 05/16/2000   Years since quitting: 23.5  Smokeless Tobacco Never  Tobacco Comments   quit 06/2005    Diagnosis:  Stage 2 moderate COPD by GOLD classification [facility]  ADL [facility]:  Pulmonary Assessment Scores     Row Name 09/13/23 1127 11/23/23 1546 11/30/23 1215     ADL [facility]SD   ADL Phase Entry Exit Exit   SOB Score total 41 -- 13     CAT Score   CAT Score 21 8 8      mMRC Score   mMRC Score 2 -- --      Initial Exercise Prescription:  Initial Exercise Prescription - 09/13/23 1100       Date of Initial Exercise RX and Referring Provider   Date 09/13/23    Referring Provider [name]    Expected Discharge Date 12/09/23      Treadmill   MPH 1.4    Grade 0    Minutes 15    METs 2.07      Bike   Level 1    Watts 50    Minutes 15    METs 2      Prescription Details   Frequency (times per week) 2    Duration Progress to 30 minutes of continuous aerobic without signs/symptoms of physical distress      Intensity   THRR 40-80% of Max Heartrate 56-113    Ratings of Perceived Exertion 11-13    Perceived Dyspnea 0-4      Progression   Progression Continue to progress workloads to maintain intensity without signs/symptoms of physical distress.      Resistance Training   Training Prescription Yes    Weight blue bands    Reps 10-15          Discharge Exercise  Prescription (Final Exercise Prescription Changes):  Exercise Prescription Changes - 12/07/23 1100       Response to Exercise   Blood Pressure (Admit) 110/60    Blood Pressure (Exercise) 120/58    Blood Pressure (Exit) 108/60    Heart Rate (Admit) 78 bpm    Heart Rate (Exercise) 74 bpm    Heart Rate (Exit) 80 bpm    Oxygen  Saturation (Admit) 97 %    Oxygen  Saturation (Exercise) 94 %    Oxygen  Saturation (Exit) 96 %    Rating of Perceived Exertion (Exercise) 9    Perceived Dyspnea (Exercise) 1    Duration Continue with 30 min of aerobic exercise without signs/symptoms of physical distress.    Intensity THRR unchanged      Progression   Progression Continue to progress workloads to maintain intensity without signs/symptoms of physical distress.      Resistance Training   Training Prescription Yes    Weight black bands    Reps 10-15    Time 10 Minutes      Treadmill  MPH 2.4    Grade 2    Minutes 15    METs 3.4      Bike   Level 3.5    Minutes 15    METs 3.1          Functional Capacity:  6 Minute Walk     Row Name 09/13/23 1147 12/09/23 1529       6 Minute Walk   Phase Initial Discharge    Distance 1021 feet 1440 feet    Distance % Change -- 41.04 %    Distance Feet Change -- 419 ft    Walk Time 6 minutes 6 minutes    # of Rest Breaks 0 0    MPH 1.93 2.73    METS 1.81 2.78    RPE 12 13    Perceived Dyspnea  1 3    VO2 Peak 6.34 9.73    Symptoms No No    Resting HR 85 bpm 80 bpm    Resting BP 126/60 122/68    Resting Oxygen  Saturation  95 % 97 %    Exercise Oxygen  Saturation  during 6 min walk 91 % 89 %    Max Ex. HR 89 bpm 104 bpm    Max Ex. BP 126/60 142/64    2 Minute Post BP 112/60 124/60      Interval HR   1 Minute HR 79 96    2 Minute HR 83 100    3 Minute HR 89 101    4 Minute HR 87 103    5 Minute HR 79 104    6 Minute HR 82 104    2 Minute Post HR 77 92    Interval Heart Rate? Yes Yes      Interval Oxygen    Interval Oxygen ? Yes  Yes    Baseline Oxygen  Saturation % 95 % 97 %    1 Minute Oxygen  Saturation % 91 % 97 %    1 Minute Liters of Oxygen  0 L 0 L    2 Minute Oxygen  Saturation % 95 % 92 %    2 Minute Liters of Oxygen  0 L 0 L    3 Minute Oxygen  Saturation % 91 % 91 %    3 Minute Liters of Oxygen  0 L 0 L    4 Minute Oxygen  Saturation % 96 % 90 %    4 Minute Liters of Oxygen  0 L 0 L    5 Minute Oxygen  Saturation % 93 % 89 %    5 Minute Liters of Oxygen  0 L 0 L    6 Minute Oxygen  Saturation % 91 % 89 %    6 Minute Liters of Oxygen  0 L 0 L    2 Minute Post Oxygen  Saturation % 98 % 97 %    2 Minute Post Liters of Oxygen  0 L 0 L       Psychological, QOL, Others - Outcomes: PHQ 2/9:    11/23/2023    3:45 PM 09/13/2023   10:38 AM  Depression screen PHQ 2/9  Decreased Interest 0 0  Down, Depressed, Hopeless 0 0  PHQ - 2 Score 0 0  Altered sleeping 0 0  Tired, decreased energy 1 3  Change in appetite 0 0  Feeling bad or failure about yourself  0 0  Trouble concentrating 1 1  Moving slowly or fidgety/restless 0 0  Suicidal thoughts 0 0  PHQ-9 Score 2 4  Difficult doing work/chores Not difficult at  all Not difficult at all    Quality of Life:   Personal Goals: Goals established at orientation with interventions provided to work toward goal.  Personal Goals and Risk Factors at Admission - 09/13/23 1059       Core Components/Risk Factors/Patient Goals on Admission    Weight Management Yes;Weight Loss    Intervention Weight Management: Develop a combined nutrition and exercise program designed to reach desired caloric intake, while maintaining appropriate intake of nutrient and fiber, sodium and fats, and appropriate energy expenditure required for the weight goal.;Weight Management: Provide education and appropriate resources to help participant work on and attain dietary goals.;Weight Management/Obesity: Establish reasonable short term and long term weight goals.;Obesity: Provide education and  appropriate resources to help participant work on and attain dietary goals.    Admit Weight 201 lb 4.5 oz (91.3 kg)    Expected Outcomes Short Term: Continue to assess and modify interventions until short term weight is achieved;Long Term: Adherence to nutrition and physical activity/exercise program aimed toward attainment of established weight goal;Weight Loss: Understanding of general recommendations for a balanced deficit meal plan, which promotes 1-2 lb weight loss per week and includes a negative energy balance of [phone] kcal/d;Understanding recommendations for meals to include 15-35% energy as protein, 25-35% energy from fat, 35-60% energy from carbohydrates, less than 200mg  of dietary cholesterol, 20-35 gm of total fiber daily;Understanding of distribution of calorie intake throughout the day with the consumption of 4-5 meals/snacks    Improve shortness of breath with ADL's Yes    Intervention Provide education, individualized exercise plan and daily activity instruction to help decrease symptoms of SOB with activities of daily living.    Expected Outcomes Short Term: Improve cardiorespiratory fitness to achieve a reduction of symptoms when performing ADLs;Long Term: Be able to perform more ADLs without symptoms or delay the onset of symptoms           Personal Goals Discharge:  Goals and Risk Factor Review     Row Name 09/14/23 0834 10/05/23 0826 11/01/23 1125 11/29/23 1351 12/10/23 0847     Core Components/Risk Factors/Patient Goals Review   Personal Goals Review Weight Management/Obesity;Improve shortness of breath with ADL's;Develop more efficient breathing techniques such as purse lipped breathing and diaphragmatic breathing and practicing self-pacing with activity. Weight Management/Obesity;Improve shortness of breath with ADL's;Develop more efficient breathing techniques such as purse lipped breathing and diaphragmatic breathing and practicing self-pacing with activity. Weight  Management/Obesity;Improve shortness of breath with ADL's;Develop more efficient breathing techniques such as purse lipped breathing and diaphragmatic breathing and practicing self-pacing with activity. Weight Management/Obesity;Improve shortness of breath with ADL's;Develop more efficient breathing techniques such as purse lipped breathing and diaphragmatic breathing and practicing self-pacing with activity. Weight Management/Obesity;Improve shortness of breath with ADL's   Review Monthly re-evaluation of Core Components/Risk Factors/Patient Goals are as follows: Unable to assess Tom's goals yet. He is scheduled to start to the program next week Monthly re-evaluation of Core Components/Risk Factors/Patient Goals are as follows: Goal progressing for weight loss. Charlena has met with the dietitician and is applying her recommendations to meet his weight loss goals. Goal progressing for improving shortness of breath with ADL's. Charlena is currently exercising on RA to keep sats >88%. Goal progressing for developing more efficient breathing techniques such as purse lipped breathing and diaphragmatic breathing; and practicing self-pacing with activity. We will continue to monitor Tom's progress throughout the program. Monthly review of patient's Core Components/Risk Factors/Patient Goals are as follows: Goal progressing for weight loss. Charlena  has met with the dietitian and is applying her recommendations to meet his weight loss goals. He has maintained his weight since starting the program. Goal progressing for improving shortness of breath with ADL's. Charlena is currently exercising on RA to keep sats >88%. He has been able to increase his speed and incline on the treadmill and resistance, speed, and revolutions per minute on the bike. Goal met for developing more efficient breathing techniques such as purse lipped breathing and diaphragmatic breathing; and practicing self-pacing with activity. John can perform purse lipped  breathing while short of breath. He demonstrated this while performing the warmup and exercising. He can initiate PLB on his own. He works on diaphragmatic breathing at home. We will continue to monitor Tom's progress throughout the program. Monthly review of patient's Core Components/Risk Factors/Patient Goals are as follows: Goal progressing for weight loss. Charlena has met with the dietitian and is applying her recommendations to meet his weight loss goals. He has maintained his weight since starting the program. Goal progressing for improving shortness of breath with ADL's. Charlena is currently exercising on RA to keep sats >88%. He has been able to increase his speed and incline on the treadmill and resistance, speed, and revolutions per minute on the bike. We will continue to monitor Tom's progress throughout the program. Charlena graduated from the PR program on 12/09/23. Unfortunately, he did not meet his goal for weight loss. He was up 3#'s from his starting weight at the time of graduation. Goal for improving shortness of breath with ADL's was met. His initial SOB score 41/ post score 13. Tom did great during the program and we wish him the best.   Expected Outcomes Pt will show progress toward meeting expected goals and outcomes. To improve shortness of breath with ADL's, lose weight and develop more efficient breathing techniques such as purse lipped breathing and diaphragmatic breathing; and practicing self-pacing with activity. Pt will show progress toward meeting expected goals and outcomes. Pt will show progress toward meeting expected goals and outcomes. To continue to exercise and modify his nutrition and lifestyle post graduation      Exercise Goals and Review:  Exercise Goals     Row Name 09/13/23 1039             Exercise Goals   Increase Physical Activity Yes       Intervention Provide advice, education, support and counseling about physical activity/exercise needs.;Develop an individualized  exercise prescription for aerobic and resistive training based on initial evaluation findings, risk stratification, comorbidities and participant's personal goals.       Expected Outcomes Short Term: Attend rehab on a regular basis to increase amount of physical activity.;Long Term: Add in home exercise to make exercise part of routine and to increase amount of physical activity.;Long Term: Exercising regularly at least 3-5 days a week.       Increase Strength and Stamina Yes       Intervention Provide advice, education, support and counseling about physical activity/exercise needs.;Develop an individualized exercise prescription for aerobic and resistive training based on initial evaluation findings, risk stratification, comorbidities and participant's personal goals.       Expected Outcomes Short Term: Increase workloads from initial exercise prescription for resistance, speed, and METs.;Short Term: Perform resistance training exercises routinely during rehab and add in resistance training at home;Long Term: Improve cardiorespiratory fitness, muscular endurance and strength as measured by increased METs and functional capacity ( )       Able to understand  and use rate of perceived exertion (RPE) scale Yes       Intervention Provide education and explanation on how to use RPE scale       Expected Outcomes Short Term: Able to use RPE daily in rehab to express subjective intensity level;Long Term:  Able to use RPE to guide intensity level when exercising independently       Able to understand and use Dyspnea scale Yes       Intervention Provide education and explanation on how to use Dyspnea scale       Expected Outcomes Short Term: Able to use Dyspnea scale daily in rehab to express subjective sense of shortness of breath during exertion;Long Term: Able to use Dyspnea scale to guide intensity level when exercising independently       Knowledge and understanding of Target Heart Rate Range (THRR) Yes        Intervention Provide education and explanation of THRR including how the numbers were predicted and where they are located for reference       Expected Outcomes Short Term: Able to state/look up THRR;Long Term: Able to use THRR to govern intensity when exercising independently;Short Term: Able to use daily as guideline for intensity in rehab       Understanding of Exercise Prescription Yes       Intervention Provide education, explanation, and written materials on patient's individual exercise prescription       Expected Outcomes Short Term: Able to explain program exercise prescription;Long Term: Able to explain home exercise prescription to exercise independently          Exercise Goals Re-Evaluation:  Exercise Goals Re-Evaluation     Row Name 09/13/23 1202 10/06/23 0859 11/01/23 0925 11/26/23 0917 12/09/23 1204     Exercise Goal Re-Evaluation   Exercise Goals Review Increase Physical Activity;Able to understand and use Dyspnea scale;Understanding of Exercise Prescription;Increase Strength and Stamina;Knowledge and understanding of Target Heart Rate Range (THRR);Able to understand and use rate of perceived exertion (RPE) scale Increase Physical Activity;Able to understand and use Dyspnea scale;Understanding of Exercise Prescription;Increase Strength and Stamina;Knowledge and understanding of Target Heart Rate Range (THRR);Able to understand and use rate of perceived exertion (RPE) scale Increase Physical Activity;Able to understand and use Dyspnea scale;Understanding of Exercise Prescription;Increase Strength and Stamina;Knowledge and understanding of Target Heart Rate Range (THRR);Able to understand and use rate of perceived exertion (RPE) scale Increase Physical Activity;Able to understand and use Dyspnea scale;Understanding of Exercise Prescription;Increase Strength and Stamina;Knowledge and understanding of Target Heart Rate Range (THRR);Able to understand and use rate of perceived exertion  (RPE) scale Increase Physical Activity;Able to understand and use Dyspnea scale;Understanding of Exercise Prescription;Increase Strength and Stamina;Knowledge and understanding of Target Heart Rate Range (THRR);Able to understand and use rate of perceived exertion (RPE) scale   Comments Pt is scheduled to begin exercise on 8/12. Will continue to monitor and progress as able. Charlena has completed 5 exercise sessions. He exercises for 15 min on the treadmill and upright bike. He averages 2.5 METs at 2.2 mph on the treadmill and 2.7 METs at level 1-2 on the upright bike. Pt performs the warmup and cooldown standing without limitations. He has increased his speed on the treadmill and level on the bike as he tolerates progressions well. Will continue to monitor and progress as able. Charlena has completed 10 exercise sessions. He exercises for 15 min on the treadmill and upright bike. He averages 2.8 METs at 2.2 mph and 1.5% incline on the treadmill and  2.5 METs at level 1 on the upright bike. Pt performs the warmup and cooldown standing without limitations. He has increased his incine on the treadmill. I am unsure how well he tolerates the bike as level has been decreased. Will continue to monitor and progress as able. Charlena has completed 18 exercise sessions. He exercises for 15 min on the treadmill and upright bike. He averages 3.4 METs at 2.4 mph and 2% incline on the treadmill and 3.1 METs at level 3.5 on the upright bike. Pt performs the warmup and cooldown standing without limitations. Charlena has increased his speed and incline on the treadmill. His level has also increased on the upright bike. He tolerates these progressions well. Will continue to monitor and progress as able. Charlena has completed 22 exercise sessions. His peak METs were 3.4 on the treadmill and 3.1 on the upright bike. He plans to exercise at home.   Expected Outcomes Through exercise at rehab and home, the patient will decrease shortness of breath with daily  activities and feel confident in carrying out an exercise regimen at home. Through exercise at rehab and home, the patient will decrease shortness of breath with daily activities and feel confident in carrying out an exercise regimen at home. Through exercise at rehab and home, the patient will decrease shortness of breath with daily activities and feel confident in carrying out an exercise regimen at home. Through exercise at rehab and home, the patient will decrease shortness of breath with daily activities and feel confident in carrying out an exercise regimen at home. Through exercise at rehab and home, the patient will decrease shortness of breath with daily activities and feel confident in carrying out an exercise regimen at home.      Nutrition & Weight - Outcomes:  Pre Biometrics - 09/13/23 1151       Pre Biometrics   Grip Strength 25 kg           Nutrition:  Nutrition Therapy & Goals - 09/21/23 1223       Nutrition Therapy   Diet Heart Healthy Diet    Drug/Food Interactions Statins/Certain Fruits      Personal Nutrition Goals   Nutrition Goal Patient to improve diet quality by using the plate method as a guide for meal planning to include lean protein/plant protein, fruits, vegetables, whole grains, nonfat dairy as part of a well-balanced diet.    Comments Charlena has medical history of CAD s/p CABG, hyperlipidemia, ischemic cardiomyopathy, unstable angina, COPD2. LDL is well controlled on zetia , statin. He does follow a regular diet. Patient will benefit from participation in pulmonary rehab for nutrition education, exercise, and lifestyle modification.      Intervention Plan   Intervention Prescribe, educate and counsel regarding individualized specific dietary modifications aiming towards targeted core components such as weight, hypertension, lipid management, diabetes, heart failure and other comorbidities.;Nutrition handout(s) given to patient.    Expected Outcomes Short Term  Goal: Understand basic principles of dietary content, such as calories, fat, sodium, cholesterol and nutrients.;Long Term Goal: Adherence to prescribed nutrition plan.          Nutrition Discharge:   Education Questionnaire Score:  Knowledge Questionnaire Score - 11/30/23 1215       Knowledge Questionnaire Score   Pre Score 11/18    Post Score 14/18          Goals reviewed with patient; copy given to patient.

## 2023-12-13 ENCOUNTER — Inpatient Hospital Stay: Attending: Oncology | Admitting: Oncology

## 2023-12-13 VITALS — BP 116/65 | HR 88 | Temp 98.4°F | Resp 18 | Ht 71.0 in | Wt 202.6 lb

## 2023-12-13 DIAGNOSIS — C8290 Follicular lymphoma, unspecified, unspecified site: Secondary | ICD-10-CM

## 2023-12-13 DIAGNOSIS — R0989 Other specified symptoms and signs involving the circulatory and respiratory systems: Secondary | ICD-10-CM | POA: Insufficient documentation

## 2023-12-13 DIAGNOSIS — I252 Old myocardial infarction: Secondary | ICD-10-CM | POA: Diagnosis not present

## 2023-12-13 DIAGNOSIS — Z951 Presence of aortocoronary bypass graft: Secondary | ICD-10-CM | POA: Insufficient documentation

## 2023-12-13 DIAGNOSIS — I2510 Atherosclerotic heart disease of native coronary artery without angina pectoris: Secondary | ICD-10-CM | POA: Insufficient documentation

## 2023-12-13 DIAGNOSIS — Z8572 Personal history of non-Hodgkin lymphomas: Secondary | ICD-10-CM | POA: Diagnosis not present

## 2023-12-13 DIAGNOSIS — Z955 Presence of coronary angioplasty implant and graft: Secondary | ICD-10-CM | POA: Diagnosis not present

## 2023-12-13 DIAGNOSIS — E785 Hyperlipidemia, unspecified: Secondary | ICD-10-CM | POA: Diagnosis not present

## 2023-12-13 DIAGNOSIS — I4729 Other ventricular tachycardia: Secondary | ICD-10-CM | POA: Diagnosis not present

## 2023-12-13 NOTE — Progress Notes (Signed)
  [facility] OFFICE PROGRESS NOTE   Diagnosis: Leiomyosarcoma, non-Hodgkin's lymphoma  INTERVAL HISTORY:   Mr. Nay returns for a scheduled visit.  He feels well.  No fever or night sweats.  He recently completed pulmonary rehab.  He reports a few episodes of choking when eating an apple.  No dysphagia.  Objective:  Vital signs in last 24 hours:  Blood pressure 116/65, pulse 88, temperature 98.4 F (36.9 C), temperature source Temporal, resp. rate 18, height 5' 11 (1.803 m), weight 202 lb 9.6 oz (91.9 kg), SpO2 98%.    HEENT: Oropharynx without visible mass, neck without mass Lymphatics: No cervical, supraclavicular, axillary, or inguinal nodes Resp: Breath sounds, end inspiratory rhonchi at the right upper posterior chest, no respiratory distress Cardio: Regular rate and rhythm GI: No hepatosplenomegaly Vascular: No leg edema   Lab Results:  Lab Results  Component Value Date   WBC 12.6 (H) 07/21/2023   HGB 13.0 07/21/2023   HCT 41.3 07/21/2023   MCV 96 07/21/2023   PLT 294 07/21/2023   NEUTROABS 8.4 (H) 12/20/2015    CMP  Lab Results  Component Value Date   NA 139 07/21/2023   K 5.0 07/21/2023   CL 103 07/21/2023   CO2 20 07/21/2023   GLUCOSE 97 07/21/2023   BUN 21 07/21/2023   CREATININE 1.23 07/21/2023   CALCIUM  9.0 07/21/2023   PROT 6.7 01/26/2019   ALBUMIN  3.9 01/26/2019   AST 40 (H) 01/26/2019   ALT 32 01/26/2019   ALKPHOS 60 01/26/2019   BILITOT 0.5 01/26/2019   GFRNONAA 55 (L) 05/21/2022   GFRAA 65 06/07/2019    No results found for: CEA1, CEA, CAN199, CA125  Lab Results  Component Value Date   INR 1.0 06/09/2016   LABPROT 10.4 06/09/2016    Imaging:  No results found.  Medications: I have reviewed the patient's current medications.   Assessment/Plan: Leiomyosarcoma of the anterior chest wall, status post an excisional biopsy on 11/11/2015 confirming a leiomyosarcoma Positive lateral surgical margin, 2 x  0.6 cm, up to 5 mitoses per 10 high-powered fields Reexcision 12/23/2015-no leiomyosarcoma, negative resection margins    2.   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   History of coronary artery disease/myocardial infarction-status post coronary artery bypass surgery in 2007, status post placement of a RCA stent May 2018    4.   Hyperlipidemia   5.  Left high anterior cervical node first noted December 2019       Disposition: Mr Kuzel is in clinical remission from the leiomyosarcoma and lymphoma.  He would like to continue follow-up at the cancer center.  He will return for an office visit in 1 year.  I am available to see him in the interim as needed.  He will seek medical attention for recurrent episodes of choking .  I recommended he take small bites when eating apples.  Arley Hof, MD  12/13/2023  9:06 AM

## 2023-12-14 ENCOUNTER — Ambulatory Visit: Payer: Self-pay | Admitting: Physician Assistant

## 2023-12-14 DIAGNOSIS — I4729 Other ventricular tachycardia: Secondary | ICD-10-CM

## 2023-12-14 DIAGNOSIS — Z23 Encounter for immunization: Secondary | ICD-10-CM | POA: Diagnosis not present

## 2023-12-14 NOTE — Progress Notes (Signed)
 Dr. Verlin, please review. Patient continue to have ventricular ectopy with PVC burden of 4.8% and NSVT. This is after addition of metoprolol  succinate. History of complex coronary anatomy not amenable to PCI and declined CABG. Fortunately NSVT episodes are transient, any need for low dose amiodarone? History of COPD/emphysema. Scheduled to see you in 1 month.

## 2023-12-20 NOTE — Progress Notes (Signed)
 Reviewed with Dr. Verlin, still has some nonsustained VT, will refer to EP service

## 2023-12-21 ENCOUNTER — Telehealth: Payer: Self-pay | Admitting: *Deleted

## 2023-12-21 NOTE — Telephone Encounter (Signed)
 Spoke with pt regarding monitor results. Pt verbalized understanding and referral sent to EP. Pt had no further questions at this time

## 2023-12-21 NOTE — Telephone Encounter (Signed)
 Notified him that Dr. Cloretta had forgotten to look at the scar he was concerned about at anterior chest wall. Dr. Cloretta requested he drop by any M-Th between [phone] to allow him to check it and time he is ready.

## 2023-12-21 NOTE — Telephone Encounter (Signed)
 Patient returned staff call regarding results.

## 2023-12-21 NOTE — Telephone Encounter (Signed)
 LVM asking pt to call our office to discuss monitor results. 1st attempt

## 2023-12-23 DIAGNOSIS — L720 Epidermal cyst: Secondary | ICD-10-CM | POA: Diagnosis not present

## 2023-12-23 DIAGNOSIS — Z85828 Personal history of other malignant neoplasm of skin: Secondary | ICD-10-CM | POA: Diagnosis not present

## 2023-12-29 ENCOUNTER — Other Ambulatory Visit: Payer: Self-pay

## 2023-12-31 ENCOUNTER — Encounter: Payer: Self-pay | Admitting: Cardiovascular Disease

## 2023-12-31 MED ORDER — EZETIMIBE 10 MG PO TABS
10.0000 mg | ORAL_TABLET | Freq: Every day | ORAL | 3 refills | Status: AC
Start: 2023-12-31 — End: ?

## 2024-01-03 LAB — LAB REPORT - SCANNED: EGFR (Non-African Amer.): 53.3

## 2024-01-10 NOTE — Telephone Encounter (Signed)
 I spoke with patient and gave him message from Dr Verlin.  Patient does not want to see pharmacist at this time

## 2024-01-13 DIAGNOSIS — I10 Essential (primary) hypertension: Secondary | ICD-10-CM | POA: Diagnosis not present

## 2024-01-13 DIAGNOSIS — R82998 Other abnormal findings in urine: Secondary | ICD-10-CM | POA: Diagnosis not present

## 2024-01-19 IMAGING — CR DG CHEST 2V
3 series · 3 of 3 positions shown · non-contrast
Comparison: Radiograph 06/12/2027

CLINICAL DATA: Shortness of breath, cough

EXAM:
CHEST - 2 VIEW

[w chest pa]
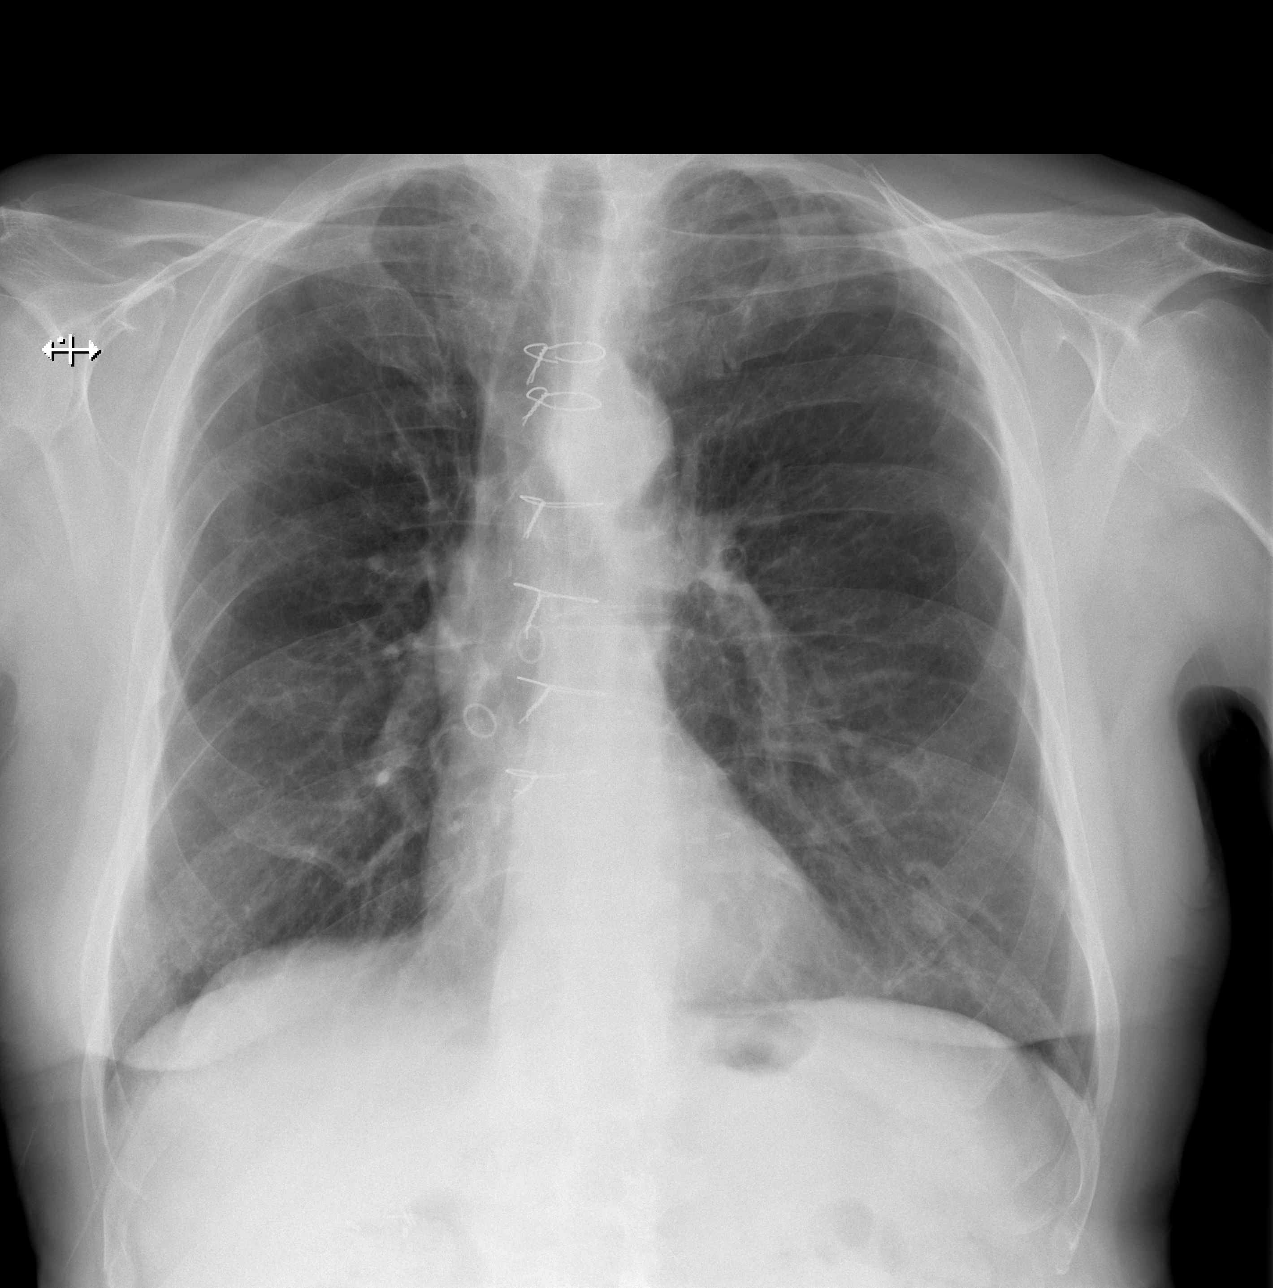

[w chest lat (1 of 2)]
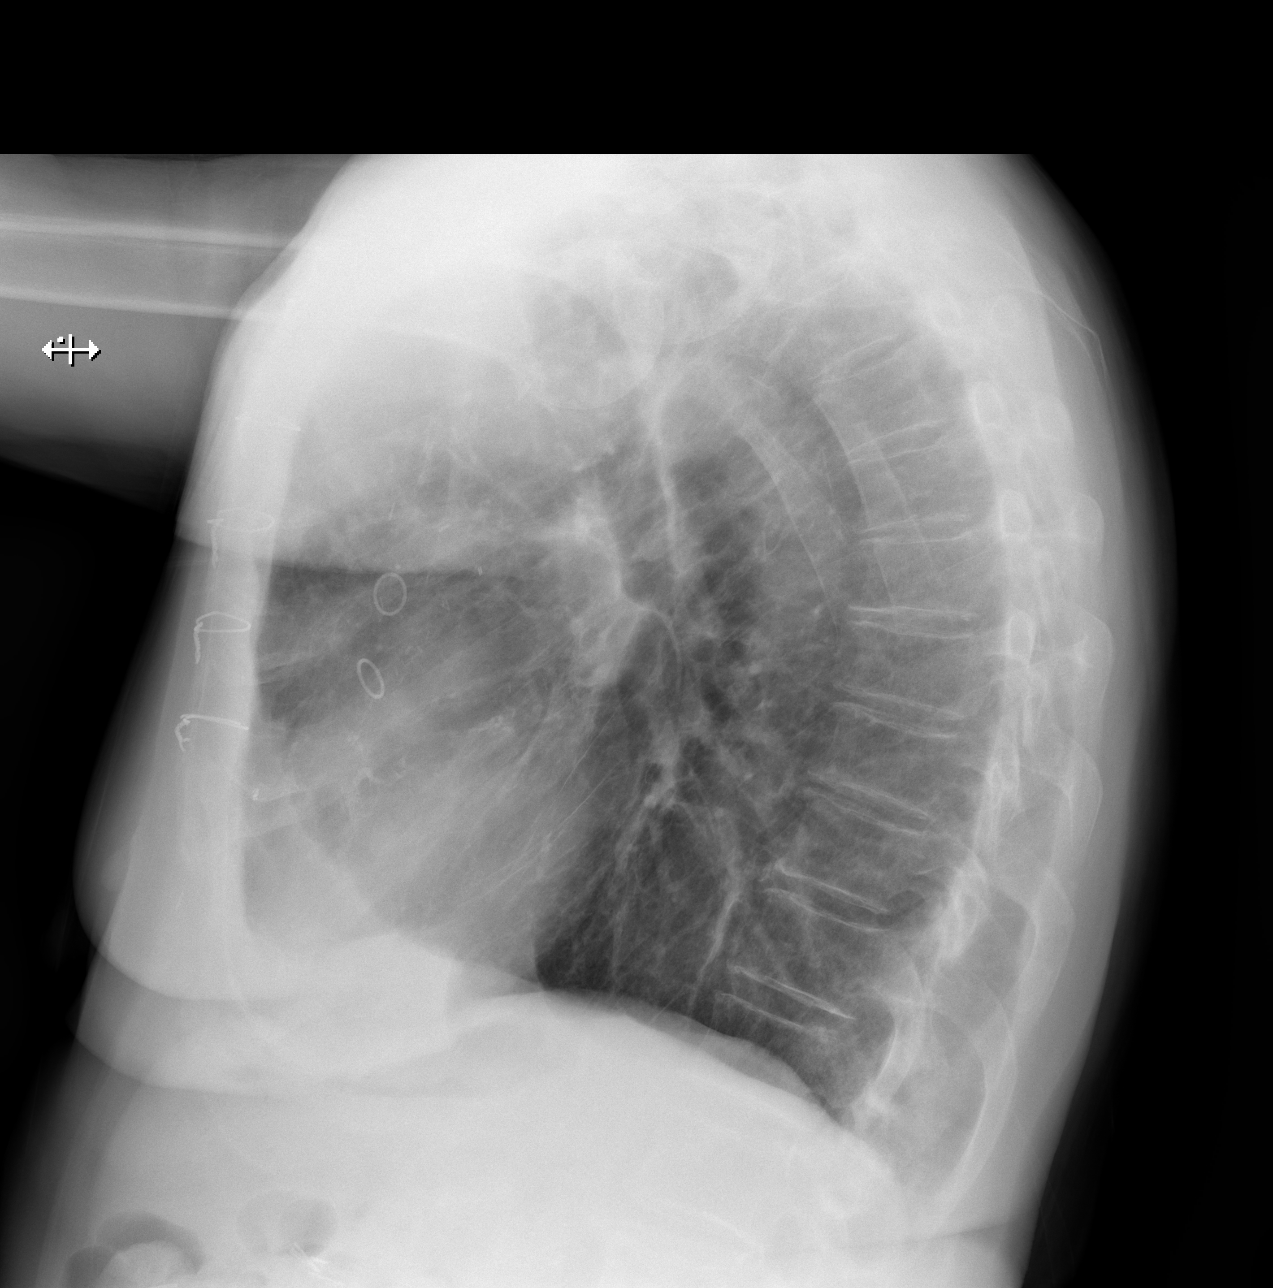

[w chest lat (2 of 2)]
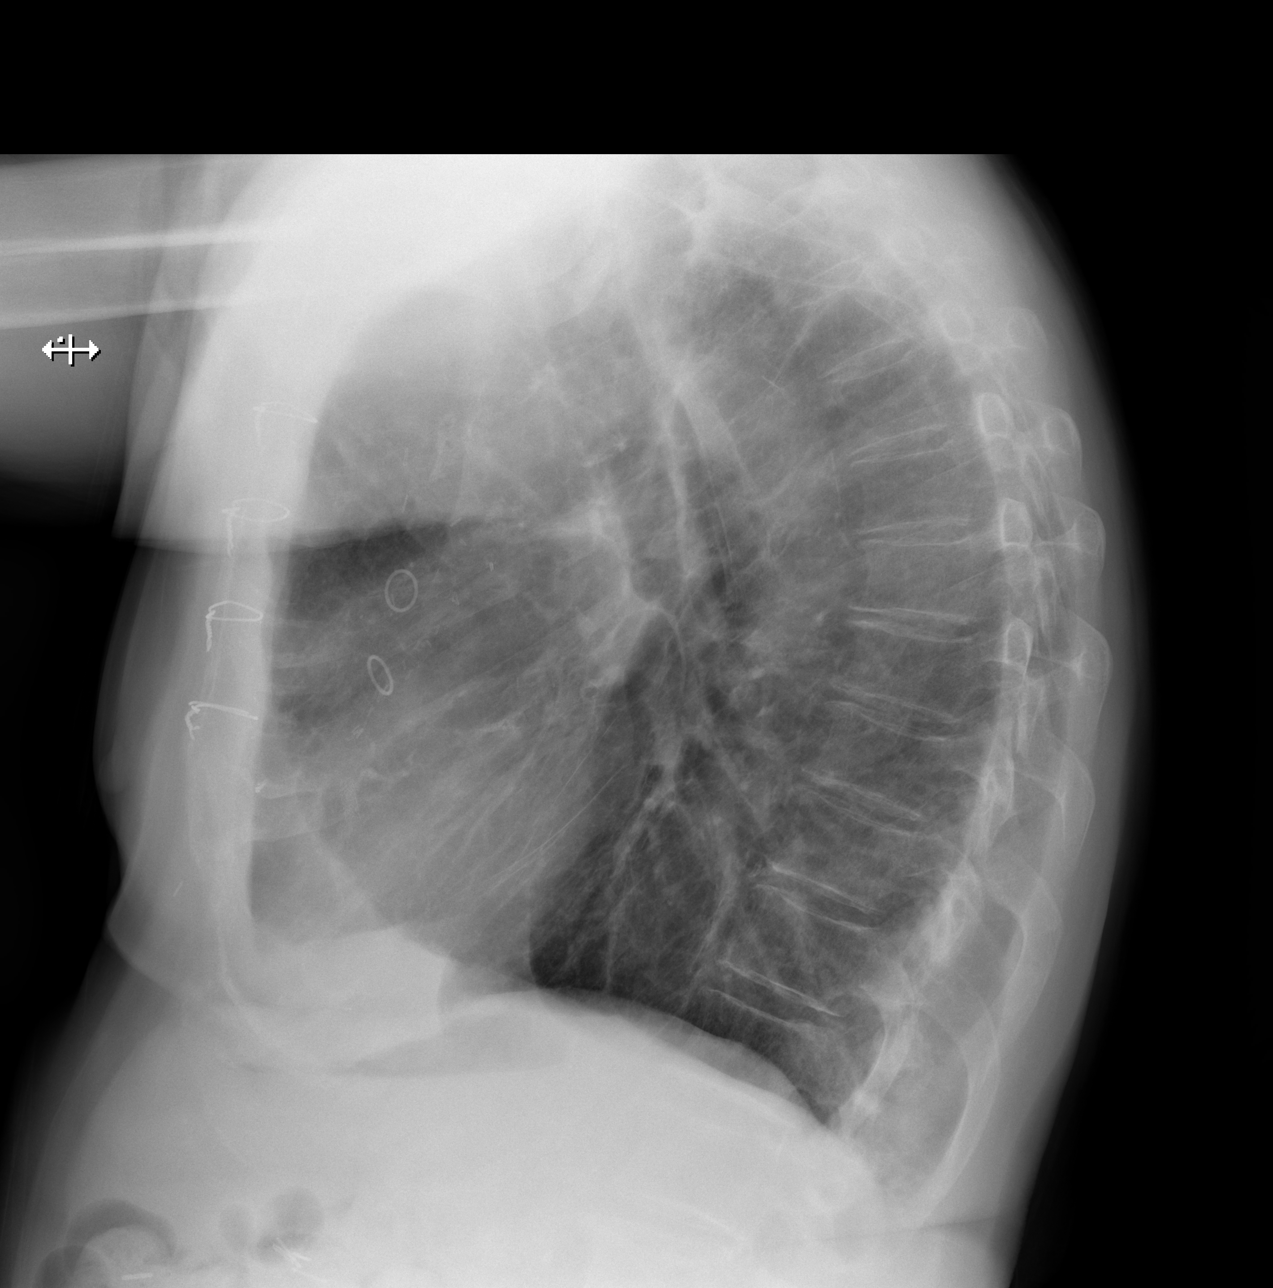

[3 of 3 positions shown; findings below may reference images not displayed]

FINDINGS: Unchanged cardiomediastinal silhouette. Prior median sternotomy and
CABG. There is no focal airspace consolidation. Bilateral nipple
shadows are seen. There is no large pleural effusion. Pulmonary
hyperinflation. Biapical pleuroparenchymal scarring. No
pneumothorax. No acute osseous abnormality.
IMPRESSION: No evidence of acute cardiopulmonary disease.  Findings of COPD.

## 2024-01-25 ENCOUNTER — Ambulatory Visit: Attending: Cardiovascular Disease | Admitting: Cardiovascular Disease

## 2024-01-25 NOTE — Progress Notes (Deleted)
 No chief complaint on file.   History of Present Illness: 78 yo male with history of CAD s/p CABG 2007, Non-hodgkins Lymphoma, ischemic cardiomyopathy and HLD here today for cardiac follow up. He had a 4V CABG in 2007. Cath in 2010 showed occlusion of all vein grafts and patent LIMA to LAD. Echo April 2018 with LVEF=40-45% and hypokinesis of the anteroseptal wall and apex. Nuclear stress test April 2018 with possible ischemia. Cardiac cath May 2018 with patent LIMA to LAD, all other grafts known to be occluded. Severe stenosis in the proximal RCA treated with a drug eluting stent. He was seen in our office August 2019 with c/o dyspnea with exertion. Cardiac monitor with sinus rhythm, rare PVCs, rare PACs. Nuclear stress test September 2019 without ischemia. Echo September 2019 with LVEF=40%, trivial MR. He was tried on Cozaar  in October 2019 but did not tolerate due to dizziness and presumed hypotension.  He was seen in April 2021 and reported dyspnea with exertion. Cardiac cath 06/14/19 showed patent LIMA to LAD with known occlusion of the mid LAD, patent RCA stent. There was a moderate left main stenosis and severe proximal Circumflex stenosis in a moderate caliber vessel. I attempted PCI of the Circumflex which was difficult. Balloon angioplasty of the ostial Circumflex with no stent placement as I could not effectively expand the lesion due to tortuosity and calcification. The vessel was not felt to be favorable for further attempts at PCI given difficult anatomy. Plans for medical management of CAD. He has started cyclodextrin which he found online with independent research and is hopeful that this will reverse some of his CAD. This is approved for use in [place]. Echo June 2023 with LVEF=55-60%. No valve disease. He continued to have dyspnea and was seen by Pulmonary and was felt to have mild COD and sleep apnea. Repeat cardiac cath in January 2024 with no change in coronary anatomy, only patent graft  was the LIMA to LAD. His left main and Circumflex are not amenable to PCI. He was referred to see Dr. Kerrin in CT surgery to discuss redo bypass. He elected not to proceed with bypass. He was seen in June 2025 with c/o fatigue. Echo August 2025 with LVEF=45-50%, trivial MR. Cardiac monitor July 2025 with sinus, PVCs, short runs of VT and SVT. His beta blocker was increased. Repeat cardiac monitor November 2025 with sinus, 12 runs of VT (longest 9 beats), 2 runs of SVT (longest 14 beats), 4.8% PVC burden. Pro BNP normal in September 2025.   He is here today for follow up. He continues to have dyspnea. He denies any chest pain, palpitations, lower extremity edema, orthopnea, PND, dizziness, near syncope or syncope.   Primary Care Physician: Yolande Toribio MATSU, MD  Past Medical History:  Diagnosis Date   Allergic rhinitis    grass, dust   Buttock pain    CAD (coronary artery disease)    Last cath 2010 per Dr. Tisa with diffuse multivessel disease, small caliber vessels not felt to be suitable for PCI   Chronic left hip pain    Colon polyps    pt says a colonoscopy did not confirm this, said there was nothing there   Diverticulosis of sigmoid colon 2010   colonoscopy - [facility]   Dizziness    1 episode    Fibromyalgia    GERD (gastroesophageal reflux disease)    Head pain    not chronic; I have myofasial tightening in my head that causes  pain in my skull (06/17/2016)   HLD (hyperlipidemia)    Hypertension    Leiomyosarcoma of chest wall (HCC) 11/2015   surgical excision alone recommended (per notes from Dr. Arnita office)   Myeloma [facility])    found it in my chest when they did the excision   Myocardial infarction (HCC) 06/2005   Non Hodgkin's lymphoma (HCC) dx'd 06/2005    Past Surgical History:  Procedure Laterality Date   BALLOON SINUPLASTY  2014   CARDIAC CATHETERIZATION  ~ 2008   Dr. Tisa   CATARACT EXTRACTION W/ INTRAOCULAR LENS IMPLANT Right    colonscopy   2010   CORONARY ANGIOPLASTY WITH STENT PLACEMENT  06/17/2016   CORONARY ARTERY BYPASS GRAFT  5/07   x5. LV dysfunction.    CORONARY BALLOON ANGIOPLASTY N/A 06/14/2019   Procedure: CORONARY BALLOON ANGIOPLASTY;  Surgeon: Verlin Lonni BIRCH, MD;  Location: [facility];  Service: Cardiovascular;  Laterality: N/A;   CORONARY STENT INTERVENTION N/A 06/17/2016   Procedure: Coronary Stent Intervention;  Surgeon: Verlin Lonni BIRCH, MD;  Location: [facility];  Service: Cardiovascular;  Laterality: N/A;   epidural steroid injections     INGUINAL HERNIA REPAIR Bilateral 06/01/2022   Procedure: LAPAROSCOPIC BILATERAL INGUINAL HERNIA REPAIR WITH MESH;  Surgeon: Stechschulte, Deward PARAS, MD;  Location: [facility];  Service: General;  Laterality: Bilateral;   LAPAROSCOPIC CHOLECYSTECTOMY  1996   LEFT HEART CATH AND CORS/GRAFTS ANGIOGRAPHY N/A 06/17/2016   Procedure: Left Heart Cath and Cors/Grafts Angiography;  Surgeon: Verlin Lonni BIRCH, MD;  Location: [facility];  Service: Cardiovascular;  Laterality: N/A;   LEFT HEART CATH AND CORS/GRAFTS ANGIOGRAPHY N/A 06/14/2019   Procedure: LEFT HEART CATH AND CORS/GRAFTS ANGIOGRAPHY;  Surgeon: Verlin Lonni BIRCH, MD;  Location: [facility];  Service: Cardiovascular;  Laterality: N/A;   LEFT HEART CATH AND CORS/GRAFTS ANGIOGRAPHY N/A 02/26/2022   Procedure: LEFT HEART CATH AND CORS/GRAFTS ANGIOGRAPHY;  Surgeon: Verlin Lonni BIRCH, MD;  Location: [facility];  Service: Cardiovascular;  Laterality: N/A;   MASS EXCISION N/A 11/11/2015   Procedure: EXCISION OF MID CHEST  MASS;  Surgeon: Lynwood Pina, MD;  Location: [facility];  Service: General;  Laterality: N/A;   MASS EXCISION N/A 12/23/2015   Procedure: REEXCISION OF CHEST WALL TUMOR SITE;  Surgeon: Lynwood Pina, MD;  Location: [facility];  Service: General;  Laterality: N/A;   POPLITEAL SYNOVIAL CYST EXCISION Right    TONSILLECTOMY  1952   &  Adenoidectomy   UMBILICAL HERNIA REPAIR N/A 06/01/2022   Procedure: OPEN PRIMARY UMBILICAL HERNIA REPAIR;  Surgeon: Lyndel Deward PARAS, MD;  Location: [facility];  Service: General;  Laterality: N/A;    Current Outpatient Medications  Medication Sig Dispense Refill   acetaminophen  (TYLENOL ) 500 MG tablet Take 500-1,000 mg by mouth every 6 (six) hours as needed (pain.).     albuterol  (PROVENTIL ) (2.5 MG/3ML) 0.083% nebulizer solution Take 3 mLs (2.5 mg total) by nebulization every 6 (six) hours as needed for wheezing or shortness of breath. (Patient not taking: Reported on 12/13/2023) 75 mL 12   albuterol  (VENTOLIN  HFA) 108 (90 Base) MCG/ACT inhaler Inhale into the lungs every 6 (six) hours as needed for wheezing or shortness of breath. (Patient not taking: Reported on 12/13/2023)     amLODipine (NORVASC) 5 MG tablet Take 5 mg by mouth at bedtime.     aspirin  EC 81 MG tablet Take 81 mg by mouth at bedtime.  azelastine  (ASTELIN ) 0.1 % nasal spray Place 1 spray into both nostrils 2 (two) times daily. Use in each nostril as directed 30 mL 12   budesonide-glycopyrrolate -formoterol (BREZTRI  AEROSPHERE) 160-9-4.8 MCG/ACT AERO inhaler Inhale 2 puffs into the lungs in the morning and at bedtime. 10.7 g 12   cholecalciferol  (VITAMIN D ) 1000 units tablet Take 2,000 Units by mouth daily with breakfast.     clopidogrel  (PLAVIX ) 75 MG tablet Take 1 tablet (75 mg total) by mouth daily. 90 tablet 3   Coenzyme Q10 300 MG CAPS Take 300 mg by mouth daily.     cyclobenzaprine  (FLEXERIL ) 10 MG tablet Take 10 mg by mouth 2 (two) times daily.     ezetimibe  (ZETIA ) 10 MG tablet Take 1 tablet (10 mg total) by mouth daily. 90 tablet 3   fluticasone  (FLONASE ) 50 MCG/ACT nasal spray Place 1 spray into both nostrils daily as needed for rhinitis or allergies.     isosorbide  mononitrate (IMDUR ) 30 MG 24 hr tablet Take 1 tablet (30 mg total) by mouth daily. Please schedule yearly appointment for future refills. 1st attempt. Thank  you 45 tablet 0   metoprolol  succinate (TOPROL -XL) 25 MG 24 hr tablet Take 1 tablet (25 mg total) by mouth in the morning and at bedtime. 180 tablet 3   mometasone (ELOCON) 0.1 % cream Apply 1 Application topically as needed.     naltrexone  (DEPADE) 50 MG tablet Take 50 mg by mouth 2 (two) times daily.     nitroGLYCERIN  (NITROSTAT ) 0.4 MG SL tablet Place 1 tablet (0.4 mg total) under the tongue every 5 (five) minutes as needed for chest pain. (Patient not taking: Reported on 12/13/2023) 25 tablet 3   rosuvastatin  (CRESTOR ) 40 MG tablet Take 1 tablet (40 mg total) by mouth daily. 90 tablet 0   Spacer/Aero-Holding Chambers (AEROCHAMBER MV) inhaler Use as instructed (Patient not taking: Reported on 12/13/2023) 1 each 0   traMADol  (ULTRAM ) 50 MG tablet Take 50 mg by mouth every 12 (twelve) hours as needed for moderate pain (pain score 4-6).     No current facility-administered medications for this visit.    Allergies  Allergen Reactions   Niacin And Related Nausea And Vomiting   Niaspan [Niacin Er (Antihyperlipidemic)] Nausea And Vomiting and Other (See Comments)    Stomach intolerance   Ramipril     Other Reaction(s): cough    Social History   Socioeconomic History   Marital status: Married    Spouse name: Elvie   Number of children: 2   Years of education: college   Highest education level: Associate degree: academic program  Occupational History   Occupation: retired  Tobacco Use   Smoking status: Former    Current packs/day: 0.00    Average packs/day: 2.0 packs/day for 30.0 years (60.0 ttl pk-yrs)    Types: Cigarettes    Start date: 05/17/1970    Quit date: 05/16/2000    Years since quitting: 23.7   Smokeless tobacco: Never   Tobacco comments:    quit 06/2005  Vaping Use   Vaping status: Never Used  Substance and Sexual Activity   Alcohol use: Not Currently   Drug use: No   Sexual activity: Not on file  Other Topics Concern   Not on file  Social History Narrative    Married, 2 children.   Runs an executive recruiting company.    Left-handed.   Caffeine: iced tea, green tea; tea daily    Social Drivers of Health   Tobacco  Use: Medium Risk (10/21/2023)   Patient History    Smoking Tobacco Use: Former    Smokeless Tobacco Use: Never    Passive Exposure: Not on Actuary Strain: Not on file  Food Insecurity: Not on file  Transportation Needs: Not on file  Physical Activity: Not on file  Stress: Not on file  Social Connections: Not on file  Intimate Partner Violence: Not on file  Depression (PHQ2-9): Low Risk (12/13/2023)   Depression (PHQ2-9)    PHQ-2 Score: 0  Alcohol Screen: Not on file  Housing: Not on file  Utilities: Not on file  Health Literacy: Not on file    Family History  Problem Relation Age of Onset   Heart disease Father    Heart attack Father    Congestive Heart Failure Father    COPD Father    Dementia Mother    Cancer Paternal Grandfather        ? type    Review of Systems:  As stated in the HPI and otherwise negative.   There were no vitals taken for this visit.  Physical Examination: General: Well developed, well nourished, NAD  HEENT: OP clear, mucus membranes moist  SKIN: warm, dry. No rashes. Neuro: No focal deficits  Musculoskeletal: Muscle strength 5/5 all ext  Psychiatric: Mood and affect normal  Neck: No JVD, no carotid bruits, no thyromegaly, no lymphadenopathy.  Lungs:Clear bilaterally, no wheezes, rhonci, crackles Cardiovascular: Regular rate and rhythm. No murmurs, gallops or rubs. Abdomen:Soft. Bowel sounds present. Non-tender.  Extremities: No lower extremity edema. Pulses are 2 + in the bilateral DP/PT.  EKG:  EKG is not *** ordered today. The ekg ordered today demonstrates   Recent Labs: 07/21/2023: BUN 21; Creatinine, Ser 1.23; Hemoglobin 13.0; Platelets 294; Potassium 5.0; Sodium 139; TSH 2.840 10/21/2023: NT-Pro BNP 299   Lipid Panel Followed in primary care   Wt  Readings from Last 3 Encounters:  12/13/23 202 lb 9.6 oz (91.9 kg)  12/07/23 203 lb 0.7 oz (92.1 kg)  11/23/23 203 lb 4.2 oz (92.2 kg)    Assessment and Plan:   1. CAD s/p CABG with stable angina: He has extensive CAD with no good targets for PCI by cath in 2024. He was seen by Dr. Kerrin in the CT surgery office to discuss redo CABG but the patient elected not to proceed with this. His dyspnea is felt to be due to his chronic lung disease.  -Continue ASA, Plavix , Imdur , statin and beta blocker.   2. Hyperlipidemia: Lipids followed in primary care. LDL ***. Continue statin and Zetia .    3. Ischemic cardiomyopathy: LVEF=45-50% by echo in August 2025. He did not tolerate low dose Cozaar  due to hypotension and dizziness. I suspect that he will not tolerate afterload reducing agents.  -Continue beta blocker.   4. PVCs: ***  Labs/ tests ordered today include:  No orders of the defined types were placed in this encounter.  Disposition:   F/U with me in one year  Signed, [name], MD 01/25/2024 5:41 AM    [facility] [address] [place], [place], [place]  [address] Phone: [phone]; Fax: [phone]

## 2024-01-28 ENCOUNTER — Encounter: Payer: Self-pay | Admitting: Cardiovascular Disease

## 2024-02-01 ENCOUNTER — Ambulatory Visit: Attending: Cardiovascular Disease | Admitting: Cardiovascular Disease

## 2024-02-01 ENCOUNTER — Encounter: Payer: Self-pay | Admitting: Cardiovascular Disease

## 2024-02-01 VITALS — BP 104/60 | HR 86 | Ht 71.0 in | Wt 206.0 lb

## 2024-02-01 DIAGNOSIS — I493 Ventricular premature depolarization: Secondary | ICD-10-CM | POA: Diagnosis not present

## 2024-02-01 DIAGNOSIS — I4729 Other ventricular tachycardia: Secondary | ICD-10-CM | POA: Diagnosis not present

## 2024-02-01 NOTE — Patient Instructions (Signed)
 Medication Instructions:  Your physician recommends that you continue on your current medications as directed. Please refer to the Current Medication list given to you today.  *If you need a refill on your cardiac medications before your next appointment, please call your pharmacy*  Lab Work: none If you have labs (blood work) drawn today and your tests are completely normal, you will receive your results only by: MyChart Message (if you have MyChart) OR A paper copy in the mail If you have any lab test that is abnormal or we need to change your treatment, we will call you to review the results.  Testing/Procedures: none  Follow-Up: At [facility], you and your health needs are our priority.  As part of our continuing mission to provide you with exceptional heart care, our providers are all part of one team.  This team includes your primary Cardiologist (physician) and Advanced Practice Providers or APPs (Physician Assistants and Nurse Practitioners) who all work together to provide you with the care you need, when you need it.  Your next appointment:   As needed  Provider:   Dr Nancey If no MD populates, click here to update Cardiologist or EP   DO NOT delete brackets or number around this link :1}   We recommend signing up for the patient portal called MyChart.  Sign up information is provided on this After Visit Summary.  MyChart is used to connect with patients for Virtual Visits (Telemedicine).  Patients are able to view lab/test results, encounter notes, upcoming appointments, etc.  Non-urgent messages can be sent to your provider as well.   To learn more about what you can do with MyChart, go to [email or web address].   Other Instructions

## 2024-02-01 NOTE — Progress Notes (Signed)
 " Electrophysiology Office Note:    Date:  02/01/2024   ID:  MARGUIS MATHIESON, DOB 1945-10-08, MRN [id number]  PCP:  Yolande Toribio MATSU, MD   [facility] Providers Cardiologist:  [name], MD Electrophysiologist:  Eulas FORBES Furbish, MD     Referring MD: Janene Boer, [place]   History of Present Illness:    Joseph Soto is a 78 y.o. male with a medical history significant for CAD status post CABG, COPD, asthma referred for arrhythmia management.      Discussed the use of AI scribe software for clinical note transcription with the patient, who gave verbal consent to proceed.  History of Present Illness BRAN ALDRIDGE Joseph Soto is a 78 year old male with triple vessel coronary disease who presents with concerns about premature ventricular contractions (PVCs).  He has severe triple vessel coronary disease treated with four vessel CABG, with only one graft patent on coronary angiogram from February 26, 2022. He declined further CABG.  He has very rare chest pain, with one episode the night before this visit described as high in the chest. Prior event monitoring captured two symptom episodes including chest discomfort that did not correlate with the PVCs of concern.  Ambulatory monitoring showed PVCs comprising about five percent of beats. He does not perceive skipped beats or palpitations. A funny beat was observed during pulmonary rehab without associated symptoms.  He is not aware of PVC episodes and has no associated lightheadedness or dizziness.  He takes metoprolol .         Today, he reports that he feels well and has no complaints  EKGs/Labs/Other Studies Reviewed Today:     Echocardiogram:  TTE September 13, 2023 LVEF 45 to 50%.  Regional wall motion abnormalities present.  Grade 1 diastolic dysfunction.   Monitors:  10 day monitor November 2025-- my interpretation Sinus rhythm predominates, heart rate 6211 beats minute, average 79 bpm 1.4% PACs,  4.8% PVCs.  The longest episode of nonsustained VT was 3.6 seconds.  There were 12 runs over the course of the 10 days.   Cardiac catherization  Coronary angiogram February 26, 2022 Reviewed in epic --three-vessel CAD with 1 out of 4 patent bypass grafts  EKG:   EKG Interpretation Date/Time:  Tuesday February 01 2024 09:12:44 EST Ventricular Rate:  86 PR Interval:  154 QRS Duration:  148 QT Interval:  404 QTC Calculation: 483 R Axis:   9  Text Interpretation: Normal sinus rhythm Right bundle branch block Septal infarct (cited on or before 21-Jul-2023) When compared with ECG of 21-Jul-2023 11:23, No significant change was found Confirmed by Furbish Eulas [phone]) on 02/01/2024 9:33:00 AM     Physical Exam:    VS:  BP 104/60 (BP Location: Right Arm, Patient Position: Sitting, Cuff Size: Large)   Pulse 86   Ht 5' 11 (1.803 m)   Wt 206 lb (93.4 kg)   SpO2 95%   BMI 28.73 kg/m     Wt Readings from Last 3 Encounters:  02/01/24 206 lb (93.4 kg)  12/13/23 202 lb 9.6 oz (91.9 kg)  12/07/23 203 lb 0.7 oz (92.1 kg)     GEN: Well nourished, well developed in no acute distress CARDIAC: RRR, no murmurs, rubs, gallops RESPIRATORY:  Normal work of breathing MUSCULOSKELETAL: no edema    ASSESSMENT & PLAN:     Occasional ventricular ectopy, nonsustained VT Asymptomatic The longest detected nonsustained VT was less than 4 seconds He is not a candidate for intermittent drugs  other than perhaps amiodarone, which may have some benefit in decreasing arrhythmic death but has no net mortality benefit I explained possible arrhythmia symptoms to the patient including lightheadedness, dizziness, presyncope and urged him to inform us  if he begins to have any of the symptoms, even if there is subtle We could consider recorder placement for additional monitoring in the future  Coronary artery disease With some stable angina Follow with Dr. Verlin He is not a candidate for PCI, and has  declined repeat CABG      Signed, Eulas FORBES Furbish, MD  02/01/2024 9:45 AM    [facility] "

## 2024-03-06 ENCOUNTER — Ambulatory Visit: Admitting: Cardiovascular Disease

## 2024-03-10 ENCOUNTER — Encounter: Payer: Self-pay | Admitting: Cardiovascular Disease

## 2024-03-10 ENCOUNTER — Ambulatory Visit: Attending: Cardiovascular Disease | Admitting: Cardiovascular Disease

## 2024-03-10 VITALS — BP 112/62 | HR 83 | Ht 71.0 in | Wt 201.0 lb

## 2024-03-10 DIAGNOSIS — I493 Ventricular premature depolarization: Secondary | ICD-10-CM

## 2024-03-10 DIAGNOSIS — I4729 Other ventricular tachycardia: Secondary | ICD-10-CM

## 2024-03-10 DIAGNOSIS — E7800 Pure hypercholesterolemia, unspecified: Secondary | ICD-10-CM

## 2024-03-10 DIAGNOSIS — I25810 Atherosclerosis of coronary artery bypass graft(s) without angina pectoris: Secondary | ICD-10-CM | POA: Diagnosis not present

## 2024-03-10 DIAGNOSIS — I255 Ischemic cardiomyopathy: Secondary | ICD-10-CM | POA: Diagnosis not present

## 2024-03-10 NOTE — Progress Notes (Signed)
 "   Chief Complaint  Patient presents with   Follow-up    CAD    History of Present Illness: 79 yo male with history of CAD s/p CABG 2007, Non-hodgkins Lymphoma, ischemic cardiomyopathy, RBBB and HLD here today for cardiac follow up. He had a 4V CABG in 2007. Cath in 2010 showed occluded SVG to Diagonal and occluded SVG to PDA/PLA with patent LIMA graft to the  LAD. There was diffuse disease in distal vessels felt best to be managed medically. 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 Echo April 2018 with LVEF=40-45% and hypokinesis of the anteroseptal wall and apex. Nuclear stress test April 2018 with possible ischemia. Cardiac cath May 2018 with patent LIMA to LAD, all other grafts known to be occluded. Severe stenosis in the proximal RCA treated with a drug eluting stent. He was seen in our office August 2019 with c/o dyspnea with exertion. Cardiac monitor with sinus rhythm, rare PVCs, rare PACs. Nuclear stress test September 2019 without ischemia. Echo September 2019 with LVEF=40%, trivial MR. He was tried on Cozaar  in October 2019 but did not tolerate due to dizziness and presumed hypotension.  He was seen in April 2021 and reported dyspnea with exertion. Cardiac cath in 2021 with patent LIMA to LAD with known occlusion of the mid LAD, patent RCA stent. There was a moderate left main stenosis and severe proximal Circumflex stenosis in a moderate caliber vessel. I attempted PCI of the Circumflex which was difficult. Balloon angioplasty of the ostial Circumflex with no stent placement as I could not effectively expand the lesion due to tortuosity and calcification. The vessel was not felt to be favorable for further attempts at PCI given difficult anatomy. Plans for medical management of CAD. He also tried cyclodextrin which he found online with independent research and found that it may reverse some of his CAD. This is approved for use in [place]. Echo June 2023 with  LVEF=55-60%. No valve disease. He continued to have dyspnea and was seen by Pulmonary and was felt to have COPD and sleep apnea. Repeat cardiac cath in January 2024 with no change in coronary anatomy, only patent graft was the LIMA to LAD. His left main and Circumflex are not amenable to PCI. He was referred to see Dr. Kerrin in CT surgery to discuss redo bypass. He elected not to proceed with bypass. Echo August 2025 with LVEF=50%. Trivial MR. Cardiac monitor October 2025 with sinus, 4.8% PVC burden and runs of non-sustained VT. He was seen in the EP clinic in December 2025 by Dr. Nancey and was recommendation was to continue metoprolol . His chronic dyspnea is felt to be related to his lung disease.   He is here today for follow up. The patient denies any chest pain, dyspnea, palpitations, lower extremity edema, orthopnea, PND, dizziness, near syncope or syncope.   Primary Care Physician: Yolande Toribio MATSU, MD  Past Medical History:  Diagnosis Date   Allergic rhinitis    grass, dust   Buttock pain    CAD (coronary artery disease)    Last cath 2010 per Dr. 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 Arnita office)   Myeloma [facility])    found it in my chest when they did the excision   Myocardial infarction (HCC) 06/2005   Non Hodgkin's lymphoma (HCC) dx'd 06/2005    Past Surgical History:  Procedure Laterality Date    BALLOON SINUPLASTY  2014   CARDIAC CATHETERIZATION  ~ 2008   Dr. Tisa   CATARACT EXTRACTION W/ INTRAOCULAR LENS IMPLANT Right    colonscopy  2010   CORONARY ANGIOPLASTY WITH STENT PLACEMENT  06/17/2016   CORONARY ARTERY BYPASS GRAFT  5/07   x5. LV dysfunction.    CORONARY BALLOON ANGIOPLASTY N/A 06/14/2019   Procedure: CORONARY BALLOON ANGIOPLASTY;  Surgeon: Verlin Lonni BIRCH, MD;  Location: [facility];  Service: Cardiovascular;  Laterality: N/A;   CORONARY STENT INTERVENTION N/A 06/17/2016   Procedure: Coronary Stent Intervention;  Surgeon: Verlin Lonni BIRCH, MD;  Location: [facility];  Service: Cardiovascular;  Laterality: N/A;   epidural steroid injections     INGUINAL HERNIA REPAIR Bilateral 06/01/2022   Procedure: LAPAROSCOPIC BILATERAL INGUINAL HERNIA REPAIR WITH MESH;  Surgeon: Stechschulte, Deward PARAS, MD;  Location: [facility];  Service: General;  Laterality: Bilateral;   LAPAROSCOPIC CHOLECYSTECTOMY  1996   LEFT HEART CATH AND CORS/GRAFTS ANGIOGRAPHY N/A 06/17/2016   Procedure: Left Heart Cath and Cors/Grafts Angiography;  Surgeon: Verlin Lonni BIRCH, MD;  Location: [facility];  Service: Cardiovascular;  Laterality: N/A;   LEFT HEART CATH AND CORS/GRAFTS ANGIOGRAPHY N/A 06/14/2019   Procedure: LEFT HEART CATH AND CORS/GRAFTS ANGIOGRAPHY;  Surgeon: Verlin Lonni BIRCH, MD;  Location: [facility];  Service: Cardiovascular;  Laterality: N/A;   LEFT HEART CATH AND CORS/GRAFTS ANGIOGRAPHY N/A 02/26/2022   Procedure: LEFT HEART CATH AND CORS/GRAFTS ANGIOGRAPHY;  Surgeon: Verlin Lonni BIRCH, MD;  Location: [facility];  Service: Cardiovascular;  Laterality: N/A;   MASS EXCISION N/A 11/11/2015   Procedure: EXCISION OF MID CHEST  MASS;  Surgeon: Lynwood Pina, MD;  Location: [facility];  Service: General;  Laterality: N/A;   MASS EXCISION N/A 12/23/2015   Procedure: REEXCISION OF CHEST WALL TUMOR SITE;  Surgeon: Lynwood Pina,  MD;  Location:  SURGERY CENTER;  Service: General;  Laterality: N/A;   POPLITEAL SYNOVIAL CYST EXCISION Right    TONSILLECTOMY  1952   & Adenoidectomy   UMBILICAL HERNIA REPAIR N/A 06/01/2022   Procedure: OPEN PRIMARY UMBILICAL HERNIA REPAIR;  Surgeon: Lyndel Deward PARAS, MD;  Location: [facility];  Service: General;  Laterality: N/A;    Current Outpatient Medications  Medication Sig Dispense Refill   acetaminophen  (TYLENOL ) 500 MG tablet Take 500-1,000 mg by mouth every 6 (six) hours as needed (pain.).     amLODipine (NORVASC) 5 MG tablet Take 5 mg by mouth at bedtime.     aspirin  EC 81 MG tablet Take 81 mg by mouth at bedtime.     azelastine  (ASTELIN ) 0.1 % nasal spray Place 1 spray into both nostrils 2 (two) times daily. Use in each nostril as directed 30 mL 12   budesonide-glycopyrrolate -formoterol (BREZTRI  AEROSPHERE) 160-9-4.8 MCG/ACT AERO inhaler Inhale 2 puffs into the lungs in the morning and  at bedtime. 10.7 g 12   cholecalciferol  (VITAMIN D ) 1000 units tablet Take 2,000 Units by mouth daily with breakfast.     clopidogrel  (PLAVIX ) 75 MG tablet Take 1 tablet (75 mg total) by mouth daily. 90 tablet 3   Coenzyme Q10 300 MG CAPS Take 300 mg by mouth daily.     cyclobenzaprine  (FLEXERIL ) 10 MG tablet Take 10 mg by mouth 2 (two) times daily.     ezetimibe  (ZETIA ) 10 MG tablet Take 1 tablet (10 mg total) by mouth daily. 90 tablet 3   ipratropium (ATROVENT ) 0.06 % nasal spray Place 1 spray into both nostrils 3 (three) times daily.     isosorbide  mononitrate (IMDUR ) 30 MG 24 hr tablet Take 1 tablet (30 mg total) by mouth daily. Please schedule yearly appointment for future refills. 1st attempt. Thank you 45 tablet 0   metoprolol  succinate (TOPROL -XL) 25 MG 24 hr tablet Take 1 tablet (25 mg total) by mouth in the morning and at bedtime. 180 tablet 3   naltrexone  (DEPADE) 50 MG tablet Take 50 mg by mouth 2 (two) times daily.     nitroGLYCERIN  (NITROSTAT ) 0.4 MG SL tablet Place 1 tablet  (0.4 mg total) under the tongue every 5 (five) minutes as needed for chest pain. 25 tablet 3   rosuvastatin  (CRESTOR ) 40 MG tablet Take 1 tablet (40 mg total) by mouth daily. 90 tablet 0   Spacer/Aero-Holding Chambers (AEROCHAMBER MV) inhaler Use as instructed 1 each 0   traMADol  (ULTRAM ) 50 MG tablet Take 50 mg by mouth every 12 (twelve) hours as needed for moderate pain (pain score 4-6).     albuterol  (PROVENTIL ) (2.5 MG/3ML) 0.083% nebulizer solution Take 3 mLs (2.5 mg total) by nebulization every 6 (six) hours as needed for wheezing or shortness of breath. (Patient not taking: Reported on 02/01/2024) 75 mL 12   albuterol  (VENTOLIN  HFA) 108 (90 Base) MCG/ACT inhaler Inhale into the lungs every 6 (six) hours as needed for wheezing or shortness of breath. (Patient not taking: Reported on 02/01/2024)     fluticasone  (FLONASE ) 50 MCG/ACT nasal spray Place 1 spray into both nostrils daily as needed for rhinitis or allergies. (Patient not taking: Reported on 02/01/2024)     mometasone (ELOCON) 0.1 % cream Apply 1 Application topically as needed. (Patient not taking: Reported on 03/10/2024)     No current facility-administered medications for this visit.    Allergies  Allergen Reactions   Niacin And Related Nausea And Vomiting   Niaspan [Niacin Er (Antihyperlipidemic)] Nausea And Vomiting and Other (See Comments)    Stomach intolerance   Ramipril     Other Reaction(s): cough    Social History   Socioeconomic History   Marital status: Married    Spouse name: Elvie   Number of children: 2   Years of education: college   Highest education level: Associate degree: academic program  Occupational History   Occupation: retired  Tobacco Use   Smoking status: Former    Current packs/day: 0.00    Average packs/day: 2.0 packs/day for 30.0 years (60.0 ttl pk-yrs)    Types: Cigarettes    Start date: 05/17/1970    Quit date: 05/16/2000    Years since quitting: 23.8   Smokeless tobacco: Never    Tobacco comments:    quit 06/2005  Vaping Use   Vaping status: Never Used  Substance and Sexual Activity   Alcohol use: Not Currently   Drug use: No   Sexual activity: Not on file  Other Topics Concern   Not on file  Social History Narrative   Married, 2 children.   Runs an executive recruiting company.    Left-handed.   Caffeine: iced tea, green tea; tea daily    Social Drivers of Health   Tobacco Use: Medium Risk (03/10/2024)   Patient History    Smoking Tobacco Use: Former    Smokeless Tobacco Use: Never    Passive Exposure: Not on Actuary Strain: Not on file  Food Insecurity: Not on file  Transportation Needs: Not on file  Physical Activity: Not on file  Stress: Not on file  Social Connections: Not on file  Intimate Partner Violence: Not on file  Depression (PHQ2-9): Low Risk (12/13/2023)   Depression (PHQ2-9)    PHQ-2 Score: 0  Alcohol Screen: Not on file  Housing: Not on file  Utilities: Not on file  Health Literacy: Not on file    Family History  Problem Relation Age of Onset   Heart disease Father    Heart attack Father    Congestive Heart Failure Father    COPD Father    Dementia Mother    Cancer Paternal Grandfather        ? type    Review of Systems:  As stated in the HPI and otherwise negative.   BP 112/62 (BP Location: Right Arm, Patient Position: Sitting, Cuff Size: Normal)   Pulse 83   Ht 5' 11 (1.803 m)   Wt 201 lb (91.2 kg)   SpO2 94%   BMI 28.03 kg/m   Physical Examination: General: Well developed, well nourished, NAD  SKIN: warm, dry. Neuro: No focal deficits  Psychiatric: Mood and affect normal  Neck: No JVD Lungs:Clear bilaterally, no wheezes, rhonci, crackles Cardiovascular: Regular rate and rhythm. No murmurs, gallops or rubs. Abdomen:Soft.  Extremities: No lower extremity edema.    EKG:  EKG is not ordered today. The ekg ordered today demonstrates   Recent Labs: 07/21/2023: BUN 21; Creatinine, Ser 1.23;  Hemoglobin 13.0; Platelets 294; Potassium 5.0; Sodium 139; TSH 2.840 10/21/2023: NT-Pro BNP 299   Lipid Panel Followed in primary care   Wt Readings from Last 3 Encounters:  03/10/24 201 lb (91.2 kg)  02/01/24 206 lb (93.4 kg)  12/13/23 202 lb 9.6 oz (91.9 kg)    Assessment and Plan:   1. CAD s/p CABG with stable angina: He has extensive CAD with no good targets for PCI by cath in 2024. He was seen by Dr. Kerrin in the CT surgery office to discuss redo CABG but he elected not to proceed with this. It is not clear that his dyspnea is related to his CAD. His dyspnea is most likely related to his underlying lung disease.  -Continue ASA, Plavix , Crestor , Zetia , Imdur  and Toprol    2. Hyperlipidemia: Lipids followed in primary care. LDL 51 in November 2024. He has plans for repeat blood work in primary care.  -Continue Crestor  and Zetia .    3. Ischemic cardiomyopathy: LV function low normal by echo in 2025. He did not tolerate low dose Cozaar  due to hypotension and dizziness. I suspect that he will not tolerate afterload reducing agents. -Continue Toprol    4. PVCs/NSVT: No palpitations.  -Continue Toprol  25 mg po BI\D  Labs/ tests ordered today include:  No orders of the defined types were placed in this encounter.  Disposition:   F/U with me in one year  Signed, [name], MD 03/10/2024 3:32 PM    [facility] [address] [place], [place], [place]  [address] Phone: [phone]; Fax: [phone]  "

## 2024-03-10 NOTE — Patient Instructions (Signed)

## 2024-12-12 ENCOUNTER — Inpatient Hospital Stay: Admitting: Oncology
